# Patient Record
Sex: Female | Born: 1935
Health system: Southern US, Community
[De-identification: ages and names within clinical notes are randomized; demographics above are authoritative.]

## PROBLEM LIST (undated history)

## (undated) DIAGNOSIS — N840 Polyp of corpus uteri: Secondary | ICD-10-CM

## (undated) DIAGNOSIS — M170 Bilateral primary osteoarthritis of knee: Secondary | ICD-10-CM

## (undated) DIAGNOSIS — M810 Age-related osteoporosis without current pathological fracture: Secondary | ICD-10-CM

## (undated) DIAGNOSIS — I872 Venous insufficiency (chronic) (peripheral): Secondary | ICD-10-CM

## (undated) DIAGNOSIS — E669 Obesity, unspecified: Secondary | ICD-10-CM

## (undated) DIAGNOSIS — I10 Essential (primary) hypertension: Secondary | ICD-10-CM

## (undated) DIAGNOSIS — E039 Hypothyroidism, unspecified: Secondary | ICD-10-CM

## (undated) DIAGNOSIS — J309 Allergic rhinitis, unspecified: Secondary | ICD-10-CM

## (undated) DIAGNOSIS — F419 Anxiety disorder, unspecified: Secondary | ICD-10-CM

## (undated) DIAGNOSIS — M21619 Bunion of unspecified foot: Secondary | ICD-10-CM

## (undated) DIAGNOSIS — H9190 Unspecified hearing loss, unspecified ear: Secondary | ICD-10-CM

## (undated) DIAGNOSIS — E7800 Pure hypercholesterolemia, unspecified: Secondary | ICD-10-CM

## (undated) DIAGNOSIS — M5090 Cervical disc disorder, unspecified, unspecified cervical region: Secondary | ICD-10-CM

## (undated) DIAGNOSIS — I38 Endocarditis, valve unspecified: Secondary | ICD-10-CM

## (undated) HISTORY — DX: Bunion of unspecified foot: M21.619

## (undated) HISTORY — DX: Venous insufficiency (chronic) (peripheral): I87.2

## (undated) HISTORY — DX: Unspecified hearing loss, unspecified ear: H91.90

## (undated) HISTORY — DX: Bilateral primary osteoarthritis of knee: M17.0

## (undated) HISTORY — DX: Anxiety disorder, unspecified: F41.9

## (undated) HISTORY — DX: Age-related osteoporosis without current pathological fracture: M81.0

## (undated) HISTORY — DX: Hypothyroidism, unspecified: E03.9

## (undated) HISTORY — DX: Obesity, unspecified: E66.9

## (undated) HISTORY — DX: Pure hypercholesterolemia, unspecified: E78.00

## (undated) HISTORY — DX: Essential (primary) hypertension: I10

## (undated) HISTORY — DX: Endocarditis, valve unspecified: I38

## (undated) HISTORY — DX: Polyp of corpus uteri: N84.0

## (undated) HISTORY — DX: Allergic rhinitis, unspecified: J30.9

## (undated) HISTORY — PX: HYSTEROSCOPY: SHX211

## (undated) HISTORY — DX: Cervical disc disorder, unspecified, unspecified cervical region: M50.90

---

## 1976-05-28 HISTORY — PX: NEPHRECTOMY LIVING DONOR: SUR877

## 1997-09-02 ENCOUNTER — Ambulatory Visit (HOSPITAL_COMMUNITY): Admission: RE | Admit: 1997-09-02 | Discharge: 1997-09-02 | Payer: Self-pay | Admitting: Internal Medicine

## 1997-11-18 ENCOUNTER — Other Ambulatory Visit: Admission: RE | Admit: 1997-11-18 | Discharge: 1997-11-18 | Payer: Self-pay | Admitting: Internal Medicine

## 1997-11-22 ENCOUNTER — Ambulatory Visit (HOSPITAL_COMMUNITY): Admission: RE | Admit: 1997-11-22 | Discharge: 1997-11-22 | Payer: Self-pay | Admitting: Internal Medicine

## 1999-02-08 ENCOUNTER — Ambulatory Visit (HOSPITAL_COMMUNITY): Admission: RE | Admit: 1999-02-08 | Discharge: 1999-02-08 | Payer: Self-pay | Admitting: Internal Medicine

## 1999-02-08 ENCOUNTER — Encounter: Payer: Self-pay | Admitting: Internal Medicine

## 2000-05-10 ENCOUNTER — Ambulatory Visit (HOSPITAL_COMMUNITY): Admission: RE | Admit: 2000-05-10 | Discharge: 2000-05-10 | Payer: Self-pay | Admitting: Internal Medicine

## 2000-05-10 ENCOUNTER — Encounter: Payer: Self-pay | Admitting: Internal Medicine

## 2001-07-01 ENCOUNTER — Encounter: Admission: RE | Admit: 2001-07-01 | Discharge: 2001-07-01 | Payer: Self-pay | Admitting: Internal Medicine

## 2001-07-01 ENCOUNTER — Encounter: Payer: Self-pay | Admitting: Internal Medicine

## 2002-08-04 ENCOUNTER — Other Ambulatory Visit: Admission: RE | Admit: 2002-08-04 | Discharge: 2002-08-04 | Payer: Self-pay | Admitting: Internal Medicine

## 2002-09-02 ENCOUNTER — Encounter: Payer: Self-pay | Admitting: Internal Medicine

## 2002-09-02 ENCOUNTER — Encounter: Admission: RE | Admit: 2002-09-02 | Discharge: 2002-09-02 | Payer: Self-pay | Admitting: Internal Medicine

## 2002-09-22 ENCOUNTER — Encounter: Payer: Self-pay | Admitting: Internal Medicine

## 2002-09-22 ENCOUNTER — Ambulatory Visit (HOSPITAL_COMMUNITY): Admission: RE | Admit: 2002-09-22 | Discharge: 2002-09-22 | Payer: Self-pay | Admitting: Internal Medicine

## 2004-04-04 ENCOUNTER — Other Ambulatory Visit: Admission: RE | Admit: 2004-04-04 | Discharge: 2004-04-04 | Payer: Self-pay | Admitting: Gynecology

## 2004-05-11 ENCOUNTER — Ambulatory Visit (HOSPITAL_COMMUNITY): Admission: RE | Admit: 2004-05-11 | Discharge: 2004-05-11 | Payer: Self-pay | Admitting: Internal Medicine

## 2004-05-19 ENCOUNTER — Encounter: Admission: RE | Admit: 2004-05-19 | Discharge: 2004-05-19 | Payer: Self-pay | Admitting: Internal Medicine

## 2004-08-23 ENCOUNTER — Encounter: Admission: RE | Admit: 2004-08-23 | Discharge: 2004-08-23 | Payer: Self-pay | Admitting: Internal Medicine

## 2005-04-05 ENCOUNTER — Ambulatory Visit (HOSPITAL_COMMUNITY): Admission: RE | Admit: 2005-04-05 | Discharge: 2005-04-05 | Payer: Self-pay | Admitting: Gynecology

## 2005-04-06 ENCOUNTER — Ambulatory Visit (HOSPITAL_COMMUNITY): Admission: RE | Admit: 2005-04-06 | Discharge: 2005-04-06 | Payer: Self-pay | Admitting: Cardiology

## 2005-04-12 ENCOUNTER — Inpatient Hospital Stay (HOSPITAL_BASED_OUTPATIENT_CLINIC_OR_DEPARTMENT_OTHER): Admission: RE | Admit: 2005-04-12 | Discharge: 2005-04-12 | Payer: Self-pay | Admitting: Cardiology

## 2005-05-15 ENCOUNTER — Encounter (INDEPENDENT_AMBULATORY_CARE_PROVIDER_SITE_OTHER): Payer: Self-pay | Admitting: Specialist

## 2005-05-15 ENCOUNTER — Ambulatory Visit (HOSPITAL_COMMUNITY): Admission: RE | Admit: 2005-05-15 | Discharge: 2005-05-15 | Payer: Self-pay | Admitting: Gynecology

## 2005-08-01 ENCOUNTER — Ambulatory Visit (HOSPITAL_COMMUNITY): Admission: RE | Admit: 2005-08-01 | Discharge: 2005-08-01 | Payer: Self-pay | Admitting: Internal Medicine

## 2006-08-06 ENCOUNTER — Ambulatory Visit (HOSPITAL_COMMUNITY): Admission: RE | Admit: 2006-08-06 | Discharge: 2006-08-06 | Payer: Self-pay | Admitting: Internal Medicine

## 2007-05-30 ENCOUNTER — Other Ambulatory Visit: Admission: RE | Admit: 2007-05-30 | Discharge: 2007-05-30 | Payer: Self-pay | Admitting: Gynecology

## 2007-10-16 ENCOUNTER — Ambulatory Visit (HOSPITAL_COMMUNITY): Admission: RE | Admit: 2007-10-16 | Discharge: 2007-10-16 | Payer: Self-pay | Admitting: Internal Medicine

## 2008-04-27 DIAGNOSIS — M8580 Other specified disorders of bone density and structure, unspecified site: Secondary | ICD-10-CM | POA: Insufficient documentation

## 2008-05-03 ENCOUNTER — Ambulatory Visit: Payer: Self-pay | Admitting: Gynecology

## 2008-11-30 ENCOUNTER — Ambulatory Visit (HOSPITAL_COMMUNITY): Admission: RE | Admit: 2008-11-30 | Discharge: 2008-11-30 | Payer: Self-pay | Admitting: Family Medicine

## 2009-01-10 ENCOUNTER — Encounter: Admission: RE | Admit: 2009-01-10 | Discharge: 2009-01-10 | Payer: Self-pay | Admitting: Family Medicine

## 2009-12-14 ENCOUNTER — Ambulatory Visit (HOSPITAL_COMMUNITY): Admission: RE | Admit: 2009-12-14 | Discharge: 2009-12-14 | Payer: Self-pay | Admitting: Family Medicine

## 2010-06-17 ENCOUNTER — Encounter: Payer: Self-pay | Admitting: Internal Medicine

## 2010-06-19 ENCOUNTER — Encounter: Payer: Self-pay | Admitting: Family Medicine

## 2010-10-13 NOTE — Op Note (Signed)
NAME:  Lynn Brown, Lynn Brown                ACCOUNT NO.:  [id number]   MEDICAL RECORD NO.:  [id number]          PATIENT TYPE:  AMB   LOCATION:  SDC                           FACILITY:  [facility]   PHYSICIAN:  Timothy P. Fontaine, M.D.DATE OF BIRTH:  09/03/1931   DATE OF PROCEDURE:  05/15/2005  DATE OF DISCHARGE:                                 OPERATIVE REPORT   PREOPERATIVE DIAGNOSES:  Endometrial polyp.   POSTOPERATIVE DIAGNOSES:  1.  Endometrial polyp.  2.  Submucous myoma x2.   PROCEDURE:  1.  Hysteroscopic polypectomy.  2.  Submucous myomectomy.  3.  Endometrial curettage.   SURGEON:  Dr. Audie Box.   ANESTHETIC:  MAC with paracervical block 1% lidocaine.   SPECIMENS:  1.  Endometrial polyp.  2.  Myoma fragments.  3.  Endometrial curettings.   ESTIMATED BLOOD LOSS:  Minimal.   SORBITOL DISCREPANCY:  Approximately 200 mL.   COMPLICATIONS:  None   FINDINGS:  EUA:  External BUS/vagina with atrophic changes, cervix grossly  normal, uterus grossly normal in size, midline, and mobile.  Adnexa without  masses.  Hysteroscopic:  Large endometrial polyp filling the cavity.  After  removal, 2 small submucous myomas, 1 anterior fundal, 1 posterior fundal,  both excised to the level of the surrounding endometrium.  Hysteroscopy  otherwise revealed an atrophic endometrial pattern.  Fundus, anterior-  posterior uterine surfaces, lower uterine segment, endocervical canal, right  and left tubal ostia all visualized.   PROCEDURE:  The patient was taken to the operating room, underwent IV  sedation, was placed in the low dorsal lithotomy position, received a  perineal vaginal preparation with Betadine solution.  Bladder emptied with  in-and-out Foley catheterization.  EUA performed, and the patient was draped  in the usual fashion.  The cervix was visualized with a speculum, anterior  lip grasped with a single-tooth tenaculum, and a paracervical block using 1%  lidocaine was placed, a total of  10 mL.  Cervix was gently gradually dilated  to admit the operative hysteroscope.  Hysteroscopy was performed with  findings noted above.  The endometrial polyp was then removed through  progressive resection using the right-angle resectoscopic loop.  It was  removed in its entirety.  All fragments were sent to pathology.  Subsequent  hysteroscopy showed 2 small submucous myomas.  These were resected to the  level of the surrounding endometrium.  A sharp curettage was then performed.  There was very scant return.  Rehysteroscopy showed good distension, no  abnormalities, no evidence of perforation.  The instruments were removed,  adequate hemostasis visualized, the patient placed in supine position and  taken to the recovery room in good condition having tolerated the procedure  well.      Timothy P. Fontaine, M.D.  Electronically Signed     TPF/MEDQ  D:  05/15/2005  T:  05/16/2005  Job:  [id number]

## 2010-10-13 NOTE — H&P (Signed)
NAME:  Lynn Brown, Lynn Brown                ACCOUNT NO.:  [id number]   MEDICAL RECORD NO.:  [id number]          PATIENT TYPE:  AMB   LOCATION:  SDC                           FACILITY:  [facility]   PHYSICIAN:  Timothy P. Fontaine, M.D.DATE OF BIRTH:  10-20-1931   DATE OF ADMISSION:  05/15/2005  DATE OF DISCHARGE:                                HISTORY & PHYSICAL   CHIEF COMPLAINT:  Postmenopausal bleeding.   HISTORY OF PRESENT ILLNESS:  A 77 year old who presents with a history of  postmenopausal bleeding.  She underwent outpatient evaluation to include a  sonohystogram which showed a large presumed endometrial polyp, and she is  admitted at this time for hysteroscopic resection, endometrial polyp, D&C.  The patient's history is significant for a history of hypertension,  hypothyroidism, degenerative joint disease, osteoporosis.  She recently  underwent a cardiac evaluation to include a cardiac catheterization for  clearance due to an abnormal stress test and was cleared for surgery by Dr.  Sharyn Lull.   PAST MEDICAL HISTORY:  1.  Hypertension.  2.  Hypothyroidism.  3.  Degenerative joint disease.  4.  Osteoporosis.   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   CURRENT MEDICATIONS:  1.  Toprol-XL 50 mg daily.  2.  Levothroid 150 mcg daily.  3.  Boniva 150 mg monthly.  4.  Triamterene hydrochlorothiazide 12.5 mg twice weekly.  5.  Tranxene 50 mg p.r.n.  6.  Baby aspirin 81 mg daily.   ALLERGIES:  No medications.   FAMILY HISTORY:  Noncontributory.   SOCIAL HISTORY:  Noncontributory.   ADMISSION PHYSICAL EXAMINATION:  VITAL SIGNS:  Afebrile.  Vital signs are  stable.  GENERAL:  Per anesthesia intake specific.  ABDOMEN:  Soft, nontender without masses, guarding, rebound or organomegaly.  PELVIC:  External BUS and vagina with atrophic changes.  Cervix is grossly  normal.  Uterus normal size, midline and mobile.  Nontender.  Adnexa without  masses or tenderness.   ASSESSMENT:  A 77 year old with postmenopausal spotting.  Evaluation shows a  presumed large endometrial polyp, for hysteroscopy, resection of the polyp,  D&C.  The proposed surgery was discussed with the patient and her daughter.  I reviewed with expected intraoperative and postoperative course.  I  reviewed what is involved with the procedure, the use of the resectoscope,  D&C, and the risks of the surgery both in general as far as her  cardiovascular issues, stroke, heart attack, DVT, as well as the specifics  to include uterine perforation, damage to internal organs including bowel,  bladder, ureters, vessels and nerves necessitating major exploratory  reparative surgeries and future reparative surgeries including ostomy  formation.  The risk of transfusion if hemorrhage occurs was reviewed, as  well as the risk of infection, all of which was understood and accepted.  The patient's questions were answered to her satisfaction, and she is ready  to proceed with surgery.      Timothy P. Fontaine, M.D.  Electronically Signed     TPF/MEDQ  D:  05/14/2005  T:  05/14/2005  Job:  [id number]

## 2010-10-13 NOTE — Cardiovascular Report (Signed)
NAME:  Lynn Brown, Lynn Brown                ACCOUNT NO.:  [id number]   MEDICAL RECORD NO.:  [id number]          PATIENT TYPE:  OIB   LOCATION:  1962                         FACILITY:  [facility]   PHYSICIAN:  Mohan N. Sharyn Lull, M.D. DATE OF BIRTH:  [birth year]   DATE OF PROCEDURE:  04/12/2005  DATE OF DISCHARGE:  04/12/2005                              CARDIAC CATHETERIZATION   PROCEDURE:  Left cardiac cath with selective left and right coronary  angiography, left ventriculography, via right groin using Judkins technique.   INDICATIONS FOR PROCEDURE:  Lynn Brown is 77 year old black female with past  medical history significant for hypertension, hypothyroidism, degenerative  joint disease, osteoporosis is referred for preop appearance for  hysterectomy. The patient complains of choking sensation in the chest  associated with palpitation. Denies any lightheadedness or syncope. Denies  nausea, vomiting, diaphoresis. Also complains of feeling weak, tired and  fatigued with minimal exertion. Denies any PND, orthopnea, leg swelling. The  patient underwent Persantine Myoview on April 06, 2005, which showed very  small area of reversible ischemia in the mid segment of the anterior wall  with EF of 70.  Discussed with patient regarding mildly abnormal stress  Myoview and various options of treatment; i.e., medical versus invasive left  cath and consents for the procedure.   PAST MEDICAL HISTORY:  As above.   PAST SURGICAL HISTORY:  She had right nephrectomy secondary to kidney  donation.   ALLERGIES:  NO KNOWN DRUG ALLERGIES.   MEDICATIONS:  1.  She is on Toprol XL 25 mg p.o. daily which was increased to 50 mg p.o.      daily.  2.  Levothroid 150 mcg p.o. daily.  3.  Boniva 150 mg every month.  4.  Triamterene hydrochlorothiazide 12.5 mg twice weekly.  5.  Tranxene 50 mg p.o. p.r.n.  6.  Baby aspirin 81 mg p.o. daily.   SOCIAL HISTORY:  She is widowed, has four children. No history of smoking  or  alcohol abuse. She works at [employer] center.   FAMILY HISTORY:  Father died of stroke. He had also peripheral vascular  disease, he was hypertensive in his 3s. Mother died of stroke. She was  hypertensive at the age of 50. Two brothers are hypertensive, one sister had  MI at the age of 20. She also has a large heart and now on dialysis.   PHYSICAL EXAMINATION:  GENERAL:  She is alert and oriented x3 in no acute  distress.  VITAL SIGNS:  Blood pressure was 140/90, pulse was 78 regular.  HEENT:  Conjunctivae pink.  NECK:  Supple, no JVD, no bruit.  LUNGS:  Clear to auscultation without rhonchi or rales.  CARDIOVASCULAR:  S1 and S2 was normal. There was no S3 gallop.  ABDOMEN:  Abdomen was soft. Bowel sounds were present, nontender  EXTREMITIES:  No clubbing, cyanosis or edema.   IMPRESSION:  1.  Chest pain.  2.  Choking sensation.  3.  Mildly positive Persantine Myoview.  4.  Rule out coronary insufficiency.  5.  Status post palpitation.  6.  Uncontrolled hypertension.  7.  Hypercholesteremia.  8.  Degenerative joint disease.  9.  Osteoporosis.  10. Positive family history of coronary artery disease.   Discussed with the patient at length regarding left cath, its risks and  benefits; i.e., death, stroke, need for emergency CABG, local vascular  complications, need for bare metal stent versus drug-eluting stent in view  of requiring hysterectomy in the recent future, its risks and benefits and  consented for the procedure.   PROCEDURE:  After obtaining informed, the patient was brought to the cath  lab and was placed on fluoroscopy table. Right groin was prepped and draped  in usual fashion. Xylocaine 2% was used for local anesthesia in the right  groin. With the help of thin-wall needle, a 4-French arterial sheath was  placed.  The sheath was aspirated and flushed. Next, 4-French left Judkins  catheter was advanced over the wire under fluoroscopic guidance up to the   ascending aorta. Wire was pulled out, the catheter was aspirated and  connected to the manifold. Catheter was further advanced and engaged into  left coronary ostium. Multiple views of the left system were taken. Next,  the catheter was disengaged and was pulled out over the wire and was  replaced with 4-French right Judkins catheter which was advanced over the  wire under fluoroscopic guidance up to the ascending aorta. Wire was pulled,  out the catheter was aspirated and connected to the manifold. Catheter was  further advanced and engaged into right coronary ostium. Multiple views of  the right system were taken. Next, the catheter was disengaged and was  pulled out over the wire was pulled out and was replaced with 4-French  pigtail catheter which was advanced over the wire under fluoroscopic  guidance up to the ascending aorta. Catheter was further advanced across  aortic valve into the LV.  LV pressures were recorded. Next. Left  ventriculography was done in 30 degree RAO position. Post angiographic  pressures were recorded from LV and then pullback pressures were recorded  from the aortic valve. Next, a pigtail catheter was pulled out over the  wire. Sheaths were aspirated and flushed.   FINDINGS:  LV showed good LV systolic function 55-60%, left main was long  which was patent. LAD has 30-40% ostial stenosis and 15-20% proximal and 20-  30% mid stenosis. Diagonal 1 is very small which is patent. Diagonal 2 is  small which is patent. Left circumflex has 30-40% proximal stenosis. The  vessel is very ectatic proximally.  High OM 1 is very small which is patent.  OM 2 has 10-20% proximal stenosis.  OM 3is very, very small. RCA has 40-50%  ostial and 20-13% proximal stenosis. Patient has __________ TIMI III flow in  all the coronary arteries. The patient did not have any episodes of chest  pain during the procedure. The patient tolerated procedure well. There were no complications. The  patient was transferred to  recovery room in stable condition. The patient should be acceptable risk for  the hysterectomy. The patient will be discharged home later this afternoon  and hopefully will be scheduled for hysterectomy next week.           ______________________________  Eduardo Osier. Sharyn Lull, M.D.     MNH/MEDQ  D:  04/12/2005  T:  04/12/2005  Job:  [id number]   cc:   Dr. August Saucer or Shelle Iron

## 2010-12-04 ENCOUNTER — Other Ambulatory Visit (HOSPITAL_COMMUNITY): Payer: Self-pay | Admitting: Family Medicine

## 2010-12-04 DIAGNOSIS — Z1231 Encounter for screening mammogram for malignant neoplasm of breast: Secondary | ICD-10-CM

## 2010-12-19 ENCOUNTER — Ambulatory Visit (HOSPITAL_COMMUNITY)
Admission: RE | Admit: 2010-12-19 | Discharge: 2010-12-19 | Disposition: A | Discharge status: Home / Self Care | Payer: Medicare Other | Source: Ambulatory Visit | Attending: Family Medicine | Admitting: Family Medicine

## 2010-12-19 DIAGNOSIS — Z1231 Encounter for screening mammogram for malignant neoplasm of breast: Secondary | ICD-10-CM

## 2011-01-01 ENCOUNTER — Encounter: Payer: Self-pay | Admitting: Anesthesiology

## 2011-01-03 ENCOUNTER — Encounter: Payer: Self-pay | Admitting: Gynecology

## 2011-01-03 ENCOUNTER — Ambulatory Visit (INDEPENDENT_AMBULATORY_CARE_PROVIDER_SITE_OTHER): Payer: Medicare Other | Admitting: Gynecology

## 2011-01-03 DIAGNOSIS — I10 Essential (primary) hypertension: Secondary | ICD-10-CM | POA: Insufficient documentation

## 2011-01-03 DIAGNOSIS — M949 Disorder of cartilage, unspecified: Secondary | ICD-10-CM

## 2011-01-03 DIAGNOSIS — N816 Rectocele: Secondary | ICD-10-CM

## 2011-01-03 DIAGNOSIS — M899 Disorder of bone, unspecified: Secondary | ICD-10-CM

## 2011-01-03 DIAGNOSIS — E039 Hypothyroidism, unspecified: Secondary | ICD-10-CM | POA: Insufficient documentation

## 2011-01-03 DIAGNOSIS — E7800 Pure hypercholesterolemia, unspecified: Secondary | ICD-10-CM | POA: Insufficient documentation

## 2011-01-03 DIAGNOSIS — N952 Postmenopausal atrophic vaginitis: Secondary | ICD-10-CM

## 2011-01-03 NOTE — Progress Notes (Signed)
Lynn Brown 1931/09/04 [id number]        77 y.o.  In followup with history of osteopenia.    Past medical history,surgical history, allergies, family history and social history were all reviewed and documented in the EPIC chart. ROS:  Was performed and pertinent positives and negatives are included in the history.  Exam: chaperone present Filed Vitals:   01/03/11 1002  BP: 112/70   General appearance  Normal Skin grossly normal Head/Neck normal with no cervical or supraclavicular adenopathy thyroid normal Lungs  clear Cardiac RR, without RMG Abdominal  soft, nontender, without masses, organomegaly or hernia Breasts  examined lying and sitting without masses, retractions, discharge or axillary adenopathy. Pelvic  Ext/BUS/vagina  normal with atrophic genital changes  Cervix  normal  With atrophic genital changes Pap not done  Uterus  anteverted, normal size, shape and contour, midline and mobile nontender   Adnexa  Without masses or tenderness    Anus and perineum  normal   Rectovaginal  normal sphincter tone without palpated masses or tenderness first degree rectocele noted.   Assessment/Plan:  77 y.o. female for annual exam.    #1 History of osteopenia DEXA 04/2008  -1.8 at the left femoral neck.  DEXA 2005 showed a T score -2.3.  She is on Boniva and has been on this for since 2005.   She is due for DEXA now I scheduled this I recommended she stop her Boniva now we'll plan for a drug-free holiday assuming that her bone density is stable in followup now. Patient agrees with the plan we'll continue with extra calcium vitamin D. #2 Atrophic genital changes with first degree rectocele. She is asymptomatic from this is not having significant incontinence historically and overall is doing well. We'll plan expectant management. I did not do a Pap smear today we discussed the newer screening recommendations she is 52 has no history of significant abnormalities in the past and I do have  several normal Pap smears in the chart we'll stop doing Pap smears. #3 Health maintenance. Self breast exams on a monthly basis discussed urged, had mammogram last week by her history. Colonoscopy July 2011 was normal per her history. She will continued to see her primary for routine health maintenance, no blood work or labs were done today as this is all done through her primary.   Dara Lords MD, 10:56 AM 01/03/2011

## 2011-01-18 ENCOUNTER — Ambulatory Visit (INDEPENDENT_AMBULATORY_CARE_PROVIDER_SITE_OTHER): Payer: Medicare Other | Admitting: Gynecology

## 2011-01-18 DIAGNOSIS — M899 Disorder of bone, unspecified: Secondary | ICD-10-CM

## 2011-01-18 DIAGNOSIS — M949 Disorder of cartilage, unspecified: Secondary | ICD-10-CM

## 2011-01-22 ENCOUNTER — Telehealth: Payer: Self-pay | Admitting: Gynecology

## 2011-01-22 NOTE — Telephone Encounter (Signed)
Tell the patient I think that her bone density overall is stable from her prior exam. As we discussed at her office visit I would recommend stopping her Boniva continue with calcium and vitamin D. And we will repeat her bone density in 2 years.

## 2011-01-22 NOTE — Telephone Encounter (Signed)
PT VOICEMAIL NOT SET UP.

## 2011-01-23 NOTE — Telephone Encounter (Signed)
Pt informed with the below note and will cont. With calcium & vit. d

## 2011-05-29 HISTORY — PX: CATARACT EXTRACTION, BILATERAL: SHX1313

## 2011-12-21 ENCOUNTER — Other Ambulatory Visit: Payer: Self-pay | Admitting: Family Medicine

## 2011-12-21 DIAGNOSIS — Z1231 Encounter for screening mammogram for malignant neoplasm of breast: Secondary | ICD-10-CM

## 2011-12-24 ENCOUNTER — Other Ambulatory Visit (HOSPITAL_COMMUNITY): Payer: Self-pay | Admitting: Cardiology

## 2011-12-24 DIAGNOSIS — R079 Chest pain, unspecified: Secondary | ICD-10-CM

## 2011-12-31 ENCOUNTER — Ambulatory Visit
Admission: RE | Admit: 2011-12-31 | Discharge: 2011-12-31 | Disposition: A | Discharge status: Home / Self Care | Payer: Medicare Other | Source: Ambulatory Visit | Attending: Family Medicine | Admitting: Family Medicine

## 2011-12-31 DIAGNOSIS — Z1231 Encounter for screening mammogram for malignant neoplasm of breast: Secondary | ICD-10-CM

## 2012-01-04 ENCOUNTER — Encounter: Payer: Self-pay | Admitting: Gynecology

## 2012-01-04 ENCOUNTER — Ambulatory Visit (INDEPENDENT_AMBULATORY_CARE_PROVIDER_SITE_OTHER): Payer: Medicare Other | Admitting: Gynecology

## 2012-01-04 VITALS — BP 100/62 | Ht 61.25 in | Wt 176.0 lb

## 2012-01-04 DIAGNOSIS — N8111 Cystocele, midline: Secondary | ICD-10-CM

## 2012-01-04 DIAGNOSIS — N816 Rectocele: Secondary | ICD-10-CM

## 2012-01-04 DIAGNOSIS — N368 Other specified disorders of urethra: Secondary | ICD-10-CM

## 2012-01-04 DIAGNOSIS — M899 Disorder of bone, unspecified: Secondary | ICD-10-CM

## 2012-01-04 DIAGNOSIS — N952 Postmenopausal atrophic vaginitis: Secondary | ICD-10-CM

## 2012-01-04 DIAGNOSIS — M8580 Other specified disorders of bone density and structure, unspecified site: Secondary | ICD-10-CM

## 2012-01-04 NOTE — Patient Instructions (Signed)
Follow up in one year for annual exam 

## 2012-01-04 NOTE — Progress Notes (Signed)
KRISLYNN GRONAU 19-Sep-1931 [id number]        78 y.o.  G5P0014 for follow up exam.  Several issues that are below  Past medical history,surgical history, medications, allergies, family history and social history were all reviewed and documented in the EPIC chart. ROS:  Was performed and pertinent positives and negatives are included in the history.  Exam: Sherrilyn Rist assistant Filed Vitals:   01/04/12 1043  BP: 100/62  Height: 5' 1.25" (1.556 m)  Weight: 176 lb (79.833 kg)   General appearance  Normal Skin grossly normal Head/Neck normal with no cervical or supraclavicular adenopathy thyroid normal Lungs  clear Cardiac RR, without RMG Abdominal  soft, nontender, without masses, organomegaly or hernia Breasts  examined lying and sitting without masses, retractions, discharge or axillary adenopathy. Pelvic  Ext/BUS/vagina  Atrophic changes with mild urethral prolapse. First degree cystocele  Cervix  Atrophic flush with the upper vagina  Uterus  axial, normal size, shape and contour, midline and mobile nontender   Adnexa  Without masses or tenderness    Anus and perineum  normal   Rectovaginal  normal sphincter tone without palpated masses or tenderness. First degree rectocele   Assessment/Plan:  78 y.o. G5P0014 female for follow up exam.   1. Atrophic changes/cystocele/rectocele. Patient asymptomatic and we will continue to follow. 2. Mild urethral prolapse. Patient is asymptomatic, classic in appearance and we will continue to follow. 3. Osteopenia. DEXA 12/2010 shows T score -1.8.  Had been on Boniva but discontinued last year due to stable bone density with planned drug-free holiday. We'll repeat DEXA in another year or 2. 4. Pap smear. Last Pap smear 2009. No history of abnormal Pap smears previously. Per current screening guidelines. Screening issues over 65. 5. Mammography.  Patient had mammogram this month. She'll continue with annual mammography. SBE monthly reviewed. 6. Colonoscopy.  Reports colonoscopy last year. Will follow up per their recommendation. 7. Health maintenance. No blood work was done today as it is all done through her primary physician's office who she sees on a regular basis.  Follow up 1 year, sooner as needed.   Dara Lords MD, 11:07 AM 01/04/2012

## 2012-01-05 LAB — URINALYSIS W MICROSCOPIC + REFLEX CULTURE
Bacteria, UA: NONE SEEN
Bilirubin Urine: NEGATIVE
Casts: NONE SEEN
Crystals: NONE SEEN
Glucose, UA: NEGATIVE mg/dL
Hgb urine dipstick: NEGATIVE
Ketones, ur: NEGATIVE mg/dL
Nitrite: NEGATIVE
Protein, ur: NEGATIVE mg/dL
Specific Gravity, Urine: 1.015 (ref 1.005–1.030)
Squamous Epithelial / HPF: NONE SEEN
Urobilinogen, UA: 0.2 mg/dL (ref 0.0–1.0)
pH: 6.5 (ref 5.0–8.0)

## 2012-01-06 ENCOUNTER — Telehealth: Payer: Self-pay | Admitting: Gynecology

## 2012-01-06 MED ORDER — SULFAMETHOXAZOLE-TMP DS 800-160 MG PO TABS: 1.0000 | ORAL_TABLET | Freq: Two times a day (BID) | ORAL | Status: AC

## 2012-01-06 NOTE — Telephone Encounter (Signed)
Tell patient some bacteria in urine.  Recommend treat with septra ds bid X 3

## 2012-01-07 ENCOUNTER — Other Ambulatory Visit (HOSPITAL_COMMUNITY): Payer: Medicare Other

## 2012-01-07 ENCOUNTER — Ambulatory Visit (HOSPITAL_COMMUNITY): Payer: Medicare Other

## 2012-01-07 NOTE — Telephone Encounter (Signed)
Voicemail not set up, will try back later.

## 2012-01-08 ENCOUNTER — Encounter: Payer: Self-pay | Admitting: Gynecology

## 2012-01-08 LAB — URINE CULTURE: Colony Count: 45000

## 2012-01-08 NOTE — Telephone Encounter (Signed)
Pt voicemail not set up.

## 2012-01-09 NOTE — Telephone Encounter (Signed)
Pt informed with the below note. 

## 2012-01-10 ENCOUNTER — Encounter: Payer: Self-pay | Admitting: Gynecology

## 2012-01-16 ENCOUNTER — Ambulatory Visit (HOSPITAL_COMMUNITY)
Admission: RE | Admit: 2012-01-16 | Discharge: 2012-01-16 | Disposition: A | Discharge status: Home / Self Care | Payer: Medicare Other | Source: Ambulatory Visit | Attending: Cardiology | Admitting: Cardiology

## 2012-01-16 ENCOUNTER — Encounter (HOSPITAL_COMMUNITY)
Admission: RE | Admit: 2012-01-16 | Discharge: 2012-01-16 | Disposition: A | Discharge status: Home / Self Care | Payer: Medicare Other | Source: Ambulatory Visit | Attending: Cardiology | Admitting: Cardiology

## 2012-01-16 ENCOUNTER — Other Ambulatory Visit: Payer: Self-pay

## 2012-01-16 VITALS — BP 115/69 | HR 86

## 2012-01-16 DIAGNOSIS — R079 Chest pain, unspecified: Secondary | ICD-10-CM | POA: Insufficient documentation

## 2012-01-16 MED ORDER — TECHNETIUM TC 99M TETROFOSMIN IV KIT
10.0000 | PACK | Freq: Once | INTRAVENOUS | Status: AC | PRN
  Administered 2012-01-16: 10 via INTRAVENOUS

## 2012-01-16 MED ORDER — REGADENOSON 0.4 MG/5ML IV SOLN
INTRAVENOUS | Status: AC
  Filled 2012-01-16: qty 5

## 2012-01-16 MED ORDER — TECHNETIUM TC 99M TETROFOSMIN IV KIT
30.0000 | PACK | Freq: Once | INTRAVENOUS | Status: AC | PRN
  Administered 2012-01-16: 30 via INTRAVENOUS

## 2012-01-16 MED ORDER — REGADENOSON 0.4 MG/5ML IV SOLN
0.4000 mg | Freq: Once | INTRAVENOUS | Status: AC
  Administered 2012-01-16: 0.4 mg via INTRAVENOUS

## 2013-01-22 ENCOUNTER — Other Ambulatory Visit: Payer: Self-pay | Admitting: Gynecology

## 2013-01-22 DIAGNOSIS — M8580 Other specified disorders of bone density and structure, unspecified site: Secondary | ICD-10-CM

## 2013-01-29 ENCOUNTER — Ambulatory Visit (INDEPENDENT_AMBULATORY_CARE_PROVIDER_SITE_OTHER): Payer: Medicare Other | Admitting: Gynecology

## 2013-01-29 ENCOUNTER — Encounter: Payer: Self-pay | Admitting: Gynecology

## 2013-01-29 VITALS — BP 120/78 | Ht 60.0 in | Wt 165.0 lb

## 2013-01-29 DIAGNOSIS — N952 Postmenopausal atrophic vaginitis: Secondary | ICD-10-CM

## 2013-01-29 DIAGNOSIS — N8111 Cystocele, midline: Secondary | ICD-10-CM

## 2013-01-29 DIAGNOSIS — M8580 Other specified disorders of bone density and structure, unspecified site: Secondary | ICD-10-CM

## 2013-01-29 DIAGNOSIS — M899 Disorder of bone, unspecified: Secondary | ICD-10-CM

## 2013-01-29 DIAGNOSIS — N816 Rectocele: Secondary | ICD-10-CM

## 2013-01-29 NOTE — Progress Notes (Signed)
Lynn Brown [place] 1931/11/12 [id number]        79 y.o.  G5P0014 for followup exam.  Several issues noted below.  Past medical history,surgical history, medications, allergies, family history and social history were all reviewed and documented in the EPIC chart.  ROS:  Performed and pertinent positives and negatives are included in the history, assessment and plan .  Exam: Kim assistant Filed Vitals:   01/29/13 1401  BP: 120/78  Height: 5' (1.524 m)  Weight: 165 lb (74.844 kg)   General appearance  Normal Skin grossly normal Head/Neck normal with no cervical or supraclavicular adenopathy thyroid normal Lungs  clear Cardiac RR, without RMG Abdominal  soft, nontender, without masses, organomegaly or hernia Breasts  examined lying and sitting without masses, retractions, discharge or axillary adenopathy. Pelvic  Ext/BUS/vagina  atrophic changes with second-degree cystocele and second-degree rectocele. Uterus appears well supported. Cervix  normal flush with the upper vagina atrophic in appearance.  Uterus  axial, normal size, shape and contour, midline and mobile nontender   Adnexa  Without masses or tenderness    Anus and perineum  normal   Rectovaginal  normal sphincter tone without palpated masses or tenderness. Confirms second-degree rectocele.   Assessment/Plan:  79 y.o. [id number] female for followup exam.   1. Atrophic genital changes/cystocele/rectocele. Patient is asymptomatic without urinary or bowel movement issues. We'll continue to monitor. Prior appreciated urethral prolapse no longer present. 2. Osteopenia. DEXA 12/2010 with T score -1.8. Had been on Boniva from 2005 through 2012. Is now on a drug-free holiday. Repeat DEXA now and patient has scheduled. Increase calcium vitamin D reviewed. 3. Pap smear 2009. No Pap smear done today. No history of abnormal Pap smears previously. We have elected to stop screening issues at age 57 and she is comfortable with this. 4. Mammography  12/2011. Patient knows to do now. SBE monthly reviewed. 5. Colonoscopy 2011. Repeat at their recommended interval. 6. Health maintenance. No blood work done as this is done through her primary physician's office. Followup for DEXA, otherwise 1 year.  Note: This document was prepared with digital dictation and possible smart phrase technology. Any transcriptional errors that result from this process are unintentional.   Dara Lords MD, 2:23 PM 01/29/2013

## 2013-01-29 NOTE — Patient Instructions (Addendum)
Followup for bone density as scheduled. 

## 2013-02-26 ENCOUNTER — Ambulatory Visit (INDEPENDENT_AMBULATORY_CARE_PROVIDER_SITE_OTHER): Payer: Medicare Other

## 2013-02-26 DIAGNOSIS — M8580 Other specified disorders of bone density and structure, unspecified site: Secondary | ICD-10-CM

## 2013-02-26 DIAGNOSIS — M899 Disorder of bone, unspecified: Secondary | ICD-10-CM

## 2013-03-02 ENCOUNTER — Encounter: Payer: Self-pay | Admitting: Gynecology

## 2013-03-10 ENCOUNTER — Other Ambulatory Visit: Payer: Self-pay | Admitting: *Deleted

## 2013-03-10 DIAGNOSIS — M8580 Other specified disorders of bone density and structure, unspecified site: Secondary | ICD-10-CM

## 2013-03-13 ENCOUNTER — Other Ambulatory Visit: Payer: Self-pay | Admitting: Gynecology

## 2013-03-14 LAB — VITAMIN D 25 HYDROXY (VIT D DEFICIENCY, FRACTURES): Vit D, 25-Hydroxy: 43 ng/mL (ref 30–89)

## 2013-04-07 ENCOUNTER — Encounter (HOSPITAL_COMMUNITY): Payer: Self-pay | Admitting: Emergency Medicine

## 2013-04-07 ENCOUNTER — Emergency Department (HOSPITAL_COMMUNITY)
Admission: EM | Admit: 2013-04-07 | Discharge: 2013-04-07 | Disposition: A | Discharge status: Home / Self Care | Payer: Medicare Other | Attending: Emergency Medicine | Admitting: Emergency Medicine

## 2013-04-07 ENCOUNTER — Emergency Department (HOSPITAL_COMMUNITY): Payer: Medicare Other

## 2013-04-07 DIAGNOSIS — Y929 Unspecified place or not applicable: Secondary | ICD-10-CM | POA: Insufficient documentation

## 2013-04-07 DIAGNOSIS — E039 Hypothyroidism, unspecified: Secondary | ICD-10-CM | POA: Insufficient documentation

## 2013-04-07 DIAGNOSIS — W2209XA Striking against other stationary object, initial encounter: Secondary | ICD-10-CM | POA: Insufficient documentation

## 2013-04-07 DIAGNOSIS — R0789 Other chest pain: Secondary | ICD-10-CM

## 2013-04-07 DIAGNOSIS — I10 Essential (primary) hypertension: Secondary | ICD-10-CM | POA: Insufficient documentation

## 2013-04-07 DIAGNOSIS — Z7982 Long term (current) use of aspirin: Secondary | ICD-10-CM | POA: Insufficient documentation

## 2013-04-07 DIAGNOSIS — E7800 Pure hypercholesterolemia, unspecified: Secondary | ICD-10-CM | POA: Insufficient documentation

## 2013-04-07 DIAGNOSIS — Z79899 Other long term (current) drug therapy: Secondary | ICD-10-CM | POA: Insufficient documentation

## 2013-04-07 DIAGNOSIS — Y9302 Activity, running: Secondary | ICD-10-CM | POA: Insufficient documentation

## 2013-04-07 DIAGNOSIS — M810 Age-related osteoporosis without current pathological fracture: Secondary | ICD-10-CM | POA: Insufficient documentation

## 2013-04-07 DIAGNOSIS — S298XXA Other specified injuries of thorax, initial encounter: Secondary | ICD-10-CM | POA: Insufficient documentation

## 2013-04-07 DIAGNOSIS — F411 Generalized anxiety disorder: Secondary | ICD-10-CM | POA: Insufficient documentation

## 2013-04-07 LAB — CBC
HCT: 37.5 % (ref 36.0–46.0)
Hemoglobin: 12.6 g/dL (ref 12.0–15.0)
MCH: 31.1 pg (ref 26.0–34.0)
MCHC: 33.6 g/dL (ref 30.0–36.0)
MCV: 92.6 fL (ref 78.0–100.0)
Platelets: 202 10*3/uL (ref 150–400)
RBC: 4.05 MIL/uL (ref 3.87–5.11)
RDW: 13.8 % (ref 11.5–15.5)
WBC: 4.1 10*3/uL (ref 4.0–10.5)

## 2013-04-07 LAB — BASIC METABOLIC PANEL
BUN: 27 mg/dL — ABNORMAL HIGH (ref 6–23)
CO2: 30 mEq/L (ref 19–32)
Calcium: 9.7 mg/dL (ref 8.4–10.5)
Chloride: 103 mEq/L (ref 96–112)
Creatinine, Ser: 0.9 mg/dL (ref 0.50–1.10)
GFR calc Af Amer: 69 mL/min — ABNORMAL LOW (ref >90)
GFR calc non Af Amer: 59 mL/min — ABNORMAL LOW (ref >90)
Glucose, Bld: 85 mg/dL (ref 70–99)
Potassium: 4.3 mEq/L (ref 3.5–5.1)
Sodium: 141 mEq/L (ref 135–145)

## 2013-04-07 LAB — POCT I-STAT TROPONIN I: Troponin i, poc: 0.01 ng/mL (ref 0.00–0.08)

## 2013-04-07 LAB — PRO B NATRIURETIC PEPTIDE: Pro B Natriuretic peptide (BNP): 130.3 pg/mL (ref 0–450)

## 2013-04-07 MED ORDER — HYDROCODONE-ACETAMINOPHEN 5-325 MG PO TABS: 1.0000 | ORAL_TABLET | Freq: Four times a day (QID) | ORAL | Status: DC | PRN

## 2013-04-07 MED ORDER — IBUPROFEN 600 MG PO TABS: 600.0000 mg | ORAL_TABLET | Freq: Three times a day (TID) | ORAL | Status: DC | PRN

## 2013-04-07 MED ORDER — IBUPROFEN 400 MG PO TABS
600.0000 mg | ORAL_TABLET | Freq: Once | ORAL | Status: AC
  Administered 2013-04-07: 600 mg via ORAL
  Filled 2013-04-07 (×3): qty 1

## 2013-04-07 NOTE — ED Notes (Signed)
Pt states understanding of discharge instructions 

## 2013-04-07 NOTE — ED Notes (Signed)
Pt c/o midsternal to left sided CP through to back x 3 days; pt sts hit chest in that area several days ago; pt sts pain worse with cough and movement; pt sts some SOB and leg swelling

## 2013-04-07 NOTE — ED Provider Notes (Signed)
CSN: [id number]     Arrival date & time 04/07/13  1746 History   First MD Initiated Contact with Patient 04/07/13 1838     Chief Complaint  Patient presents with  . Chest Pain   (Consider location/radiation/quality/duration/timing/severity/associated sxs/prior Treatment) HPI Patient presents with anterior chest wall soreness and pain with any movement, cough, or palpation.  States she had her cell phone tucked in her bra last week and ran up against a chair by accident, causing a sore area.  It is this area that continues to bother her.  It only hurts when she moves or coughs or touches it.  Denies fevers, chills, SOB, cough, leg swelling, abdominal pain. Pt denies cardiac hx.  She has taken tylenol for the pain without relief.    Past Medical History  Diagnosis Date  . Hypothyroid   . Hypertension   . Osteopenia 02/26/2013    T score -2.2 FRAX 7.2%/2.2%  . Hypercholesteremia   . Anxiety    Past Surgical History  Procedure Laterality Date  . Nephrectomy living donor  72  . Hysteroscopy      D & C   Family History  Problem Relation Age of Onset  . Hypertension Mother   . Diabetes Sister   . Hypertension Sister   . Heart disease Sister   . Hypertension Father   . Cancer Brother     prostrate  . Pancreatic cancer Sister   . Kidney disease Sister    History  Substance Use Topics  . Smoking status: Never Smoker   . Smokeless tobacco: Never Used  . Alcohol Use: No   OB History   Grav Para Term Preterm Abortions TAB SAB Ect Mult Living   5 4   1  1   4      Review of Systems  Constitutional: Negative for fever.  Respiratory: Negative for cough and shortness of breath.   Cardiovascular: Negative for chest pain.  Gastrointestinal: Negative for nausea, vomiting and abdominal pain.  Skin: Negative for color change.  Allergic/Immunologic: Negative for immunocompromised state.  Neurological: Negative for dizziness and light-headedness.    Allergies  Review of  patient's allergies indicates no known allergies.  Home Medications   Current Outpatient Rx  Name  Route  Sig  Dispense  Refill  . aspirin 81 MG tablet   Oral   Take 81 mg by mouth daily.           . calcium citrate-vitamin D (CITRACAL+D) 315-200 MG-UNIT per tablet   Oral   Take 1 tablet by mouth 2 (two) times daily.           . Cholecalciferol (VITAMIN D) 2000 UNITS tablet   Oral   Take 2,000 Units by mouth daily.           Marland Kitchen levothyroxine (SYNTHROID, LEVOTHROID) 100 MCG tablet   Oral   Take 100 mcg by mouth daily.         Marland Kitchen losartan-hydrochlorothiazide (HYZAAR) 100-25 MG per tablet   Oral   Take 1 tablet by mouth daily.           . rosuvastatin (CRESTOR) 20 MG tablet   Oral   Take 20 mg by mouth daily.          BP 98/64  Pulse 79  Temp(Src) 98.5 F (36.9 C) (Oral)  Resp 16  Wt 167 lb 8 oz (75.978 kg)  SpO2 95% Physical Exam  Nursing note and vitals reviewed. Constitutional: She appears well-developed and  well-nourished. No distress.  HENT:  Head: Normocephalic and atraumatic.  Neck: Neck supple.  Cardiovascular: Normal rate and regular rhythm.   Pulmonary/Chest: Effort normal and breath sounds normal. No respiratory distress. She has no wheezes. She has no rales. She exhibits tenderness.    Abdominal: Soft. She exhibits no distension. There is no tenderness. There is no rebound and no guarding.  Neurological: She is alert.  Skin: She is not diaphoretic.    ED Course  Procedures (including critical care time) Labs Review Labs Reviewed  BASIC METABOLIC PANEL - Abnormal; Notable for the following:    BUN 27 (*)    GFR calc non Af Amer 59 (*)    GFR calc Af Amer 69 (*)    All other components within normal limits  CBC  PRO B NATRIURETIC PEPTIDE  POCT I-STAT TROPONIN I   Imaging Review Dg Chest 2 View  04/07/2013   CLINICAL DATA:  Chest pain 1 week.  EXAM: CHEST  2 VIEW  COMPARISON:  05/19/2004  FINDINGS: Lungs are hypoinflated with linear  density over the bases suggesting atelectasis although cannot completely exclude early infection. There is no effusion. Cardiomediastinal silhouette is within normal. There is calcified plaque of the thoracic aorta with tortuosity of the thoracic aorta. Remainder of the exam is unchanged.  IMPRESSION: Mild bibasilar opacification likely atelectasis although cannot exclude infection.   Electronically Signed   By: Elberta Fortis M.D.   On: 04/07/2013 19:05    EKG Interpretation   None      Discussed pt with Dr Juleen China who has also seen the patient.    Date: 04/07/2013  Rate: 77  Rhythm: normal sinus rhythm  QRS Axis: normal  Intervals: normal  ST/T Wave abnormalities: nonspecific ST/T changes  Conduction Disutrbances:none  Narrative Interpretation:   Old EKG Reviewed: none available    MDM   1. Chest wall pain     Pt with chest wall tenderness and pain with palpation and movement x 1 week after collision with chair causing cell phone to push into her chest wall.  Only hurts with movement, coughing, or palpation. CXR negative.  Pt without symptoms of pneumonia.  Troponin negative.  Labs unremarkable. EKG unremarkable, no old EKG for comparison. Discussed results, findings, treatment, and follow up  with patient.  Pt given return precautions.  Pt verbalizes understanding and agrees with plan.    I doubt any other EMC precluding discharge at this time including, but not necessarily limited to the following: ACS, PE, pneumonia      Trixie Dredge, PA-C 04/07/13 2000

## 2013-04-07 NOTE — ED Provider Notes (Signed)
Medical screening examination/treatment/procedure(s) were conducted as a shared visit with non-physician practitioner(s) and myself.  I personally evaluated the patient during the encounter.  EKG Interpretation     Ventricular Rate:  77 PR Interval:  172 QRS Duration: 78 QT Interval:  390 QTC Calculation: 441 R Axis:   -5 Text Interpretation:  Normal sinus rhythm           79 year old female with chest pain. Very atypical for ACS. More likely musculoskeletal, given antecedent injury, reproducibility with palpation and deep inspiration. Plan symptomatic treatment. ER workup has been unremarkable. Return precautions discussed.  Raeford Razor, MD 04/07/13 2013

## 2013-04-07 NOTE — ED Notes (Signed)
West MD at bedside.

## 2013-04-09 NOTE — ED Provider Notes (Signed)
Medical screening examination/treatment/procedure(s) were conducted as a shared visit with non-physician practitioner(s) and myself.  I personally evaluated the patient during the encounter.  EKG Interpretation     Ventricular Rate:  77 PR Interval:  172 QRS Duration: 78 QT Interval:  390 QTC Calculation: 441 R Axis:   -5 Text Interpretation:  Normal sinus rhythm           See other note.   Raeford Razor, MD 04/09/13 1146

## 2013-05-13 ENCOUNTER — Other Ambulatory Visit: Payer: Self-pay

## 2013-05-13 DIAGNOSIS — Z1231 Encounter for screening mammogram for malignant neoplasm of breast: Secondary | ICD-10-CM

## 2013-05-19 ENCOUNTER — Ambulatory Visit
Admission: RE | Admit: 2013-05-19 | Discharge: 2013-05-19 | Disposition: A | Discharge status: Home / Self Care | Payer: Medicare Other | Source: Ambulatory Visit

## 2013-05-19 DIAGNOSIS — Z1231 Encounter for screening mammogram for malignant neoplasm of breast: Secondary | ICD-10-CM

## 2014-03-29 ENCOUNTER — Encounter (HOSPITAL_COMMUNITY): Payer: Self-pay | Admitting: Emergency Medicine

## 2014-05-11 ENCOUNTER — Other Ambulatory Visit: Payer: Self-pay

## 2014-05-11 DIAGNOSIS — Z1231 Encounter for screening mammogram for malignant neoplasm of breast: Secondary | ICD-10-CM

## 2014-05-31 ENCOUNTER — Ambulatory Visit: Payer: Medicare Other

## 2014-07-26 ENCOUNTER — Ambulatory Visit
Admission: RE | Admit: 2014-07-26 | Discharge: 2014-07-26 | Disposition: A | Discharge status: Home / Self Care | Payer: Medicare Other | Source: Ambulatory Visit

## 2014-07-26 ENCOUNTER — Ambulatory Visit: Payer: Self-pay

## 2014-07-26 DIAGNOSIS — Z1231 Encounter for screening mammogram for malignant neoplasm of breast: Secondary | ICD-10-CM

## 2015-12-09 ENCOUNTER — Ambulatory Visit (INDEPENDENT_AMBULATORY_CARE_PROVIDER_SITE_OTHER): Payer: Medicare Other | Admitting: Gynecology

## 2015-12-09 ENCOUNTER — Encounter: Payer: Self-pay | Admitting: Gynecology

## 2015-12-09 VITALS — BP 114/70 | Ht 60.5 in | Wt 164.0 lb

## 2015-12-09 DIAGNOSIS — M8580 Other specified disorders of bone density and structure, unspecified site: Secondary | ICD-10-CM

## 2015-12-09 DIAGNOSIS — Z01419 Encounter for gynecological examination (general) (routine) without abnormal findings: Secondary | ICD-10-CM

## 2015-12-09 DIAGNOSIS — N8111 Cystocele, midline: Secondary | ICD-10-CM

## 2015-12-09 DIAGNOSIS — N952 Postmenopausal atrophic vaginitis: Secondary | ICD-10-CM

## 2015-12-09 NOTE — Patient Instructions (Signed)
Followup for bone density as scheduled. 

## 2015-12-09 NOTE — Progress Notes (Signed)
    Lynn BoomWillie M Brown 12/15/1931 [id number]        82 y.o.  [id number]  for breast and pelvic exam. Several issues noted below.  Past medical history,surgical history, problem list, medications, allergies, family history and social history were all reviewed and documented as reviewed in the EPIC chart.  ROS:  Performed with pertinent positives and negatives included in the history, assessment and plan.   Additional significant findings :  None   Exam: Kennon PortelaKim Gardner assistant Filed Vitals:   12/09/15 1135  BP: 114/70  Height: 5' 0.5" (1.537 m)  Weight: 164 lb (74.39 kg)   General appearance:  Normal affect, orientation and appearance. Skin: Grossly normal HEENT: Without gross lesions.  No cervical or supraclavicular adenopathy. Thyroid normal.  Lungs:  Clear without wheezing, rales or rhonchi Cardiac: RR, without RMG Abdominal:  Soft, nontender, without masses, guarding, rebound, organomegaly or hernia Breasts:  Examined lying and sitting without masses, retractions, discharge or axillary adenopathy. Pelvic:  Ext/BUS/VaginaWith atrophic changes.  Second-degree cystocele, first to second-degree rectocele. Uterus appears well supported.  Cervix flush with upper vagina.  Uterus difficult to palpate but no gross masses or tenderness  Adnexa without gross masses or tenderness    Anus and perineum normal   Rectovaginal normal sphincter tone without palpated masses or tenderness.    Assessment/Plan:  82 y.o. [id number] female for breast and pelvic exam.   1. Postmenopausal/atrophic changes. Without significant hot flushes, night sweats, vaginal dryness or any vaginal bleeding. Continue to monitor report any issues or bleeding. 2. Osteopenia. DEXA 02/2013 T score -2.2 FRAX 17%/2.2%. Recommend DEXA now patient agrees to schedule. 3. Mammography 06/2014. Recommend patient schedule mammogram now and she agrees to do so. SBE monthly reviewed. 4. Colonoscopy 2011. Repeat at their recommended  interval. 5. Pap smear 2009. No Pap smear done today. No history of significant abnormal Pap smears. Per current screening guidelines based on age we both agree to stop screening. 6. Health maintenance. No routine lab work done as patient does this elsewhere. Follow up 1 year, sooner as needed.   Dara LordsFONTAINE,TIMOTHY P MD, 12:03 PM 12/09/2015

## 2015-12-14 ENCOUNTER — Other Ambulatory Visit: Payer: Self-pay | Admitting: Gynecology

## 2015-12-14 DIAGNOSIS — M8580 Other specified disorders of bone density and structure, unspecified site: Secondary | ICD-10-CM

## 2015-12-27 DIAGNOSIS — M810 Age-related osteoporosis without current pathological fracture: Secondary | ICD-10-CM

## 2015-12-27 HISTORY — DX: Age-related osteoporosis without current pathological fracture: M81.0

## 2015-12-29 ENCOUNTER — Ambulatory Visit (INDEPENDENT_AMBULATORY_CARE_PROVIDER_SITE_OTHER): Payer: Medicare Other

## 2015-12-29 ENCOUNTER — Other Ambulatory Visit: Payer: Self-pay | Admitting: Gynecology

## 2015-12-29 DIAGNOSIS — M8580 Other specified disorders of bone density and structure, unspecified site: Secondary | ICD-10-CM

## 2015-12-29 DIAGNOSIS — M810 Age-related osteoporosis without current pathological fracture: Secondary | ICD-10-CM

## 2015-12-30 ENCOUNTER — Telehealth: Payer: Self-pay | Admitting: Gynecology

## 2015-12-30 ENCOUNTER — Encounter: Payer: Self-pay | Admitting: Gynecology

## 2015-12-30 NOTE — Telephone Encounter (Signed)
Pt informed with the below note, transferred to front desk.  

## 2015-12-30 NOTE — Telephone Encounter (Signed)
Tell patient that her bone density shows osteoporosis.  Recommend office visit to discuss treatment options. 

## 2016-01-04 ENCOUNTER — Ambulatory Visit (INDEPENDENT_AMBULATORY_CARE_PROVIDER_SITE_OTHER): Payer: Medicare Other | Admitting: Gynecology

## 2016-01-04 ENCOUNTER — Encounter: Payer: Self-pay | Admitting: Gynecology

## 2016-01-04 VITALS — BP 116/74

## 2016-01-04 DIAGNOSIS — M810 Age-related osteoporosis without current pathological fracture: Secondary | ICD-10-CM

## 2016-01-04 MED ORDER — ALENDRONATE SODIUM 70 MG PO TABS: 70.0000 mg | ORAL_TABLET | ORAL | 11 refills | Status: DC

## 2016-01-04 NOTE — Progress Notes (Signed)
    Lynn Brown 11/08/1931 [id number]        82 y.o.  [id number] presents to discuss her most recent bone density which shows osteoporosis with T score -3.7. Prior measurement -2.2 2014.  Past medical history,surgical history, problem list, medications, allergies, family history and social history were all reviewed and documented in the EPIC chart.  Directed ROS with pertinent positives and negatives documented in the history of present illness/assessment and plan.  Exam: Vitals:   01/04/16 1004  BP: 116/74   General appearance:  Normal   Assessment/Plan:  82 y.o. [id number] Osteoporosis. I reviewed her increased fracture risk and recommendations to begin treatment.  Currently takes 2000 units vitamin D daily.  I reviewed treatment options with my recommendation to initiate alendronate 70 mg weekly. How to take the medication, side effects and risks reviewed. GERD, osteonecrosis of the jaw atypical fractures discussed. Patient's questions were answered and she agrees to go ahead and start the medication. She will call me if she has any issues once starting. Otherwise she will follow up with me in a year.  Greater than 50% of my time was spent in direct face to face counseling and coordination of care with the patient.   Dara LordsFONTAINE,Monesha Monreal P MD, 10:17 AM 01/04/2016

## 2016-01-04 NOTE — Patient Instructions (Signed)
Alendronate tablets  What is this medicine?  ALENDRONATE (a LEN droe nate) slows calcium loss from bones. It helps to make normal healthy bone and to slow bone loss in people with Paget's disease and osteoporosis. It may be used in others at risk for bone loss.  This medicine may be used for other purposes; ask your health care provider or pharmacist if you have questions.  What should I tell my health care provider before I take this medicine?  They need to know if you have any of these conditions:  -dental disease  -esophagus, stomach, or intestine problems, like acid reflux or GERD  -kidney disease  -low blood calcium  -low vitamin D  -problems sitting or standing 30 minutes  -trouble swallowing  -an unusual or allergic reaction to alendronate, other medicines, foods, dyes, or preservatives  -pregnant or trying to get pregnant  -breast-feeding  How should I use this medicine?  You must take this medicine exactly as directed or you will lower the amount of the medicine you absorb into your body or you may cause yourself harm. Take this medicine by mouth first thing in the morning, after you are up for the day. Do not eat or drink anything before you take your medicine. Swallow the tablet with a full glass (6 to 8 fluid ounces) of plain water. Do not take this medicine with any other drink. Do not chew or crush the tablet. After taking this medicine, do not eat breakfast, drink, or take any medicines or vitamins for at least 30 minutes. Sit or stand up for at least 30 minutes after you take this medicine; do not lie down. Do not take your medicine more often than directed.  Talk to your pediatrician regarding the use of this medicine in children. Special care may be needed.  Overdosage: If you think you have taken too much of this medicine contact a poison control center or emergency room at once.  NOTE: This medicine is only for you. Do not share this medicine with others.  What if I miss a dose?  If you miss a  dose, do not take it later in the day. Continue your normal schedule starting the next morning. Do not take double or extra doses.  What may interact with this medicine?  -aluminum hydroxide  -antacids  -aspirin  -calcium supplements  -drugs for inflammation like ibuprofen, naproxen, and others  -iron supplements  -magnesium supplements  -vitamins with minerals  This list may not describe all possible interactions. Give your health care provider a list of all the medicines, herbs, non-prescription drugs, or dietary supplements you use. Also tell them if you smoke, drink alcohol, or use illegal drugs. Some items may interact with your medicine.  What should I watch for while using this medicine?  Visit your doctor or health care professional for regular checks ups. It may be some time before you see benefit from this medicine. Do not stop taking your medicine except on your doctor's advice. Your doctor or health care professional may order blood tests and other tests to see how you are doing.  You should make sure you get enough calcium and vitamin D while you are taking this medicine, unless your doctor tells you not to. Discuss the foods you eat and the vitamins you take with your health care professional.  Some people who take this medicine have severe bone, joint, and/or muscle pain. This medicine may also increase your risk for a broken thigh bone.   Tell your doctor right away if you have pain in your upper leg or groin. Tell your doctor if you have any pain that does not go away or that gets worse.  This medicine can make you more sensitive to the sun. If you get a rash while taking this medicine, sunlight may cause the rash to get worse. Keep out of the sun. If you cannot avoid being in the sun, wear protective clothing and use sunscreen. Do not use sun lamps or tanning beds/booths.  What side effects may I notice from receiving this medicine?  Side effects that you should report to your doctor or health care  professional as soon as possible:  -allergic reactions like skin rash, itching or hives, swelling of the face, lips, or tongue  -black or tarry stools  -bone, muscle or joint pain  -changes in vision  -chest pain  -heartburn or stomach pain  -jaw pain, especially after dental work  -pain or trouble when swallowing  -redness, blistering, peeling or loosening of the skin, including inside the mouth  Side effects that usually do not require medical attention (report to your doctor or health care professional if they continue or are bothersome):  -changes in taste  -diarrhea or constipation  -eye pain or itching  -headache  -nausea or vomiting  -stomach gas or fullness  This list may not describe all possible side effects. Call your doctor for medical advice about side effects. You may report side effects to FDA at 1-800-FDA-1088.  Where should I keep my medicine?  Keep out of the reach of children.  Store at room temperature of 15 and 30 degrees C (59 and 86 degrees F). Throw away any unused medicine after the expiration date.  NOTE: This sheet is a summary. It may not cover all possible information. If you have questions about this medicine, talk to your doctor, pharmacist, or health care provider.      2016, [employer]. (2010-11-10 08:56:09)

## 2016-01-09 ENCOUNTER — Other Ambulatory Visit: Payer: Self-pay | Admitting: Family Medicine

## 2016-01-09 DIAGNOSIS — Z1231 Encounter for screening mammogram for malignant neoplasm of breast: Secondary | ICD-10-CM

## 2016-01-12 ENCOUNTER — Ambulatory Visit
Admission: RE | Admit: 2016-01-12 | Discharge: 2016-01-12 | Disposition: A | Discharge status: Home / Self Care | Payer: Medicare Other | Source: Ambulatory Visit | Attending: Family Medicine | Admitting: Family Medicine

## 2016-01-12 DIAGNOSIS — Z1231 Encounter for screening mammogram for malignant neoplasm of breast: Secondary | ICD-10-CM

## 2016-01-17 ENCOUNTER — Other Ambulatory Visit: Payer: Self-pay | Admitting: Family Medicine

## 2016-01-17 DIAGNOSIS — R928 Other abnormal and inconclusive findings on diagnostic imaging of breast: Secondary | ICD-10-CM

## 2016-01-19 ENCOUNTER — Ambulatory Visit
Admission: RE | Admit: 2016-01-19 | Discharge: 2016-01-19 | Disposition: A | Discharge status: Home / Self Care | Payer: Medicare Other | Source: Ambulatory Visit | Attending: Family Medicine | Admitting: Family Medicine

## 2016-01-19 DIAGNOSIS — R928 Other abnormal and inconclusive findings on diagnostic imaging of breast: Secondary | ICD-10-CM

## 2016-09-26 ENCOUNTER — Other Ambulatory Visit: Payer: Self-pay | Admitting: Cardiology

## 2016-09-26 DIAGNOSIS — R079 Chest pain, unspecified: Secondary | ICD-10-CM

## 2016-10-08 ENCOUNTER — Ambulatory Visit (HOSPITAL_COMMUNITY)
Admission: RE | Admit: 2016-10-08 | Discharge: 2016-10-08 | Disposition: A | Discharge status: Home / Self Care | Payer: Medicare Other | Source: Ambulatory Visit | Attending: Cardiology | Admitting: Cardiology

## 2016-10-08 DIAGNOSIS — R079 Chest pain, unspecified: Secondary | ICD-10-CM | POA: Insufficient documentation

## 2016-10-08 MED ORDER — TECHNETIUM TC 99M MEDRONATE IV KIT: 21.1000 | PACK | Freq: Once | INTRAVENOUS | Status: DC | PRN

## 2016-10-08 MED ORDER — REGADENOSON 0.4 MG/5ML IV SOLN
INTRAVENOUS | Status: AC
  Administered 2016-10-08: 0.4 mg via INTRAVENOUS
  Filled 2016-10-08: qty 5

## 2016-10-08 MED ORDER — REGADENOSON 0.4 MG/5ML IV SOLN
0.4000 mg | Freq: Once | INTRAVENOUS | Status: AC
  Administered 2016-10-08: 0.4 mg via INTRAVENOUS

## 2016-10-08 MED ORDER — TECHNETIUM TC 99M TETROFOSMIN IV KIT
30.0000 | PACK | Freq: Once | INTRAVENOUS | Status: AC | PRN
  Administered 2016-10-08: 30 via INTRAVENOUS

## 2016-10-08 MED ORDER — REGADENOSON 0.4 MG/5ML IV SOLN
INTRAVENOUS | Status: AC
  Filled 2016-10-08: qty 5

## 2016-10-08 MED ORDER — TECHNETIUM TC 99M TETROFOSMIN IV KIT
10.0000 | PACK | Freq: Once | INTRAVENOUS | Status: AC | PRN
  Administered 2016-10-08: 10 via INTRAVENOUS

## 2016-12-08 ENCOUNTER — Emergency Department (HOSPITAL_BASED_OUTPATIENT_CLINIC_OR_DEPARTMENT_OTHER)
Admission: EM | Admit: 2016-12-08 | Discharge: 2016-12-08 | Disposition: A | Discharge status: Home / Self Care | Payer: Medicare Other | Source: Home / Self Care

## 2016-12-08 ENCOUNTER — Emergency Department (HOSPITAL_COMMUNITY)
Admission: EM | Admit: 2016-12-08 | Discharge: 2016-12-08 | Disposition: A | Discharge status: Home / Self Care | Payer: Medicare Other | Attending: Physician Assistant | Admitting: Physician Assistant

## 2016-12-08 ENCOUNTER — Emergency Department (HOSPITAL_COMMUNITY): Payer: Medicare Other

## 2016-12-08 ENCOUNTER — Encounter (HOSPITAL_COMMUNITY): Payer: Self-pay

## 2016-12-08 DIAGNOSIS — M25461 Effusion, right knee: Secondary | ICD-10-CM | POA: Diagnosis not present

## 2016-12-08 DIAGNOSIS — M79609 Pain in unspecified limb: Secondary | ICD-10-CM

## 2016-12-08 DIAGNOSIS — Z79899 Other long term (current) drug therapy: Secondary | ICD-10-CM | POA: Insufficient documentation

## 2016-12-08 DIAGNOSIS — E039 Hypothyroidism, unspecified: Secondary | ICD-10-CM | POA: Insufficient documentation

## 2016-12-08 DIAGNOSIS — M7121 Synovial cyst of popliteal space [Baker], right knee: Secondary | ICD-10-CM | POA: Insufficient documentation

## 2016-12-08 DIAGNOSIS — I10 Essential (primary) hypertension: Secondary | ICD-10-CM | POA: Insufficient documentation

## 2016-12-08 DIAGNOSIS — M79604 Pain in right leg: Secondary | ICD-10-CM | POA: Diagnosis present

## 2016-12-08 NOTE — ED Provider Notes (Signed)
[facility]-EMERGENCY DEPT Provider Note   CSN: [id number] Arrival date & time: 12/08/16  1234     History   Chief Complaint Chief Complaint  Patient presents with  . Leg Pain    R    HPI Lynn Brown is a 83 y.o. female.  HPI 83 year old African American female past medical history significant for hypertension, hyper cholesterolemia that presents to the ED today with complaints of right knee pain that radiates to her right calf and down to her right foot. The patient states that her pain has been ongoing for the past 1-2 weeks. Worse with ambulation. Reports some mild swelling of her right leg. She denies any injury. Denies any fever, redness or warmth. She denies any chest pain or shortness of breath. Nothing makes better. She has not tried nothing for symptoms at home. Past Medical History:  Diagnosis Date  . Anxiety   . Hypercholesteremia   . Hypertension   . Hypothyroid   . Osteoporosis 12/2015   T score -3.7 left hip    Patient Active Problem List   Diagnosis Date Noted  . Hypothyroid   . Hypertension   . Hypercholesteremia   . Osteopenia 04/27/2008    Past Surgical History:  Procedure Laterality Date  . HYSTEROSCOPY     D & C  . NEPHRECTOMY LIVING DONOR  1978    OB History    Gravida Para Term Preterm AB Living   5 4     1 4    SAB TAB Ectopic Multiple Live Births   1               Home Medications    Prior to Admission medications   Medication Sig Start Date End Date Taking? Authorizing Provider  alendronate (FOSAMAX) 70 MG tablet Take 1 tablet (70 mg total) by mouth every 7 (seven) days. Take with a full glass of water on an empty stomach. 01/04/16   Fontaine, Nadyne Coombes, MD  ALPRAZolam Prudy Feeler) 0.5 MG tablet Take 0.5 mg by mouth 3 (three) times daily as needed for anxiety.    [provider]  calcium citrate-vitamin D (CITRACAL+D) 315-200 MG-UNIT per tablet Take 1 tablet by mouth 2 (two) times daily.      [provider]  Cholecalciferol  (VITAMIN D) 2000 UNITS tablet Take 2,000 Units by mouth daily.      [provider]  ibuprofen (ADVIL,MOTRIN) 600 MG tablet Take 1 tablet (600 mg total) by mouth every 8 (eight) hours as needed for mild pain or moderate pain. 04/07/13   Trixie Dredge, PA-C  levothyroxine (SYNTHROID, LEVOTHROID) 100 MCG tablet Take 100 mcg by mouth daily.    [provider]  losartan-hydrochlorothiazide (HYZAAR) 50-12.5 MG per tablet Take 1 tablet by mouth daily.    [provider]  rosuvastatin (CRESTOR) 20 MG tablet Take 20 mg by mouth daily.    [provider]    Family History Family History  Problem Relation Age of Onset  . Hypertension Mother   . Diabetes Sister   . Hypertension Sister   . Heart disease Sister   . Hypertension Father   . Cancer Brother        prostrate  . Pancreatic cancer Sister   . Kidney disease Sister     Social History Social History  Substance Use Topics  . Smoking status: Never Smoker  . Smokeless tobacco: Never Used  . Alcohol use No     Allergies   Patient has no known  allergies.   Review of Systems Review of Systems  Constitutional: Negative for chills and fever.  HENT: Negative for congestion.   Eyes: Negative for visual disturbance.  Respiratory: Negative for cough and shortness of breath.   Cardiovascular: Positive for leg swelling. Negative for chest pain.  Gastrointestinal: Negative for nausea and vomiting.  Musculoskeletal: Positive for gait problem, joint swelling and myalgias. Negative for back pain.  Skin: Negative for rash.  Neurological: Negative for dizziness and headaches.  Psychiatric/Behavioral: Negative for behavioral problems and self-injury.     Physical Exam Updated Vital Signs BP 113/72 (BP Location: Right Arm)   Pulse 62   Temp 98.5 F (36.9 C) (Oral)   Resp 16   Ht 5\' 4"  (1.626 m)   Wt 74.8 kg (165 lb)   SpO2 95%   BMI 28.32 kg/m   Physical Exam  Constitutional: She is oriented to  person, place, and time. She appears well-developed and well-nourished.  Non-toxic appearance. No distress.  HENT:  Head: Normocephalic and atraumatic.  Nose: Nose normal.  Mouth/Throat: Oropharynx is clear and moist.  Eyes: Pupils are equal, round, and reactive to light. Conjunctivae are normal. Right eye exhibits no discharge. Left eye exhibits no discharge.  Neck: Normal range of motion. Neck supple.  Cardiovascular: Normal rate, regular rhythm, normal heart sounds and intact distal pulses.  Exam reveals no gallop and no friction rub.   No murmur heard. Pulmonary/Chest: Effort normal and breath sounds normal. No respiratory distress. She has no wheezes. She has no rales. She exhibits no tenderness.  Abdominal: Soft. Bowel sounds are normal. There is no tenderness. There is no rebound and no guarding.  Musculoskeletal: Normal range of motion. She exhibits no tenderness.  Edema of the right lower extremity that is nonpitting. Range of motion causes pain of the right knee. There is no erythema over the right knee. Full range of motion. DP pulses are 2+ bilaterally. Sensation intact. Cap refill normal. She does have some mild tenderness to palpation of the right calf. No obvious ecchymosis, erythema, warmth.  Lymphadenopathy:    She has no cervical adenopathy.  Neurological: She is alert and oriented to person, place, and time.  Skin: Skin is warm and dry. Capillary refill takes less than 2 seconds.  Psychiatric: Her behavior is normal. Judgment and thought content normal.  Nursing note and vitals reviewed.    ED Treatments / Results  Labs (all labs ordered are listed, but only abnormal results are displayed) Labs Reviewed - No data to display  EKG  EKG Interpretation None       Radiology Dg Tibia/fibula Right  Result Date: 12/08/2016 CLINICAL DATA:  Right lower leg pain.  No known injury. EXAM: RIGHT TIBIA AND FIBULA - 2 VIEW COMPARISON:  Right knee radiographs obtained at the  same time. FINDINGS: Previously described severe right knee degenerative changes. Atheromatous arterial calcifications. IMPRESSION: Severe right knee degenerative changes.  No acute abnormality. Electronically Signed   By: Beckie Salts M.D.   On: 12/08/2016 13:57   Dg Knee Complete 4 Views Right  Result Date: 12/08/2016 CLINICAL DATA:  Right lower leg pain.  No known injury. EXAM: RIGHT KNEE - COMPLETE 4+ VIEW COMPARISON:  01/10/2009. FINDINGS: Severe medial joint space narrowing with progression. Extensive tricompartmental spur formation, most pronounced involving the patellofemoral compartment. No effusion seen. Atheromatous arterial calcifications. IMPRESSION: Severe tricompartmental degenerative changes.  No acute abnormality. Electronically Signed   By: Beckie Salts M.D.   On: 12/08/2016 13:55  Procedures Procedures (including critical care time)  Medications Ordered in ED Medications - No data to display   Initial Impression / Assessment and Plan / ED Course  I have reviewed the triage vital signs and the nursing notes.  Pertinent labs & imaging results that were available during my care of the patient were reviewed by me and considered in my medical decision making (see chart for details).     Patient resents to the ED with complaints of right leg and knee pain with associated edema. Denies any chest pain or shortness of breath. Denies any fevers. On exam patient does have mild edema of the right lower extremity and pain of the popliteal fossa and calf. Patient is neurovascularly intact. Has been ambulatory. Vital signs are reassuring. Low suspicion for PE. X-ray shows arthritic changes. Ultrasounds of lower extremity shows an uncomfortable Baker's cyst of the right leg. No DVT was noted. Which she was symptomatic treatment including compression, heat, NSAIDs.   Pt is hemodynamically stable, in NAD, & able to ambulate in the ED. Evaluation does not show pathology that would require  ongoing emergent intervention or inpatient treatment. I explained the diagnosis to the patient. Pain has been managed & has no complaints prior to dc. Pt is comfortable with above plan and is stable for discharge at this time. All questions were answered prior to disposition. Strict return precautions for f/u to the ED were discussed. Encouraged follow up with PCP. Pt seen and eval by my attending Dr. Corlis LeakMackuen who is agreeable to the above plan.   Final Clinical Impressions(s) / ED Diagnoses   Final diagnoses:  Baker's cyst of knee, right    New Prescriptions Discharge Medication List as of 12/08/2016  8:45 PM       Rise MuLeaphart, Meara Wiechman T, PA-C 12/08/16 2336    Mackuen, Cindee Saltourteney Lyn, MD 12/09/16 2320

## 2016-12-08 NOTE — Discharge Instructions (Signed)
Your ultrasound showed no signs of blood clot. He did have signs of a Baker's cyst. Make sure you're compressing the area with Ace wrap. Warm compresses. Elevate the right leg. Follow-up with her primary care doctor in 1 week and they can refer you to orthopedic if symptoms not improving. Return to the ED for any worsening symptoms.

## 2016-12-08 NOTE — Progress Notes (Signed)
VASCULAR LAB PRELIMINARY  PRELIMINARY  PRELIMINARY  PRELIMINARY  Right lower extremity venous duplex completed.    Preliminary report:  There is no DVT or SVT noted in the right lower extremity. There is a large, intact, Baker's cyst noted in the right popliteal fossa.   Gave report to Azucena Kubayler Leaphart, PA  Keven Osborn, RVT 12/08/2016, 7:57 PM

## 2016-12-08 NOTE — ED Triage Notes (Signed)
Pt reports pain in the back of her R leg from the knee down. She also states that the pain radiates through to the front of her leg. She denies injury, heat, or swelling. A&Ox4. Ambulatory with pain.

## 2016-12-24 ENCOUNTER — Other Ambulatory Visit: Payer: Self-pay | Admitting: Family Medicine

## 2016-12-24 DIAGNOSIS — Z1239 Encounter for other screening for malignant neoplasm of breast: Secondary | ICD-10-CM

## 2017-01-15 ENCOUNTER — Ambulatory Visit
Admission: RE | Admit: 2017-01-15 | Discharge: 2017-01-15 | Disposition: A | Discharge status: Home / Self Care | Payer: Medicare Other | Source: Ambulatory Visit | Attending: Family Medicine | Admitting: Family Medicine

## 2017-01-15 DIAGNOSIS — Z1239 Encounter for other screening for malignant neoplasm of breast: Secondary | ICD-10-CM

## 2017-06-01 ENCOUNTER — Emergency Department (HOSPITAL_COMMUNITY)
Admission: EM | Admit: 2017-06-01 | Discharge: 2017-06-01 | Disposition: A | Discharge status: Home / Self Care | Payer: Medicare Other | Attending: Emergency Medicine | Admitting: Emergency Medicine

## 2017-06-01 ENCOUNTER — Emergency Department (HOSPITAL_COMMUNITY): Payer: Medicare Other

## 2017-06-01 ENCOUNTER — Other Ambulatory Visit: Payer: Self-pay

## 2017-06-01 DIAGNOSIS — R2241 Localized swelling, mass and lump, right lower limb: Secondary | ICD-10-CM | POA: Diagnosis not present

## 2017-06-01 DIAGNOSIS — I10 Essential (primary) hypertension: Secondary | ICD-10-CM | POA: Diagnosis not present

## 2017-06-01 DIAGNOSIS — E039 Hypothyroidism, unspecified: Secondary | ICD-10-CM | POA: Diagnosis not present

## 2017-06-01 DIAGNOSIS — N390 Urinary tract infection, site not specified: Secondary | ICD-10-CM | POA: Insufficient documentation

## 2017-06-01 DIAGNOSIS — Z79899 Other long term (current) drug therapy: Secondary | ICD-10-CM | POA: Diagnosis not present

## 2017-06-01 DIAGNOSIS — R2242 Localized swelling, mass and lump, left lower limb: Secondary | ICD-10-CM | POA: Diagnosis present

## 2017-06-01 DIAGNOSIS — R609 Edema, unspecified: Secondary | ICD-10-CM

## 2017-06-01 DIAGNOSIS — R600 Localized edema: Secondary | ICD-10-CM | POA: Insufficient documentation

## 2017-06-01 LAB — CBC WITH DIFFERENTIAL/PLATELET
Basophils Absolute: 0 10*3/uL (ref 0.0–0.1)
Basophils Relative: 1 %
Eosinophils Absolute: 0.1 10*3/uL (ref 0.0–0.7)
Eosinophils Relative: 2 %
HCT: 40.1 % (ref 36.0–46.0)
Hemoglobin: 13.7 g/dL (ref 12.0–15.0)
Lymphocytes Relative: 53 %
Lymphs Abs: 2.1 10*3/uL (ref 0.7–4.0)
MCH: 31.8 pg (ref 26.0–34.0)
MCHC: 34.2 g/dL (ref 30.0–36.0)
MCV: 93 fL (ref 78.0–100.0)
Monocytes Absolute: 0.2 10*3/uL (ref 0.1–1.0)
Monocytes Relative: 6 %
Neutro Abs: 1.5 10*3/uL — ABNORMAL LOW (ref 1.7–7.7)
Neutrophils Relative %: 38 %
Platelets: 189 10*3/uL (ref 150–400)
RBC: 4.31 MIL/uL (ref 3.87–5.11)
RDW: 14.4 % (ref 11.5–15.5)
WBC: 3.9 10*3/uL — ABNORMAL LOW (ref 4.0–10.5)

## 2017-06-01 LAB — URINALYSIS, ROUTINE W REFLEX MICROSCOPIC
Bacteria, UA: NONE SEEN
Bilirubin Urine: NEGATIVE
Glucose, UA: NEGATIVE mg/dL
Hgb urine dipstick: NEGATIVE
Ketones, ur: NEGATIVE mg/dL
Nitrite: NEGATIVE
Protein, ur: 30 mg/dL — AB
Specific Gravity, Urine: 1.01 (ref 1.005–1.030)
Squamous Epithelial / LPF: NONE SEEN
pH: 6 (ref 5.0–8.0)

## 2017-06-01 LAB — COMPREHENSIVE METABOLIC PANEL
ALT: 35 U/L (ref 14–54)
AST: 34 U/L (ref 15–41)
Albumin: 4.2 g/dL (ref 3.5–5.0)
Alkaline Phosphatase: 61 U/L (ref 38–126)
Anion gap: 8 (ref 5–15)
BUN: 21 mg/dL — ABNORMAL HIGH (ref 6–20)
CO2: 24 mmol/L (ref 22–32)
Calcium: 9.4 mg/dL (ref 8.9–10.3)
Chloride: 107 mmol/L (ref 101–111)
Creatinine, Ser: 1.05 mg/dL — ABNORMAL HIGH (ref 0.44–1.00)
GFR calc Af Amer: 55 mL/min — ABNORMAL LOW (ref >60)
GFR calc non Af Amer: 48 mL/min — ABNORMAL LOW (ref >60)
Glucose, Bld: 83 mg/dL (ref 65–99)
Potassium: 3.8 mmol/L (ref 3.5–5.1)
Sodium: 139 mmol/L (ref 135–145)
Total Bilirubin: 1.3 mg/dL — ABNORMAL HIGH (ref 0.3–1.2)
Total Protein: 7.1 g/dL (ref 6.5–8.1)

## 2017-06-01 LAB — BRAIN NATRIURETIC PEPTIDE: B Natriuretic Peptide: 29.6 pg/mL (ref 0.0–100.0)

## 2017-06-01 MED ORDER — FUROSEMIDE 20 MG PO TABS: 20.0000 mg | ORAL_TABLET | Freq: Every day | ORAL | 0 refills | Status: DC

## 2017-06-01 MED ORDER — CEPHALEXIN 500 MG PO CAPS: 500.0000 mg | ORAL_CAPSULE | Freq: Three times a day (TID) | ORAL | 0 refills | Status: DC

## 2017-06-01 NOTE — ED Provider Notes (Signed)
[facility]-EMERG[facility]Y DEPT Provider Note   CSN: [id number] Arrival date & time: 06/01/17  1708     History   Chief Complaint Chief Complaint  Patient presents with  . Leg Swelling    HPI Lynn Brown is a 84 y.o. female.  HPI   84 year old female with hx of HTN presenting for evaluation of leg swelling. Patient reports she has had bilateral leg swelling for many years. She noticed increased leg swelling with occasional sores on legs and weeping fluid which concerns her. Symptoms more noticeable with past few days. She has been using over-the-counter cream, and tea tree oil on the sores to help prevent it from getting infected.  She noticed her leg because heavier throughout the day and elevation helps. She denies any recent injury. She denies any fever, chills, lightheadedness, dizziness, chest pain, shortness of breath, back pain. She mentioned having a DVT study for her legs in the past most recent was this fall and was negative. She denies any change in medication. States that she has been compliant with her medication as well as been compliant with her diet, avoiding all salty food.     Past Medical History:  Diagnosis Date  . Anxiety   . Hypercholesteremia   . Hypertension   . Hypothyroid   . Osteoporosis 12/2015   T score -3.7 left hip    Patient Active Problem List   Diagnosis Date Noted  . Hypothyroid   . Hypertension   . Hypercholesteremia   . Osteopenia 04/27/2008    Past Surgical History:  Procedure Laterality Date  . HYSTEROSCOPY     D & C  . NEPHRECTOMY LIVING DONOR  1978    OB History    Gravida Para Term Preterm AB Living   5 4     1 4    SAB TAB Ectopic Multiple Live Births   1               Home Medications    Prior to Admission medications   Medication Sig Start Date End Date Taking? Authorizing Provider  alendronate (FOSAMAX) 70 MG tablet Take 1 tablet (70 mg total) by mouth every 7 (seven) days. Take with a full  glass of water on an empty stomach. 01/04/16   Fontaine, Nadyne Coombesimothy P, MD  ALPRAZolam Prudy Feeler(XANAX) 0.5 MG tablet Take 0.5 mg by mouth 3 (three) times daily as needed for anxiety.    [provider]  calcium citrate-vitamin D (CITRACAL+D) 315-200 MG-UNIT per tablet Take 1 tablet by mouth 2 (two) times daily.      [provider]  Cholecalciferol (VITAMIN D) 2000 UNITS tablet Take 2,000 Units by mouth daily.      [provider]  ibuprofen (ADVIL,MOTRIN) 600 MG tablet Take 1 tablet (600 mg total) by mouth every 8 (eight) hours as needed for mild pain or moderate pain. 04/07/13   Trixie DredgeWest, Emily, PA-C  levothyroxine (SYNTHROID, LEVOTHROID) 100 MCG tablet Take 100 mcg by mouth daily.    [provider]  losartan-hydrochlorothiazide (HYZAAR) 50-12.5 MG per tablet Take 1 tablet by mouth daily.    [provider]  rosuvastatin (CRESTOR) 20 MG tablet Take 20 mg by mouth daily.    [provider]    Family History Family History  Problem Relation Age of Onset  . Hypertension Mother   . Diabetes Sister   . Hypertension Sister   . Heart disease Sister   . Hypertension Father   . Cancer  Brother        prostrate  . Pancreatic cancer Sister   . Kidney disease Sister     Social History Social History   Tobacco Use  . Smoking status: Never Smoker  . Smokeless tobacco: Never Used  Substance Use Topics  . Alcohol use: No    Alcohol/week: 0.0 oz  . Drug use: No     Allergies   Patient has no known allergies.   Review of Systems Review of Systems  All other systems reviewed and are negative.    Physical Exam Updated Vital Signs BP 136/88   Pulse 78   Temp 98 F (36.7 C)   Resp 16   SpO2 98%   Physical Exam  Constitutional: She appears well-developed and well-nourished. No distress.  HENT:  Head: Atraumatic.  Eyes: Conjunctivae are normal.  Neck: Neck supple. No JVD present.  Cardiovascular: Normal rate and regular rhythm.    Pulmonary/Chest: Effort normal and breath sounds normal. No stridor. She has no wheezes.  Abdominal: Soft. She exhibits no distension. There is no tenderness.  Musculoskeletal: She exhibits edema (Bilateral 1+ pedal edema extended to the mid tibial region without any erythema or ulceration noted. No weeping. Intact dorsalis pedis pulse bilaterally.).  Neurological: She is alert.  Skin: No rash noted.  Psychiatric: She has a normal mood and affect.  Nursing note and vitals reviewed.    ED Treatments / Results  Labs (all labs ordered are listed, but only abnormal results are displayed) Labs Reviewed  CBC WITH DIFFERENTIAL/PLATELET - Abnormal; Notable for the following components:      Result Value   WBC 3.9 (*)    Neutro Abs 1.5 (*)    All other components within normal limits  COMPREHENSIVE METABOLIC PANEL - Abnormal; Notable for the following components:   BUN 21 (*)    Creatinine, Ser 1.05 (*)    Total Bilirubin 1.3 (*)    GFR calc non Af Amer 48 (*)    GFR calc Af Amer 55 (*)    All other components within normal limits  URINALYSIS, ROUTINE W REFLEX MICROSCOPIC - Abnormal; Notable for the following components:   Color, Urine STRAW (*)    Protein, ur 30 (*)    Leukocytes, UA MODERATE (*)    Crystals PRESENT (*)    All other components within normal limits  URINE CULTURE  BRAIN NATRIURETIC PEPTIDE    EKG  EKG Interpretation None       Radiology Dg Chest 2 View  Result Date: 06/01/2017 CLINICAL DATA:  Bilateral leg swelling. EXAM: CHEST  2 VIEW COMPARISON:  Radiographs 04/07/2013 FINDINGS: Low lung volumes. Heart is normal in size. Atherosclerosis and tortuosity of the thoracic aorta. Mild right greater than left lung base atelectasis. Pulmonary vasculature is normal. No consolidation, pleural effusion, or pneumothorax. No acute osseous abnormalities are seen. Chronic change about the right shoulder. IMPRESSION: 1. No pulmonary edema or evidence of fluid overload. 2.  Aortic atherosclerosis and tortuosity. 3. Mild bibasilar atelectasis. Electronically Signed   By: Rubye Oaks M.D.   On: 06/01/2017 21:31    Procedures Procedures (including critical care time)  Medications Ordered in ED Medications - No data to display   Initial Impression / Assessment and Plan / ED Course  I have reviewed the triage vital signs and the nursing notes.  Pertinent labs & imaging results that were available during my care of the patient were reviewed by me and considered in my medical decision making (see  chart for details).     BP 136/88   Pulse 78   Temp 98 F (36.7 C)   Resp 16   SpO2 98%    Final Clinical Impressions(s) / ED Diagnoses   Final diagnoses:  Peripheral edema  Acute UTI    ED Discharge Orders        Ordered    furosemide (LASIX) 20 MG tablet  Daily     06/01/17 2217    cephALEXin (KEFLEX) 500 MG capsule  3 times daily     06/01/17 2217     9:44 PM Patient here with concerns of skin changes to her lower extremities. She has history of chronic leg swelling. She has no chest pain or trouble breathing at this time. On exam, she does have  peripheral edema to BLE and some hyperpigmented skin changes consistence with chronic venous stasis but no evidence of cellulitis, no calf pain to suggest DVT, no ulceration and no weeping. She is neurovascularly intact. Her chest x-ray is without pleural effusion.  UA shows moderate leukocyte and presence of crystal however pt without urinary sxs.  Her labs are reassuring.    10:19 PM Who was sent in for a urine culture but since patient did report having some urinary urgency within the past week, she will be treated for a suspected urinary tract infection with Keflex. Patient is currently on amlodipine blood pressure medication therefore I will give a short course of Lasix to help her with the leg swelling. She will follow-up closely with her primary care provider for further care. Care discussed with  Dr. Fayrene Fearing. Otherwise patient is stable for discharge.   Fayrene Helper, PA-C 06/01/17 2220    Rolland Porter, MD 06/01/17 2232

## 2017-06-01 NOTE — Discharge Instructions (Signed)
Your urine shows potential for an infection.  Please take antibiotic as prescribed. Take Lasix for your leg swelling.  Follow up closely with your doctor for further care.

## 2017-06-01 NOTE — ED Triage Notes (Signed)
Pt reports swelling bilaterally in both legs. Pt is ambulatory but reports some pain with walking. Pt denies chest pain or SOB. Slight pitting when RN pressed on legs. Pt denies hx of CHF.

## 2017-09-13 ENCOUNTER — Encounter: Payer: Self-pay | Admitting: Interventional Cardiology

## 2017-09-20 ENCOUNTER — Telehealth: Payer: Self-pay | Admitting: Interventional Cardiology

## 2017-09-20 NOTE — Telephone Encounter (Signed)
Walk In pt Form-[facility]. Placed in Dr.Smith doc box.

## 2017-09-25 ENCOUNTER — Ambulatory Visit (HOSPITAL_COMMUNITY): Payer: Medicare Other | Attending: Cardiology

## 2017-09-25 ENCOUNTER — Encounter: Payer: Self-pay | Admitting: Interventional Cardiology

## 2017-09-25 ENCOUNTER — Other Ambulatory Visit: Payer: Self-pay

## 2017-09-25 ENCOUNTER — Ambulatory Visit: Payer: Medicare Other | Admitting: Interventional Cardiology

## 2017-09-25 VITALS — BP 118/70 | HR 73 | Ht 64.0 in | Wt 180.6 lb

## 2017-09-25 DIAGNOSIS — I5032 Chronic diastolic (congestive) heart failure: Secondary | ICD-10-CM | POA: Insufficient documentation

## 2017-09-25 DIAGNOSIS — E7800 Pure hypercholesterolemia, unspecified: Secondary | ICD-10-CM

## 2017-09-25 DIAGNOSIS — I272 Pulmonary hypertension, unspecified: Secondary | ICD-10-CM | POA: Insufficient documentation

## 2017-09-25 DIAGNOSIS — I071 Rheumatic tricuspid insufficiency: Secondary | ICD-10-CM | POA: Diagnosis not present

## 2017-09-25 DIAGNOSIS — I10 Essential (primary) hypertension: Secondary | ICD-10-CM | POA: Diagnosis not present

## 2017-09-25 DIAGNOSIS — R600 Localized edema: Secondary | ICD-10-CM | POA: Diagnosis not present

## 2017-09-25 LAB — ECHOCARDIOGRAM COMPLETE
Height: 64 in
Weight: 2889.6 oz

## 2017-09-25 NOTE — Patient Instructions (Addendum)
Medication Instructions:  1) DISCONTINUE Amlodipine  Labwork: BMET, Liver, CBC and Pro BNP today  Testing/Procedures: Your physician has requested that you have an echocardiogram. Echocardiography is a painless test that uses sound waves to create images of your heart. It provides your doctor with information about the size and shape of your heart and how well your heart's chambers and valves are working. This procedure takes approximately one hour. There are no restrictions for this procedure.   Follow-Up: Your physician recommends that you schedule a follow-up appointment in: 1 month with Dr. Katrinka Blazing. (Can have end of clinic on 6/3)   Any Other Special Instructions Will Be Listed Below (If Applicable).  Please monitor your blood pressure about 2 hours after you take your morning medication.  Your goal blood pressure is 130/80.  Please bring these recordings with you to your next appointment.    If you need a refill on your cardiac medications before your next appointment, please call your pharmacy.

## 2017-09-25 NOTE — Progress Notes (Signed)
Cardiology Office Note    Date:  09/25/2017   ID:  Lynn Brown, DOB November 27, 1931, MRN [id number]  PCP:  Harlan Stains, MD  Cardiologist: Sinclair Grooms, MD   Chief Complaint  Patient presents with  . Congestive Heart Failure  . Shortness of Breath    History of Present Illness:  Lynn Brown is a 84 y.o. female with hypertension, prior history of nephrectomy as a kidney donor, hyperlipidemia, chronic lower extremity edema, hyperlipidemia, hypothyroidism, and "leaky heart valve per Dr. Terrence Dupont".  Referred because of labile blood pressure elevation and lower extremity swelling.  Lower extremity swelling is been present for several years.  Her blood pressure has been a known problem for several years.  Swelling has recently been worse.  She is now on a combination of low-dose loop diuretic therapy and Hyzaar 50/12.5 mg/day.  She occasionally has palpitations that can last up to 10 to 15 minutes.  She states her blood pressures can range anywhere from 213 mmHg systolic to 086 mmHg.  She records her blood pressure multiple times each day.  She has occasional tightness in her chest.  This is poorly characterized.  She has family history of a heart problem, and her sister.  She does not know any details.  She is not diabetic, does not smoke, no history of heart failure or rheumatic fever.  Has been told that she has a leaky heart valve by Dr. Terrence Dupont.  Past Medical History:  Diagnosis Date  . Allergic rhinitis   . Anxiety   . Bunion    PES PLANUS  . Cervical disc disease   . Hearing loss    HEARING AIDS, BUT DOES NOT WEAR THEM CONSISTENTLY.   Marland Kitchen Hypercholesteremia   . Hypertension   . Hypothyroid   . Leaky heart valve    DR. HARWANI  . Mild obesity   . Osteoarthritis of knees, bilateral   . Osteopenia 04/27/2008   -1.8   . Osteoporosis 12/2015   T score -3.7 left hip  . Uterine polyp    H/O WITH BLEEDING---DR. FONTAINE  . Venous insufficiency     Past Surgical History:    Procedure Laterality Date  . CATARACT EXTRACTION, BILATERAL  2013   DR. SHAPIRO   . HYSTEROSCOPY     D & C  . NEPHRECTOMY LIVING DONOR  1978    Current Medications: Outpatient Medications Prior to Visit  Medication Sig Dispense Refill  . alendronate (FOSAMAX) 70 MG tablet Take 1 tablet (70 mg total) by mouth every 7 (seven) days. Take with a full glass of water on an empty stomach. 4 tablet 11  . ALPRAZolam (XANAX) 0.5 MG tablet Take 0.5 mg by mouth 3 (three) times daily as needed for anxiety.    . calcium citrate-vitamin D (CITRACAL+D) 315-200 MG-UNIT per tablet Take 1 tablet by mouth 2 (two) times daily.      . cephALEXin (KEFLEX) 500 MG capsule Take 1 capsule (500 mg total) by mouth 3 (three) times daily. 21 capsule 0  . Cholecalciferol (VITAMIN D) 2000 UNITS tablet Take 2,000 Units by mouth daily.      . furosemide (LASIX) 20 MG tablet Take 1 tablet (20 mg total) by mouth daily. 5 tablet 0  . ibuprofen (ADVIL,MOTRIN) 600 MG tablet Take 1 tablet (600 mg total) by mouth every 8 (eight) hours as needed for mild pain or moderate pain. 15 tablet 0  . levothyroxine (SYNTHROID, LEVOTHROID) 112 MCG tablet Take 112 mcg by  mouth daily before breakfast.  3  . losartan-hydrochlorothiazide (HYZAAR) 50-12.5 MG per tablet Take 1 tablet by mouth daily.    . potassium chloride (K-DUR) 10 MEQ tablet Take 10 mEq by mouth daily.  3  . rosuvastatin (CRESTOR) 20 MG tablet Take 20 mg by mouth daily.    Marland Kitchen amLODipine (NORVASC) 5 MG tablet Take 5 mg by mouth daily.  5  . levothyroxine (SYNTHROID, LEVOTHROID) 100 MCG tablet Take 100 mcg by mouth daily.     No facility-administered medications prior to visit.      Allergies:   Patient has no known allergies.   Social History   Socioeconomic History  . Marital status: Widowed    Spouse name: Not on file  . Number of children: 4  . Years of education: Not on file  . Highest education level: Not on file  Occupational History  . Occupation: RETIRED   Social Needs  . Financial resource strain: Not on file  . Food insecurity:    Worry: Not on file    Inability: Not on file  . Transportation needs:    Medical: Not on file    Non-medical: Not on file  Tobacco Use  . Smoking status: Never Smoker  . Smokeless tobacco: Never Used  Substance and Sexual Activity  . Alcohol use: No    Alcohol/week: 0.0 oz  . Drug use: No  . Sexual activity: Never    Comment: 1st intercourse 84 yo-Fewer than 5 partners  Lifestyle  . Physical activity:    Days per week: Not on file    Minutes per session: Not on file  . Stress: Not on file  Relationships  . Social connections:    Talks on phone: Not on file    Gets together: Not on file    Attends religious service: Not on file    Active member of club or organization: Not on file    Attends meetings of clubs or organizations: Not on file    Relationship status: Not on file  Other Topics Concern  . Not on file  Social History Narrative  . Not on file     Family History:  The patient's family history includes CVA in her brother, father, and mother; Cancer in her brother; Diabetes in her sister; Heart attack in her sister; Heart disease in her sister; Hypertension in her brother, brother, father, mother, and sister; Kidney disease in her sister; Kidney failure in her sister; Multiple sclerosis in her sister; Other in her brother; Pancreatic cancer in her sister; Seizures in her brother.   ROS:   Please see the history of present illness.    Leg swelling is been an intermittent problem.  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  Has constipation, anxiety, skipped heartbeats as noted above, occasional chest pressure, excessive fatigue, hearing loss, shortness of breath, muscle pain, and easy bruising. All other systems reviewed and are negative.   PHYSICAL EXAM:   VS:  BP 118/70   Pulse 73   Ht 5' 4"  (1.626 m)   Wt 180 lb 9.6 oz (81.9 kg)   BMI 31.00 kg/m    GEN:  Well nourished, well developed, in no acute distress  HEENT: normal  Neck: Mild JVD, no carotid bruits, or masses Cardiac: RRR; no murmurs, rubs, or gallops, but bilateral lower extremity edema with lichenification of skin related to chronic edema and skin inflammation Respiratory:  clear to auscultation bilaterally, normal work of breathing GI:  soft, nontender, nondistended, + BS MS: no deformity or atrophy  Skin: warm and dry, no rash Neuro:  Alert and Oriented x 3, Strength and sensation are intact Psych: euthymic mood, full affect  Wt Readings from Last 3 Encounters:  09/25/17 180 lb 9.6 oz (81.9 kg)  12/08/16 165 lb (74.8 kg)  12/09/15 164 lb (74.4 kg)      Studies/Labs Reviewed:   EKG:  EKG tracing performed in January 2018 revealed poor R wave progression, nonspecific T wave abnormality, with indeterminate axis.  No change when compared to prior tracings.  Recent Labs: 06/01/2017: ALT 35; B Natriuretic Peptide 29.6; BUN 21; Creatinine, Ser 1.05; Hemoglobin 13.7; Platelets 189; Potassium 3.8; Sodium 139   Lipid Panel No results found for: CHOL, TRIG, HDL, CHOLHDL, VLDL, LDLCALC, LDLDIRECT  Additional studies/ records that were reviewed today include:  Recent laboratory data from January 2019 reveals a creatinine of 1.05, hemoglobin 13.7, and a BNP from 2014 was 130.    ASSESSMENT:    1. Essential hypertension   2. Chronic diastolic heart failure ([place])   3. Lower extremity edema   4. Hypercholesteremia      PLAN:  In order of problems listed above:  1. Blood pressure control will need to be monitored.  We will discontinue amlodipine as this may be contributing to lower extremity swelling.  If blood pressure becomes unacceptable, we will discontinue furosemide and increase losartan to 100/25 mg daily. 2. Suspect diastolic heart failure.  2D Doppler echocardiogram will be performed to assess LV and RV. 3. Bilateral lower extremity edema, chronic, probably being  contributed to by amlodipine.  Discontinue amlodipine.  Increase losartan HCT if needed.  Be met and BNP will be obtained today as well as CBC.  Did not order a TSH but this also needs to be considered. 4. Not addressed  Instructed to measure her blood pressure 2 to 3 hours after her morning medications each day.  Adjustments will be made although his blood pressure recordings only.  I have asked her not to measure her blood pressures frequently as she has done in the past.  Lab work will be checked.  BNP is being done to exclude obvious heart failure since she has some shortness of breath.  She needs 1 month follow-up.    Medication Adjustments/Labs and Tests Ordered: Current medicines are reviewed at length with the patient today.  Concerns regarding medicines are outlined above.  Medication changes, Labs and Tests ordered today are listed in the Patient Instructions below. Patient Instructions  Medication Instructions:  1) DISCONTINUE Amlodipine  Labwork: BMET, Liver, CBC and Pro BNP today  Testing/Procedures: Your physician has requested that you have an echocardiogram. Echocardiography is a painless test that uses sound waves to create images of your heart. It provides your doctor with information about the size and shape of your heart and how well your heart's chambers and valves are working. This procedure takes approximately one hour. There are no restrictions for this procedure.   Follow-Up: Your physician recommends that you schedule a follow-up appointment in: 1 month with Dr. Tamala Julian. (Can have end of clinic on 6/3)   Any Other Special Instructions Will Be Listed Below (If Applicable).  Please monitor your blood pressure about 2 hours after you take your morning medication.  Your goal blood pressure is 130/80.  Please bring these recordings with you to your next appointment.    If you need a refill on your cardiac medications before your next appointment, please  call your  pharmacy.      Signed, Sinclair Grooms, MD  09/25/2017 9:46 AM    [facility] [address], [place], [place]  [address] Phone: [phone]; Fax: [phone]

## 2017-09-26 LAB — PRO B NATRIURETIC PEPTIDE: NT-Pro BNP: 43 pg/mL (ref 0–738)

## 2017-09-26 LAB — BASIC METABOLIC PANEL
BUN/Creatinine Ratio: 26 (ref 12–28)
BUN: 25 mg/dL (ref 8–27)
CO2: 25 mmol/L (ref 20–29)
Calcium: 9.6 mg/dL (ref 8.7–10.3)
Chloride: 102 mmol/L (ref 96–106)
Creatinine, Ser: 0.97 mg/dL (ref 0.57–1.00)
GFR calc Af Amer: 62 mL/min/{1.73_m2} (ref >59)
GFR calc non Af Amer: 54 mL/min/{1.73_m2} — ABNORMAL LOW (ref >59)
Glucose: 106 mg/dL — ABNORMAL HIGH (ref 65–99)
Potassium: 4.2 mmol/L (ref 3.5–5.2)
Sodium: 142 mmol/L (ref 134–144)

## 2017-09-26 LAB — CBC
Hematocrit: 37.8 % (ref 34.0–46.6)
Hemoglobin: 12.8 g/dL (ref 11.1–15.9)
MCH: 32.1 pg (ref 26.6–33.0)
MCHC: 33.9 g/dL (ref 31.5–35.7)
MCV: 95 fL (ref 79–97)
Platelets: 180 10*3/uL (ref 150–379)
RBC: 3.99 x10E6/uL (ref 3.77–5.28)
RDW: 14 % (ref 12.3–15.4)
WBC: 3.1 10*3/uL — ABNORMAL LOW (ref 3.4–10.8)

## 2017-09-26 LAB — HEPATIC FUNCTION PANEL
ALT: 15 IU/L (ref 0–32)
AST: 14 IU/L (ref 0–40)
Albumin: 4.5 g/dL (ref 3.5–4.7)
Alkaline Phosphatase: 68 IU/L (ref 39–117)
Bilirubin Total: 0.5 mg/dL (ref 0.0–1.2)
Bilirubin, Direct: 0.12 mg/dL (ref 0.00–0.40)
Total Protein: 7.3 g/dL (ref 6.0–8.5)

## 2017-09-27 ENCOUNTER — Other Ambulatory Visit: Payer: Self-pay | Admitting: Interventional Cardiology

## 2017-09-27 MED ORDER — LOSARTAN POTASSIUM-HCTZ 50-12.5 MG PO TABS: 1.0000 | ORAL_TABLET | Freq: Every day | ORAL | 3 refills | Status: DC

## 2017-09-27 NOTE — Telephone Encounter (Signed)
°*  STAT* If patient is at the pharmacy, call can be transferred to refill team.   1. Which medications need to be refilled? (please list name of each medication and dose if known) Losartan  2. Which pharmacy/location (including street and city if local pharmacy) is medication to be sent to?[facility] [phone]  3. Do they need a 30 day or 90 day supply? 90  And refills

## 2017-09-27 NOTE — Telephone Encounter (Signed)
Pt's medication was sent to pt's pharmacy as requested. Confirmation received.  °

## 2017-10-27 NOTE — Progress Notes (Signed)
Cardiology Office Note    Date:  10/28/2017   ID:  Lynn Brown, DOB September 11, 1931, MRN [id number]  PCP:  Lynn Montana, MD  Cardiologist: Lynn Noe, MD   Chief Complaint  Patient presents with  . Hypertension  . Leg Swelling    History of Present Illness:  Lynn Brown is a 84 y.o. female with hypertension, prior history of nephrectomy as a kidney donor, hyperlipidemia, chronic lower extremity edema, hyperlipidemia, hypothyroidism, and "leaky heart valve per Dr. Sharyn Brown".  Referred because of labile blood pressure elevation and lower extremity swelling.  The lower extremity edema is improved.  Denies dyspnea.  Still trying to regulate/dose her medications based on blood pressure information that she gathers at home.  We discussed cardiac work-up including the echo which is essentially normal.  She has diastolic dysfunction which is not surprising at her age.  There is mild pulmonary hypertension.  Past Medical History:  Diagnosis Date  . Allergic rhinitis   . Anxiety   . Bunion    PES PLANUS  . Cervical disc disease   . Hearing loss    HEARING AIDS, BUT DOES NOT WEAR THEM CONSISTENTLY.   Marland Kitchen Hypercholesteremia   . Hypertension   . Hypothyroid   . Leaky heart valve    DR. HARWANI  . Mild obesity   . Osteoarthritis of knees, bilateral   . Osteopenia 04/27/2008   -1.8   . Osteoporosis 12/2015   T score -3.7 left hip  . Uterine polyp    H/O WITH BLEEDING---DR. FONTAINE  . Venous insufficiency     Past Surgical History:  Procedure Laterality Date  . CATARACT EXTRACTION, BILATERAL  2013   DR. SHAPIRO   . HYSTEROSCOPY     D & C  . NEPHRECTOMY LIVING DONOR  1978    Current Medications: Outpatient Medications Prior to Visit  Medication Sig Dispense Refill  . ALPRAZolam (XANAX) 0.5 MG tablet Take 0.5 mg by mouth 3 (three) times daily as needed for anxiety.    . calcium citrate-vitamin D (CITRACAL+D) 315-200 MG-UNIT per tablet Take 1 tablet by mouth 2 (two)  times daily.      . Cholecalciferol (VITAMIN D) 2000 UNITS tablet Take 2,000 Units by mouth daily.      Marland Kitchen levothyroxine (SYNTHROID, LEVOTHROID) 112 MCG tablet Take 112 mcg by mouth daily before breakfast.  3  . losartan-hydrochlorothiazide (HYZAAR) 50-12.5 MG tablet Take 1 tablet by mouth daily. 90 tablet 3  . rosuvastatin (CRESTOR) 20 MG tablet Take 20 mg by mouth daily.    . furosemide (LASIX) 20 MG tablet Take 1 tablet (20 mg total) by mouth daily. 5 tablet 0  . potassium chloride (K-DUR) 10 MEQ tablet Take 10 mEq by mouth daily.  3  . alendronate (FOSAMAX) 70 MG tablet Take 1 tablet (70 mg total) by mouth every 7 (seven) days. Take with a full glass of water on an empty stomach. (Patient not taking: Reported on 10/28/2017) 4 tablet 11  . cephALEXin (KEFLEX) 500 MG capsule Take 1 capsule (500 mg total) by mouth 3 (three) times daily. (Patient not taking: Reported on 10/28/2017) 21 capsule 0  . ibuprofen (ADVIL,MOTRIN) 600 MG tablet Take 1 tablet (600 mg total) by mouth every 8 (eight) hours as needed for mild pain or moderate pain. (Patient not taking: Reported on 10/28/2017) 15 tablet 0   No facility-administered medications prior to visit.      Allergies:   Patient has no known allergies.  Social History   Socioeconomic History  . Marital status: Widowed    Spouse name: Not on file  . Number of children: 4  . Years of education: Not on file  . Highest education level: Not on file  Occupational History  . Occupation: RETIRED  Social Needs  . Financial resource strain: Not on file  . Food insecurity:    Worry: Not on file    Inability: Not on file  . Transportation needs:    Medical: Not on file    Non-medical: Not on file  Tobacco Use  . Smoking status: Never Smoker  . Smokeless tobacco: Never Used  Substance and Sexual Activity  . Alcohol use: No    Alcohol/week: 0.0 oz  . Drug use: No  . Sexual activity: Never    Comment: 1st intercourse 84 yo-Fewer than 5 partners    Lifestyle  . Physical activity:    Days per week: Not on file    Minutes per session: Not on file  . Stress: Not on file  Relationships  . Social connections:    Talks on phone: Not on file    Gets together: Not on file    Attends religious service: Not on file    Active member of club or organization: Not on file    Attends meetings of clubs or organizations: Not on file    Relationship status: Not on file  Other Topics Concern  . Not on file  Social History Narrative  . Not on file     Family History:  The patient's  family history includes CVA in her brother, father, and mother; Cancer in her brother; Diabetes in her sister; Heart attack in her sister; Heart disease in her sister; Hypertension in her brother, brother, father, mother, and sister; Kidney disease in her sister; Kidney failure in her sister; Multiple sclerosis in her sister; Other in her brother; Pancreatic cancer in her sister; Seizures in her brother.   ROS:   Please see the history of present illness.    Decrease leg swelling with trace to 1+ edema left lower extremity none in right lower extremity.  Decreased hearing, constipation, otherwise no complaints.  We had a frank conversation concerning her medications.  She takes furosemide nearly every day but may decide to take it at different times depending upon her schedule because it causes frequent urination.  Hyzaar she takes based on what her blood pressure is.  If the blood pressure is really high she will take the Hyzaar otherwise she passes on it. All other systems reviewed and are negative.   PHYSICAL EXAM:   VS:  BP 132/84   Pulse 69   Ht 5\' 4"  (1.626 m)   Wt 178 lb 6.4 oz (80.9 kg)   BMI 30.62 kg/m    GEN: Well nourished, well developed, in no acute distress  HEENT: normal  Neck: no JVD, carotid bruits, or masses Cardiac: RRR; no murmurs, rubs, or gallops,no edema  Respiratory:  clear to auscultation bilaterally, normal work of breathing GI: soft,  nontender, nondistended, + BS MS: no deformity or atrophy  Skin: warm and dry, no rash Neuro:  Alert and Oriented x 3, Strength and sensation are intact Psych: euthymic mood, full affect  Wt Readings from Last 3 Encounters:  10/28/17 178 lb 6.4 oz (80.9 kg)  09/25/17 180 lb 9.6 oz (81.9 kg)  12/08/16 165 lb (74.8 kg)      Studies/Labs Reviewed:   EKG:  EKG  None  Recent Labs: 06/01/2017: B Natriuretic Peptide 29.6 09/25/2017: ALT 15; BUN 25; Creatinine, Ser 0.97; Hemoglobin 12.8; NT-Pro BNP 43; Platelets 180; Potassium 4.2; Sodium 142   Lipid Panel No results found for: CHOL, TRIG, HDL, CHOLHDL, VLDL, LDLCALC, LDLDIRECT  Additional studies/ records that were reviewed today include:  None    ASSESSMENT:    1. Chronic diastolic heart failure (HCC)   2. Essential hypertension   3. Hypercholesteremia      PLAN:  In order of problems listed above:  1. She stable without volume overload.  I believe we should discontinue furosemide and potassium.  Use Hyzaar 50/12.5 mg daily.  Check a basic metabolic panel in 2 to 3 weeks and have follow-up blood pressures recorded by the patient. 2. Blood pressure clinic in 3 weeks.  Have consolidated her medication regimen.  On the last office visit we discontinued amlodipine 10 mg/day.  I asked her to take Hyzaar 50/12.5 mg daily.  She is done this intermittently.  She is basing dosing on blood pressures that she obtains first thing in the morning.  We have decided today that furosemide, and potassium supplements will be discontinued and that she will take Hyzaar every day.  Record blood pressure no sooner than 2 hours post a.m. dose.  Blood work in 2 to 3 weeks and BP clinic follow-up.  If doing okay blood pressure wise she can be referred back to her primary physician.    Medication Adjustments/Labs and Tests Ordered: Current medicines are reviewed at length with the patient today.  Concerns regarding medicines are outlined above.  Medication  changes, Labs and Tests ordered today are listed in the Patient Instructions below. Patient Instructions  Medication Instructions:  1) DISCONTINUE Furosemide 2) DISCONTINUE Potassium 3) Make sure you take your Losartan/HCTZ once daily  Labwork: Your physician recommends that you return for lab work in: 3 weeks (BMET- same day as HTN clinic appointment).   Testing/Procedures: None  Follow-Up: Your physician recommends that you schedule a follow-up appointment next available (6/25) with the Hypertension Clinic.    Any Other Special Instructions Will Be Listed Below (If Applicable).     If you need a refill on your cardiac medications before your next appointment, please call your pharmacy.      Signed, Lynn Noe, MD  10/28/2017 11:25 AM    [facility] [address] [place], [place], [place]  [address] Phone: [phone]; Fax: [phone]

## 2017-10-28 ENCOUNTER — Encounter: Payer: Self-pay | Admitting: Interventional Cardiology

## 2017-10-28 ENCOUNTER — Ambulatory Visit: Payer: Medicare Other | Admitting: Interventional Cardiology

## 2017-10-28 VITALS — BP 132/84 | HR 69 | Ht 64.0 in | Wt 178.4 lb

## 2017-10-28 DIAGNOSIS — I5032 Chronic diastolic (congestive) heart failure: Secondary | ICD-10-CM

## 2017-10-28 DIAGNOSIS — I10 Essential (primary) hypertension: Secondary | ICD-10-CM | POA: Diagnosis not present

## 2017-10-28 DIAGNOSIS — E7800 Pure hypercholesterolemia, unspecified: Secondary | ICD-10-CM | POA: Diagnosis not present

## 2017-10-28 NOTE — Patient Instructions (Addendum)
Medication Instructions:  1) DISCONTINUE Furosemide 2) DISCONTINUE Potassium 3) Make sure you take your Losartan/HCTZ once daily  Labwork: Your physician recommends that you return for lab work in: 3 weeks (BMET- same day as HTN clinic appointment).   Testing/Procedures: None  Follow-Up: Your physician recommends that you schedule a follow-up appointment next available (6/25) with the Hypertension Clinic.   Your physician recommends that you schedule a follow-up appointment as needed with Dr. Katrinka BlazingSmith.    Any Other Special Instructions Will Be Listed Below (If Applicable).     If you need a refill on your cardiac medications before your next appointment, please call your pharmacy.

## 2017-11-21 ENCOUNTER — Ambulatory Visit: Payer: Medicare Other

## 2017-11-21 ENCOUNTER — Other Ambulatory Visit: Payer: Medicare Other

## 2017-12-03 ENCOUNTER — Encounter: Payer: Self-pay | Admitting: Sports Medicine

## 2017-12-03 ENCOUNTER — Ambulatory Visit (INDEPENDENT_AMBULATORY_CARE_PROVIDER_SITE_OTHER): Payer: Medicare Other

## 2017-12-03 ENCOUNTER — Other Ambulatory Visit: Payer: Self-pay

## 2017-12-03 ENCOUNTER — Ambulatory Visit: Payer: Medicare Other | Admitting: Sports Medicine

## 2017-12-03 VITALS — BP 117/73 | HR 64 | Temp 98.7°F | Resp 16 | Ht 60.0 in | Wt 179.0 lb

## 2017-12-03 DIAGNOSIS — M79675 Pain in left toe(s): Secondary | ICD-10-CM | POA: Diagnosis not present

## 2017-12-03 DIAGNOSIS — M2042 Other hammer toe(s) (acquired), left foot: Secondary | ICD-10-CM | POA: Diagnosis not present

## 2017-12-03 DIAGNOSIS — L84 Corns and callosities: Secondary | ICD-10-CM | POA: Diagnosis not present

## 2017-12-03 DIAGNOSIS — M21619 Bunion of unspecified foot: Secondary | ICD-10-CM | POA: Diagnosis not present

## 2017-12-03 NOTE — Progress Notes (Signed)
Subjective: Carl BestWillie M Teston is a 84 y.o. female patient who presents to office for evaluation of Left foot pain secondary to callus skin. Patient complains of pain at the lesion present left 2nd toe medial aspect. Patient has tried trimming and toe spacer with no relief in symptoms. Patient denies any other pedal complaints.  Review of Systems  Musculoskeletal: Positive for joint pain.  Skin:       corn  Neurological: Positive for sensory change.  All other systems reviewed and are negative.     Patient Active Problem List   Diagnosis Date Noted  . Chronic diastolic heart failure (HCC) 09/25/2017  . Hypothyroid   . Essential hypertension   . Hypercholesteremia   . Osteopenia 04/27/2008    Current Outpatient Medications on File Prior to Visit  Medication Sig Dispense Refill  . ALPRAZolam (XANAX) 0.5 MG tablet Take 0.5 mg by mouth 3 (three) times daily as needed for anxiety.    . calcium citrate-vitamin D (CITRACAL+D) 315-200 MG-UNIT per tablet Take 1 tablet by mouth 2 (two) times daily.      . Cholecalciferol (VITAMIN D) 2000 UNITS tablet Take 2,000 Units by mouth daily.      Marland Kitchen. losartan-hydrochlorothiazide (HYZAAR) 50-12.5 MG tablet Take 1 tablet by mouth daily. 90 tablet 3  . rosuvastatin (CRESTOR) 20 MG tablet Take 20 mg by mouth daily.     No current facility-administered medications on file prior to visit.     No Known Allergies  Objective:  General: Alert and oriented x3 in no acute distress  Dermatology: Keratotic lesion present medial 2nd toe on left with skin lines transversing the lesion, pain is present with direct pressure to the lesion with a central nucleated core noted, no webspace macerations, no ecchymosis bilateral, all nails x 10 are well manicured.  Vascular: Dorsalis Pedis and Posterior Tibial pedal pulses 1/4, Capillary Fill Time 3 seconds, scant pedal hair growth bilateral, trace edema bilateral lower extremities, Temperature gradient within normal  limits.  Neurology: Michaell CowingGross sensation intact via light touch bilateral.  Musculoskeletal: Mild tenderness with palpation at the keratotic lesion site on Left, Muscular strength 4/5 in all groups without pain or limitation on range of motion. Bunion an hammetoe boney deformity noted.  Assessment and Plan: Problem List Items Addressed This Visit    None    Visit Diagnoses    Corns and callosities    -  Primary   Relevant Orders   DG Foot Complete Left   Hammertoe of second toe of left foot       Bunion       Toe pain, left          -Complete examination performed -Discussed treatment options -Parred keratoic lesion using a chisel blade -Avoid interdigital skin emollients -Encouraged use of pumice stone -Advised good supportive shoes and inserts and spacers -Patient to return to office as needed or sooner if condition worsens.  Asencion Islamitorya Melana Hingle, DPM

## 2017-12-03 NOTE — Progress Notes (Signed)
   Subjective:    Patient ID: Lynn Brown, female    DOB: 08/29/1931, 84 y.o.   MRN: [id number]  HPI    Review of Systems  All other systems reviewed and are negative.      Objective:   Physical Exam        Assessment & Plan:

## 2017-12-05 ENCOUNTER — Other Ambulatory Visit: Payer: Self-pay | Admitting: Sports Medicine

## 2017-12-05 DIAGNOSIS — L84 Corns and callosities: Secondary | ICD-10-CM

## 2017-12-05 DIAGNOSIS — M21619 Bunion of unspecified foot: Secondary | ICD-10-CM

## 2017-12-05 DIAGNOSIS — M79675 Pain in left toe(s): Secondary | ICD-10-CM

## 2017-12-05 DIAGNOSIS — M2042 Other hammer toe(s) (acquired), left foot: Secondary | ICD-10-CM

## 2017-12-11 ENCOUNTER — Other Ambulatory Visit: Payer: Medicare Other | Admitting: *Deleted

## 2017-12-11 ENCOUNTER — Ambulatory Visit (INDEPENDENT_AMBULATORY_CARE_PROVIDER_SITE_OTHER): Payer: Medicare Other | Admitting: Pharmacist

## 2017-12-11 VITALS — BP 140/78 | HR 63

## 2017-12-11 DIAGNOSIS — I5032 Chronic diastolic (congestive) heart failure: Secondary | ICD-10-CM

## 2017-12-11 DIAGNOSIS — I10 Essential (primary) hypertension: Secondary | ICD-10-CM | POA: Diagnosis not present

## 2017-12-11 LAB — BASIC METABOLIC PANEL
BUN/Creatinine Ratio: 16 (ref 12–28)
BUN: 15 mg/dL (ref 8–27)
CO2: 25 mmol/L (ref 20–29)
Calcium: 9.3 mg/dL (ref 8.7–10.3)
Chloride: 103 mmol/L (ref 96–106)
Creatinine, Ser: 0.95 mg/dL (ref 0.57–1.00)
GFR calc Af Amer: 64 mL/min/{1.73_m2} (ref >59)
GFR calc non Af Amer: 55 mL/min/{1.73_m2} — ABNORMAL LOW (ref >59)
Glucose: 96 mg/dL (ref 65–99)
Potassium: 4.4 mmol/L (ref 3.5–5.2)
Sodium: 143 mmol/L (ref 134–144)

## 2017-12-11 MED ORDER — LOSARTAN POTASSIUM 25 MG PO TABS: 25.0000 mg | ORAL_TABLET | Freq: Every day | ORAL | 3 refills | Status: DC

## 2017-12-11 MED ORDER — HYDROCHLOROTHIAZIDE 12.5 MG PO CAPS: 12.5000 mg | ORAL_CAPSULE | Freq: Every day | ORAL | 3 refills | Status: DC

## 2017-12-11 NOTE — Progress Notes (Signed)
Patient ID: Lynn BestWillie M Larkin                 DOB: 04/30/1932                      MRN: [id number]     HPI: Lynn Brown is a 84 y.o. female referred by Dr. Katrinka BlazingSmith to HTN clinic. PMH is significant for HTN, nephrectomy as a kidney donor, HLD, chronic lower extremity edema, HLD, hypothyroidism. She was seen by Dr Katrinka BlazingSmith 6 weeks ago for labile BP readings and LEE. BP was stable, her furosemide and potassium were discontinued, and she was advised to take Hyzaar 50-12.5mg  every day. She presents today for follow up.  Pt presents today in good spirits. She denies dizziness, blurred vision, falls, or headache. She stopped taking her furosemide and potassium as instructed. She does not take her Hyzaar every day due to low BP readings. She will either cut her dose in half or not take her dose if her systolic BP is < 782100. This happens once every few weeks. She has not taken her Hyzaar in 2 days and BP is decently well controlled today. Reports LEE has improved in her legs.  Current HTN meds: losartan-HCTZ 50-12.5mg  daily Previously tried: amlodipine - contributing to LEE? BP goal: 130/7380mmHg  Family History: CVA in her brother, father, and mother; Cancer in her brother; Diabetes in her sister; Heart attack in her sister; Heart disease in her sister; Hypertension in her brother, brother, father, mother, and sister; Kidney disease in her sister; Kidney failure in her sister; Multiple sclerosis in her sister; Other in her brother; Pancreatic cancer in her sister; Seizures in her brother.   Social History: Denies tobacco, alcohol, and illicit drug use.  Diet: Avoids fried food and sweets. Loves vegetables and salads.   Exercise: Tries to walk however has some knee pain  Home BP readings: 80-120/60-70s  Wt Readings from Last 3 Encounters:  12/03/17 179 lb (81.2 kg)  10/28/17 178 lb 6.4 oz (80.9 kg)  09/25/17 180 lb 9.6 oz (81.9 kg)   BP Readings from Last 3 Encounters:  12/03/17 117/73  10/28/17 132/84    09/25/17 118/70   Pulse Readings from Last 3 Encounters:  12/03/17 64  10/28/17 69  09/25/17 73    Renal function: CrCl cannot be calculated (Patient's most recent lab result is older than the maximum 21 days allowed.).  Past Medical History:  Diagnosis Date  . Allergic rhinitis   . Anxiety   . Bunion    PES PLANUS  . Cervical disc disease   . Hearing loss    HEARING AIDS, BUT DOES NOT WEAR THEM CONSISTENTLY.   Marland Kitchen. Hypercholesteremia   . Hypertension   . Hypothyroid   . Leaky heart valve    DR. HARWANI  . Mild obesity   . Osteoarthritis of knees, bilateral   . Osteopenia 04/27/2008   -1.8   . Osteoporosis 12/2015   T score -3.7 left hip  . Uterine polyp    H/O WITH BLEEDING---DR. FONTAINE  . Venous insufficiency     Current Outpatient Medications on File Prior to Visit  Medication Sig Dispense Refill  . ALPRAZolam (XANAX) 0.5 MG tablet Take 0.5 mg by mouth 3 (three) times daily as needed for anxiety.    . calcium citrate-vitamin D (CITRACAL+D) 315-200 MG-UNIT per tablet Take 1 tablet by mouth 2 (two) times daily.      . Cholecalciferol (VITAMIN D) 2000 UNITS tablet  Take 2,000 Units by mouth daily.      . clorazepate (TRANXENE) 3.75 MG tablet TAKE 1 2 TO 1 (ONE HALF TO ONE) TABLET BY MOUTH THREE TIMES DAILY AS NEEDED FOR ANXIETY  0  . furosemide (LASIX) 40 MG tablet Take 40 mg by mouth daily.  3  . levothyroxine (SYNTHROID, LEVOTHROID) 125 MCG tablet TAKE 1 TABLET BY MOUTH ONCE DAILY FOR 30 DAYS  3  . losartan-hydrochlorothiazide (HYZAAR) 50-12.5 MG tablet Take 1 tablet by mouth daily. 90 tablet 3  . potassium chloride (K-DUR) 10 MEQ tablet Take 10 mEq by mouth daily.  3  . rosuvastatin (CRESTOR) 20 MG tablet Take 20 mg by mouth daily.     No current facility-administered medications on file prior to visit.     No Known Allergies   Assessment/Plan:  1. Hypertension - BP fairly well controlled in clinic today with pt not having taken her Hyzaar in the past 2 days.  Since she has been noticing episodes of systolic BP falling < 100, will decrease Hyzaar dosing. Unfortunately the combination pill does not come in a 25-12.5mg  dose so will separate combo pill into losartan 25mg  daily and HCTZ 12.5mg  daily. Pt will continue to monitor BP at home and call clinic if her readings are still dropping low. F/u in pharmacy clinic in 4 weeks for BP check. Advised pt to bring in her home readings and BP cuff at that time.   Jaqua Ching E. Adelita Hone, PharmD, BCACP, CPP [facility] [address], [place], [place] [address] Phone: [phone]; Fax: [phone] 12/11/2017 10:36 AM

## 2017-12-11 NOTE — Patient Instructions (Addendum)
It was nice to meet you today  We will split up your losartan-HCTZ medicine (Hyzaar) into each of the medicines  We will decrease your losartan part to 25mg  daily and will continue HCTZ 12.5mg  daily  Continue to monitor your blood pressure at home. Call clinic if your top reading keeps dropping below 100  Follow up in clinic in 4 weeks for a blood pressure check. Please bring in your home readings and blood pressure cuff to this visit

## 2017-12-26 ENCOUNTER — Ambulatory Visit: Payer: Medicare Other | Admitting: Gynecology

## 2017-12-26 ENCOUNTER — Encounter: Payer: Self-pay | Admitting: Gynecology

## 2017-12-26 VITALS — BP 110/80 | Ht 60.0 in | Wt 185.0 lb

## 2017-12-26 DIAGNOSIS — N952 Postmenopausal atrophic vaginitis: Secondary | ICD-10-CM

## 2017-12-26 DIAGNOSIS — Z01419 Encounter for gynecological examination (general) (routine) without abnormal findings: Secondary | ICD-10-CM

## 2017-12-26 DIAGNOSIS — N8189 Other female genital prolapse: Secondary | ICD-10-CM

## 2017-12-26 DIAGNOSIS — M810 Age-related osteoporosis without current pathological fracture: Secondary | ICD-10-CM

## 2017-12-26 NOTE — Progress Notes (Signed)
    Lynn BoomWillie M Brown 11/04/1931 [id number]        84 y.o.  [id number] for annual gynecologic exam.  Without gynecologic complaints.  Past medical history,surgical history, problem list, medications, allergies, family history and social history were all reviewed and documented as reviewed in the EPIC chart.  ROS:  Performed with pertinent positives and negatives included in the history, assessment and plan.   Additional significant findings : None   Exam: Bari MantisKim Alexis assistant Vitals:   12/26/17 0908  BP: 110/80  Weight: 185 lb (83.9 kg)  Height: 5' (1.524 m)   Body mass index is 36.13 kg/m.  General appearance:  Normal affect, orientation and appearance. Skin: Grossly normal HEENT: Without gross lesions.  No cervical or supraclavicular adenopathy. Thyroid normal.  Lungs:  Clear without wheezing, rales or rhonchi Cardiac: RR, without RMG Abdominal:  Soft, nontender, without masses, guarding, rebound, organomegaly or hernia Breasts:  Examined lying and sitting without masses, retractions, discharge or axillary adenopathy. Pelvic:  Ext, BUS, Vagina: With atrophic changes.  First to second-degree or cystocele.  First to second-degree rectocele.  Cervix: Flush with upper vagina  Uterus: Difficult to palpate but no gross masses or tenderness,  Adnexa: Without masses or tenderness    Anus and perineum: Normal   Rectovaginal: Normal sphincter tone without palpated masses or tenderness.    Assessment/Plan:  84 y.o. [id number] female for annual gynecologic exam.   1. Postmenopausal.  No significant menopausal symptoms or any vaginal bleeding. 2. Pelvic relaxation with cystocele/rectocele.  Stable from last exam.  Asymptomatic to the patient.  Continue to monitor and report any symptoms. 3. Osteoporosis.  DEXA 2017 T score -3.7.  We discussed at her 01/04/2016 office visit to initiate alendronate but apparently never did.  Does have a history of Boniva number of years ago.  Recommend follow-up  DEXA now and then discussion about treatment options.  Patient will schedule her bone density and follow-up afterwards. 4. Mammography coming due in August and patient will follow-up for this.  Breast exam normal today. 5. Colonoscopy 2011.  Follow-up with primary physician for their recommendations for colon screening. 6. Pap smear 2009.  No Pap smear done today.  No history of significant abnormal Pap smears.  We both agree to stop screening based on current screening guidelines and age. 7. Health maintenance.  No routine lab work done as patient does this elsewhere.  Follow-up for bone density.  Follow-up for exam in 1 year.   Dara Lordsimothy P Kazuma Elena MD, 9:46 AM 12/26/2017

## 2017-12-26 NOTE — Patient Instructions (Signed)
Followup for bone density as scheduled. 

## 2017-12-31 ENCOUNTER — Other Ambulatory Visit: Payer: Self-pay | Admitting: Family Medicine

## 2017-12-31 DIAGNOSIS — Z1231 Encounter for screening mammogram for malignant neoplasm of breast: Secondary | ICD-10-CM

## 2018-01-07 ENCOUNTER — Encounter (INDEPENDENT_AMBULATORY_CARE_PROVIDER_SITE_OTHER): Payer: Self-pay

## 2018-01-07 ENCOUNTER — Ambulatory Visit (INDEPENDENT_AMBULATORY_CARE_PROVIDER_SITE_OTHER): Payer: Medicare Other

## 2018-01-07 VITALS — BP 144/66 | HR 56

## 2018-01-07 DIAGNOSIS — I10 Essential (primary) hypertension: Secondary | ICD-10-CM

## 2018-01-07 MED ORDER — HYDROCHLOROTHIAZIDE 25 MG PO TABS: 25.0000 mg | ORAL_TABLET | Freq: Every day | ORAL | 3 refills | Status: DC

## 2018-01-07 NOTE — Patient Instructions (Addendum)
It was nice to see you today.   Your blood pressure was not at goal of less than 130/3880mmHg today.   To help decrease your blood pressure and decrease the swelling in your legs increase hydrochlorothiazide (HCTZ) to 25mg  daily. You can take 2 of the HCTZ 12.5mg  dose and we will send in a new prescription to your pharmacy.   Continue taking  losartan 25mg  daily, and try to move this to take a night.   Continue taking your blood pressures at home and if the pressure is less than 100 or less than 60 give us at call at [phone] and we may adjust your medications.   Also look for any patterns for when you have high reading or low reading.   Also keep a log of when you get racing heart beats.   Follow up in a month for blood pressure check.

## 2018-01-07 NOTE — Progress Notes (Signed)
Patient ID: Lynn BestWillie M Spofford                 DOB: 05/04/1932                      MRN: [id number]     HPI: Lynn Brown is a 84 y.o. female referred by Dr. Katrinka BlazingSmith to HTN clinic. PMH is significant for HTN, nephrectomy as a kidney donor, HLD, chronic lower extremity edema, HLD, hypothyroidism. She was seen by Dr Katrinka BlazingSmith in June for labile BP readings and LEE. At the last clinic visit Hyzaar 50-12.5mg  was changed to Losartan 25mg  daily and HCTZ 12.5mg  daily due to occasional dizziness and hypotension at home. She presents today with her daughter for follow up.  Lynn Brown presents in good spirits. She denies dizziness, blurred vision, falls; however, she reports a tight feeling in her feet when her blood pressures get too high or too low. She also endorses a headache when her blood pressure is high (>160/4680mmHg) which has happened a few times since her last visit. She endorse some LEE. Her home BP readings are variable ranging from 90-169/56-5887mmHg with 50% of reading in goal. She reports no patterns as to why her blood pressure is low or high and reading are varied throughout the day. She endorses episodes of her heart racing 2-3 times per week. When her heart races she takes her blood pressure and reports her BP machine states her pulse is 140 and at one time unreadable because it was too high. She states the episodes occur at rest and there is no precipitating factor. She reports no missed doses and takes both medications in the morning.  She reports weight gain from 163lbs to 185lbs over past year. Daughter states she eats well but is moving less. She has increased her exercise recently since she received steroid shots in both knees.    Current HTN meds: losartan 25mg  daily and HCTZ 12.5mg  daily. Previously tried: amlodipine (possibly contributing to LEE) BP goal: 130/8680mmHg  Family History: CVA in her brother, father, and mother; Cancer in her brother; Diabetes in her sister; Heart attack in her sister;  Heart disease in her sister; Hypertension in her brother, brother, father, mother, and sister; Kidney disease in her sister; Kidney failure in her sister; Multiple sclerosis in her sister; Other in her brother; Pancreatic cancer in her sister; Seizures in her brother.  Social History: Denies tobacco, alcohol, and illicit drug use.  Diet: Avoids fried food and sweets. Loves vegetables and salads. Does not add salt to meals.   Exercise: Tries to walk however has some knee pain - walks more now since steroid shots in knee   Home BP readings: 90/56, 103/71, 102/62 169/73, 163/87 - outliers with majority 120-135/60-70. From her night readings 9/14 are at goal and from her day readings 14/24 are at goal  Wt Readings from Last 3 Encounters:  12/26/17 185 lb (83.9 kg)  12/03/17 179 lb (81.2 kg)  10/28/17 178 lb 6.4 oz (80.9 kg)   BP Readings from Last 3 Encounters:  12/26/17 110/80  12/11/17 140/78  12/03/17 117/73   Pulse Readings from Last 3 Encounters:  12/11/17 63  12/03/17 64  10/28/17 69    Renal function: CrCl cannot be calculated (Patient's most recent lab result is older than the maximum 21 days allowed.).  Past Medical History:  Diagnosis Date  . Allergic rhinitis   . Anxiety   . Bunion    PES PLANUS  .  Cervical disc disease   . Hearing loss    HEARING AIDS, BUT DOES NOT WEAR THEM CONSISTENTLY.   Marland Kitchen. Hypercholesteremia   . Hypertension   . Hypothyroid   . Leaky heart valve    DR. HARWANI  . Mild obesity   . Osteoarthritis of knees, bilateral   . Osteopenia 04/27/2008   -1.8   . Osteoporosis 12/2015   T score -3.7 left hip  . Uterine polyp    H/O WITH BLEEDING---DR. FONTAINE  . Venous insufficiency     Current Outpatient Medications on File Prior to Visit  Medication Sig Dispense Refill  . calcium citrate-vitamin D (CITRACAL+D) 315-200 MG-UNIT per tablet Take 1 tablet by mouth 2 (two) times daily.      . Cholecalciferol (VITAMIN D) 2000 UNITS tablet Take 2,000  Units by mouth daily.      . clorazepate (TRANXENE) 3.75 MG tablet TAKE 1 2 TO 1 (ONE HALF TO ONE) TABLET BY MOUTH THREE TIMES DAILY AS NEEDED FOR ANXIETY  0  . hydrochlorothiazide (MICROZIDE) 12.5 MG capsule Take 1 capsule (12.5 mg total) by mouth daily. 90 capsule 3  . levothyroxine (SYNTHROID, LEVOTHROID) 125 MCG tablet TAKE 1 TABLET BY MOUTH ONCE DAILY FOR 30 DAYS  3  . losartan (COZAAR) 25 MG tablet Take 1 tablet (25 mg total) by mouth daily. 90 tablet 3  . rosuvastatin (CRESTOR) 20 MG tablet Take 20 mg by mouth daily.     No current facility-administered medications on file prior to visit.     No Known Allergies   Assessment/Plan:  1. Hypertension - Blood pressure is not at of <130/3480mmHg today. Patient endorses headaches when blood pressure is high and continued LEE. Increase hydrochlorothiazide to 25mg  each morning. Continue losartan 25mg  daily but change administration time to the evening. Continue to keep a log of home blood pressures and log activity, diet changes, or any other precipitating factors when blood pressure is low (<100/5960mmHg) or high (>130/3880mmHg). Start documenting episodes of racing heart beats. Will send message to Dr. Katrinka BlazingSmith to let him know about the episodes of increased heart rate. Follow up in HTN clinic in 4 weeks.     Amanda PeaAnna Gioia Ranes, Ilda BassetPharm D PGY1 Pharmacy Resident  Phone [phone] 01/07/2018      10:24 AM

## 2018-01-23 ENCOUNTER — Telehealth: Payer: Self-pay | Admitting: Gynecology

## 2018-01-23 ENCOUNTER — Encounter: Payer: Self-pay | Admitting: Gynecology

## 2018-01-23 ENCOUNTER — Other Ambulatory Visit: Payer: Self-pay | Admitting: Gynecology

## 2018-01-23 ENCOUNTER — Ambulatory Visit (INDEPENDENT_AMBULATORY_CARE_PROVIDER_SITE_OTHER): Payer: Medicare Other

## 2018-01-23 DIAGNOSIS — M810 Age-related osteoporosis without current pathological fracture: Secondary | ICD-10-CM

## 2018-01-23 NOTE — Telephone Encounter (Signed)
Tell patient that her bone density continues to show osteoporosis.  My recommendation would be to consider starting a medication.  We could start Fosamax which is a weekly pill or consider a monthly pill like Actonel or Boniva.  Lastly we could consider Prolia which is a twice yearly shot.  I would recommend an office visit to discuss treatment options.

## 2018-01-23 NOTE — Telephone Encounter (Signed)
Called home # and not able to leave message, called cell and no voicemail set up

## 2018-01-29 NOTE — Telephone Encounter (Signed)
Patient informed, will call back to schedule OV.

## 2018-01-30 ENCOUNTER — Ambulatory Visit (INDEPENDENT_AMBULATORY_CARE_PROVIDER_SITE_OTHER): Payer: Medicare Other | Admitting: Pharmacist

## 2018-01-30 ENCOUNTER — Ambulatory Visit
Admission: RE | Admit: 2018-01-30 | Discharge: 2018-01-30 | Disposition: A | Discharge status: Home / Self Care | Payer: Medicare Other | Source: Ambulatory Visit | Attending: Family Medicine | Admitting: Family Medicine

## 2018-01-30 ENCOUNTER — Encounter: Payer: Self-pay | Admitting: Pharmacist

## 2018-01-30 VITALS — BP 110/68 | HR 77

## 2018-01-30 DIAGNOSIS — I10 Essential (primary) hypertension: Secondary | ICD-10-CM | POA: Diagnosis not present

## 2018-01-30 DIAGNOSIS — Z1231 Encounter for screening mammogram for malignant neoplasm of breast: Secondary | ICD-10-CM

## 2018-01-30 NOTE — Patient Instructions (Signed)
Return for a follow up appointment as recommended with Dr. Katrinka Blazing and with high blood pressure clinic if needed [phone])  Go to the lab today for blood work  Check your blood pressure at home daily (if able) and keep record of the readings.  Take your BP meds as follows: CONTINUE hydrochlorothiazide 25mg  daily, losartan 25mg  daily   Bring all of your meds, your BP cuff and your record of home blood pressures to your next appointment.  Exercise as you're able, try to walk approximately 30 minutes per day.  Keep salt intake to a minimum, especially watch canned and prepared boxed foods.  Eat more fresh fruits and vegetables and fewer canned items.  Avoid eating in fast food restaurants.    HOW TO TAKE YOUR BLOOD PRESSURE: . Rest 5 minutes before taking your blood pressure. .  Don't smoke or drink caffeinated beverages for at least 30 minutes before. . Take your blood pressure before (not after) you eat. . Sit comfortably with your back supported and both feet on the floor (don't cross your legs). . Elevate your arm to heart level on a table or a desk. . Use the proper sized cuff. It should fit smoothly and snugly around your bare upper arm. There should be enough room to slip a fingertip under the cuff. The bottom edge of the cuff should be 1 inch above the crease of the elbow. . Ideally, take 3 measurements at one sitting and record the average.

## 2018-01-30 NOTE — Progress Notes (Signed)
Patient ID: Lynn Brown                 DOB: 01-21-1932                      MRN: [id number]     HPI: Lynn Brown is a 84 y.o. female patient of Dr. Katrinka Blazing who presents today for hypertension follow up. PMH significant for HTN, nephrectomy as a kidney donor, HLD, chronic lower extremity edema, HLD, hypothyroidism. She was seen by Dr Katrinka Blazing in June for labile BP readings and LEE. Previously she reported about 20 lbs weight gain over the last year due to lack of exercise. At her last visit her HCTZ was increased to 25mg  daily and her losartan was rescheduled to the evening.   She presents today for follow up. She does occasionally take 2 of the losartan for high blood pressure. This happens about twice a week. She has not taken hctz today as it does make her go the restroom. She feels well on her current regimen.   Current HTN meds:  Hydrochlorothiazide 25mg  daily Losartan 25mg  daily   Previously tried: amlodipine (possibly contributing to LEE) BP goal: 130/45mmHg  Family History: CVA in her brother, father, and mother; Cancer in her brother; Diabetes in her sister; Heart attack in her sister; Heart disease in her sister; Hypertension in her brother, brother, father, mother, and sister; Kidney disease in her sister; Kidney failure in her sister; Multiple sclerosis in her sister; Other in her brother; Pancreatic cancer in her sister; Seizures in her brother.  Social History: Denies tobacco, alcohol, and illicit drug use.  Diet: Avoids fried food and sweets. Loves vegetables and salads. Does not add salt to meals.   Exercise: Tries to walk however has some knee pain - walks more now since steroid shots in knee   Home BP readings: 120s/60-90s  Wt Readings from Last 3 Encounters:  12/26/17 185 lb (83.9 kg)  12/03/17 179 lb (81.2 kg)  10/28/17 178 lb 6.4 oz (80.9 kg)   BP Readings from Last 3 Encounters:  01/30/18 110/68  01/07/18 (!) 144/66  12/26/17 110/80   Pulse Readings from  Last 3 Encounters:  01/30/18 77  01/07/18 (!) 56  12/11/17 63    Renal function: CrCl cannot be calculated (Patient's most recent lab result is older than the maximum 21 days allowed.).  Past Medical History:  Diagnosis Date  . Allergic rhinitis   . Anxiety   . Bunion    PES PLANUS  . Cervical disc disease   . Hearing loss    HEARING AIDS, BUT DOES NOT WEAR THEM CONSISTENTLY.   Marland Kitchen Hypercholesteremia   . Hypertension   . Hypothyroid   . Leaky heart valve    DR. HARWANI  . Mild obesity   . Osteoarthritis of knees, bilateral   . Osteoporosis 12/2017   T score -3.3  . Uterine polyp    H/O WITH BLEEDING---DR. FONTAINE  . Venous insufficiency     Current Outpatient Medications on File Prior to Visit  Medication Sig Dispense Refill  . calcium citrate-vitamin D (CITRACAL+D) 315-200 MG-UNIT per tablet Take 1 tablet by mouth daily.     . Cholecalciferol (VITAMIN D) 2000 UNITS tablet Take 2,000 Units by mouth daily.      . clorazepate (TRANXENE) 3.75 MG tablet TAKE 1 2 TO 1 (ONE HALF TO ONE) TABLET BY MOUTH THREE TIMES DAILY AS NEEDED FOR ANXIETY  0  . hydrochlorothiazide (  HYDRODIURIL) 25 MG tablet Take 1 tablet (25 mg total) by mouth daily. 90 tablet 3  . levothyroxine (SYNTHROID, LEVOTHROID) 125 MCG tablet TAKE 1 TABLET BY MOUTH ONCE DAILY FOR 30 DAYS  3  . losartan (COZAAR) 25 MG tablet Take 1 tablet (25 mg total) by mouth daily. 90 tablet 3  . magnesium oxide (MAG-OX) 400 MG tablet Take 400 mg by mouth daily.    . rosuvastatin (CRESTOR) 20 MG tablet Take 20 mg by mouth daily.     No current facility-administered medications on file prior to visit.     No Known Allergies  Blood pressure 110/68, pulse 77.   Assessment/Plan: Hypertension: BMET today for dose increase of HCTZ. BP today is at goal. Her cuff measures higher than my manual readings today (124/76). Will continue regimen as prescribed. Advised that if she continues to need losartan several times a week she should  call so that dose can be adjusted (potentially 37.5mg  daily). Follow up with Dr. Katrinka Blazing as scheduled and HTN clinic as needed.   Thank you, Freddie Apley. Cleatis Polka, PharmD  [facility]  01/30/2018 4:48 PM  ADDENDUM: BMET WNL. Continue as above.

## 2018-01-31 LAB — BASIC METABOLIC PANEL
BUN/Creatinine Ratio: 19 (ref 12–28)
BUN: 18 mg/dL (ref 8–27)
CO2: 27 mmol/L (ref 20–29)
Calcium: 9.7 mg/dL (ref 8.7–10.3)
Chloride: 102 mmol/L (ref 96–106)
Creatinine, Ser: 0.96 mg/dL (ref 0.57–1.00)
GFR calc Af Amer: 63 mL/min/{1.73_m2} (ref >59)
GFR calc non Af Amer: 54 mL/min/{1.73_m2} — ABNORMAL LOW (ref >59)
Glucose: 100 mg/dL — ABNORMAL HIGH (ref 65–99)
Potassium: 4.2 mmol/L (ref 3.5–5.2)
Sodium: 144 mmol/L (ref 134–144)

## 2018-02-17 ENCOUNTER — Emergency Department (HOSPITAL_COMMUNITY): Payer: Medicare Other

## 2018-02-17 ENCOUNTER — Encounter (HOSPITAL_COMMUNITY): Payer: Self-pay | Admitting: Emergency Medicine

## 2018-02-17 ENCOUNTER — Emergency Department (HOSPITAL_COMMUNITY)
Admission: EM | Admit: 2018-02-17 | Discharge: 2018-02-17 | Disposition: A | Discharge status: Home / Self Care | Payer: Medicare Other | Attending: Emergency Medicine | Admitting: Emergency Medicine

## 2018-02-17 DIAGNOSIS — Y939 Activity, unspecified: Secondary | ICD-10-CM | POA: Insufficient documentation

## 2018-02-17 DIAGNOSIS — S92412A Displaced fracture of proximal phalanx of left great toe, initial encounter for closed fracture: Secondary | ICD-10-CM | POA: Insufficient documentation

## 2018-02-17 DIAGNOSIS — E039 Hypothyroidism, unspecified: Secondary | ICD-10-CM | POA: Diagnosis not present

## 2018-02-17 DIAGNOSIS — Y999 Unspecified external cause status: Secondary | ICD-10-CM | POA: Insufficient documentation

## 2018-02-17 DIAGNOSIS — I5032 Chronic diastolic (congestive) heart failure: Secondary | ICD-10-CM | POA: Insufficient documentation

## 2018-02-17 DIAGNOSIS — S99922A Unspecified injury of left foot, initial encounter: Secondary | ICD-10-CM | POA: Diagnosis present

## 2018-02-17 DIAGNOSIS — Y92003 Bedroom of unspecified non-institutional (private) residence as the place of occurrence of the external cause: Secondary | ICD-10-CM | POA: Diagnosis not present

## 2018-02-17 DIAGNOSIS — W228XXA Striking against or struck by other objects, initial encounter: Secondary | ICD-10-CM | POA: Diagnosis not present

## 2018-02-17 DIAGNOSIS — I110 Hypertensive heart disease with heart failure: Secondary | ICD-10-CM | POA: Insufficient documentation

## 2018-02-17 NOTE — ED Provider Notes (Signed)
Emergency Department Provider Note   I have reviewed the triage vital signs and the nursing notes.   HISTORY  Chief Complaint Toe Injury   HPI Lynn Brown is a 84 y.o. female who kicked something on the bed couple weeks ago with her left great toe.  Patient states it was initially pretty painful and swollen and the pain seems to have improved quite a bit but still remains swollen and has some black around the bottom edges of her toenail and underneath the toenail is concerning.  She has no history of diabetes and has full sensation in her feet.  No injuries elsewhere.  No fever, nausea, vomiting or other systemic symptoms. No other associated or modifying symptoms.    Past Medical History:  Diagnosis Date  . Allergic rhinitis   . Anxiety   . Bunion    PES PLANUS  . Cervical disc disease   . Hearing loss    HEARING AIDS, BUT DOES NOT WEAR THEM CONSISTENTLY.   Marland Kitchen Hypercholesteremia   . Hypertension   . Hypothyroid   . Leaky heart valve    DR. HARWANI  . Mild obesity   . Osteoarthritis of knees, bilateral   . Osteoporosis 12/2017   T score -3.3  . Uterine polyp    H/O WITH BLEEDING---DR. FONTAINE  . Venous insufficiency     Patient Active Problem List   Diagnosis Date Noted  . Chronic diastolic heart failure (HCC) 09/25/2017  . Hypothyroid   . Essential hypertension   . Hypercholesteremia   . Osteopenia 04/27/2008    Past Surgical History:  Procedure Laterality Date  . CATARACT EXTRACTION, BILATERAL  2013   DR. SHAPIRO   . HYSTEROSCOPY     D & C  . NEPHRECTOMY LIVING DONOR  1978    Current Outpatient Rx  . Order #: [id number] Class: Historical Med  . Order #: [id number] Class: Historical Med  . Order #: [id number] Class: Historical Med  . Order #: [id number] Class: Normal  . Order #: [id number] Class: Historical Med  . Order #: [id number] Class: Normal  . Order #: [id number] Class: Historical Med  . Order #: [id number] Class: Historical Med    Allergies Patient has  no known allergies.  Family History  Problem Relation Age of Onset  . Hypertension Mother   . CVA Mother   . Diabetes Sister   . Hypertension Sister   . Heart disease Sister   . Multiple sclerosis Sister   . Hypertension Father   . CVA Father   . Cancer Brother        prostrate  . Hypertension Brother   . CVA Brother   . Seizures Brother   . Pancreatic cancer Sister   . Kidney disease Sister   . Kidney failure Sister   . Heart attack Sister   . Other Brother        PNEUMONIA AS A BABY  . Hypertension Brother   . Breast cancer Neg Hx     Social History Social History   Tobacco Use  . Smoking status: Never Smoker  . Smokeless tobacco: Never Used  Substance Use Topics  . Alcohol use: No    Alcohol/week: 0.0 standard drinks  . Drug use: No    Review of Systems  All other systems negative except as documented in the HPI. All pertinent positives and negatives as reviewed in the HPI. ____________________________________________   PHYSICAL EXAM:  VITAL SIGNS: ED Triage Vitals [02/17/18 1036]  Enc Vitals Group     BP Marland Kitchen)  146/80     Pulse Rate 76     Resp 18     Temp 97.6 F (36.4 C)     Temp Source Oral     SpO2 95 %     Weight 180 lb (81.6 kg)     Height 5\' 2"  (1.575 m)     Head Circumference      Peak Flow      Pain Score      Pain Loc      Pain Edu?      Excl. in GC?     Constitutional: Alert and oriented. Well appearing and in no acute distress. Eyes: Conjunctivae are normal. PERRL. EOMI. Head: Atraumatic. Nose: No congestion/rhinnorhea. Mouth/Throat: Mucous membranes are moist.  Oropharynx non-erythematous. Neck: No stridor.  No meningeal signs.   Cardiovascular: Normal rate, regular rhythm. Good peripheral circulation. Grossly normal heart sounds.   Respiratory: Normal respiratory effort.  No retractions. Lungs CTAB. Gastrointestinal: Soft and nontender. No distention.  Musculoskeletal: No lower extremity tenderness nor edema. No gross  deformities of extremities. Left great toe with swelling, mild ttp at MTP joint. Black discoloration at base of toenail and an area of discoloration under nail.  Neurologic:  Normal speech and language. No gross focal neurologic deficits are appreciated.  Skin:  Skin is warm, dry and intact. No rash noted. ____________________________________________  RADIOLOGY  Dg Foot Complete Left  Result Date: 02/17/2018 CLINICAL DATA:  Left great toe swelling. Injury while getting out of bed earlier this month. Initial encounter. EXAM: LEFT FOOT - COMPLETE 3+ VIEW COMPARISON:  None. FINDINGS: There is an oblique, minimally displaced fracture of the distal phalanx of the great toe which involves the articular surface. There is no dislocation. Overlying soft tissue swelling is noted. There is pes planus. Degenerative changes are noted in the midfoot. Scattered small calcifications are noted in the soft tissues of the lower leg. IMPRESSION: Minimally displaced intra-articular fracture of the distal phalanx of the great toe. Electronically Signed   By: Sebastian AcheAllen  Grady M.D.   On: 02/17/2018 12:58    ____________________________________________    INITIAL IMPRESSION / ASSESSMENT AND PLAN / ED COURSE  Eval to ensure no fracture.  Symptomatic control and podiatry otherwise.  Toe fx at location of pain. Buddy tape. Post op shoe. Ortho follow up. Pain isn't really an issue for her, will continue OTC meds.    Pertinent labs & imaging results that were available during my care of the patient were reviewed by me and considered in my medical decision making (see chart for details).  ____________________________________________  FINAL CLINICAL IMPRESSION(S) / ED DIAGNOSES  Final diagnoses:  Closed displaced fracture of proximal phalanx of left great toe, initial encounter     MEDICATIONS GIVEN DURING THIS VISIT:  Medications - No data to display   NEW OUTPATIENT MEDICATIONS STARTED DURING THIS  VISIT:  New Prescriptions   No medications on file    Note:  This note was prepared with assistance of Dragon voice recognition software. Occasional wrong-word or sound-a-like substitutions may have occurred due to the inherent limitations of voice recognition software.   Marily MemosMesner, Rondalyn Belford, MD 02/17/18 1335

## 2018-02-17 NOTE — ED Triage Notes (Addendum)
Pt reports 2 saturdays ago she was getting ou toe bed and got her toe hung up on something. Reports swelling to left big toe.

## 2018-03-09 ENCOUNTER — Other Ambulatory Visit: Payer: Self-pay

## 2018-03-09 ENCOUNTER — Encounter (HOSPITAL_COMMUNITY): Payer: Self-pay | Admitting: Emergency Medicine

## 2018-03-09 ENCOUNTER — Emergency Department (HOSPITAL_COMMUNITY): Payer: Medicare Other

## 2018-03-09 ENCOUNTER — Inpatient Hospital Stay (HOSPITAL_COMMUNITY)
Admission: EM | Admit: 2018-03-09 | Discharge: 2018-03-12 | DRG: 281 | Disposition: A | Discharge status: Home / Self Care | Payer: Medicare Other | Attending: Internal Medicine | Admitting: Internal Medicine

## 2018-03-09 DIAGNOSIS — I10 Essential (primary) hypertension: Secondary | ICD-10-CM | POA: Diagnosis present

## 2018-03-09 DIAGNOSIS — E785 Hyperlipidemia, unspecified: Secondary | ICD-10-CM | POA: Diagnosis present

## 2018-03-09 DIAGNOSIS — Z905 Acquired absence of kidney: Secondary | ICD-10-CM

## 2018-03-09 DIAGNOSIS — E7800 Pure hypercholesterolemia, unspecified: Secondary | ICD-10-CM | POA: Diagnosis present

## 2018-03-09 DIAGNOSIS — M810 Age-related osteoporosis without current pathological fracture: Secondary | ICD-10-CM | POA: Diagnosis present

## 2018-03-09 DIAGNOSIS — I110 Hypertensive heart disease with heart failure: Secondary | ICD-10-CM | POA: Diagnosis present

## 2018-03-09 DIAGNOSIS — I471 Supraventricular tachycardia: Secondary | ICD-10-CM | POA: Diagnosis present

## 2018-03-09 DIAGNOSIS — Z7982 Long term (current) use of aspirin: Secondary | ICD-10-CM

## 2018-03-09 DIAGNOSIS — I214 Non-ST elevation (NSTEMI) myocardial infarction: Principal | ICD-10-CM | POA: Diagnosis present

## 2018-03-09 DIAGNOSIS — I5032 Chronic diastolic (congestive) heart failure: Secondary | ICD-10-CM | POA: Diagnosis present

## 2018-03-09 DIAGNOSIS — Z79890 Hormone replacement therapy (postmenopausal): Secondary | ICD-10-CM

## 2018-03-09 DIAGNOSIS — E039 Hypothyroidism, unspecified: Secondary | ICD-10-CM | POA: Diagnosis present

## 2018-03-09 DIAGNOSIS — Z8249 Family history of ischemic heart disease and other diseases of the circulatory system: Secondary | ICD-10-CM

## 2018-03-09 DIAGNOSIS — Z79899 Other long term (current) drug therapy: Secondary | ICD-10-CM | POA: Diagnosis not present

## 2018-03-09 DIAGNOSIS — K5900 Constipation, unspecified: Secondary | ICD-10-CM | POA: Diagnosis present

## 2018-03-09 DIAGNOSIS — H9190 Unspecified hearing loss, unspecified ear: Secondary | ICD-10-CM | POA: Diagnosis present

## 2018-03-09 DIAGNOSIS — I959 Hypotension, unspecified: Secondary | ICD-10-CM | POA: Diagnosis present

## 2018-03-09 LAB — BASIC METABOLIC PANEL
Anion gap: 11 (ref 5–15)
BUN: 27 mg/dL — ABNORMAL HIGH (ref 8–23)
CO2: 28 mmol/L (ref 22–32)
Calcium: 9.5 mg/dL (ref 8.9–10.3)
Chloride: 102 mmol/L (ref 98–111)
Creatinine, Ser: 0.98 mg/dL (ref 0.44–1.00)
GFR calc Af Amer: 60 mL/min — ABNORMAL LOW (ref >60)
GFR calc non Af Amer: 52 mL/min — ABNORMAL LOW (ref >60)
Glucose, Bld: 117 mg/dL — ABNORMAL HIGH (ref 70–99)
Potassium: 3.9 mmol/L (ref 3.5–5.1)
Sodium: 141 mmol/L (ref 135–145)

## 2018-03-09 LAB — POCT I-STAT TROPONIN I
Troponin i, poc: 0.13 ng/mL (ref 0.00–0.08)
Troponin i, poc: 0.49 ng/mL (ref 0.00–0.08)

## 2018-03-09 LAB — CBC
HCT: 45.8 % (ref 36.0–46.0)
Hemoglobin: 14.6 g/dL (ref 12.0–15.0)
MCH: 30.5 pg (ref 26.0–34.0)
MCHC: 31.9 g/dL (ref 30.0–36.0)
MCV: 95.8 fL (ref 80.0–100.0)
Platelets: 152 10*3/uL (ref 150–400)
RBC: 4.78 MIL/uL (ref 3.87–5.11)
RDW: 13.5 % (ref 11.5–15.5)
WBC: 4.2 10*3/uL (ref 4.0–10.5)
nRBC: 0 % (ref 0.0–0.2)

## 2018-03-09 LAB — BRAIN NATRIURETIC PEPTIDE: B Natriuretic Peptide: 74.6 pg/mL (ref 0.0–100.0)

## 2018-03-09 LAB — MRSA PCR SCREENING: MRSA by PCR: NEGATIVE

## 2018-03-09 MED ORDER — LEVOTHYROXINE SODIUM 25 MCG PO TABS
125.0000 ug | ORAL_TABLET | Freq: Every day | ORAL | Status: DC
  Administered 2018-03-10 – 2018-03-12 (×3): 125 ug via ORAL
  Filled 2018-03-09 (×3): qty 1

## 2018-03-09 MED ORDER — SODIUM CHLORIDE 0.9 % IV SOLN
250.0000 mL | INTRAVENOUS | Status: DC | PRN
  Administered 2018-03-10: 250 mL via INTRAVENOUS

## 2018-03-09 MED ORDER — HEPARIN (PORCINE) IN NACL 100-0.45 UNIT/ML-% IJ SOLN
800.0000 [IU]/h | INTRAMUSCULAR | Status: DC
  Administered 2018-03-09: 800 [IU]/h via INTRAVENOUS
  Filled 2018-03-09: qty 250

## 2018-03-09 MED ORDER — HYDROCHLOROTHIAZIDE 25 MG PO TABS
25.0000 mg | ORAL_TABLET | Freq: Every day | ORAL | Status: DC
  Administered 2018-03-09: 25 mg via ORAL
  Filled 2018-03-09: qty 1

## 2018-03-09 MED ORDER — ASPIRIN 81 MG PO CHEW
81.0000 mg | CHEWABLE_TABLET | ORAL | Status: AC
  Administered 2018-03-10: 81 mg via ORAL
  Filled 2018-03-09: qty 1

## 2018-03-09 MED ORDER — SODIUM CHLORIDE 0.9 % WEIGHT BASED INFUSION
3.0000 mL/kg/h | INTRAVENOUS | Status: DC
  Administered 2018-03-10: 3 mL/kg/h via INTRAVENOUS

## 2018-03-09 MED ORDER — MORPHINE SULFATE (PF) 2 MG/ML IV SOLN
2.0000 mg | Freq: Once | INTRAVENOUS | Status: AC
  Administered 2018-03-09: 2 mg via INTRAVENOUS
  Filled 2018-03-09: qty 1

## 2018-03-09 MED ORDER — ASPIRIN EC 81 MG PO TBEC
162.0000 mg | DELAYED_RELEASE_TABLET | Freq: Once | ORAL | Status: AC
  Administered 2018-03-09: 162 mg via ORAL
  Filled 2018-03-09: qty 2

## 2018-03-09 MED ORDER — SODIUM CHLORIDE 0.9 % WEIGHT BASED INFUSION
1.0000 mL/kg/h | INTRAVENOUS | Status: DC
  Administered 2018-03-10: 1 mL/kg/h via INTRAVENOUS

## 2018-03-09 MED ORDER — SODIUM CHLORIDE 0.9% FLUSH: 3.0000 mL | INTRAVENOUS | Status: DC | PRN

## 2018-03-09 MED ORDER — MAGNESIUM OXIDE 400 (241.3 MG) MG PO TABS
400.0000 mg | ORAL_TABLET | Freq: Every day | ORAL | Status: DC
  Administered 2018-03-09 – 2018-03-12 (×4): 400 mg via ORAL
  Filled 2018-03-09 (×4): qty 1

## 2018-03-09 MED ORDER — NITROGLYCERIN 0.4 MG SL SUBL
0.4000 mg | SUBLINGUAL_TABLET | SUBLINGUAL | Status: DC | PRN
  Administered 2018-03-09 (×2): 0.4 mg via SUBLINGUAL

## 2018-03-09 MED ORDER — HEPARIN BOLUS VIA INFUSION
3500.0000 [IU] | Freq: Once | INTRAVENOUS | Status: AC
  Administered 2018-03-09: 3500 [IU] via INTRAVENOUS
  Filled 2018-03-09: qty 3500

## 2018-03-09 MED ORDER — SODIUM CHLORIDE 0.9% FLUSH
3.0000 mL | Freq: Two times a day (BID) | INTRAVENOUS | Status: DC
  Administered 2018-03-10: 3 mL via INTRAVENOUS

## 2018-03-09 MED ORDER — ROSUVASTATIN CALCIUM 20 MG PO TABS
20.0000 mg | ORAL_TABLET | Freq: Every day | ORAL | Status: DC
  Administered 2018-03-09 – 2018-03-12 (×3): 20 mg via ORAL
  Filled 2018-03-09 (×4): qty 1

## 2018-03-09 MED ORDER — ASPIRIN EC 81 MG PO TBEC
81.0000 mg | DELAYED_RELEASE_TABLET | Freq: Every day | ORAL | Status: DC
  Filled 2018-03-09: qty 1

## 2018-03-09 MED ORDER — NITROGLYCERIN 0.4 MG SL SUBL
SUBLINGUAL_TABLET | SUBLINGUAL | Status: AC
  Filled 2018-03-09: qty 3

## 2018-03-09 NOTE — ED Notes (Signed)
Ed provider Jeraldine Loots notified patient has a critical troponin value of 0.13

## 2018-03-09 NOTE — ED Notes (Signed)
Ed provider Jeraldine Loots notified patient has a critical troponin value of 0.49

## 2018-03-09 NOTE — ED Notes (Signed)
Patient transported to X-ray 

## 2018-03-09 NOTE — H&P (Signed)
History and Physical    Lynn Brown:[id number] DOB: 1931/08/31 DOA: 03/09/2018  PCP: Laurann Montana, MD  Patient coming from: home  I have personally briefly reviewed patient's old medical records in [facility]  Chief Complaint: cp  HPI: Lynn Brown is Lynn Brown 84 y.o. female with medical history significant of hypertension, hypothyroidism, anxiety, hyperlipidemia multiple other medical problems presenting with chest pain.  She notes her symptoms started sometime in the past week.  She describes her symptoms has left arm pain which has been on and off for the past week as well as [name] left breast pain, left subscap pain and substernal pressure.  She notes this is coming gone, but this morning it is worse.  She notes persistent pain at this time after the morphine with persistent pressure.  She states that she came to the emergency department because of the pain is also her blood pressure was up and down.  She denies any fevers, chills, cough, cold, sweating, numbness, nausea, vomiting, abdominal pain.  She does note some palpitations.  ED Course: Labs, EKG, morphine.  Discussed with cards.  Hospitalists to admit.  Review of Systems: As per HPI otherwise 10 point review of systems negative.   Past Medical History:  Diagnosis Date  . Allergic rhinitis   . Anxiety   . Bunion    PES PLANUS  . Cervical disc disease   . Hearing loss    HEARING AIDS, BUT DOES NOT WEAR THEM CONSISTENTLY.   Marland Kitchen Hypercholesteremia   . Hypertension   . Hypothyroid   . Leaky heart valve    DR. HARWANI  . Mild obesity   . Osteoarthritis of knees, bilateral   . Osteoporosis 12/2017   T score -3.3  . Uterine polyp    H/O WITH BLEEDING---DR. FONTAINE  . Venous insufficiency     Past Surgical History:  Procedure Laterality Date  . CATARACT EXTRACTION, BILATERAL  2013   DR. SHAPIRO   . HYSTEROSCOPY     D & C  . NEPHRECTOMY LIVING DONOR  1978     reports that she has never smoked. She has never  used smokeless tobacco. She reports that she does not drink alcohol or use drugs.  No Known Allergies  Family History  Problem Relation Age of Onset  . Hypertension Mother   . CVA Mother   . Diabetes Sister   . Hypertension Sister   . Heart disease Sister   . Multiple sclerosis Sister   . Hypertension Father   . CVA Father   . Cancer Brother        prostrate  . Hypertension Brother   . CVA Brother   . Seizures Brother   . Pancreatic cancer Sister   . Kidney disease Sister   . Kidney failure Sister   . Heart attack Sister   . Other Brother        PNEUMONIA AS Gail [place]more BABY  . Hypertension Brother   . Breast cancer Neg Hx    Prior to Admission medications   Medication Sig Start Date End Date Taking? Authorizing Provider  aspirin EC 81 MG tablet Take 162 mg by mouth as needed (chest pain).    Yes [provider]  calcium citrate-vitamin D (CITRACAL+D) 315-200 MG-UNIT per tablet Take 1 tablet by mouth daily.    Yes [provider]  Cholecalciferol (VITAMIN D) 2000 UNITS tablet Take 2,000 Units by mouth daily.     Yes [provider]  Coenzyme  Q10 (CO Q 10 PO) Take 1 tablet by mouth daily.   Yes [provider]  hydrochlorothiazide (HYDRODIURIL) 25 MG tablet Take 1 tablet (25 mg total) by mouth daily. 01/07/18 04/07/18 Yes Lyn Records, MD  levothyroxine (SYNTHROID, LEVOTHROID) 125 MCG tablet Take 125 mcg by mouth daily before breakfast.  11/19/17  Yes [provider]  losartan (COZAAR) 25 MG tablet Take 1 tablet (25 mg total) by mouth daily. 12/11/17 03/11/18 Yes Lyn Records, MD  magnesium oxide (MAG-OX) 400 MG tablet Take 400 mg by mouth daily.   Yes [provider]  Omega-3 Fatty Acids (FISH OIL PO) Take 1 tablet by mouth daily.   Yes [provider]  rosuvastatin (CRESTOR) 20 MG tablet Take 20 mg by mouth daily.   Yes [provider]    Physical Exam: Vitals:   03/09/18 1600 03/09/18 1601 03/09/18 1630  03/09/18 1730  BP: 110/85 110/85 105/83 134/87  Pulse:  86 84   Resp: (!) 21 18 14 19   Temp:      TempSrc:      SpO2: 90% 90% 98% 98%  Weight:        Constitutional: NAD, calm, comfortable Vitals:   03/09/18 1600 03/09/18 1601 03/09/18 1630 03/09/18 1730  BP: 110/85 110/85 105/83 134/87  Pulse:  86 84   Resp: (!) 21 18 14 19   Temp:      TempSrc:      SpO2: 90% 90% 98% 98%  Weight:       Eyes: PERRL, lids and conjunctivae normal ENMT: Mucous membranes are moist. Posterior pharynx clear of any exudate or lesions.Normal dentition.  Neck: normal, supple, no masses, no thyromegaly Respiratory: clear to auscultation bilaterally, no wheezing, no crackles. Normal respiratory effort. No accessory muscle use.  Cardiovascular: Regular rate and rhythm, no murmurs / rubs / gallops. No extremity edema. 2+ pedal pulses. No carotid bruits.  Abdomen: no tenderness, no masses palpated. No hepatosplenomegaly. Bowel sounds positive.  Musculoskeletal: no clubbing / cyanosis. No joint deformity upper and lower extremities. Good ROM, no contractures. Normal muscle tone.  Skin: no rashes, lesions, ulcers. No induration Neurologic: CN 2-12 grossly intact. Sensation intact. Strength 5/5 in all 4.  Psychiatric: Normal judgment and insight. Alert and oriented x 3. Normal mood.   Labs on Admission: I have personally reviewed following labs and imaging studies  CBC: Recent Labs  Lab 03/09/18 1136  WBC 4.2  HGB 14.6  HCT 45.8  MCV 95.8  PLT 152   Basic Metabolic Panel: Recent Labs  Lab 03/09/18 1136  NA 141  K 3.9  CL 102  CO2 28  GLUCOSE 117*  BUN 27*  CREATININE 0.98  CALCIUM 9.5   GFR: Estimated Creatinine Clearance: 42.3 mL/min (by C-G formula based on SCr of 0.98 mg/dL). Liver Function Tests: No results for input(s): AST, ALT, ALKPHOS, BILITOT, PROT, ALBUMIN in the last 168 hours. No results for input(s): LIPASE, AMYLASE in the last 168 hours. No results for input(s): AMMONIA in  the last 168 hours. Coagulation Profile: No results for input(s): INR, PROTIME in the last 168 hours. Cardiac Enzymes: No results for input(s): CKTOTAL, CKMB, CKMBINDEX, TROPONINI in the last 168 hours. BNP (last 3 results) Recent Labs    09/25/17 0943  PROBNP 43   HbA1C: No results for input(s): HGBA1C in the last 72 hours. CBG: No results for input(s): GLUCAP in the last 168 hours. Lipid Profile: No results for input(s): CHOL, HDL, LDLCALC, TRIG, CHOLHDL, LDLDIRECT in  the last 72 hours. Thyroid Function Tests: No results for input(s): TSH, T4TOTAL, FREET4, T3FREE, THYROIDAB in the last 72 hours. Anemia Panel: No results for input(s): VITAMINB12, FOLATE, FERRITIN, TIBC, IRON, RETICCTPCT in the last 72 hours. Urine analysis:    Component Value Date/Time   COLORURINE STRAW (Kaytelyn Glore) 06/01/2017 2029   APPEARANCEUR CLEAR 06/01/2017 2029   LABSPEC 1.010 06/01/2017 2029   PHURINE 6.0 06/01/2017 2029   GLUCOSEU NEGATIVE 06/01/2017 2029   HGBUR NEGATIVE 06/01/2017 2029   BILIRUBINUR NEGATIVE 06/01/2017 2029   KETONESUR NEGATIVE 06/01/2017 2029   PROTEINUR 30 (Amelia Burgard) 06/01/2017 2029   UROBILINOGEN 0.2 01/04/2012 1450   NITRITE NEGATIVE 06/01/2017 2029   LEUKOCYTESUR MODERATE (Monico Sudduth) 06/01/2017 2029    Radiological Exams on Admission: Dg Chest 2 View  Result Date: 03/09/2018 CLINICAL DATA:  84 year old with left arm pain.  Chest pain. EXAM: CHEST - 2 VIEW COMPARISON:  06/01/2017 FINDINGS: Heart and mediastinum are stable and within normal limits. Atherosclerotic calcifications at the aortic arch. Trachea is midline. Lungs are clear without focal airspace disease or pulmonary edema. No large pleural effusions. Few densities in the posterior lower chest on the lateral view appear to represent overlying shadows. No acute bone abnormality. IMPRESSION: No active cardiopulmonary disease. Electronically Signed   By: Richarda Overlie M.D.   On: 03/09/2018 11:54    EKG: Independently reviewed. Normal sinus  rhythm.  Appears similar to priors with old Q in III, avr and V1 with T wave inversions in aVR and V1.  Assessment/Plan Active Problems:   NSTEMI (non-ST elevated myocardial infarction) (HCC)  NSTEMI: pt with on and off chest discomfort over the past week, worse this AM.  Somewhat improved with morphine, better with nitro.  EKG appears consistent with priors.  Troponin uptrending.  S/p 162 ASA at home, given additional 162 here.  Started on heparin gtt.  Cardiology consult.  S/p ASA Heparin gtt Lipids/A1c Cards c/s, planning for cath tomorrow  Hypertension: continue HCTZ.  Hold losartan for now.    Hypothyroidism: continue synthroid  HLD: continue crestor  DVT prophylaxis: heparin gtt  Code Status: full Family Communication: many family members at bedside including sister  Disposition Plan: pending improvement  Consults called: cardiology  Admission status: inpatient given NSTEMI which places pt at risk of decompensation.  She'll require greater than 2 midnights of evaluation/treatment in my medical opinion.   Lacretia Nicks MD [facility] Pager [phone]  If 7PM-7AM, please contact night-coverage [email or web address] Password [email or web address]  03/09/2018, 6:08 PM

## 2018-03-09 NOTE — ED Notes (Signed)
Pt ambulated to restroom w/o assist.    

## 2018-03-09 NOTE — ED Notes (Signed)
ED TO INPATIENT HANDOFF REPORT  Name/Age/Gender Lynn Brown 84 y.o. female  Code Status   Home/SNF/Other Home  Chief Complaint chest pain / bp 166/103  Level of Care/Admitting Diagnosis ED Disposition    ED Disposition Condition [place]: [facility] [100102]  Level of Care: Telemetry [5]  Admit to tele based on following criteria: Monitor for Ischemic changes  Diagnosis: NSTEMI (non-ST elevated myocardial infarction) [facility]) [992426]  Admitting Physician: Elodia Florence [phone]  Attending Physician: Cephus Slater, A CALDWELL [phone]  Estimated length of stay: past midnight tomorrow  Certification:: I certify this patient will need inpatient services for at least 2 midnights  PT Class (Do Not Modify): Inpatient [101]  PT Acc Code (Do Not Modify): Private [1]       Medical History Past Medical History:  Diagnosis Date  . Allergic rhinitis   . Anxiety   . Bunion    PES PLANUS  . Cervical disc disease   . Hearing loss    HEARING AIDS, BUT DOES NOT WEAR THEM CONSISTENTLY.   Marland Kitchen Hypercholesteremia   . Hypertension   . Hypothyroid   . Leaky heart valve    DR. HARWANI  . Mild obesity   . Osteoarthritis of knees, bilateral   . Osteoporosis 12/2017   T score -3.3  . Uterine polyp    H/O WITH BLEEDING---DR. FONTAINE  . Venous insufficiency     Allergies No Known Allergies  IV Location/Drains/Wounds Patient Lines/Drains/Airways Status   Active Line/Drains/Airways    Name:   Placement date:   Placement time:   Site:   Days:   Peripheral IV 03/09/18 Left Antecubital   03/09/18    1136    Antecubital   less than 1          Labs/Imaging Results for orders placed or performed during the hospital encounter of 03/09/18 (from the past 48 hour(s))  Basic metabolic panel     Status: Abnormal   Collection Time: 03/09/18 11:36 AM  Result Value Ref Range   Sodium 141 135 - 145 mmol/L   Potassium 3.9 3.5 - 5.1 mmol/L   Chloride 102 98 - 111 mmol/L   CO2 28 22 - 32 mmol/L   Glucose, Bld 117 (H) 70 - 99 mg/dL   BUN 27 (H) 8 - 23 mg/dL   Creatinine, Ser 0.98 0.44 - 1.00 mg/dL   Calcium 9.5 8.9 - 10.3 mg/dL   GFR calc non Af Amer 52 (L) >60 mL/min   GFR calc Af Amer 60 (L) >60 mL/min    Comment: (NOTE) The eGFR has been calculated using the CKD EPI equation. This calculation has not been validated in all clinical situations. eGFR's persistently <60 mL/min signify possible Chronic Kidney Disease.    Anion gap 11 5 - 15    Comment: Performed at [facility], [place] [address], [place], [place] [address]  CBC     Status: None   Collection Time: 03/09/18 11:36 AM  Result Value Ref Range   WBC 4.2 4.0 - 10.5 K/uL   RBC 4.78 3.87 - 5.11 MIL/uL   Hemoglobin 14.6 12.0 - 15.0 g/dL   HCT 45.8 36.0 - 46.0 %   MCV 95.8 80.0 - 100.0 fL   MCH 30.5 26.0 - 34.0 pg   MCHC 31.9 30.0 - 36.0 g/dL   RDW 13.5 11.5 - 15.5 %   Platelets 152 150 - 400 K/uL   nRBC 0.0  0.0 - 0.2 %    Comment: Performed at [facility], [place] [address], [place], [place] [address]  Brain natriuretic peptide     Status: None   Collection Time: 03/09/18 11:38 AM  Result Value Ref Range   B Natriuretic Peptide 74.6 0.0 - 100.0 pg/mL    Comment: Performed at [facility], [place] [address] [place], [place] [address]  POCT i-Stat troponin I     Status: Abnormal   Collection Time: 03/09/18 11:44 AM  Result Value Ref Range   Troponin i, poc 0.13 (HH) 0.00 - 0.08 ng/mL   Comment NOTIFIED PHYSICIAN    Comment 3            Comment: Due to the release kinetics of cTnI, a negative result within the first hours of the onset of symptoms does not rule out myocardial infarction with certainty. If myocardial infarction is still suspected, repeat the test at appropriate intervals.   POCT i-Stat troponin I     Status: Abnormal   Collection Time: 03/09/18  2:29 PM  Result Value Ref Range   Troponin  i, poc 0.49 (HH) 0.00 - 0.08 ng/mL   Comment NOTIFIED PHYSICIAN    Comment 3            Comment: Due to the release kinetics of cTnI, a negative result within the first hours of the onset of symptoms does not rule out myocardial infarction with certainty. If myocardial infarction is still suspected, repeat the test at appropriate intervals.    Dg Chest 2 View  Result Date: 03/09/2018 CLINICAL DATA:  84 year old with left arm pain.  Chest pain. EXAM: CHEST - 2 VIEW COMPARISON:  06/01/2017 FINDINGS: Heart and mediastinum are stable and within normal limits. Atherosclerotic calcifications at the aortic arch. Trachea is midline. Lungs are clear without focal airspace disease or pulmonary edema. No large pleural effusions. Few densities in the posterior lower chest on the lateral view appear to represent overlying shadows. No acute bone abnormality. IMPRESSION: No active cardiopulmonary disease. Electronically Signed   By: Markus Daft M.D.   On: 03/09/2018 11:54   None  Pending Labs Unresulted Labs (From admission, onward)    Start     Ordered   03/10/18 0500  CBC  Daily,   R     03/09/18 1612   03/10/18 0100  Heparin level (unfractionated)  Once-Timed,   R     03/09/18 1611   03/09/18 1604  APTT  STAT,   R     03/09/18 1603   03/09/18 1604  Protime-INR  STAT,   R     03/09/18 1603   Signed and Held  CBC  Tomorrow morning,   R     Signed and Held   Signed and Held  Comprehensive metabolic panel  Tomorrow morning,   R     Signed and Held   Signed and Held  Troponin I  Now then every 6 hours,   R     Signed and Held          Vitals/Pain Today's Vitals   03/09/18 1545 03/09/18 1549 03/09/18 1601 03/09/18 1630  BP: (!) 119/92 119/87 110/85 105/83  Pulse:  84 86 84  Resp:  _0 Temp:      TempSrc:      SpO2:  94% 90% 98%  Weight:      PainSc:   0-No pain     Isolation Precautions No active  isolations  Medications Medications  nitroGLYCERIN (NITROSTAT) SL tablet 0.4  mg (0.4 mg Sublingual Given 03/09/18 1551)  nitroGLYCERIN (NITROSTAT) 0.4 MG SL tablet (  Not Given 03/09/18 1600)  heparin bolus via infusion 3,500 Units (3,500 Units Intravenous Bolus from Bag 03/09/18 1643)    Followed by  heparin ADULT infusion 100 units/mL (25000 units/257m sodium chloride 0.45%) (800 Units/hr Intravenous New Bag/Given 03/09/18 1643)  morphine 2 MG/ML injection 2 mg (2 mg Intravenous Given 03/09/18 1513)  aspirin EC tablet 162 mg (162 mg Oral Given 03/09/18 1559)    Mobility walks

## 2018-03-09 NOTE — Consult Note (Signed)
CARDIOLOGY CONSULT NOTE  Patient ID: Lynn Brown, MRN: [id number], DOB/AGE: [birth year] 84 y.o. Admit date: 03/09/2018 Date of Consult: 03/09/2018  Primary Physician: Laurann Montana, MD Primary Cardiologist: [facility] Lynn Brown is a 84 y.o. female who is being seen today for the evaluation of + Tn at the request of Dr Lowell Guitar.    Chief Complaint: chest pain   HPI Lynn Brown is a 83 y.o. female presenting with discomfort in her left chest radiating to arm and shoulder.  She has had it recurrently over the last number of months more frequently over the last few weeks.  Most of the spells have been self-limited lasting just minutes.  This morning, the symptoms did not abate that she presented to the emergency room.  Symptoms have been associated with mild diaphoresis but moderate dyspnea on exertion.  She also has dyspnea on exertion independent of her chest discomfort which is been ascribed to HFpEF in the context of long-standing hypertensive heart disease  Troponins are positive.  Currently pain-free.  Cardiac risk factors include hypertension.  No diabetes.  Lipid status is unknown.  No cigarettes.  Se[facility] previously by [facility]; most recently seen by her labile hypertension.  Dr. Katrinka Blazing.  Echocardiogram demonstrated normal systolic function with some diastolic dysfunction.  Denies orthopnea, nocturnal dyspnea.  Has chronic peripheral edema.  No palpitations.  She had an episode about 3 weeks ago unassociated with chest discomfort but profound weakness;  abated spontaneously.   Past Medical History:  Diagnosis Date  . Allergic rhinitis   . Anxiety   . Bunion    PES PLANUS  . Cervical disc disease   . Hearing loss    HEARING AIDS, BUT DOES NOT WEAR THEM CONSISTENTLY.   Marland Kitchen Hypercholesteremia   . Hypertension   . Hypothyroid   . Leaky heart valve    DR. HARWANI  . Mild obesity   . Osteoarthritis of knees, bilateral   . Osteoporosis 12/2017   T score -3.3  . Uterine  polyp    H/O WITH BLEEDING---DR. FONTAINE  . Venous insufficiency       Surgical History:  Past Surgical History:  Procedure Laterality Date  . CATARACT EXTRACTION, BILATERAL  2013   DR. SHAPIRO   . HYSTEROSCOPY     D & C  . NEPHRECTOMY LIVING DONOR  1978     Home Meds: Prior to Admission medications   Medication Sig Start Date End Date Taking? Authorizing Provider  aspirin EC 81 MG tablet Take 162 mg by mouth as needed (chest pain).    Yes [provider]  calcium citrate-vitamin D (CITRACAL+D) 315-200 MG-UNIT per tablet Take 1 tablet by mouth daily.    Yes [provider]  Cholecalciferol (VITAMIN D) 2000 UNITS tablet Take 2,000 Units by mouth daily.     Yes [provider]  Coenzyme Q10 (CO Q 10 PO) Take 1 tablet by mouth daily.   Yes [provider]  hydrochlorothiazide (HYDRODIURIL) 25 MG tablet Take 1 tablet (25 mg total) by mouth daily. 01/07/18 04/07/18 Yes Lyn Records, MD  levothyroxine (SYNTHROID, LEVOTHROID) 125 MCG tablet Take 125 mcg by mouth daily before breakfast.  11/19/17  Yes [provider]  losartan (COZAAR) 25 MG tablet Take 1 tablet (25 mg total) by mouth daily. 12/11/17 03/11/18 Yes Lyn Records, MD  magnesium oxide (MAG-OX) 400 MG tablet Take 400 mg by mouth daily.   Yes [provider]  Omega-3 Fatty  Acids (FISH OIL PO) Take 1 tablet by mouth daily.   Yes [provider]  rosuvastatin (CRESTOR) 20 MG tablet Take 20 mg by mouth daily.   Yes [provider]    Inpatient Medications:  . heparin  3,500 Units Intravenous Once  . nitroGLYCERIN        Allergies: No Known Allergies  Social History   Socioeconomic History  . Marital status: Widowed    Spouse name: Not on file  . Number of children: 4  . Years of education: Not on file  . Highest education level: Not on file  Occupational History  . Occupation: RETIRED  Social Needs  . Financial resource strain: Not on file  .  Food insecurity:    Worry: Not on file    Inability: Not on file  . Transportation needs:    Medical: Not on file    Non-medical: Not on file  Tobacco Use  . Smoking status: Never Smoker  . Smokeless tobacco: Never Used  Substance and Sexual Activity  . Alcohol use: No    Alcohol/week: 0.0 standard drinks  . Drug use: No  . Sexual activity: Never    Comment: 1st intercourse 84 yo-Fewer than 5 partners  Lifestyle  . Physical activity:    Days per week: Not on file    Minutes per session: Not on file  . Stress: Not on file  Relationships  . Social connections:    Talks on phone: Not on file    Gets together: Not on file    Attends religious service: Not on file    Active member of club or organization: Not on file    Attends meetings of clubs or organizations: Not on file    Relationship status: Not on file  . Intimate partner violence:    Fear of current or ex partner: Not on file    Emotionally abused: Not on file    Physically abused: Not on file    Forced sexual activity: Not on file  Other Topics Concern  . Not on file  Social History Narrative  . Not on file     Family History  Problem Relation Age of Onset  . Hypertension Mother   . CVA Mother   . Diabetes Sister   . Hypertension Sister   . Heart disease Sister   . Multiple sclerosis Sister   . Hypertension Father   . CVA Father   . Cancer Brother        prostrate  . Hypertension Brother   . CVA Brother   . Seizures Brother   . Pancreatic cancer Sister   . Kidney disease Sister   . Kidney failure Sister   . Heart attack Sister   . Other Brother        PNEUMONIA AS A BABY  . Hypertension Brother   . Breast cancer Neg Hx      ROS:  Please see the history of present illness.     All other systems reviewed and negative.    Physical Exam: Blood pressure 110/85, pulse 86, temperature 98.2 F (36.8 C), temperature source Oral, resp. rate 18, weight 81.6 kg, SpO2 90 %. General: Well developed, well  nourished female in no acute distress. Head: Normocephalic, atraumatic, sclera non-icteric, no xanthomas, nares are without discharge. EENT: normal  Lymph Nodes:  none Neck: Negative for carotid bruits. JVD not elevated. Back:without scoliosis kyphosis Lungs: Clear bilaterally to auscultation without wheezes, rales, or rhonchi. Breathing is unlabored.  Heart: RRR with S1 S2 +S4.  1/6 systolic murmur . No rubs, or gallops appreciated. Abdomen: Soft, non-tender, non-distended with normoactive bowel sounds. No hepatomegaly. No rebound/guarding. No obvious abdominal masses. Msk:  Strength and tone appear normal for age. Extremities: No clubbing or cyanosis.  tr edema.  Distal pedal pulses are 2+ and equal bilaterally. Skin: Warm and Dry Neuro: Alert and oriented X 3. CN III-XII intact Grossly normal sensory and motor function . Psych:  Responds to questions appropriately with a normal affect.      Labs: Chemistry Recent Labs  Lab 03/09/18 1136  NA 141  K 3.9  CL 102  CO2 28  GLUCOSE 117*  BUN 27*  CREATININE 0.98  CALCIUM 9.5  GFRNONAA 52*  GFRAA 60*  ANIONGAP 11     Hematology Recent Labs  Lab 03/09/18 1136  WBC 4.2  RBC 4.78  HGB 14.6  HCT 45.8  MCV 95.8  MCH 30.5  MCHC 31.9  RDW 13.5  PLT 152    Cardiac EnzymesNo results for input(s): TROPONINI in the last 168 hours.  Recent Labs  Lab 03/09/18 1144 03/09/18 1429  TROPIPOC 0.13* 0.49*     BNP Recent Labs  Lab 03/09/18 1138  BNP 74.6     DDimer No results for input(s): DDIMER in the last 168 hours.   No results found for: CHOL, HDL, LDLCALC, TRIG  Thyroid Function Tests: No results for input(s): TSH, T4TOTAL, T3FREE, THYROIDAB in the last 72 hours.  Invalid input(s): FREET3 Miscellaneous No results found for: DDIMER  Radiology/Studies:  Dg Chest 2 View  Result Date: 03/09/2018 CLINICAL DATA:  84 year old with left arm pain.  Chest pain. EXAM: CHEST - 2 VIEW COMPARISON:  06/01/2017 FINDINGS:  Heart and mediastinum are stable and within normal limits. Atherosclerotic calcifications at the aortic arch. Trachea is midline. Lungs are clear without focal airspace disease or pulmonary edema. No large pleural effusions. Few densities in the posterior lower chest on the lateral view appear to represent overlying shadows. No acute bone abnormality. IMPRESSION: No active cardiopulmonary disease. Electronically Signed   By: Richarda Overlie M.D.   On: 03/09/2018 11:54   Dg Foot Complete Left  Result Date: 02/17/2018 CLINICAL DATA:  Left great toe swelling. Injury while getting out of bed earlier this month. Initial encounter. EXAM: LEFT FOOT - COMPLETE 3+ VIEW COMPARISON:  None. FINDINGS: There is an oblique, minimally displaced fracture of the distal phalanx of the great toe which involves the articular surface. There is no dislocation. Overlying soft tissue swelling is noted. There is pes planus. Degenerative changes are noted in the midfoot. Scattered small calcifications are noted in the soft tissues of the lower leg. IMPRESSION: Minimally displaced intra-articular fracture of the distal phalanx of the great toe. Electronically Signed   By: Sebastian Ache M.D.   On: 02/17/2018 12:58    EKG: NSR without acute ST depression  occ PVC   Assessment and Plan:  Non-STEMI  Hypertension-labile  Diastolic dysfunction  The patient presents with chest pain positive troponin consistent with non-STEMI. She has been heparinized and is on aspirin  and is currently pain-free. We will begin oral beta-blockers but will hold off for right now as her blood pressure is only 105 and continue statin therapy  We have discussed strategies including catheterization.  Risks and benefits.  She is agreeable.  We will plan to transfer to [facility].   Sherryl Manges

## 2018-03-09 NOTE — Progress Notes (Signed)
ANTICOAGULATION CONSULT NOTE - Initial Consult  Pharmacy Consult for Heparin Indication: chest pain/ACS  No Known Allergies  Patient Measurements: Weight: 180 lb (81.6 kg) Heparin Dosing Weight: 68 kg  Vital Signs: Temp: 98.2 F (36.8 C) (10/13 1103) Temp Source: Oral (10/13 1103) BP: 110/85 (10/13 1601) Pulse Rate: 86 (10/13 1601)  Labs: Recent Labs    03/09/18 1136  HGB 14.6  HCT 45.8  PLT 152  CREATININE 0.98    Estimated Creatinine Clearance: 42.3 mL/min (by C-G formula based on SCr of 0.98 mg/dL).   Medical History: Past Medical History:  Diagnosis Date  . Allergic rhinitis   . Anxiety   . Bunion    PES PLANUS  . Cervical disc disease   . Hearing loss    HEARING AIDS, BUT DOES NOT WEAR THEM CONSISTENTLY.   Marland Kitchen Hypercholesteremia   . Hypertension   . Hypothyroid   . Leaky heart valve    DR. HARWANI  . Mild obesity   . Osteoarthritis of knees, bilateral   . Osteoporosis 12/2017   T score -3.3  . Uterine polyp    H/O WITH BLEEDING---DR. FONTAINE  . Venous insufficiency     Medications:  No oral anticoagulation PTA  Assessment:  84 yr female presents with radiating chest pain.  Elevated troponins.  Pharmacy consulted to dose IV heparin for ACS/STEMI  Goal of Therapy:  Heparin level 0.3-0.7 units/ml Monitor platelets by anticoagulation protocol: Yes   Plan:   Obtain baseline aPTT and PT/INR  Heparin 3500 unit IV bolus x 1 followed by heparin infusion @ 800 units/hr  Check heparin level 8 hr after heparin started  Follow heparin level & CBC daily while on heparin  Mimi Debellis, Joselyn Glassman, PharmD 03/09/2018,4:08 PM

## 2018-03-09 NOTE — ED Triage Notes (Signed)
Pt c/o chest pain since this morning that radiates to the back and L arm / shoulder. Pt states her bp has been fluctuating over the past several days.

## 2018-03-09 NOTE — ED Provider Notes (Addendum)
[facility]-EMERGENCY DEPT Provider Note   CSN: [id number] Arrival date & time: 03/09/18  1050     History   Chief Complaint Chief Complaint  Patient presents with  . Hypertension  . Chest Pain    HPI Lynn Brown is a 84 y.o. female.  HPI Patient presents with her daughter who assists with the HPI. Patient is a poor historian. Initially the patient states that she has new left upper chest pain. But after a few moments, is clear the patient has had left upper chest pain, left arm pain for at least the past 2 days, similarly has had increasing dyspnea, fatigue for at least as long, possibly longer. No obvious new fever, no vomiting, no confusion No recent medication change, diet change per Patient has multiple medical issues including hypertension, for which she takes medication. Today after she noticed more severe soreness in her left upper chest and she has recently experienced, she checked her blood pressure found to be elevated, subsequently she took her blood pressure medication, but with persistent elevation she presents for evaluation Past Medical History:  Diagnosis Date  . Allergic rhinitis   . Anxiety   . Bunion    PES PLANUS  . Cervical disc disease   . Hearing loss    HEARING AIDS, BUT DOES NOT WEAR THEM CONSISTENTLY.   Marland Kitchen Hypercholesteremia   . Hypertension   . Hypothyroid   . Leaky heart valve    DR. HARWANI  . Mild obesity   . Osteoarthritis of knees, bilateral   . Osteoporosis 12/2017   T score -3.3  . Uterine polyp    H/O WITH BLEEDING---DR. FONTAINE  . Venous insufficiency     Patient Active Problem List   Diagnosis Date Noted  . Chronic diastolic heart failure (HCC) 09/25/2017  . Hypothyroid   . Essential hypertension   . Hypercholesteremia   . Osteopenia 04/27/2008    Past Surgical History:  Procedure Laterality Date  . CATARACT EXTRACTION, BILATERAL  2013   DR. SHAPIRO   . HYSTEROSCOPY     D & C  .  NEPHRECTOMY LIVING DONOR  1978     OB History    Gravida  5   Para  4   Term      Preterm      AB  1   Living  4     SAB  1   TAB      Ectopic      Multiple      Live Births               Home Medications    Prior to Admission medications   Medication Sig Start Date End Date Taking? Authorizing Provider  aspirin EC 81 MG tablet Take 162 mg by mouth as needed (chest pain).    Yes [provider]  calcium citrate-vitamin D (CITRACAL+D) 315-200 MG-UNIT per tablet Take 1 tablet by mouth daily.    Yes [provider]  Cholecalciferol (VITAMIN D) 2000 UNITS tablet Take 2,000 Units by mouth daily.     Yes [provider]  Coenzyme Q10 (CO Q 10 PO) Take 1 tablet by mouth daily.   Yes [provider]  hydrochlorothiazide (HYDRODIURIL) 25 MG tablet Take 1 tablet (25 mg total) by mouth daily. 01/07/18 04/07/18 Yes Lyn Records, MD  levothyroxine (SYNTHROID, LEVOTHROID) 125 MCG tablet Take 125 mcg by mouth daily before breakfast.  11/19/17  Yes [provider]  losartan (  COZAAR) 25 MG tablet Take 1 tablet (25 mg total) by mouth daily. 12/11/17 03/11/18 Yes Lyn Records, MD  magnesium oxide (MAG-OX) 400 MG tablet Take 400 mg by mouth daily.   Yes [provider]  Omega-3 Fatty Acids (FISH OIL PO) Take 1 tablet by mouth daily.   Yes [provider]  rosuvastatin (CRESTOR) 20 MG tablet Take 20 mg by mouth daily.   Yes [provider]    Family History Family History  Problem Relation Age of Onset  . Hypertension Mother   . CVA Mother   . Diabetes Sister   . Hypertension Sister   . Heart disease Sister   . Multiple sclerosis Sister   . Hypertension Father   . CVA Father   . Cancer Brother        prostrate  . Hypertension Brother   . CVA Brother   . Seizures Brother   . Pancreatic cancer Sister   . Kidney disease Sister   . Kidney failure Sister   . Heart attack Sister   . Other Brother         PNEUMONIA AS A BABY  . Hypertension Brother   . Breast cancer Neg Hx     Social History Social History   Tobacco Use  . Smoking status: Never Smoker  . Smokeless tobacco: Never Used  Substance Use Topics  . Alcohol use: No    Alcohol/week: 0.0 standard drinks  . Drug use: No     Allergies   Patient has no known allergies.   Review of Systems Review of Systems  Constitutional:       Per HPI, otherwise negative  HENT:       Per HPI, otherwise negative  Respiratory:       Per HPI, otherwise negative  Cardiovascular:       Per HPI, otherwise negative  Gastrointestinal: Negative for vomiting.  Endocrine:       Negative aside from HPI  Genitourinary:       Neg aside from HPI   Musculoskeletal:       Per HPI, otherwise negative  Skin: Negative.   Neurological: Positive for weakness. Negative for syncope.     Physical Exam Updated Vital Signs BP (!) 154/98 (BP Location: Left Arm)   Pulse 84   Temp 98.2 F (36.8 C) (Oral)   Resp 16   Wt 81.6 kg   SpO2 95%   BMI 32.92 kg/m   Physical Exam  Constitutional: She is oriented to person, place, and time. She appears well-developed and well-nourished. No distress.  HENT:  Head: Normocephalic and atraumatic.  Eyes: Conjunctivae and EOM are normal.  Cardiovascular: Normal rate and regular rhythm.  Pulmonary/Chest: Effort normal and breath sounds normal. No stridor. No respiratory distress.  Abdominal: She exhibits no distension.  Musculoskeletal: She exhibits no edema.  Neurological: She is alert and oriented to person, place, and time. No cranial nerve deficit.  Skin: Skin is warm and dry.  Psychiatric: She has a normal mood and affect.  Nursing note and vitals reviewed.    ED Treatments / Results  Labs (all labs ordered are listed, but only abnormal results are displayed) Labs Reviewed  POCT I-STAT TROPONIN I - Abnormal; Notable for the following components:      Result Value   Troponin i, poc 0.13 (*)      All other components within normal limits  CBC  BASIC METABOLIC PANEL  BRAIN NATRIURETIC PEPTIDE  I-STAT TROPONIN, ED  EKG  EKG with sinus rhythm, rate 83, baseline wander, no ST elevations, PVC, abnormal  Radiology Dg Chest 2 View  Result Date: 03/09/2018 CLINICAL DATA:  84 year old with left arm pain.  Chest pain. EXAM: CHEST - 2 VIEW COMPARISON:  06/01/2017 FINDINGS: Heart and mediastinum are stable and within normal limits. Atherosclerotic calcifications at the aortic arch. Trachea is midline. Lungs are clear without focal airspace disease or pulmonary edema. No large pleural effusions. Few densities in the posterior lower chest on the lateral view appear to represent overlying shadows. No acute bone abnormality. IMPRESSION: No active cardiopulmonary disease. Electronically Signed   By: Richarda Overlie M.D.   On: 03/09/2018 11:54    Procedures Procedures (including critical care time)  Medications Ordered in ED Medications - No data to display   Initial Impression / Assessment and Plan / ED Course  I have reviewed the triage vital signs and the nursing notes.  Pertinent labs & imaging results that were available during my care of the patient were reviewed by me and considered in my medical decision making (see chart for details).     Echocardiogram from cardiology with results as below: Study Conclusions   - Left ventricle: The cavity size was normal. Wall thickness was   increased in a pattern of mild LVH. Systolic function was normal.   The estimated ejection fraction was in the range of 55% to 60%.   Wall motion was normal; there were no regional wall motion   abnormalities. Doppler parameters are consistent with abnormal   left ventricular relaxation (grade 1 diastolic dysfunction). - Aortic valve: There was no stenosis. - Mitral valve: There was trivial regurgitation. - Left atrium: The atrium was mildly dilated. - Right ventricle: The cavity size was normal.  Systolic function   was normal. - Tricuspid valve: Peak RV-RA gradient (S): 43 mm Hg. - Pulmonary arteries: PA peak pressure: 46 mm Hg (S). - Inferior vena cava: The vessel was normal in size. The   respirophasic diameter changes were in the normal range (= 50%),   consistent with normal central venous pressure.   12:04 PM Initial labs notable for troponin 0 0.13. Patient's EKG is nonischemic, she has a history of diastolic heart failure, possibly contributing to her troponin elevation.  2:43 PM Second troponin slightly more elevated than initial value. Patient's condition is similar, minimal complaints, she has been ambulatory, without increasing pain with ambulation. I discussed patient's case with her cardiology colleagues, and they are available to follows a consulting team Without EKG changes, with a history of diastolic dysfunction, and without distress, no heparin currently indicated. However, given the patient's ongoing discomfort, mild troponin elevation, she will be admitted for further evaluation and management.  Elderly female without history of obstructive coronary disease, but with diastolic dysfunction presents with several days of ongoing left upper chest, left arm pain.  When here she is awake, alert, hemodynamically unremarkable aside from mild hypertension. Patient received aspirin, morphine, with improvement in her condition, but had 2 troponins that were abnormal, and required admission for further evaluation and management.  4:02 PM Patient's pain returned, in spite of initial analgesia, and given the elevated troponin sequence, her case was discussed again with cardiology, by our hospitalist colleague, and the patient is starting heparin, prior to admission.  Final Clinical Impressions(s) / ED Diagnoses  NSTEMI  CRITICAL CARE Performed by: Gerhard Munch Total critical care time: 35 minutes Critical care time was exclusive of separately billable procedures and  treating other  patients. Critical care was necessary to treat or prevent imminent or life-threatening deterioration. Critical care was time spent personally by me on the following activities: development of treatment plan with patient and/or surrogate as well as nursing, discussions with consultants, evaluation of patient's response to treatment, examination of patient, obtaining history from patient or surrogate, ordering and performing treatments and interventions, ordering and review of laboratory studies, ordering and review of radiographic studies, pulse oximetry and re-evaluation of patient's condition.    Gerhard Munch, MD 03/09/18 1448    Gerhard Munch, MD 03/09/18 [phone]

## 2018-03-10 ENCOUNTER — Encounter (HOSPITAL_COMMUNITY)
Admission: EM | Disposition: A | Discharge status: Home / Self Care | Payer: Self-pay | Source: Home / Self Care | Attending: Internal Medicine

## 2018-03-10 DIAGNOSIS — E785 Hyperlipidemia, unspecified: Secondary | ICD-10-CM

## 2018-03-10 HISTORY — PX: LEFT HEART CATH AND CORONARY ANGIOGRAPHY: CATH118249

## 2018-03-10 LAB — COMPREHENSIVE METABOLIC PANEL
ALT: 26 U/L (ref 0–44)
AST: 28 U/L (ref 15–41)
Albumin: 3.2 g/dL — ABNORMAL LOW (ref 3.5–5.0)
Alkaline Phosphatase: 45 U/L (ref 38–126)
Anion gap: 11 (ref 5–15)
BUN: 27 mg/dL — ABNORMAL HIGH (ref 8–23)
CO2: 23 mmol/L (ref 22–32)
Calcium: 8.7 mg/dL — ABNORMAL LOW (ref 8.9–10.3)
Chloride: 106 mmol/L (ref 98–111)
Creatinine, Ser: 1.01 mg/dL — ABNORMAL HIGH (ref 0.44–1.00)
GFR calc Af Amer: 58 mL/min — ABNORMAL LOW (ref >60)
GFR calc non Af Amer: 50 mL/min — ABNORMAL LOW (ref >60)
Glucose, Bld: 108 mg/dL — ABNORMAL HIGH (ref 70–99)
Potassium: 4.2 mmol/L (ref 3.5–5.1)
Sodium: 140 mmol/L (ref 135–145)
Total Bilirubin: 1 mg/dL (ref 0.3–1.2)
Total Protein: 6 g/dL — ABNORMAL LOW (ref 6.5–8.1)

## 2018-03-10 LAB — TROPONIN I
Troponin I: 0.39 ng/mL (ref <0.03)
Troponin I: 0.27 ng/mL (ref <0.03)
Troponin I: 0.19 ng/mL (ref <0.03)

## 2018-03-10 LAB — CBC
HCT: 41.3 % (ref 36.0–46.0)
Hemoglobin: 13 g/dL (ref 12.0–15.0)
MCH: 30.1 pg (ref 26.0–34.0)
MCHC: 31.5 g/dL (ref 30.0–36.0)
MCV: 95.6 fL (ref 80.0–100.0)
Platelets: 145 10*3/uL — ABNORMAL LOW (ref 150–400)
RBC: 4.32 MIL/uL (ref 3.87–5.11)
RDW: 13.5 % (ref 11.5–15.5)
WBC: 5 10*3/uL (ref 4.0–10.5)
nRBC: 0 % (ref 0.0–0.2)

## 2018-03-10 LAB — HEPARIN LEVEL (UNFRACTIONATED): Heparin Unfractionated: 0.41 IU/mL (ref 0.30–0.70)

## 2018-03-10 SURGERY — LEFT HEART CATH AND CORONARY ANGIOGRAPHY
Anesthesia: LOCAL

## 2018-03-10 MED ORDER — MIDAZOLAM HCL 2 MG/2ML IJ SOLN
INTRAMUSCULAR | Status: DC | PRN
  Administered 2018-03-10 (×2): 0.5 mg via INTRAVENOUS

## 2018-03-10 MED ORDER — METOPROLOL TARTRATE 12.5 MG HALF TABLET
12.5000 mg | ORAL_TABLET | Freq: Two times a day (BID) | ORAL | Status: DC
  Administered 2018-03-11 – 2018-03-12 (×3): 12.5 mg via ORAL
  Filled 2018-03-10 (×4): qty 1

## 2018-03-10 MED ORDER — LIDOCAINE HCL (PF) 1 % IJ SOLN
INTRAMUSCULAR | Status: DC | PRN
  Administered 2018-03-10: 2 mL

## 2018-03-10 MED ORDER — SODIUM CHLORIDE 0.9% FLUSH
3.0000 mL | Freq: Two times a day (BID) | INTRAVENOUS | Status: DC
  Administered 2018-03-11 – 2018-03-12 (×3): 3 mL via INTRAVENOUS

## 2018-03-10 MED ORDER — ONDANSETRON HCL 4 MG/2ML IJ SOLN: 4.0000 mg | Freq: Four times a day (QID) | INTRAMUSCULAR | Status: DC | PRN

## 2018-03-10 MED ORDER — CLOPIDOGREL BISULFATE 75 MG PO TABS
75.0000 mg | ORAL_TABLET | Freq: Every day | ORAL | Status: DC
  Administered 2018-03-11 – 2018-03-12 (×2): 75 mg via ORAL
  Filled 2018-03-10 (×2): qty 1

## 2018-03-10 MED ORDER — MAGNESIUM HYDROXIDE 400 MG/5ML PO SUSP
30.0000 mL | Freq: Every day | ORAL | Status: DC | PRN
  Administered 2018-03-10 – 2018-03-11 (×2): 30 mL via ORAL
  Filled 2018-03-10 (×3): qty 30

## 2018-03-10 MED ORDER — HEPARIN SODIUM (PORCINE) 1000 UNIT/ML IJ SOLN
INTRAMUSCULAR | Status: DC | PRN
  Administered 2018-03-10: 4000 [IU] via INTRAVENOUS

## 2018-03-10 MED ORDER — METOPROLOL TARTRATE 5 MG/5ML IV SOLN
INTRAVENOUS | Status: AC
  Filled 2018-03-10: qty 5

## 2018-03-10 MED ORDER — METOPROLOL TARTRATE 5 MG/5ML IV SOLN
INTRAVENOUS | Status: DC | PRN
  Administered 2018-03-10 (×3): 5 mg via INTRAVENOUS

## 2018-03-10 MED ORDER — MIDAZOLAM HCL 2 MG/2ML IJ SOLN
INTRAMUSCULAR | Status: AC
  Filled 2018-03-10: qty 2

## 2018-03-10 MED ORDER — LIDOCAINE HCL (PF) 1 % IJ SOLN
INTRAMUSCULAR | Status: AC
  Filled 2018-03-10: qty 30

## 2018-03-10 MED ORDER — SODIUM CHLORIDE 0.9% FLUSH: 3.0000 mL | INTRAVENOUS | Status: DC | PRN

## 2018-03-10 MED ORDER — FENTANYL CITRATE (PF) 100 MCG/2ML IJ SOLN
INTRAMUSCULAR | Status: DC | PRN
  Administered 2018-03-10: 25 ug via INTRAVENOUS

## 2018-03-10 MED ORDER — FENTANYL CITRATE (PF) 100 MCG/2ML IJ SOLN
INTRAMUSCULAR | Status: AC
  Filled 2018-03-10: qty 2

## 2018-03-10 MED ORDER — SODIUM CHLORIDE 0.9 % IV SOLN: 250.0000 mL | INTRAVENOUS | Status: DC | PRN

## 2018-03-10 MED ORDER — HEPARIN (PORCINE) IN NACL 1000-0.9 UT/500ML-% IV SOLN
INTRAVENOUS | Status: DC | PRN
  Administered 2018-03-10 (×2): 500 mL

## 2018-03-10 MED ORDER — HEPARIN (PORCINE) IN NACL 1000-0.9 UT/500ML-% IV SOLN
INTRAVENOUS | Status: AC
  Filled 2018-03-10: qty 1000

## 2018-03-10 MED ORDER — HEPARIN (PORCINE) IN NACL 100-0.45 UNIT/ML-% IJ SOLN
800.0000 [IU]/h | INTRAMUSCULAR | Status: DC
  Administered 2018-03-10: 800 [IU]/h via INTRAVENOUS
  Filled 2018-03-10: qty 250

## 2018-03-10 MED ORDER — VERAPAMIL HCL 2.5 MG/ML IV SOLN
INTRAVENOUS | Status: AC
  Filled 2018-03-10: qty 2

## 2018-03-10 MED ORDER — OXYCODONE HCL 5 MG PO TABS: 5.0000 mg | ORAL_TABLET | ORAL | Status: DC | PRN

## 2018-03-10 MED ORDER — VERAPAMIL HCL 2.5 MG/ML IV SOLN
INTRAVENOUS | Status: DC | PRN
  Administered 2018-03-10: 10 mL via INTRA_ARTERIAL

## 2018-03-10 MED ORDER — SODIUM CHLORIDE 0.9 % WEIGHT BASED INFUSION
1.0000 mL/kg/h | INTRAVENOUS | Status: AC
  Administered 2018-03-10: 1 mL/kg/h via INTRAVENOUS

## 2018-03-10 MED ORDER — ACETAMINOPHEN 325 MG PO TABS: 650.0000 mg | ORAL_TABLET | ORAL | Status: DC | PRN

## 2018-03-10 MED ORDER — IOHEXOL 350 MG/ML SOLN
INTRAVENOUS | Status: DC | PRN
  Administered 2018-03-10: 70 mL via INTRA_ARTERIAL

## 2018-03-10 SURGICAL SUPPLY — 11 items
CATH INFINITI 5 FR JL3.5 (CATHETERS) ×2 IMPLANT
CATH INFINITI JR4 5F (CATHETERS) ×2 IMPLANT
DEVICE RAD COMP TR BAND LRG (VASCULAR PRODUCTS) ×2 IMPLANT
GLIDESHEATH SLEND A-KIT 6F 22G (SHEATH) ×2 IMPLANT
GUIDEWIRE INQWIRE 1.5J.035X260 (WIRE) ×1 IMPLANT
INQWIRE 1.5J .035X260CM (WIRE) ×2
KIT HEART LEFT (KITS) ×2 IMPLANT
PACK CARDIAC CATHETERIZATION (CUSTOM PROCEDURE TRAY) ×2 IMPLANT
SHEATH PROBE COVER 6X72 (BAG) ×2 IMPLANT
TRANSDUCER W/STOPCOCK (MISCELLANEOUS) ×4 IMPLANT
TUBING CIL FLEX 10 FLL-RA (TUBING) ×2 IMPLANT

## 2018-03-10 NOTE — Progress Notes (Signed)
ANTICOAGULATION CONSULT NOTE   Pharmacy Consult for Heparin Indication: chest pain/ACS  No Known Allergies  Patient Measurements: Height: 5\' 2"  (157.5 cm) Weight: 184 lb 4.9 oz (83.6 kg) IBW/kg (Calculated) : 50.1 Heparin Dosing Weight: 68 kg  Vital Signs: Temp: 97.8 F (36.6 C) (10/14 1048) Temp Source: Oral (10/14 1048) BP: 130/93 (10/14 1457) Pulse Rate: 68 (10/14 1457)  Labs: Recent Labs    03/09/18 1136 03/10/18 0040 03/10/18 0228 03/10/18 0647 03/10/18 1214  HGB 14.6  --   --  13.0  --   HCT 45.8  --   --  41.3  --   PLT 152  --   --  145*  --   HEPARINUNFRC  --   --  0.41  --   --   CREATININE 0.98  --   --  1.01*  --   TROPONINI  --  0.39*  --  0.27* 0.19*    Estimated Creatinine Clearance: 41.6 mL/min (A) (by C-G formula based on SCr of 1.01 mg/dL (H)).   Medical History: Past Medical History:  Diagnosis Date  . Allergic rhinitis   . Anxiety   . Bunion    PES PLANUS  . Cervical disc disease   . Hearing loss    HEARING AIDS, BUT DOES NOT WEAR THEM CONSISTENTLY.   Marland Kitchen Hypercholesteremia   . Hypertension   . Hypothyroid   . Leaky heart valve    DR. HARWANI  . Mild obesity   . Osteoarthritis of knees, bilateral   . Osteoporosis 12/2017   T score -3.3  . Uterine polyp    H/O WITH BLEEDING---DR. FONTAINE  . Venous insufficiency     Medications:  No oral anticoagulation PTA  Assessment: 84 yr female presents with radiating chest pain.  Elevated troponins.  Pharmacy consulted to dose IV heparin for ACS/STEMI.  Initial heparin level is therapeutic on 800 units/hr. Underwent cardiac cath finding no obstructive CAD and fusiform aneurysm involving prox-mid LAD. Did have SVT during cath lab procedure. Plan to restart heparin infusion 8 hours after sheath removed (documented at 1459). Will resume at previous rate. Hgb 13, plt 145. No s/sx of bleeding.   Goal of Therapy:  Heparin level 0.3-0.7 units/ml Monitor platelets by anticoagulation protocol:  Yes   Plan:  Restart heparin infusion at 800 units/hr at 2300 Obtain heparin level 8 hours after restart Monitor daily HL, CBC, and s/sx of bleeding  Girard Cooter, PharmD Clinical Pharmacist  Pager: [phone] Phone: [phone]

## 2018-03-10 NOTE — Progress Notes (Signed)
Patient transferred to Cath room 3 with no distress noted. Family aware. Will transfer care at this time.

## 2018-03-10 NOTE — Plan of Care (Signed)

## 2018-03-10 NOTE — Progress Notes (Addendum)
 Progress Note  Patient Name: Lynn Brown Date of Encounter: 03/10/2018  Primary Cardiologist: Henry W Smith III, MD   Subjective   No complaints currently. CP free. No CP since last night. Awaiting LHC.   Inpatient Medications    Scheduled Meds: . aspirin EC  81 mg Oral Daily  . levothyroxine  125 mcg Oral QAC breakfast  . magnesium oxide  400 mg Oral Daily  . rosuvastatin  20 mg Oral Daily  . sodium chloride flush  3 mL Intravenous Q12H   Continuous Infusions: . sodium chloride 81.6 mL/hr at 03/10/18 0800  . sodium chloride 1 mL/kg/hr (03/10/18 0544)  . heparin 800 Units/hr (03/10/18 0800)   PRN Meds: sodium chloride, nitroGLYCERIN, sodium chloride flush   Vital Signs    Vitals:   03/10/18 0420 03/10/18 0500 03/10/18 0724 03/10/18 1048  BP:   114/83 137/84  Pulse:   61 (!) 56  Resp:   20 14  Temp: (!) 97.5 F (36.4 C)  (!) 97.5 F (36.4 C) 97.8 F (36.6 C)  TempSrc: Oral  Oral Oral  SpO2:   96% 96%  Weight:  83.6 kg      Intake/Output Summary (Last 24 hours) at 03/10/2018 1250 Last data filed at 03/10/2018 1030 Gross per 24 hour  Intake 620.25 ml  Output 1300 ml  Net -679.75 ml   Filed Weights   03/09/18 1103 03/10/18 0500  Weight: 81.6 kg 83.6 kg    Telemetry    NSR with PACs, - Personally Reviewed  ECG    SR, PACs and PVCs- Personally Reviewed  Physical Exam   GEN: elderly BF in No acute distress.   Neck: No JVD Cardiac: RRR, no murmurs, rubs, or gallops.  Respiratory: Clear to auscultation bilaterally. GI: Soft, nontender, non-distended  MS: No edema; No deformity. Neuro:  Nonfocal  Psych: Normal affect   Labs    Chemistry Recent Labs  Lab 03/09/18 1136 03/10/18 0647  NA 141 140  K 3.9 4.2  CL 102 106  CO2 28 23  GLUCOSE 117* 108*  BUN 27* 27*  CREATININE 0.98 1.01*  CALCIUM 9.5 8.7*  PROT  --  6.0*  ALBUMIN  --  3.2*  AST  --  28  ALT  --  26  ALKPHOS  --  45  BILITOT  --  1.0  GFRNONAA 52* 50*  GFRAA 60* 58*   ANIONGAP 11 11     Hematology Recent Labs  Lab 03/09/18 1136 03/10/18 0647  WBC 4.2 5.0  RBC 4.78 4.32  HGB 14.6 13.0  HCT 45.8 41.3  MCV 95.8 95.6  MCH 30.5 30.1  MCHC 31.9 31.5  RDW 13.5 13.5  PLT 152 145*    Cardiac Enzymes Recent Labs  Lab 03/10/18 0040 03/10/18 0647  TROPONINI 0.39* 0.27*    Recent Labs  Lab 03/09/18 1144 03/09/18 1429  TROPIPOC 0.13* 0.49*     BNP Recent Labs  Lab 03/09/18 1138  BNP 74.6     DDimer No results for input(s): DDIMER in the last 168 hours.   Radiology    Dg Chest 2 View  Result Date: 03/09/2018 CLINICAL DATA:  84-year-old with left arm pain.  Chest pain. EXAM: CHEST - 2 VIEW COMPARISON:  06/01/2017 FINDINGS: Heart and mediastinum are stable and within normal limits. Atherosclerotic calcifications at the aortic arch. Trachea is midline. Lungs are clear without focal airspace disease or pulmonary edema. No large pleural effusions. Few densities in the posterior lower chest   on the lateral view appear to represent overlying shadows. No acute bone abnormality. IMPRESSION: No active cardiopulmonary disease. Electronically Signed   By: Adam  Henn M.D.   On: 03/09/2018 11:54    Cardiac Studies   LHC pending   2D Echo 09/25/17 Study Conclusions  - Left ventricle: The cavity size was normal. Wall thickness was   increased in a pattern of mild LVH. Systolic function was normal.   The estimated ejection fraction was in the range of 55% to 60%.   Wall motion was normal; there were no regional wall motion   abnormalities. Doppler parameters are consistent with abnormal   left ventricular relaxation (grade 1 diastolic dysfunction). - Aortic valve: There was no stenosis. - Mitral valve: There was trivial regurgitation. - Left atrium: The atrium was mildly dilated. - Right ventricle: The cavity size was normal. Systolic function   was normal. - Tricuspid valve: Peak RV-RA gradient (S): 43 mm Hg. - Pulmonary arteries: PA peak  pressure: 46 mm Hg (S). - Inferior vena cava: The vessel was normal in size. The   respirophasic diameter changes were in the normal range (= 50%),   consistent with normal central venous pressure.  Impressions:  - Normal LV size with mild LV hypertrophy. EF 55-60%. Normal RV   size and systolic function. No significant valvular   abnormalities. Mild pulmonary hypertension.  Patient Profile     Lynn Brown is a 84 y.o. female with hypertension, prior history of nephrectomy as a kidney donor, hyperlipidemia, chronic lower extremity edema, hyperlipidemia, hypothyroidism admitted for CP and ruled in for NSTEMI.   Assessment & Plan    1. NSTEMI: troponin peaked at 0.39. Currently CP free. Plan LHC +/- PCI today. Continue on IV heparin, ASA and statin.   2. Solitary Kidney: s/p nephrectomy as a kidney donor. SCr 1.01 today. Monitor renal function closely post cath.   3. HLD: no lipid panel on file. Will order FLP to assess baseline LDL. Has been on Crestor 20 mg as outpatient and ordered on admit. Will adjust dose if not at goal of < 70 mg/dL.   4. HTN: controlled this morning at 137/84.   For questions or updates, please contact [facility] Please consult [email or web address] for contact info under        Signed, Brittainy Simmons, PA-C  03/10/2018, 12:50 PM    As above, patient seen and examined.  Patient denies chest pain or dyspnea.  Troponins are abnormal.  Plan for cardiac catheterization today.  The risks and benefits including myocardial infarction, CVA and death discussed and she agrees to proceed.  Continue aspirin, heparin and statin.  Add low-dose metoprolol.  Echocardiogram for LV function.  Limit dye and follow renal function following procedure given history of nephrectomy.  Navy Rothschild, MD   

## 2018-03-10 NOTE — Progress Notes (Signed)
ANTICOAGULATION CONSULT NOTE   Pharmacy Consult for Heparin Indication: chest pain/ACS  No Known Allergies  Patient Measurements: Weight: 180 lb (81.6 kg) Heparin Dosing Weight: 68 kg  Vital Signs: Temp: 98.1 F (36.7 C) (10/13 2344) Temp Source: Oral (10/13 2344) BP: 78/59 (10/13 1952) Pulse Rate: 84 (10/13 1630)  Labs: Recent Labs    03/09/18 1136 03/10/18 0040 03/10/18 0228  HGB 14.6  --   --   HCT 45.8  --   --   PLT 152  --   --   HEPARINUNFRC  --   --  0.41  CREATININE 0.98  --   --   TROPONINI  --  0.39*  --     Estimated Creatinine Clearance: 42.3 mL/min (by C-G formula based on SCr of 0.98 mg/dL).   Medical History: Past Medical History:  Diagnosis Date  . Allergic rhinitis   . Anxiety   . Bunion    PES PLANUS  . Cervical disc disease   . Hearing loss    HEARING AIDS, BUT DOES NOT WEAR THEM CONSISTENTLY.   Marland Kitchen Hypercholesteremia   . Hypertension   . Hypothyroid   . Leaky heart valve    DR. HARWANI  . Mild obesity   . Osteoarthritis of knees, bilateral   . Osteoporosis 12/2017   T score -3.3  . Uterine polyp    H/O WITH BLEEDING---DR. FONTAINE  . Venous insufficiency     Medications:  No oral anticoagulation PTA  Assessment:  84 yr female presents with radiating chest pain.  Elevated troponins.  Pharmacy consulted to dose IV heparin for ACS/STEMI  Initial heparin level is therapeutic   Goal of Therapy:  Heparin level 0.3-0.7 units/ml Monitor platelets by anticoagulation protocol: Yes   Plan:  Cont heparin at 800 units/hr 1200 confirmatory heparin level  Abran Duke, PharmD, BCPS Clinical Pharmacist Phone: [phone]

## 2018-03-10 NOTE — Interval H&P Note (Signed)
History and Physical Interval Note:  03/10/2018 2:04 PM  Lynn Brown  has presented today for surgery, with the diagnosis of NSTEMI  The various methods of treatment have been discussed with the patient and family. After consideration of risks, benefits and other options for treatment, the patient has consented to  Procedure(s): LEFT HEART CATH AND CORONARY ANGIOGRAPHY (N/A) as a surgical intervention .  The patient's history has been reviewed, patient examined, no change in status, stable for surgery.  I have reviewed the patient's chart and labs.  Questions were answered to the patient's satisfaction.     Lyn Records III

## 2018-03-10 NOTE — H&P (View-Only) (Signed)
Progress Note  Patient Name: Lynn Brown Date of Encounter: 03/10/2018  Primary Cardiologist: Lesleigh Noe, MD   Subjective   No complaints currently. CP free. No CP since last night. Awaiting LHC.   Inpatient Medications    Scheduled Meds: . aspirin EC  81 mg Oral Daily  . levothyroxine  125 mcg Oral QAC breakfast  . magnesium oxide  400 mg Oral Daily  . rosuvastatin  20 mg Oral Daily  . sodium chloride flush  3 mL Intravenous Q12H   Continuous Infusions: . sodium chloride 81.6 mL/hr at 03/10/18 0800  . sodium chloride 1 mL/kg/hr (03/10/18 0544)  . heparin 800 Units/hr (03/10/18 0800)   PRN Meds: sodium chloride, nitroGLYCERIN, sodium chloride flush   Vital Signs    Vitals:   03/10/18 0420 03/10/18 0500 03/10/18 0724 03/10/18 1048  BP:   114/83 137/84  Pulse:   61 (!) 56  Resp:   20 14  Temp: (!) 97.5 F (36.4 C)  (!) 97.5 F (36.4 C) 97.8 F (36.6 C)  TempSrc: Oral  Oral Oral  SpO2:   96% 96%  Weight:  83.6 kg      Intake/Output Summary (Last 24 hours) at 03/10/2018 1250 Last data filed at 03/10/2018 1030 Gross per 24 hour  Intake 620.25 ml  Output 1300 ml  Net -679.75 ml   Filed Weights   03/09/18 1103 03/10/18 0500  Weight: 81.6 kg 83.6 kg    Telemetry    NSR with PACs, - Personally Reviewed  ECG    SR, PACs and PVCs- Personally Reviewed  Physical Exam   GEN: elderly BF in No acute distress.   Neck: No JVD Cardiac: RRR, no murmurs, rubs, or gallops.  Respiratory: Clear to auscultation bilaterally. GI: Soft, nontender, non-distended  MS: No edema; No deformity. Neuro:  Nonfocal  Psych: Normal affect   Labs    Chemistry Recent Labs  Lab 03/09/18 1136 03/10/18 0647  NA 141 140  K 3.9 4.2  CL 102 106  CO2 28 23  GLUCOSE 117* 108*  BUN 27* 27*  CREATININE 0.98 1.01*  CALCIUM 9.5 8.7*  PROT  --  6.0*  ALBUMIN  --  3.2*  AST  --  28  ALT  --  26  ALKPHOS  --  45  BILITOT  --  1.0  GFRNONAA 52* 50*  GFRAA 60* 58*   ANIONGAP 11 11     Hematology Recent Labs  Lab 03/09/18 1136 03/10/18 0647  WBC 4.2 5.0  RBC 4.78 4.32  HGB 14.6 13.0  HCT 45.8 41.3  MCV 95.8 95.6  MCH 30.5 30.1  MCHC 31.9 31.5  RDW 13.5 13.5  PLT 152 145*    Cardiac Enzymes Recent Labs  Lab 03/10/18 0040 03/10/18 0647  TROPONINI 0.39* 0.27*    Recent Labs  Lab 03/09/18 1144 03/09/18 1429  TROPIPOC 0.13* 0.49*     BNP Recent Labs  Lab 03/09/18 1138  BNP 74.6     DDimer No results for input(s): DDIMER in the last 168 hours.   Radiology    Dg Chest 2 View  Result Date: 03/09/2018 CLINICAL DATA:  84 year old with left arm pain.  Chest pain. EXAM: CHEST - 2 VIEW COMPARISON:  06/01/2017 FINDINGS: Heart and mediastinum are stable and within normal limits. Atherosclerotic calcifications at the aortic arch. Trachea is midline. Lungs are clear without focal airspace disease or pulmonary edema. No large pleural effusions. Few densities in the posterior lower chest  on the lateral view appear to represent overlying shadows. No acute bone abnormality. IMPRESSION: No active cardiopulmonary disease. Electronically Signed   By: Richarda Overlie M.D.   On: 03/09/2018 11:54    Cardiac Studies   LHC pending   2D Echo 09/25/17 Study Conclusions  - Left ventricle: The cavity size was normal. Wall thickness was   increased in a pattern of mild LVH. Systolic function was normal.   The estimated ejection fraction was in the range of 55% to 60%.   Wall motion was normal; there were no regional wall motion   abnormalities. Doppler parameters are consistent with abnormal   left ventricular relaxation (grade 1 diastolic dysfunction). - Aortic valve: There was no stenosis. - Mitral valve: There was trivial regurgitation. - Left atrium: The atrium was mildly dilated. - Right ventricle: The cavity size was normal. Systolic function   was normal. - Tricuspid valve: Peak RV-RA gradient (S): 43 mm Hg. - Pulmonary arteries: PA peak  pressure: 46 mm Hg (S). - Inferior vena cava: The vessel was normal in size. The   respirophasic diameter changes were in the normal range (= 50%),   consistent with normal central venous pressure.  Impressions:  - Normal LV size with mild LV hypertrophy. EF 55-60%. Normal RV   size and systolic function. No significant valvular   abnormalities. Mild pulmonary hypertension.  Patient Profile     Lynn Brown is a 84 y.o. female with hypertension, prior history of nephrectomy as a kidney donor, hyperlipidemia, chronic lower extremity edema, hyperlipidemia, hypothyroidism admitted for CP and ruled in for NSTEMI.   Assessment & Plan    1. NSTEMI: troponin peaked at 0.39. Currently CP free. Plan LHC +/- PCI today. Continue on IV heparin, ASA and statin.   2. Solitary Kidney: s/p nephrectomy as a kidney donor. SCr 1.01 today. Monitor renal function closely post cath.   3. HLD: no lipid panel on file. Will order FLP to assess baseline LDL. Has been on Crestor 20 mg as outpatient and ordered on admit. Will adjust dose if not at goal of < 70 mg/dL.   4. HTN: controlled this morning at 137/84.   For questions or updates, please contact [facility] Please consult [email or web address] for contact info under        Signed, Robbie Lis, PA-C  03/10/2018, 12:50 PM    As above, patient seen and examined.  Patient denies chest pain or dyspnea.  Troponins are abnormal.  Plan for cardiac catheterization today.  The risks and benefits including myocardial infarction, CVA and death discussed and she agrees to proceed.  Continue aspirin, heparin and statin.  Add low-dose metoprolol.  Echocardiogram for LV function.  Limit dye and follow renal function following procedure given history of nephrectomy.  Olga Millers, MD

## 2018-03-10 NOTE — Progress Notes (Signed)
PROGRESS NOTE    Lynn Brown  VHQ:[id number] DOB: 1931-10-02 DOA: 03/09/2018 PCP: Laurann Montana, MD    Brief Narrative: : Lynn Brown is a 84 y.o. female with medical history significant of hypertension, hypothyroidism, anxiety, hyperlipidemia multiple other medical problems presenting with chest pain.  She notes her symptoms started sometime in the past week.  She describes her symptoms has left arm pain which has been on and off for the past week as well as a left breast pain, left subscap pain and substernal pressure.  She notes this is coming gone, but this morning it is worse.  She notes persistent pain at this time after the morphine with persistent pressure.  She states that she came to the emergency department because of the pain is also her blood pressure was up and down.  She denies any fevers, chills, cough, cold, sweating, numbness, nausea, vomiting, abdominal pain.  She does note some palpitations.   Assessment & Plan:   Active Problems:   NSTEMI (non-ST elevated myocardial infarction) (HCC)  1-STEMI;  Presents with chest pain, mildly elevated troponin,  Continue with heparin gtt, aspirin , statins.  Cath today.  Chest pain free.  2-Hypotension;  Hold HCTZ, cozaar.  Stable this am.   HTN; hold cozaar and HCTZ. SBP low.    Hypothyroidism; Continue with synthroid.   HLD;  Continue with Crestor.     DVT prophylaxis: Heparin  Code Status: full code.  Family Communication: family at bedside.  Disposition Plan: remain inpatient for treatment of NSTEMI, cath. IV heparin.   Consultants:   Cardiology   Procedures:  Cath 10-14   Antimicrobials:     Subjective: Report intermittent chest pain for last few days.  Last   Objective: Vitals:   03/09/18 1952 03/09/18 2344 03/10/18 0420 03/10/18 0500  BP: (!) 78/59     Pulse:      Resp:      Temp: (!) 97.3 F (36.3 C) 98.1 F (36.7 C) (!) 97.5 F (36.4 C)   TempSrc: Axillary Oral Oral   SpO2:       Weight:    83.6 kg   No intake or output data in the 24 hours ending 03/10/18 0721 Filed Weights   03/09/18 1103 03/10/18 0500  Weight: 81.6 kg 83.6 kg    Examination:  General exam: NAD Respiratory system: CTA Cardiovascular system:  S 1, S 2 RRR Gastrointestinal system:BS present, soft, nt Central nervous system: non focal.  Extremities: Symmetric power.  Skin: No rashes    Data Reviewed: I have personally reviewed following labs and imaging studies  CBC: Recent Labs  Lab 03/09/18 1136  WBC 4.2  HGB 14.6  HCT 45.8  MCV 95.8  PLT 152   Basic Metabolic Panel: Recent Labs  Lab 03/09/18 1136  NA 141  K 3.9  CL 102  CO2 28  GLUCOSE 117*  BUN 27*  CREATININE 0.98  CALCIUM 9.5   GFR: Estimated Creatinine Clearance: 42.8 mL/min (by C-G formula based on SCr of 0.98 mg/dL). Liver Function Tests: No results for input(s): AST, ALT, ALKPHOS, BILITOT, PROT, ALBUMIN in the last 168 hours. No results for input(s): LIPASE, AMYLASE in the last 168 hours. No results for input(s): AMMONIA in the last 168 hours. Coagulation Profile: No results for input(s): INR, PROTIME in the last 168 hours. Cardiac Enzymes: Recent Labs  Lab 03/10/18 0040  TROPONINI 0.39*   BNP (last 3 results) Recent Labs    09/25/17 0943  PROBNP 43  HbA1C: No results for input(s): HGBA1C in the last 72 hours. CBG: No results for input(s): GLUCAP in the last 168 hours. Lipid Profile: No results for input(s): CHOL, HDL, LDLCALC, TRIG, CHOLHDL, LDLDIRECT in the last 72 hours. Thyroid Function Tests: No results for input(s): TSH, T4TOTAL, FREET4, T3FREE, THYROIDAB in the last 72 hours. Anemia Panel: No results for input(s): VITAMINB12, FOLATE, FERRITIN, TIBC, IRON, RETICCTPCT in the last 72 hours. Sepsis Labs: No results for input(s): PROCALCITON, LATICACIDVEN in the last 168 hours.  Recent Results (from the past 240 hour(s))  MRSA PCR Screening     Status: None   Collection Time:  03/09/18  8:00 PM  Result Value Ref Range Status   MRSA by PCR NEGATIVE NEGATIVE Final    Comment:        The GeneXpert MRSA Assay (FDA approved for NASAL specimens only), is one component of a comprehensive MRSA colonization surveillance program. It is not intended to diagnose MRSA infection nor to guide or monitor treatment for MRSA infections. Performed at [facility], [address] [place], [place] [address]          Radiology Studies: Dg Chest 2 View  Result Date: 03/09/2018 CLINICAL DATA:  84 year old with left arm pain.  Chest pain. EXAM: CHEST - 2 VIEW COMPARISON:  06/01/2017 FINDINGS: Heart and mediastinum are stable and within normal limits. Atherosclerotic calcifications at the aortic arch. Trachea is midline. Lungs are clear without focal airspace disease or pulmonary edema. No large pleural effusions. Few densities in the posterior lower chest on the lateral view appear to represent overlying shadows. No acute bone abnormality. IMPRESSION: No active cardiopulmonary disease. Electronically Signed   By: Richarda Overlie M.D.   On: 03/09/2018 11:54        Scheduled Meds: . aspirin EC  81 mg Oral Daily  . hydrochlorothiazide  25 mg Oral Daily  . levothyroxine  125 mcg Oral QAC breakfast  . magnesium oxide  400 mg Oral Daily  . rosuvastatin  20 mg Oral Daily  . sodium chloride flush  3 mL Intravenous Q12H   Continuous Infusions: . sodium chloride 250 mL (03/10/18 0414)  . sodium chloride 1 mL/kg/hr (03/10/18 0544)  . heparin 800 Units/hr (03/09/18 1643)     LOS: 1 day    Time spent: 35 minutes.     Alba Cory, MD [facility] Pager [phone]  If 7PM-7AM, please contact night-coverage [email or web address] Password [email or web address] 03/10/2018, 7:21 AM

## 2018-03-11 ENCOUNTER — Other Ambulatory Visit: Payer: Self-pay

## 2018-03-11 ENCOUNTER — Encounter (HOSPITAL_COMMUNITY): Payer: Self-pay | Admitting: Interventional Cardiology

## 2018-03-11 LAB — CBC
HCT: 39.5 % (ref 36.0–46.0)
Hemoglobin: 12.8 g/dL (ref 12.0–15.0)
MCH: 30.9 pg (ref 26.0–34.0)
MCHC: 32.4 g/dL (ref 30.0–36.0)
MCV: 95.4 fL (ref 80.0–100.0)
Platelets: 141 10*3/uL — ABNORMAL LOW (ref 150–400)
RBC: 4.14 MIL/uL (ref 3.87–5.11)
RDW: 13.7 % (ref 11.5–15.5)
WBC: 4.9 10*3/uL (ref 4.0–10.5)
nRBC: 0 % (ref 0.0–0.2)

## 2018-03-11 LAB — BASIC METABOLIC PANEL
Anion gap: 13 (ref 5–15)
BUN: 26 mg/dL — ABNORMAL HIGH (ref 8–23)
CO2: 23 mmol/L (ref 22–32)
Calcium: 9 mg/dL (ref 8.9–10.3)
Chloride: 105 mmol/L (ref 98–111)
Creatinine, Ser: 0.96 mg/dL (ref 0.44–1.00)
GFR calc Af Amer: >60 mL/min (ref >60)
GFR calc non Af Amer: 53 mL/min — ABNORMAL LOW (ref >60)
Glucose, Bld: 103 mg/dL — ABNORMAL HIGH (ref 70–99)
Potassium: 4.4 mmol/L (ref 3.5–5.1)
Sodium: 141 mmol/L (ref 135–145)

## 2018-03-11 LAB — HEPARIN LEVEL (UNFRACTIONATED): Heparin Unfractionated: 0.24 IU/mL — ABNORMAL LOW (ref 0.30–0.70)

## 2018-03-11 MED ORDER — APIXABAN 5 MG PO TABS
5.0000 mg | ORAL_TABLET | Freq: Two times a day (BID) | ORAL | Status: DC
  Administered 2018-03-11 – 2018-03-12 (×3): 5 mg via ORAL
  Filled 2018-03-11 (×3): qty 1

## 2018-03-11 MED ORDER — WHITE PETROLATUM EX OINT
TOPICAL_OINTMENT | CUTANEOUS | Status: AC
  Administered 2018-03-11: 14:00:00
  Filled 2018-03-11: qty 28.35

## 2018-03-11 MED ORDER — SENNOSIDES-DOCUSATE SODIUM 8.6-50 MG PO TABS
1.0000 | ORAL_TABLET | Freq: Every day | ORAL | Status: DC
  Administered 2018-03-11 – 2018-03-12 (×2): 1 via ORAL
  Filled 2018-03-11 (×2): qty 1

## 2018-03-11 NOTE — Evaluation (Addendum)
Physical Therapy Evaluation Patient Details Name: Lynn Brown MRN: [id number] DOB: 1931/10/18 Today's Date: 03/11/2018   History of Present Illness   Lynn Brown is a 84 y.o. female with medical history significant of hypertension, hypothyroidism, anxiety, hyperlipidemia multiple other medical problems presenting with chest pain, positive for NSTEMI.  Clinical Impression  Patient presents with decreased mobility due to bedrest with recent NSTEMI and she will benefit from skilled PT in the acute setting to ensure safety on stairs prior to d/c home.  No current follow up PT needs, but feel she may be a candidate for outpatient cardiac rehab program.  Vitals Pre-ambulation:HR: 63 bpm  BP:126/82 Vitals Post-ambulation: HR: 88 bpm  BP:148/95    Follow Up Recommendations No PT follow up;Other (comment)(consider outpatient cardiac rehab)    Equipment Recommendations  None recommended by PT    Recommendations for Other Services       Precautions / Restrictions Precautions Precautions: Fall      Mobility  Bed Mobility Overal bed mobility: Modified Independent                Transfers Overall transfer level: Needs assistance Equipment used: None Transfers: Sit to/from Stand Sit to Stand: Supervision         General transfer comment: for safety with lines, etc  Ambulation/Gait Ambulation/Gait assistance: Min guard Gait Distance (Feet): 200 Feet Assistive device: None Gait Pattern/deviations: Step-through pattern;Decreased stride length;Wide base of support     General Gait Details: reports difficulty walking without shoes due to flat feet, but no LOB noted with ambulation without device  Stairs            Wheelchair Mobility    Modified Rankin (Stroke Patients Only)       Balance Overall balance assessment: Mild deficits observed, not formally tested                                           Pertinent Vitals/Pain Pain  Assessment: No/denies pain    Home Living Family/patient expects to be discharged to:: Private residence Living Arrangements: Children(daughter) Available Help at Discharge: Family;Available PRN/intermittently Type of Home: House Home Access: Level entry     Home Layout: Multi-level Home Equipment: Shower seat;Grab bars - tub/shower;Cane - single point      Prior Function Level of Independence: Independent               Hand Dominance        Extremity/Trunk Assessment   Upper Extremity Assessment Upper Extremity Assessment: Overall WFL for tasks assessed    Lower Extremity Assessment Lower Extremity Assessment: Overall WFL for tasks assessed(flat feet)    Cervical / Trunk Assessment Cervical / Trunk Assessment: Kyphotic  Communication   Communication: No difficulties  Cognition Arousal/Alertness: Awake/alert Behavior During Therapy: WFL for tasks assessed/performed Overall Cognitive Status: Within Functional Limits for tasks assessed                                        General Comments General comments (skin integrity, edema, etc.): daughter in room and on phone making plans for FMLA    Exercises     Assessment/Plan    PT Assessment Patient needs continued PT services  PT Problem List Decreased mobility;Decreased balance;Decreased knowledge of use of DME;Decreased activity tolerance  PT Treatment Interventions DME instruction;Therapeutic activities;Gait training;Stair training;Functional mobility training;Patient/family education    PT Goals (Current goals can be found in the Care Plan section)  Acute Rehab PT Goals Patient Stated Goal: to return home tomorrow PT Goal Formulation: With patient/family Time For Goal Achievement: 03/18/18 Potential to Achieve Goals: Good    Frequency Min 3X/week   Barriers to discharge        Co-evaluation               AM-PAC PT "6 Clicks" Daily Activity  Outcome Measure  Difficulty turning over in bed (including adjusting bedclothes, sheets and blankets)?: A Little Difficulty moving from lying on back to sitting on the side of the bed? : A Little Difficulty sitting down on and standing up from a chair with arms (e.g., wheelchair, bedside commode, etc,.)?: A Lot Help needed moving to and from a bed to chair (including a wheelchair)?: A Little Help needed walking in hospital room?: A Little Help needed climbing 3-5 steps with a railing? : A Little 6 Click Score: 17    End of Session Equipment Utilized During Treatment: Gait belt Activity Tolerance: Patient tolerated treatment well Patient left: with call bell/phone within reach;in bed;with family/visitor present(sitting EOB)   PT Visit Diagnosis: Muscle weakness (generalized) (M62.81);Other abnormalities of gait and mobility (R26.89)    Time: 1610-9604 PT Time Calculation (min) (ACUTE ONLY): 26 min   Charges:   PT Evaluation $PT Eval Low Complexity: 1 Low PT Treatments $Gait Training: 8-22 mins        Lynn Brown, PT Acute Rehabilitation Services [phone] 03/11/2018   Lynn Brown 03/11/2018, 4:12 PM

## 2018-03-11 NOTE — Progress Notes (Signed)
Progress Note  Patient Name: Lynn Brown Date of Encounter: 03/11/2018  Primary Cardiologist: Lynn Noe, MD   Subjective   No CP or dyspnea  Inpatient Medications    Scheduled Meds: . aspirin EC  81 mg Oral Daily  . clopidogrel  75 mg Oral Q breakfast  . levothyroxine  125 mcg Oral QAC breakfast  . magnesium oxide  400 mg Oral Daily  . metoprolol tartrate  12.5 mg Oral BID  . rosuvastatin  20 mg Oral Daily  . sodium chloride flush  3 mL Intravenous Q12H   Continuous Infusions: . sodium chloride    . heparin 800 Units/hr (03/11/18 0300)   PRN Meds: sodium chloride, acetaminophen, magnesium hydroxide, nitroGLYCERIN, ondansetron (ZOFRAN) IV, oxyCODONE, sodium chloride flush   Vital Signs    Vitals:   03/10/18 1925 03/10/18 2300 03/10/18 2350 03/11/18 0300  BP: 104/73 (!) 102/55 98/70 112/70  Pulse: 64 63 80 (!) 59  Resp: (!) 21 18 18  (!) 21  Temp: 97.6 F (36.4 C)  98.2 F (36.8 C)   TempSrc: Oral  Oral   SpO2: 95% 97% 95% 93%  Weight:    82.3 kg  Height:        Intake/Output Summary (Last 24 hours) at 03/11/2018 0755 Last data filed at 03/11/2018 0641 Gross per 24 hour  Intake 2495.48 ml  Output 1850 ml  Net 645.48 ml   Filed Weights   03/09/18 1103 03/10/18 0500 03/11/18 0300  Weight: 81.6 kg 83.6 kg 82.3 kg    Telemetry    Sinus with pacs- Personally Reviewed  Physical Exam   GEN: No acute distress.   Neck: No JVD Cardiac: RRR, no murmurs, rubs, or gallops.  Respiratory: Clear to auscultation bilaterally. GI: Soft, nontender, non-distended  MS: No edema; radial cath site with no hematoma Neuro:  Nonfocal  Psych: Normal affect   Labs    Chemistry Recent Labs  Lab 03/09/18 1136 03/10/18 0647 03/11/18 0242  NA 141 140 141  K 3.9 4.2 4.4  CL 102 106 105  CO2 28 23 23   GLUCOSE 117* 108* 103*  BUN 27* 27* 26*  CREATININE 0.98 1.01* 0.96  CALCIUM 9.5 8.7* 9.0  PROT  --  6.0*  --   ALBUMIN  --  3.2*  --   AST  --  28  --    ALT  --  26  --   ALKPHOS  --  45  --   BILITOT  --  1.0  --   GFRNONAA 52* 50* 53*  GFRAA 60* 58* >60  ANIONGAP 11 11 13      Hematology Recent Labs  Lab 03/09/18 1136 03/10/18 0647 03/11/18 0242  WBC 4.2 5.0 4.9  RBC 4.78 4.32 4.14  HGB 14.6 13.0 12.8  HCT 45.8 41.3 39.5  MCV 95.8 95.6 95.4  MCH 30.5 30.1 30.9  MCHC 31.9 31.5 32.4  RDW 13.5 13.5 13.7  PLT 152 145* 141*    Cardiac Enzymes Recent Labs  Lab 03/10/18 0040 03/10/18 0647 03/10/18 1214  TROPONINI 0.39* 0.27* 0.19*    Recent Labs  Lab 03/09/18 1144 03/09/18 1429  TROPIPOC 0.13* 0.49*     BNP Recent Labs  Lab 03/09/18 1138  BNP 74.6     Radiology    Dg Chest 2 View  Result Date: 03/09/2018 CLINICAL DATA:  84 year old with left arm pain.  Chest pain. EXAM: CHEST - 2 VIEW COMPARISON:  06/01/2017 FINDINGS: Heart and mediastinum are stable  and within normal limits. Atherosclerotic calcifications at the aortic arch. Trachea is midline. Lungs are clear without focal airspace disease or pulmonary edema. No large pleural effusions. Few densities in the posterior lower chest on the lateral view appear to represent overlying shadows. No acute bone abnormality. IMPRESSION: No active cardiopulmonary disease. Electronically Signed   By: Lynn Brown M.D.   On: 03/09/2018 11:54    Patient Profile     Lynn Brown a 84 y.o.femalewith hypertension, prior history of nephrectomy as a kidney donor, hyperlipidemia, chronic lower extremity edema, hyperlipidemia, hypothyroidism admitted for CP and ruled in for NSTEMI.   Assessment & Plan    1 non-ST elevation myocardial infarction-cardiac catheterization results noted.  Patient with coronary ectasia and possible thrombus in the mid circumflex and possible filling abnormality in the LAD as well.  LV function is normal.  I have discussed the patient with Dr. Katrinka Brown.  Plan is to discontinue heparin today.  Begin apixaban.  Discontinue aspirin and continue Plavix.   Plan combination Plavix and apixaban for 30 days and then discontinue Plavix with continuation of apixaban long-term.  Continue beta-blocker and statin.  2 supraventricular tachycardia-no further arrhythmias on telemetry.  Continue beta-blocker.  3 history of nephrectomy-plan repeat creatinine tomorrow.  4 hyperlipidemia-continue statin.  Plan ambulate today.  Discharge tomorrow morning if stable.  For questions or updates, please contact [facility] Please consult [email or web address] for contact info under        Signed, Lynn Millers, MD  03/11/2018, 7:55 AM

## 2018-03-11 NOTE — Progress Notes (Addendum)
PROGRESS NOTE    Lynn Brown  ZOX:[id number] DOB: February 03, 1932 DOA: 03/09/2018 PCP: Laurann Montana, MD    Brief Narrative: : Lynn Brown is a 84 y.o. female with medical history significant of hypertension, hypothyroidism, anxiety, hyperlipidemia multiple other medical problems presenting with chest pain.  She notes her symptoms started sometime in the past week.  She describes her symptoms has left arm pain which has been on and off for the past week as well as a left breast pain, left subscap pain and substernal pressure.  She notes this is coming gone, but this morning it is worse.  She notes persistent pain at this time after the morphine with persistent pressure.  She states that she came to the emergency department because of the pain is also her blood pressure was up and down.  She denies any fevers, chills, cough, cold, sweating, numbness, nausea, vomiting, abdominal pain.  She does note some palpitations.  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 Plan is for dual antiplatelet therapy with eliquis and Plavix. She will need CTA to follow aneurysmal.    Assessment & Plan:   Principal Problem:   NSTEMI (non-ST elevated myocardial infarction) [facility]) Active Problems:   Essential hypertension   Hypercholesteremia   Chronic diastolic heart failure (HCC)  1-STEMI;  Presents with chest pain, mildly elevated troponin,  Continue with heparin gtt, aspirin , statins.  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 Plan to stop heparin today. Start eliquis and plavix.   2-Hypotension;  Hold HCTZ, cozaar.  Continue to hold BP medications.   HTN; hold cozaar and HCTZ. SBP low.    Hypothyroidism; Continue with synthroid.   HLD;  Continue with Crestor.   Constipation; start stool softener.   SVT;  Continue with metoprolol.   DVT prophylaxis: Heparin  Code Status: full code.    Family Communication: family at bedside.  Disposition Plan: remain inpatient for treatment of NSTEMI, cath. IV heparin.   Consultants:   Cardiology   Procedures:  Cath 10-14   Antimicrobials:     Subjective: She is feeling well. Denies chest pain.  No BM since Saturday.   Objective: Vitals:   03/10/18 1925 03/10/18 2300 03/10/18 2350 03/11/18 0300  BP: 104/73 (!) 102/55 98/70 112/70  Pulse: 64 63 80 (!) 59  Resp: (!) 21 18 18  (!) 21  Temp: 97.6 F (36.4 C)  98.2 F (36.8 C)   TempSrc: Oral  Oral   SpO2: 95% 97% 95% 93%  Weight:    82.3 kg  Height:        Intake/Output Summary (Last 24 hours) at 03/11/2018 0808 Last data filed at 03/11/2018 0641 Gross per 24 hour  Intake 1875.23 ml  Output 1850 ml  Net 25.23 ml   Filed Weights   03/09/18 1103 03/10/18 0500 03/11/18 0300  Weight: 81.6 kg 83.6 kg 82.3 kg    Examination:  General exam: NAD Respiratory system: CTA Cardiovascular system:  S 1, S 2  Gastrointestinal system: BS present, soft, nt Central nervous system: Non focal.  Extremities: Symmetric power.  Skin: No rashes.     Data Reviewed: I have personally reviewed following labs and imaging studies  CBC: Recent Labs  Lab 03/09/18 1136 03/10/18 0647 03/11/18 0242  WBC 4.2 5.0 4.9  HGB 14.6 13.0 12.8  HCT 45.8 41.3 39.5  MCV 95.8 95.6 95.4  PLT 152 145* 141*  Basic Metabolic Panel: Recent Labs  Lab 03/09/18 1136 03/10/18 0647 03/11/18 0242  NA 141 140 141  K 3.9 4.2 4.4  CL 102 106 105  CO2 28 23 23   GLUCOSE 117* 108* 103*  BUN 27* 27* 26*  CREATININE 0.98 1.01* 0.96  CALCIUM 9.5 8.7* 9.0   GFR: Estimated Creatinine Clearance: 43.4 mL/min (by C-G formula based on SCr of 0.96 mg/dL). Liver Function Tests: Recent Labs  Lab 03/10/18 0647  AST 28  ALT 26  ALKPHOS 45  BILITOT 1.0  PROT 6.0*  ALBUMIN 3.2*   No results for input(s): LIPASE, AMYLASE in the last 168 hours. No results for input(s): AMMONIA in the last 168  hours. Coagulation Profile: No results for input(s): INR, PROTIME in the last 168 hours. Cardiac Enzymes: Recent Labs  Lab 03/10/18 0040 03/10/18 0647 03/10/18 1214  TROPONINI 0.39* 0.27* 0.19*   BNP (last 3 results) Recent Labs    09/25/17 0943  PROBNP 43   HbA1C: No results for input(s): HGBA1C in the last 72 hours. CBG: No results for input(s): GLUCAP in the last 168 hours. Lipid Profile: No results for input(s): CHOL, HDL, LDLCALC, TRIG, CHOLHDL, LDLDIRECT in the last 72 hours. Thyroid Function Tests: No results for input(s): TSH, T4TOTAL, FREET4, T3FREE, THYROIDAB in the last 72 hours. Anemia Panel: No results for input(s): VITAMINB12, FOLATE, FERRITIN, TIBC, IRON, RETICCTPCT in the last 72 hours. Sepsis Labs: No results for input(s): PROCALCITON, LATICACIDVEN in the last 168 hours.  Recent Results (from the past 240 hour(s))  MRSA PCR Screening     Status: None   Collection Time: 03/09/18  8:00 PM  Result Value Ref Range Status   MRSA by PCR NEGATIVE NEGATIVE Final    Comment:        The GeneXpert MRSA Assay (FDA approved for NASAL specimens only), is one component of a comprehensive MRSA colonization surveillance program. It is not intended to diagnose MRSA infection nor to guide or monitor treatment for MRSA infections. Performed at [facility], [address] [place], [place] [address]          Radiology Studies: Dg Chest 2 View  Result Date: 03/09/2018 CLINICAL DATA:  84 year old with left arm pain.  Chest pain. EXAM: CHEST - 2 VIEW COMPARISON:  06/01/2017 FINDINGS: Heart and mediastinum are stable and within normal limits. Atherosclerotic calcifications at the aortic arch. Trachea is midline. Lungs are clear without focal airspace disease or pulmonary edema. No large pleural effusions. Few densities in the posterior lower chest on the lateral view appear to represent overlying shadows. No acute bone abnormality. IMPRESSION: No active  cardiopulmonary disease. Electronically Signed   By: Richarda Overlie M.D.   On: 03/09/2018 11:54        Scheduled Meds: . apixaban  5 mg Oral BID  . clopidogrel  75 mg Oral Q breakfast  . levothyroxine  125 mcg Oral QAC breakfast  . magnesium oxide  400 mg Oral Daily  . metoprolol tartrate  12.5 mg Oral BID  . rosuvastatin  20 mg Oral Daily  . sodium chloride flush  3 mL Intravenous Q12H   Continuous Infusions: . sodium chloride       LOS: 2 days    Time spent: 35 minutes.     Alba Cory, MD [facility] Pager [phone]  If 7PM-7AM, please contact night-coverage [email or web address] Password [email or web address] 03/11/2018, 8:08 AM

## 2018-03-11 NOTE — Care Management Note (Signed)
Case Management Note  Patient Details  Name: Lynn Brown MRN: [id number] Date of Birth: 03-26-1932  Subjective/Objective:   Admitted with CP/ NSTEMI. Hx of hypertension, hypothyroidism, anxiety, hyperlipidemia, s/p nephrectomy as a kidney donor. Resides with daughter Lynn Brown. PTA independent ADL's , no DME usage.               S/p L Heart cath, 10/14   Jacqualyn Posey (Daughter)      [phone]      PCP: Vernard Gambles  Action/Plan:   NCM will provide pt with Eliquis 30 day free card prior to d/c. Benefit check in process for Eliquis 5mg  twice a day. NCM will continue to monitor for TOC needs.  Pt has transportation to home.  Expected Discharge Date:  03/12/18               Expected Discharge Plan:  Home/Self Care  In-House Referral:     Discharge planning Services  CM Consult  Post Acute Care Choice:    Choice offered to:     DME Arranged:    DME Agency:     HH Arranged:    HH Agency:     Status of Service:  In process, will continue to follow  If discussed at Long Length of Stay Meetings, dates discussed:    Additional Comments:  Epifanio Lesches, RN 03/11/2018, 3:53 PM

## 2018-03-11 NOTE — Care Management (Signed)
#   4.  S/W JENNA  @ [employer] # [phone]  ELIQUIS  5 MG BID COVER- YES CO-PAY- $ 45.00 TIER- 3 DRUG PRIOR APPROVAL- NO  PREFERRED PHARMACY : YES [facility], [facility], AND [facility]

## 2018-03-11 NOTE — Discharge Instructions (Signed)
Information on my medicine - ELIQUIS (apixaban)  This medication education was reviewed with me or my healthcare representative as part of my discharge preparation.  The pharmacist that spoke with me during my hospital stay was:  Daundre Biel T Wave Calzada, RPH  Why was Eliquis prescribed for you? Eliquis was prescribed for you to reduce the risk of forming blood clots that can cause a stroke if you have a medical condition called atrial fibrillation (a type of irregular heartbeat) OR to reduce the risk of a blood clots forming after orthopedic surgery.  What do You need to know about Eliquis ? Take your Eliquis TWICE DAILY - one tablet in the morning and one tablet in the evening with or without food.  It would be best to take the doses about the same time each day.  If you have difficulty swallowing the tablet whole please discuss with your pharmacist how to take the medication safely.  Take Eliquis exactly as prescribed by your doctor and DO NOT stop taking Eliquis without talking to the doctor who prescribed the medication.  Stopping may increase your risk of developing a new clot or stroke.  Refill your prescription before you run out.  After discharge, you should have regular check-up appointments with your healthcare provider that is prescribing your Eliquis.  In the future your dose may need to be changed if your kidney function or weight changes by a significant amount or as you get older.  What do you do if you miss a dose? If you miss a dose, take it as soon as you remember on the same day and resume taking twice daily.  Do not take more than one dose of ELIQUIS at the same time.  Important Safety Information A possible side effect of Eliquis is bleeding. You should call your healthcare provider right away if you experience any of the following: ? Bleeding from an injury or your nose that does not stop. ? Unusual colored urine (red or dark brown) or unusual colored stools (red or  black). ? Unusual bruising for unknown reasons. ? A serious fall or if you hit your head (even if there is no bleeding).  Some medicines may interact with Eliquis and might increase your risk of bleeding or clotting while on Eliquis. To help avoid this, consult your healthcare provider or pharmacist prior to using any new prescription or non-prescription medications, including herbals, vitamins, non-steroidal anti-inflammatory drugs (NSAIDs) and supplements.  This website has more information on Eliquis (apixaban): www.Eliquis.com.    

## 2018-03-12 LAB — BASIC METABOLIC PANEL
Anion gap: 6 (ref 5–15)
BUN: 29 mg/dL — ABNORMAL HIGH (ref 8–23)
CO2: 28 mmol/L (ref 22–32)
Calcium: 9.3 mg/dL (ref 8.9–10.3)
Chloride: 106 mmol/L (ref 98–111)
Creatinine, Ser: 1.13 mg/dL — ABNORMAL HIGH (ref 0.44–1.00)
GFR calc Af Amer: 50 mL/min — ABNORMAL LOW (ref >60)
GFR calc non Af Amer: 43 mL/min — ABNORMAL LOW (ref >60)
Glucose, Bld: 113 mg/dL — ABNORMAL HIGH (ref 70–99)
Potassium: 4.7 mmol/L (ref 3.5–5.1)
Sodium: 140 mmol/L (ref 135–145)

## 2018-03-12 LAB — CBC
HCT: 40.4 % (ref 36.0–46.0)
Hemoglobin: 12.9 g/dL (ref 12.0–15.0)
MCH: 30.4 pg (ref 26.0–34.0)
MCHC: 31.9 g/dL (ref 30.0–36.0)
MCV: 95.1 fL (ref 80.0–100.0)
Platelets: 144 10*3/uL — ABNORMAL LOW (ref 150–400)
RBC: 4.25 MIL/uL (ref 3.87–5.11)
RDW: 13.6 % (ref 11.5–15.5)
WBC: 5.4 10*3/uL (ref 4.0–10.5)
nRBC: 0 % (ref 0.0–0.2)

## 2018-03-12 MED ORDER — CLOPIDOGREL BISULFATE 75 MG PO TABS: 75.0000 mg | ORAL_TABLET | Freq: Every day | ORAL | 0 refills | Status: DC

## 2018-03-12 MED ORDER — APIXABAN 5 MG PO TABS: 5.0000 mg | ORAL_TABLET | Freq: Two times a day (BID) | ORAL | 0 refills | Status: DC

## 2018-03-12 MED ORDER — METOPROLOL TARTRATE 25 MG PO TABS: 12.5000 mg | ORAL_TABLET | Freq: Two times a day (BID) | ORAL | 0 refills | Status: DC

## 2018-03-12 MED ORDER — HYDROCHLOROTHIAZIDE 12.5 MG PO CAPS: 12.5000 mg | ORAL_CAPSULE | Freq: Every day | ORAL | 0 refills | Status: DC

## 2018-03-12 MED ORDER — HYDROCHLOROTHIAZIDE 12.5 MG PO CAPS
12.5000 mg | ORAL_CAPSULE | Freq: Every day | ORAL | Status: DC
  Administered 2018-03-12: 12.5 mg via ORAL
  Filled 2018-03-12: qty 1

## 2018-03-12 MED ORDER — POLYETHYLENE GLYCOL 3350 17 G PO PACK
17.0000 g | PACK | Freq: Once | ORAL | Status: AC
  Administered 2018-03-12: 17 g via ORAL
  Filled 2018-03-12: qty 1

## 2018-03-12 MED ORDER — NITROGLYCERIN 0.4 MG SL SUBL: 0.4000 mg | SUBLINGUAL_TABLET | SUBLINGUAL | 0 refills | Status: DC | PRN

## 2018-03-12 MED FILL — HYDROCHLOROTHIAZIDE 12.5 MG: 12.5 | 30 days supply | Qty: 30 | Fill #0

## 2018-03-12 MED FILL — METOPROLOL TARTRATE 25 MG T: 25 | 60 days supply | Qty: 60 | Fill #0

## 2018-03-12 MED FILL — NITROGLYCERIN 0.4 MG TAB SL: 0.4 | 8 days supply | Qty: 25 | Fill #0

## 2018-03-12 MED FILL — ELIQUIS 5 MG TABLET: 5 | 30 days supply | Qty: 60 | Fill #0

## 2018-03-12 MED FILL — CLOPIDOGREL 75 MG TABLET: 75 | 30 days supply | Qty: 30 | Fill #0

## 2018-03-12 NOTE — Discharge Summary (Addendum)
Discharge Summary  Lynn ARVANITIS Brown:[id number] DOB: 03-May-1932  PCP: Laurann Montana, MD  Admit date: 03/09/2018 Discharge date: 03/12/2018  Time spent: , more than 50% time spent on coordination of care. Case discussed with cardiology Dr Jens Som  Recommendations for Outpatient Follow-up:  1. F/u with PMD within a week  for hospital discharge follow up, repeat cbc/bmp at follow up 2. F/u with cardiology Dr Katrinka Blazing 3. Cardiac rehab referral  Discharge Diagnoses:  Active Hospital Problems   Diagnosis Date Noted  . NSTEMI (non-ST elevated myocardial infarction) (HCC) 03/09/2018  . Chronic diastolic heart failure (HCC) 09/25/2017  . Hypercholesteremia   . Essential hypertension     Resolved Hospital Problems  No resolved problems to display.    Discharge Condition: stable  Diet recommendation: heart healthy  Filed Weights   03/09/18 1103 03/10/18 0500 03/11/18 0300  Weight: 81.6 kg 83.6 kg 82.3 kg    History of present illness: (per admitting MD Dr Lowell Guitar) PCP: Laurann Montana, MD  Patient coming from: home  I have personally briefly reviewed patient's old medical records in [facility]  Chief Complaint: cp  HPI: Lynn Brown is a 84 y.o. female with medical history significant of hypertension, hypothyroidism, anxiety, hyperlipidemia multiple other medical problems presenting with chest pain.  She notes her symptoms started sometime in the past week.  She describes her symptoms has left arm pain which has been on and off for the past week as well as a left breast pain, left subscap pain and substernal pressure.  She notes this is coming gone, but this morning it is worse.  She notes persistent pain at this time after the morphine with persistent pressure.  She states that she came to the emergency department because of the pain is also her blood pressure was up and down.  She denies any fevers, chills, cough, cold, sweating, numbness, nausea, vomiting,  abdominal pain.  She does note some palpitations.  ED Course: Labs, EKG, morphine.  Discussed with cards.  Hospitalists to admit.  Hospital Course:  Principal Problem:   NSTEMI (non-ST elevated myocardial infarction) [facility]) Active Problems:   Essential hypertension   Hypercholesteremia   Chronic diastolic heart failure (HCC)  NSTEMI Per cardiology "cardiac catheterization revealed coronary ectasia and possible thrombus in the mid circumflex and possible filling abnormality in the LAD as well.  LV function is normal. Plan is plavix and apixaban for 30 days; then DC plavix and continue apixaban.  Continue beta-blocker and statin." D/c asa,  Discharge on plavix and eliquis Cardiac rehab Cardiology follow up  SVT She is started on lopressor  HTN:  Home meds losartan discontinued, home meds HCTZ dose reduced. She is started on lopressor  H/o Nephrectomy 1978 She donated one kidney to her sister  Hypothroidism Continue synthroid  Constipation Stool softener  Procedures:  Cardiac cath on 10/14  Consultations:  cardiology  Discharge Exam: BP 129/64 (BP Location: Right Arm)   Pulse 60   Temp 97.9 F (36.6 C) (Oral)   Resp (!) 22   Ht 5\' 2"  (1.575 m)   Wt 82.3 kg   SpO2 98%   BMI 33.19 kg/m   General: NAD Cardiovascular: RRR Respiratory: CTABL  Discharge Instructions You were cared for by a hospitalist during your hospital stay. If you have any questions about your discharge medications or the care you received while you were in the hospital after you are discharged, you can call the unit and asked to speak with the hospitalist on  call if the hospitalist that took care of you is not available. Once you are discharged, your primary care physician will handle any further medical issues. Please note that NO REFILLS for any discharge medications will be authorized once you are discharged, as it is imperative that you return to your primary care physician (or establish a  relationship with a primary care physician if you do not have one) for your aftercare needs so that they can reassess your need for medications and monitor your lab values.  Discharge Instructions    Diet - low sodium heart healthy   Complete by:  As directed    Increase activity slowly   Complete by:  As directed      Allergies as of 03/12/2018   No Known Allergies     Medication List    STOP taking these medications   aspirin EC 81 MG tablet   hydrochlorothiazide 25 MG tablet Commonly known as:  HYDRODIURIL Replaced by:  hydrochlorothiazide 12.5 MG capsule   losartan 25 MG tablet Commonly known as:  COZAAR     TAKE these medications   apixaban 5 MG Tabs tablet Commonly known as:  ELIQUIS Take 1 tablet (5 mg total) by mouth 2 (two) times daily.   calcium citrate-vitamin D 315-200 MG-UNIT tablet Commonly known as:  CITRACAL+D Take 1 tablet by mouth daily.   clopidogrel 75 MG tablet Commonly known as:  PLAVIX Take 1 tablet (75 mg total) by mouth daily with breakfast. Start taking on:  03/13/2018   CO Q 10 PO Take 1 tablet by mouth daily.   FISH OIL PO Take 1 tablet by mouth daily.   hydrochlorothiazide 12.5 MG capsule Commonly known as:  MICROZIDE Take 1 capsule (12.5 mg total) by mouth daily. Replaces:  hydrochlorothiazide 25 MG tablet   levothyroxine 125 MCG tablet Commonly known as:  SYNTHROID, LEVOTHROID Take 125 mcg by mouth daily before breakfast.   magnesium oxide 400 MG tablet Commonly known as:  MAG-OX Take 400 mg by mouth daily.   metoprolol tartrate 25 MG tablet Commonly known as:  LOPRESSOR Take 0.5 tablets (12.5 mg total) by mouth 2 (two) times daily.   nitroGLYCERIN 0.4 MG SL tablet Commonly known as:  NITROSTAT Place 1 tablet (0.4 mg total) under the tongue every 5 (five) minutes as needed for chest pain.   rosuvastatin 20 MG tablet Commonly known as:  CRESTOR Take 20 mg by mouth daily.   Vitamin D 2000 units tablet Take 2,000  Units by mouth daily.      No Known Allergies Follow-up Information    Laurann Montana, MD Follow up in 1 week(s).   Specialty:  Family Medicine Why:  hospital discharge follow up.  Contact information: [address], [address] [place] [address] [phone]        Lyn Records, MD Follow up in 6 week(s).   Specialty:  Cardiology Contact information: [address] [place] [address] [phone]        [facility] Follow up in 1 week(s).   Specialty:  Cardiology Why:  repeat bmp at follow up. Contact information: [address], [address] [place] [address] [phone]           The results of significant diagnostics from this hospitalization (including imaging, microbiology, ancillary and laboratory) are listed below for reference.    Significant Diagnostic Studies: Dg Chest 2 View  Result Date: 03/09/2018 CLINICAL DATA:  84 year old  with left arm pain.  Chest pain. EXAM: CHEST - 2 VIEW COMPARISON:  06/01/2017 FINDINGS: Heart and mediastinum are stable and within normal limits. Atherosclerotic calcifications at the aortic arch. Trachea is midline. Lungs are clear without focal airspace disease or pulmonary edema. No large pleural effusions. Few densities in the posterior lower chest on the lateral view appear to represent overlying shadows. No acute bone abnormality. IMPRESSION: No active cardiopulmonary disease. Electronically Signed   By: Richarda Overlie M.D.   On: 03/09/2018 11:54   Dg Foot Complete Left  Result Date: 02/17/2018 CLINICAL DATA:  Left great toe swelling. Injury while getting out of bed earlier this month. Initial encounter. EXAM: LEFT FOOT - COMPLETE 3+ VIEW COMPARISON:  None. FINDINGS: There is an oblique, minimally displaced fracture of the distal phalanx of the great toe which involves the articular surface. There is no dislocation. Overlying soft tissue swelling is noted. There is  pes planus. Degenerative changes are noted in the midfoot. Scattered small calcifications are noted in the soft tissues of the lower leg. IMPRESSION: Minimally displaced intra-articular fracture of the distal phalanx of the great toe. Electronically Signed   By: Sebastian Ache M.D.   On: 02/17/2018 12:58    Microbiology: Recent Results (from the past 240 hour(s))  MRSA PCR Screening     Status: None   Collection Time: 03/09/18  8:00 PM  Result Value Ref Range Status   MRSA by PCR NEGATIVE NEGATIVE Final    Comment:        The GeneXpert MRSA Assay (FDA approved for NASAL specimens only), is one component of a comprehensive MRSA colonization surveillance program. It is not intended to diagnose MRSA infection nor to guide or monitor treatment for MRSA infections. Performed at [facility], [address] [place], [place] [address]      Labs: Basic Metabolic Panel: Recent Labs  Lab 03/09/18 1136 03/10/18 0647 03/11/18 0242 03/12/18 0234  NA 141 140 141 140  K 3.9 4.2 4.4 4.7  CL 102 106 105 106  CO2 28 23 23 28   GLUCOSE 117* 108* 103* 113*  BUN 27* 27* 26* 29*  CREATININE 0.98 1.01* 0.96 1.13*  CALCIUM 9.5 8.7* 9.0 9.3   Liver Function Tests: Recent Labs  Lab 03/10/18 0647  AST 28  ALT 26  ALKPHOS 45  BILITOT 1.0  PROT 6.0*  ALBUMIN 3.2*   No results for input(s): LIPASE, AMYLASE in the last 168 hours. No results for input(s): AMMONIA in the last 168 hours. CBC: Recent Labs  Lab 03/09/18 1136 03/10/18 0647 03/11/18 0242 03/12/18 0234  WBC 4.2 5.0 4.9 5.4  HGB 14.6 13.0 12.8 12.9  HCT 45.8 41.3 39.5 40.4  MCV 95.8 95.6 95.4 95.1  PLT 152 145* 141* 144*   Cardiac Enzymes: Recent Labs  Lab 03/10/18 0040 03/10/18 0647 03/10/18 1214  TROPONINI 0.39* 0.27* 0.19*   BNP: BNP (last 3 results) Recent Labs    06/01/17 2022 03/09/18 1138  BNP 29.6 74.6    ProBNP (last 3 results) Recent Labs    09/25/17 0943  PROBNP 43    CBG: No results  for input(s): GLUCAP in the last 168 hours.     Signed:  Albertine Grates MD, PhD  [facility] 03/12/2018, 9:35 AM

## 2018-03-12 NOTE — Care Management Note (Addendum)
Case Management Note Previous Note Created by Gae Gallop  Patient Details  Name: Lynn Brown MRN: [id number] Date of Birth: 1932-05-18  Subjective/Objective:   Admitted with CP/ NSTEMI. Hx of hypertension, hypothyroidism, anxiety, hyperlipidemia, s/p nephrectomy as a kidney donor. Resides with daughter [name]. PTA independent ADL's , no DME usage.               S/p L Heart cath, 10/14   Jacqualyn Posey (Daughter)      [phone]      PCP: Vernard Gambles  Action/Plan:   NCM will provide pt with Eliquis 30 day free card prior to d/c. Benefit check in process for Eliquis 5mg  twice a day. NCM will continue to monitor for TOC needs.  Pt has transportation to home.  Expected Discharge Date:  03/12/18               Expected Discharge Plan:  Home/Self Care  In-House Referral:     Discharge planning Services  CM Consult  Post Acute Care Choice:    Choice offered to:  Patient, Adult Children  DME Arranged:  3-N-1, Walker rolling DME Agency:  [facility]  HH Arranged:    HH Agency:     Status of Service:  Completed, signed off  If discussed at [employer] of Stay Meetings, dates discussed:    Additional Comments: 03/12/2018  CM offered DME agency choice - [facility] chosen - referral accept by [facility].  Cardiac Rehab/attending to order outpt cardiac rehab (not PT)  Pt to discharge home today via private vehicle.  Pt will receive first 30 day supply of Eliquis today by [facility] pharmacy prior to discharge.  Pt has PCP and family denied barriers with taking pt back home.  No more CM needs identified at this time Cherylann Parr, RN 03/12/2018, 10:30 AM

## 2018-03-12 NOTE — Progress Notes (Signed)
Pt ambulated well with PT this am. Discussed with pt and daughter MI,  restrictions, walking gl, NTG, diet, and CRPII. Will send referral to [place] CRPII.  [id number] Ethelda Chick CES, ACSM 10:24 AM 03/12/2018

## 2018-03-12 NOTE — Progress Notes (Signed)
Progress Note  Patient Name: Lynn Brown Date of Encounter: 03/12/2018  Primary Cardiologist: Lesleigh Noe, MD   Subjective   Denies CP or dyspea  Inpatient Medications    Scheduled Meds: . apixaban  5 mg Oral BID  . clopidogrel  75 mg Oral Q breakfast  . levothyroxine  125 mcg Oral QAC breakfast  . magnesium oxide  400 mg Oral Daily  . metoprolol tartrate  12.5 mg Oral BID  . polyethylene glycol  17 g Oral Once  . rosuvastatin  20 mg Oral Daily  . senna-docusate  1 tablet Oral Daily  . sodium chloride flush  3 mL Intravenous Q12H   Continuous Infusions: . sodium chloride     PRN Meds: sodium chloride, acetaminophen, magnesium hydroxide, nitroGLYCERIN, ondansetron (ZOFRAN) IV, oxyCODONE, sodium chloride flush   Vital Signs    Vitals:   03/11/18 0820 03/11/18 1929 03/11/18 2300 03/12/18 0330  BP: (!) 145/95 (!) 147/82 129/86 129/64  Pulse: 61 80 69 60  Resp: (!) 21 18 (!) 24 (!) 22  Temp: 97.6 F (36.4 C) 97.8 F (36.6 C) 98 F (36.7 C) 97.9 F (36.6 C)  TempSrc: Oral Oral Oral Oral  SpO2: 99% 96% 96% 98%  Weight:      Height:        Intake/Output Summary (Last 24 hours) at 03/12/2018 0903 Last data filed at 03/11/2018 2130 Gross per 24 hour  Intake 243 ml  Output -  Net 243 ml   Filed Weights   03/09/18 1103 03/10/18 0500 03/11/18 0300  Weight: 81.6 kg 83.6 kg 82.3 kg    Telemetry    Sinus with pacs- Personally Reviewed  Physical Exam   GEN: WD WN No acute distress.   Neck: No JVD, supple Cardiac: RRR Respiratory: CTA GI: Soft, NT/ND MS: No edema Neuro:  Grossly intact   Labs    Chemistry Recent Labs  Lab 03/10/18 0647 03/11/18 0242 03/12/18 0234  NA 140 141 140  K 4.2 4.4 4.7  CL 106 105 106  CO2 23 23 28   GLUCOSE 108* 103* 113*  BUN 27* 26* 29*  CREATININE 1.01* 0.96 1.13*  CALCIUM 8.7* 9.0 9.3  PROT 6.0*  --   --   ALBUMIN 3.2*  --   --   AST 28  --   --   ALT 26  --   --   ALKPHOS 45  --   --   BILITOT 1.0   --   --   GFRNONAA 50* 53* 43*  GFRAA 58* >60 50*  ANIONGAP 11 13 6      Hematology Recent Labs  Lab 03/10/18 0647 03/11/18 0242 03/12/18 0234  WBC 5.0 4.9 5.4  RBC 4.32 4.14 4.25  HGB 13.0 12.8 12.9  HCT 41.3 39.5 40.4  MCV 95.6 95.4 95.1  MCH 30.1 30.9 30.4  MCHC 31.5 32.4 31.9  RDW 13.5 13.7 13.6  PLT 145* 141* 144*    Cardiac Enzymes Recent Labs  Lab 03/10/18 0040 03/10/18 0647 03/10/18 1214  TROPONINI 0.39* 0.27* 0.19*    Recent Labs  Lab 03/09/18 1144 03/09/18 1429  TROPIPOC 0.13* 0.49*     BNP Recent Labs  Lab 03/09/18 1138  BNP 74.6     Patient Profile     Lynn Brown a 84 y.o.femalewith hypertension, prior history of nephrectomy as a kidney donor, hyperlipidemia, chronic lower extremity edema, hyperlipidemia, hypothyroidism admitted for CP and ruled in for NSTEMI.   Assessment &  Plan    1 non-ST elevation myocardial infarction-As outlined in prior note, cardiac catheterization revealed coronary ectasia and possible thrombus in the mid circumflex and possible filling abnormality in the LAD as well.  LV function is normal. Plan is plavix and apixaban for 30 days; then DC plavix and continue apixaban.  Continue beta-blocker and statin.  2 supraventricular tachycardia-continue metoprolol; no further episodes.  3 history of nephrectomy-renal function stable today.   4 hyperlipidemia-continue statin.  5 HTN-continue metoprolol at present dose; add HCTZ 12.5 mg daily; follow BP as outpt and adjust regimen as needed.  [facility] will sign off.   Medication Recommendations:  As listed in [facility] Other recommendations (labs, testing, etc): BMET one week Follow up as an outpatient:  TOC appt one week with APP (bmet at that time) and fu with Dr Katrinka Blazing 6-8 weeks.  For questions or updates, please contact [facility] Please consult [email or web address] for contact info under        Signed, Olga Millers, MD  03/12/2018, 9:03 AM

## 2018-03-12 NOTE — Progress Notes (Signed)
Physical Therapy Treatment Patient Details Name: Lynn Brown MRN: [id number] DOB: 03-Jul-1931 Today's Date: 03/12/2018    History of Present Illness  Lynn Brown is a 84 y.o. female with medical history significant of hypertension, hypothyroidism, anxiety, hyperlipidemia multiple other medical problems presenting with chest pain, positive for NSTEMI.    PT Comments    Session focused on progression of mobility, activity tolerance, and stair training for preparation for discharge home. Patient ambulating 300 feet with no assistive device and supervision. Able to negotiate 10 steps with railing to simulate home environment. HR 85-95 bpm, BP 147/82 during mobility. Patient seems close to baseline with functional mobility but continues to display decreased endurance. Recommending Rollator and outpatient cardiac rehab at discharge.    Follow Up Recommendations  Outpatient PT((cardiac rehab))     Equipment Recommendations  Rollator (pt request)   Recommendations for Other Services       Precautions / Restrictions Precautions Precautions: Fall Restrictions Weight Bearing Restrictions: No    Mobility  Bed Mobility               General bed mobility comments: OOB in chair  Transfers Overall transfer level: Modified independent                  Ambulation/Gait Ambulation/Gait assistance: Supervision Gait Distance (Feet): 300 Feet Assistive device: None Gait Pattern/deviations: Step-through pattern;Decreased stride length;Wide base of support     General Gait Details: Decreased bilateral foot clearance during swing through phase secondary to decreased sensation but no overt LOB.    Stairs Stairs: Yes Stairs assistance: Min guard Stair Management: One rail Left Number of Stairs: 10 General stair comments: Cues for activity pacing, step by step pattern   Wheelchair Mobility    Modified Rankin (Stroke Patients Only)       Balance Overall balance  assessment: Mild deficits observed, not formally tested                                          Cognition Arousal/Alertness: Awake/alert Behavior During Therapy: WFL for tasks assessed/performed Overall Cognitive Status: Within Functional Limits for tasks assessed                                        Exercises      General Comments        Pertinent Vitals/Pain Pain Assessment: No/denies pain    Home Living                      Prior Function            PT Goals (current goals can now be found in the care plan section) Acute Rehab PT Goals Patient Stated Goal: none stated; agreeable to participate in therapy Potential to Achieve Goals: Good Progress towards PT goals: Progressing toward goals    Frequency    Min 3X/week      PT Plan Equipment recommendations need to be updated    Co-evaluation              AM-PAC PT "6 Clicks" Daily Activity  Outcome Measure  Difficulty turning over in bed (including adjusting bedclothes, sheets and blankets)?: A Little Difficulty moving from lying on back to sitting on the side of the bed? : A  Little Difficulty sitting down on and standing up from a chair with arms (e.g., wheelchair, bedside commode, etc,.)?: A Little Help needed moving to and from a bed to chair (including a wheelchair)?: A Little Help needed walking in hospital room?: A Little Help needed climbing 3-5 steps with a railing? : A Little 6 Click Score: 18    End of Session Equipment Utilized During Treatment: Gait belt Activity Tolerance: Patient tolerated treatment well Patient left: with call bell/phone within reach;with family/visitor present;in chair Nurse Communication: Mobility status PT Visit Diagnosis: Muscle weakness (generalized) (M62.81);Other abnormalities of gait and mobility (R26.89)     Time: 1610-9604 PT Time Calculation (min) (ACUTE ONLY): 22 min  Charges:  $Therapeutic Activity: 8-22  mins                    Laurina Bustle, PT, DPT Acute Rehabilitation Services Pager [phone] Office [phone]    Vanetta Mulders 03/12/2018, 9:53 AM

## 2018-03-13 ENCOUNTER — Telehealth (HOSPITAL_COMMUNITY): Payer: Self-pay

## 2018-03-13 NOTE — Telephone Encounter (Signed)
Pt insurance is active and benefits verified through [employer]. Co-pay $20.00, DED $0.00/$0.00 met, out of pocket $0.00/$0.00 met, co-insurance 0%. No pre-authorization. [employer], 03/13/18 @ 2:44PM, REF# [id number] °

## 2018-03-13 NOTE — Telephone Encounter (Signed)
Called patient to see if she is interested in the Cardiac Rehab Program. Patient expressed interest. Adv pt she would need to schedule and complete her follow up appt with Dr. Katrinka Blazing before she can join CR. Patient verbalized understanding and stated she will give them a call.   Placed in no f/u appt folder.

## 2018-03-19 NOTE — Progress Notes (Signed)
Cardiology Office Note:    Date:  03/20/2018   ID:  NICK ARMEL, DOB April 07, 1932, MRN [id number]  PCP:  Laurann Montana, MD  Cardiologist:  Lesleigh Noe, MD   Referring MD: Laurann Montana, MD   Chief Complaint  Patient presents with  . Chest Pain    ? Angina  . Congestive Heart Failure    History of Present Illness:    Lynn Brown is a 84 y.o. female with a hx of hypertension, prior history of nephrectomy as a kidney donor, hyperlipidemia, chronic lower extremity edema, hyperlipidemia, hypothyroidism, "leaky heart valve", LAD aneurysm/circumflex ectasia/RCA ectasia.  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  Vague complaints include exertional fatigue, "chest pain" (see below), and shortness of breath.  For years she has noted exertional fatigue and dyspnea.  This is not significantly changed.  When admitted to the hospital in October she was having shortness of breath and also complained of chest pain.  The chest pain that she describes is left axillary with radiation into the left arm.  He can last minutes and is a sharp shooting discomfort.  It is not precipitated by physical activity but rather occurs randomly.  Shortness of breath occurs intermittently.  It is occasionally associated with palpitations.  She was documented to have PSVT while in the hospital and one long episode was treated with IV beta-blockers (15 mg of metoprolol over 15 minutes) during her heart catheterization.  She feels episodes of SVT and dyspnea or less prominent.  No bleeding on combined antiplatelet/anti-coagulation therapy.   Past Medical History:  Diagnosis Date  . Allergic rhinitis   . Anxiety   . Bunion    PES PLANUS  . Cervical disc disease   . Hearing loss    HEARING AIDS, BUT DOES NOT WEAR THEM CONSISTENTLY.   Marland Kitchen Hypercholesteremia   . Hypertension   . Hypothyroid   . Leaky heart valve    DR. HARWANI  . Mild obesity   . Osteoarthritis of  knees, bilateral   . Osteoporosis 12/2017   T score -3.3  . Uterine polyp    H/O WITH BLEEDING---DR. FONTAINE  . Venous insufficiency     Past Surgical History:  Procedure Laterality Date  . CATARACT EXTRACTION, BILATERAL  2013   DR. SHAPIRO   . HYSTEROSCOPY     D & C  . LEFT HEART CATH AND CORONARY ANGIOGRAPHY N/A 03/10/2018   Procedure: LEFT HEART CATH AND CORONARY ANGIOGRAPHY;  Surgeon: Lyn Records, MD;  Location: [facility];  Service: Cardiovascular;  Laterality: N/A;  . NEPHRECTOMY LIVING DONOR  1978    Current Medications: Current Meds  Medication Sig  . apixaban (ELIQUIS) 5 MG TABS tablet Take 1 tablet (5 mg total) by mouth 2 (two) times daily.  . calcium citrate-vitamin D (CITRACAL+D) 315-200 MG-UNIT per tablet Take 1 tablet by mouth daily.   . Cholecalciferol (VITAMIN D) 2000 UNITS tablet Take 2,000 Units by mouth daily.    . clopidogrel (PLAVIX) 75 MG tablet Take 1 tablet (75 mg total) by mouth daily with breakfast.  . Coenzyme Q10 (CO Q 10 PO) Take 1 tablet by mouth daily.  . hydrochlorothiazide (MICROZIDE) 12.5 MG capsule Take 1 capsule (12.5 mg total) by mouth daily.  Marland Kitchen levothyroxine (SYNTHROID, LEVOTHROID) 125 MCG tablet Take 125 mcg by mouth daily before breakfast.   . magnesium oxide (MAG-OX) 400 MG tablet Take 400 mg by mouth daily.  . metoprolol tartrate (LOPRESSOR) 25  MG tablet Take 0.5 tablets (12.5 mg total) by mouth 2 (two) times daily.  . nitroGLYCERIN (NITROSTAT) 0.4 MG SL tablet Place 1 tablet (0.4 mg total) under the tongue every 5 (five) minutes as needed for chest pain.  . Omega-3 Fatty Acids (FISH OIL PO) Take 1 tablet by mouth daily.  . rosuvastatin (CRESTOR) 20 MG tablet Take 20 mg by mouth daily.     Allergies:   Patient has no known allergies.   Social History   Socioeconomic History  . Marital status: Widowed    Spouse name: Not on file  . Number of children: 4  . Years of education: Not on file  . Highest education level: Not  on file  Occupational History  . Occupation: RETIRED  Social Needs  . Financial resource strain: Not on file  . Food insecurity:    Worry: Not on file    Inability: Not on file  . Transportation needs:    Medical: Not on file    Non-medical: Not on file  Tobacco Use  . Smoking status: Never Smoker  . Smokeless tobacco: Never Used  Substance and Sexual Activity  . Alcohol use: No    Alcohol/week: 0.0 standard drinks  . Drug use: No  . Sexual activity: Never    Comment: 1st intercourse 84 yo-Fewer than 5 partners  Lifestyle  . Physical activity:    Days per week: Not on file    Minutes per session: Not on file  . Stress: Not on file  Relationships  . Social connections:    Talks on phone: Not on file    Gets together: Not on file    Attends religious service: Not on file    Active member of club or organization: Not on file    Attends meetings of clubs or organizations: Not on file    Relationship status: Not on file  Other Topics Concern  . Not on file  Social History Narrative  . Not on file     Family History: The patient's family history includes CVA in her brother, father, and mother; Cancer in her brother; Diabetes in her sister; Heart attack in her sister; Heart disease in her sister; Hypertension in her brother, brother, father, mother, and sister; Kidney disease in her sister; Kidney failure in her sister; Multiple sclerosis in her sister; Other in her brother; Pancreatic cancer in her sister; Seizures in her brother. There is no history of Breast cancer.  ROS:   Please see the history of present illness.    Constipation, joint swelling, as mentioned above irregular heartbeat, leg pain, fatigue, shortness of breath, orthopnea, and edema.  All other systems reviewed and are negative.  EKGs/Labs/Other Studies Reviewed:    The following studies were reviewed today: Coronary angiography March 10, 2018:  Conclusion    Coronary artery ectasia involving the  proximal and mid circumflex as well as the proximal to mid RCA.  The left circumflex appears to have eccentric globular filling defect consistent with thrombus.  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  Both vessels have slow filling and slow flow.  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  Normal left ventricular function.  Supraventricular tachycardia 140 bpm upon entering the Cath Lab.  15 mg of IV metoprolol was given and 5 mg aliquots over 15 minutes and led to resolution.   RECOMMENDATIONS:  Dual antiplatelet therapy with aspirin and Plavix versus anticoagulation with warfarin/NOAC -there is more data in favor of anticoagulation than dual antiplatelet therapy.  Would not recommend percutaneous intervention and circumflex.  If LAD aneurysm grows, could consider stent graft.  Aneurysm can be followed by coronary CTA if desired.       EKG:  EKG is  ordered today.  The ekg ordered today demonstrates performed on 03/09/2018 demonstrated PACs, a PVC, incomplete right bundle, and left atrial abnormality.  No acute change when compared to historical EKGs dating back to January 2019.  Recent Labs: 09/25/2017: NT-Pro BNP 43 03/09/2018: B Natriuretic Peptide 74.6 03/10/2018: ALT 26 03/12/2018: BUN 29; Creatinine, Ser 1.13; Hemoglobin 12.9; Platelets 144; Potassium 4.7; Sodium 140  Recent Lipid Panel No results found for: CHOL, TRIG, HDL, CHOLHDL, VLDL, LDLCALC, LDLDIRECT  Physical Exam:    VS:  BP 136/88   Pulse 64   Ht 5\' 2"  (1.575 m)   Wt 185 lb (83.9 kg)   BMI 33.84 kg/m     Wt Readings from Last 3 Encounters:  03/20/18 185 lb (83.9 kg)  03/11/18 181 lb 7 oz (82.3 kg)  02/17/18 180 lb (81.6 kg)     GEN:  Well nourished, well developed in no acute distress HEENT: Normal NECK: No JVD. LYMPHATICS: No  lymphadenopathy CARDIAC: RRR, no murmur, no gallop, no edema. VASCULAR: 2+ bilateral radial and carotid pulses.  No bruits. RESPIRATORY:  Clear to auscultation without rales, wheezing or rhonchi  ABDOMEN: Soft, non-tender, non-distended, No pulsatile mass, MUSCULOSKELETAL: No deformity  SKIN: Warm and dry NEUROLOGIC:  Alert and oriented x 3 PSYCHIATRIC:  Normal affect   ASSESSMENT:    1. Coronary artery aneurysm   2. Chronic diastolic heart failure (HCC)   3. Essential hypertension   4. Hypercholesteremia   5. PSVT (paroxysmal supraventricular tachycardia) (HCC)   6. Chronic anticoagulation    PLAN:    In order of problems listed above:  1. It is difficult to correlate her current symptoms with ischemia.  She did have an enzyme bump while hospitalized.  The new symptom on presentation was dyspnea.  She has tolerated dual antithrombotic therapy with Plavix and Eliquis.  Plan will be to discontinue Plavix in 2 weeks and continue Eliquis.  We will likely perform a coronary CT to establish a baseline with reference to LAD aneurysm for future reference.  Encouraged her to call if prolonged chest discomfort or dyspnea. 2. No evidence of volume overload. 3. Blood pressure control is excellent.  Targeting 140/80 mmHg or less at her age. 4. LDL target less than 70 with most recent value of 89 in July 2019.  No change in current regimen of Crestor 20 mg/day. 5. May need to uptitrate metoprolol tartrate if she continues to have episodes of tachycardia/palpitation. 6. Monitor for bleeding on Eliquis therapy which starting in November will become indefinite treatment given thrombus and coronary aneurysm and also propensity for supraventricular arrhythmias with concern that she is at high risk for developing atrial fibrillation given her age and other risk factors.  Overall, no significant change until Plavix is discontinued in 2 weeks.  Monitor for evidence of bleeding.  Monitor for neurological  complaints.  Left axillary and arm pain are likely not ischemic in origin and require no specific work-up.  She does not have obstructive coronary disease.  Dyspnea could be related to arrhythmia or embolic events related to coronary aneurysm.  Hopefully these have episodes will become less apparent.  Secondary risk  modification includes blood pressure control (aneurysm), anticoagulation (aneurysm), LDL less than 70, blood pressure 140/80 mmHg or less, and activity as tolerated being careful not to fall or injure.  Numerous questions by the daughter who accompanied her were answered.  Greater than 50% of the time during this office visit was spent in education, counseling, and coordination of care related to underlying disease process and testing as outlined.    Medication Adjustments/Labs and Tests Ordered: Current medicines are reviewed at length with the patient today.  Concerns regarding medicines are outlined above.  No orders of the defined types were placed in this encounter.  No orders of the defined types were placed in this encounter.   Patient Instructions  Medication Instructions:  1) DISCONTINUE Plavix on 04/10/18  If you need a refill on your cardiac medications before your next appointment, please call your pharmacy.   Lab work: None If you have labs (blood work) drawn today and your tests are completely normal, you will receive your results only by: Marland Kitchen MyChart Message (if you have MyChart) OR . A paper copy in the mail If you have any lab test that is abnormal or we need to change your treatment, we will call you to review the results.  Testing/Procedures: None  Follow-Up: At [facility], you and your health needs are our priority.  As part of our continuing mission to provide you with exceptional heart care, we have created designated Provider Care Teams.  These Care Teams include your primary Cardiologist (physician) and Advanced Practice Providers (APPs -   Physician Assistants and Nurse Practitioners) who all work together to provide you with the care you need, when you need it. You will need a follow up appointment in 3-4 months.  Please call our office 2 months in advance to schedule this appointment.  You may see Lesleigh Noe, MD or one of the following Advanced Practice Providers on your designated Care Team:   Norma Fredrickson, NP Nada Boozer, NP . Georgie Chard, NP  Any Other Special Instructions Will Be Listed Below (If Applicable).  Please contact the office if you have episodes of shortness of breath or racing heart rate.       Signed, Lesleigh Noe, MD  03/20/2018 10:25 AM    [facility]

## 2018-03-20 ENCOUNTER — Ambulatory Visit: Payer: Medicare Other | Admitting: Interventional Cardiology

## 2018-03-20 ENCOUNTER — Encounter: Payer: Self-pay | Admitting: Interventional Cardiology

## 2018-03-20 VITALS — BP 136/88 | HR 64 | Ht 62.0 in | Wt 185.0 lb

## 2018-03-20 DIAGNOSIS — I10 Essential (primary) hypertension: Secondary | ICD-10-CM

## 2018-03-20 DIAGNOSIS — I5032 Chronic diastolic (congestive) heart failure: Secondary | ICD-10-CM

## 2018-03-20 DIAGNOSIS — E7800 Pure hypercholesterolemia, unspecified: Secondary | ICD-10-CM | POA: Diagnosis not present

## 2018-03-20 DIAGNOSIS — Z7901 Long term (current) use of anticoagulants: Secondary | ICD-10-CM

## 2018-03-20 DIAGNOSIS — I2541 Coronary artery aneurysm: Secondary | ICD-10-CM | POA: Diagnosis not present

## 2018-03-20 DIAGNOSIS — I471 Supraventricular tachycardia: Secondary | ICD-10-CM

## 2018-03-20 NOTE — Patient Instructions (Signed)
Medication Instructions:  1) DISCONTINUE Plavix on 04/10/18  If you need a refill on your cardiac medications before your next appointment, please call your pharmacy.   Lab work: None If you have labs (blood work) drawn today and your tests are completely normal, you will receive your results only by: Marland Kitchen MyChart Message (if you have MyChart) OR . A paper copy in the mail If you have any lab test that is abnormal or we need to change your treatment, we will call you to review the results.  Testing/Procedures: None  Follow-Up: At [facility], you and your health needs are our priority.  As part of our continuing mission to provide you with exceptional heart care, we have created designated Provider Care Teams.  These Care Teams include your primary Cardiologist (physician) and Advanced Practice Providers (APPs -  Physician Assistants and Nurse Practitioners) who all work together to provide you with the care you need, when you need it. You will need a follow up appointment in 3-4 months.  Please call our office 2 months in advance to schedule this appointment.  You may see Lesleigh Noe, MD or one of the following Advanced Practice Providers on your designated Care Team:   Norma Fredrickson, NP Nada Boozer, NP . Georgie Chard, NP  Any Other Special Instructions Will Be Listed Below (If Applicable).  Please contact the office if you have episodes of shortness of breath or racing heart rate.

## 2018-04-01 ENCOUNTER — Encounter (HOSPITAL_COMMUNITY): Payer: Self-pay

## 2018-04-01 NOTE — Telephone Encounter (Signed)
Patient had her follow up appt with Dr. Katrinka Blazing on 03/20/18, will pass to RN for review.

## 2018-04-01 NOTE — Telephone Encounter (Signed)
Attempted to contact pt on both home and cell number, voicemail not set up.  Mailed letter

## 2018-04-09 NOTE — Telephone Encounter (Signed)
Attempted to contact pt in regards CR, was not able to leave a VM.

## 2018-04-15 ENCOUNTER — Other Ambulatory Visit: Payer: Self-pay | Admitting: *Deleted

## 2018-04-16 ENCOUNTER — Telehealth (HOSPITAL_COMMUNITY): Payer: Self-pay

## 2018-04-16 NOTE — Telephone Encounter (Signed)
Daughter of patient called in regards to Cardiac Rehab - Daughter had questions in regards to days and times of program. Daughter stated she is going to get in contact with her sister to see if she can help with transportation of patient and call back.

## 2018-04-16 NOTE — Telephone Encounter (Signed)
Called and spoke with patient in regards to Cardiac rehab - pt stated she is interested but will need to speak with her daughter in regards to transportation. Verbalized understanding to pt and will contact if no returned phone call.

## 2018-04-18 ENCOUNTER — Telehealth (HOSPITAL_COMMUNITY): Payer: Self-pay

## 2018-04-18 NOTE — Telephone Encounter (Signed)
Daughter of patient called back on behalf of patient in regards to Cardiac Rehab - Scheduled orientation on 06/12/17 at 8:15am. Patient will attend the 11:15am exc class. Mailed packet.

## 2018-06-04 ENCOUNTER — Telehealth (HOSPITAL_COMMUNITY): Payer: Self-pay

## 2018-06-04 NOTE — Telephone Encounter (Signed)
Cardiac Rehab Medication Review by a Pharmacist  Medication history was not completed due to the patient requesting to cancel the appointment. She states that she is having a work-up done for a knee surgery and cannot pursue cardiac rehab at this time.  Will complete medication and allergy review once a new appointment is scheduled.  Danae Orleans, PharmD PGY1 Pharmacy Resident Phone [phone] 06/04/2018       12:56 PM

## 2018-06-06 ENCOUNTER — Other Ambulatory Visit: Payer: Self-pay | Admitting: Interventional Cardiology

## 2018-06-06 MED ORDER — APIXABAN 5 MG PO TABS: 5.0000 mg | ORAL_TABLET | Freq: Two times a day (BID) | ORAL | 9 refills | Status: DC

## 2018-06-06 NOTE — Telephone Encounter (Signed)
Pt is a 85 year old female who saw Dr. Katrinka Blazing on 03/20/18. Weight at that visit was 83.9Kg. SCr on 03/12/18 was 1.13, will refill Eliquis 5mg  BID.

## 2018-06-06 NOTE — Telephone Encounter (Signed)
Pt calling requesting a refill on Eliquis sent to [facility] on [address]. Please address

## 2018-06-12 ENCOUNTER — Ambulatory Visit (HOSPITAL_COMMUNITY): Payer: Medicare Other

## 2018-06-18 ENCOUNTER — Ambulatory Visit (HOSPITAL_COMMUNITY): Payer: Medicare Other

## 2018-06-20 ENCOUNTER — Ambulatory Visit (HOSPITAL_COMMUNITY): Payer: Medicare Other

## 2018-06-23 ENCOUNTER — Ambulatory Visit (HOSPITAL_COMMUNITY): Payer: Medicare Other

## 2018-06-25 ENCOUNTER — Ambulatory Visit (HOSPITAL_COMMUNITY): Payer: Medicare Other

## 2018-06-26 DIAGNOSIS — I2541 Coronary artery aneurysm: Secondary | ICD-10-CM | POA: Insufficient documentation

## 2018-06-26 NOTE — Progress Notes (Signed)
Cardiology Office Note:    Date:  06/27/2018   ID:  Lynn DakinsWillie Mae Sim[place], [place] 04/10/1932, MRN [id number]  PCP:  Laurann MontanaWhite, Cynthia, MD  Cardiologist:  Lesleigh NoeHenry W Smith III, MD   Referring MD: Laurann MontanaWhite, Cynthia, MD   Chief Complaint  Patient presents with  . Congestive Heart Failure  . Coronary Artery Disease    History of Present Illness:    Lynn Brown is a 85 y.o. female with a hx of hypertension, prior history of nephrectomy as a kidney donor, hyperlipidemia, chronic lower extremity edema, hyperlipidemia, hypothyroidism, "leaky heart valve", LAD aneurysm/circumflex ectasia/RCA ectasia.  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  Has done relatively well since the last office visit in late October 2019.  Acute coronary syndrome earlier in October was felt to be related to coronary aneurysm thrombosis with distal embolization.  She has been managed with antiplatelet/anticoagulation therapy since that time.  No significant bleeding.  Plavix was discontinued after 1 month of dual therapy and she remains on Eliquis currently.  No subsequent recurrent episodes in the interval since last being seen.  Historically, she has had dyspnea and atypical chest pain for many years.  The most recent hospitalization was associated with chest discomfort that radiated into the left axilla and into the arm.  She has not had recurrence of similar symptoms.  Breathing has improved.  She denies orthopnea and PND.  No bleeding on Eliquis.  Plavix has been discontinued.  Having difficulty affording Eliquis.  Feels tired a lot and therefore does not go out of the house very often.  Past Medical History:  Diagnosis Date  . Allergic rhinitis   . Anxiety   . Bunion    PES PLANUS  . Cervical disc disease   . Hearing loss    HEARING AIDS, BUT DOES NOT WEAR THEM CONSISTENTLY.   Marland Kitchen. Hypercholesteremia   . Hypertension   . Hypothyroid   . Leaky heart valve    DR. HARWANI  . Mild obesity    . Osteoarthritis of knees, bilateral   . Osteoporosis 12/2017   T score -3.3  . Uterine polyp    H/O WITH BLEEDING---DR. FONTAINE  . Venous insufficiency     Past Surgical History:  Procedure Laterality Date  . CATARACT EXTRACTION, BILATERAL  2013   DR. SHAPIRO   . HYSTEROSCOPY     D & C  . LEFT HEART CATH AND CORONARY ANGIOGRAPHY N/A 03/10/2018   Procedure: LEFT HEART CATH AND CORONARY ANGIOGRAPHY;  Surgeon: Lyn RecordsSmith, Henry W, MD;  Location: [facility];  Service: Cardiovascular;  Laterality: N/A;  . NEPHRECTOMY LIVING DONOR  1978    Current Medications: Current Meds  Medication Sig  . apixaban (ELIQUIS) 5 MG TABS tablet Take 1 tablet (5 mg total) by mouth 2 (two) times daily.  . calcium citrate-vitamin D (CITRACAL+D) 315-200 MG-UNIT per tablet Take 1 tablet by mouth daily.   . cetirizine (ZYRTEC) 10 MG tablet Take 10 mg by mouth daily.  . Cholecalciferol (VITAMIN D) 2000 UNITS tablet Take 2,000 Units by mouth daily.    . Coenzyme Q10 (CO Q 10 PO) Take 1 tablet by mouth daily.  Marland Kitchen. levothyroxine (SYNTHROID, LEVOTHROID) 125 MCG tablet Take 125 mcg by mouth daily before breakfast.   . magnesium oxide (MAG-OX) 400 MG tablet Take 400 mg by mouth daily.  . metoprolol tartrate (LOPRESSOR) 25 MG tablet Take 0.5 tablets (12.5 mg total) by mouth 2 (two) times daily.  . nitroGLYCERIN (NITROSTAT)  0.4 MG SL tablet Place 1 tablet (0.4 mg total) under the tongue every 5 (five) minutes as needed for chest pain.  . Omega-3 Fatty Acids (FISH OIL PO) Take 1 tablet by mouth daily.  . rosuvastatin (CRESTOR) 20 MG tablet Take 20 mg by mouth daily.  . [DISCONTINUED] hydrochlorothiazide (MICROZIDE) 12.5 MG capsule Take 1 capsule (12.5 mg total) by mouth daily.     Allergies:   Patient has no known allergies.   Social History   Socioeconomic History  . Marital status: Widowed    Spouse name: Not on file  . Number of children: 4  . Years of education: Not on file  . Highest education level:  Not on file  Occupational History  . Occupation: RETIRED  Social Needs  . Financial resource strain: Not on file  . Food insecurity:    Worry: Not on file    Inability: Not on file  . Transportation needs:    Medical: Not on file    Non-medical: Not on file  Tobacco Use  . Smoking status: Never Smoker  . Smokeless tobacco: Never Used  Substance and Sexual Activity  . Alcohol use: No    Alcohol/week: 0.0 standard drinks  . Drug use: No  . Sexual activity: Never    Comment: 1st intercourse 85 yo-Fewer than 5 partners  Lifestyle  . Physical activity:    Days per week: Not on file    Minutes per session: Not on file  . Stress: Not on file  Relationships  . Social connections:    Talks on phone: Not on file    Gets together: Not on file    Attends religious service: Not on file    Active member of club or organization: Not on file    Attends meetings of clubs or organizations: Not on file    Relationship status: Not on file  Other Topics Concern  . Not on file  Social History Narrative  . Not on file     Family History: The patient's family history includes CVA in her brother, father, and mother; Cancer in her brother; Diabetes in her sister; Heart attack in her sister; Heart disease in her sister; Hypertension in her brother, brother, father, mother, and sister; Kidney disease in her sister; Kidney failure in her sister; Multiple sclerosis in her sister; Other in her brother; Pancreatic cancer in her sister; Seizures in her brother. There is no history of Breast cancer.  ROS:   Please see the history of present illness.    Leg swelling, unexplained weight gain, fatigue, constipation, joint swelling and shortness of breath with activity.  All other systems reviewed and are negative.  EKGs/Labs/Other Studies Reviewed:    The following studies were reviewed today: Doppler echocardiogram May 2019:  Study Conclusions  - Left ventricle: The cavity size was normal. Wall  thickness was   increased in a pattern of mild LVH. Systolic function was normal.   The estimated ejection fraction was in the range of 55% to 60%.   Wall motion was normal; there were no regional wall motion   abnormalities. Doppler parameters are consistent with abnormal   left ventricular relaxation (grade 1 diastolic dysfunction). - Aortic valve: There was no stenosis. - Mitral valve: There was trivial regurgitation. - Left atrium: The atrium was mildly dilated. - Right ventricle: The cavity size was normal. Systolic function   was normal. - Tricuspid valve: Peak RV-RA gradient (S): 43 mm Hg. - Pulmonary arteries: PA peak pressure:  46 mm Hg (S). - Inferior vena cava: The vessel was normal in size. The   respirophasic diameter changes were in the normal range (= 50%),   consistent with normal central venous pressure.  Impressions:  - Normal LV size with mild LV hypertrophy. EF 55-60%. Normal RV   size and systolic function. No significant valvular   abnormalities. Mild pulmonary hypertension.  EKG:  EKG not repeated  Recent Labs: 09/25/2017: NT-Pro BNP 43 03/09/2018: B Natriuretic Peptide 74.6 03/10/2018: ALT 26 03/12/2018: BUN 29; Creatinine, Ser 1.13; Hemoglobin 12.9; Platelets 144; Potassium 4.7; Sodium 140  Recent Lipid Panel No results found for: CHOL, TRIG, HDL, CHOLHDL, VLDL, LDLCALC, LDLDIRECT  Physical Exam:    VS:  BP 128/72   Pulse 64   Ht 5\' 2"  (1.575 m)   Wt 189 lb (85.7 kg)   SpO2 95%   BMI 34.57 kg/m     Wt Readings from Last 3 Encounters:  06/27/18 189 lb (85.7 kg)  03/20/18 185 lb (83.9 kg)  03/11/18 181 lb 7 oz (82.3 kg)     GEN: Compatible with age and appearance. No acute distress HEENT: Normal NECK: No JVD. LYMPHATICS: No lymphadenopathy CARDIAC: RRR.  No murmur, no gallop, but there is 2+ bilateral ankle to shin edema VASCULAR: 2+ bilateral radial, carotid, and posterior tibial pulses, no carot bruits RESPIRATORY:  Clear to auscultation  without rales, wheezing or rhonchi  ABDOMEN: Soft, non-tender, non-distended, No pulsatile mass, MUSCULOSKELETAL: No deformity  SKIN: Warm and dry NEUROLOGIC:  Alert and oriented x 3 PSYCHIATRIC:  Normal affect   ASSESSMENT:    1. Coronary aneurysm   2. Chronic diastolic heart failure (HCC)   3. Hypercholesteremia   4. Essential hypertension   5. NSTEMI (non-ST elevated myocardial infarction) (HCC)    PLAN:    In order of problems listed above:  1. Management with anticoagulation, apixaban.  This is being well tolerated currently.  We will set the patient up for a coronary CT angiogram to establish a baseline assessment of aneurysm size.  Plan to see the patient in return in 6 months. 2. Neck veins are flat.  There is significant lower extremity edema.  Will slightly intensify diuretic therapy by switching from HCTZ to spironolactone 25 mg/day.  Basic metabolic panel in 7 to 10 days. 3. LDL target is 70 and last evaluation was 89 mg/dL on December 24, 2017.  No change in current therapeutic regimen. 4. Target less than 140/80 mmHg.  Current blood pressure is excellent and with change in diuretic we need to be careful not to over diurese and cause hypotension.  Overall education and awareness concerning primary/secondary risk prevention was discussed in detail: LDL less than 70, hemoglobin A1c less than 7, blood pressure target less than 130/80 mmHg, >150 minutes of moderate aerobic activity per week, avoidance of smoking, weight control (via diet and exercise), and continued surveillance/management of/for obstructive sleep apnea.  7020-month clinical follow-up.  At that time results of CT scan will be evaluated.   Medication Adjustments/Labs and Tests Ordered: Current medicines are reviewed at length with the patient today.  Concerns regarding medicines are outlined above.  Orders Placed This Encounter  Procedures  . CT CORONARY MORPH W/CTA COR W/SCORE W/CA W/CM &/OR WO/CM  . Basic  metabolic panel   Meds ordered this encounter  Medications  . spironolactone (ALDACTONE) 25 MG tablet    Sig: Take 1 tablet (25 mg total) by mouth daily.    Dispense:  90 tablet  Refill:  3    D/c HCTZ    Patient Instructions  Medication Instructions:  1) DISCONTINUE Hydrochlorothiazide 2) START Spironolactone 25mg  once daily  If you need a refill on your cardiac medications before your next appointment, please call your pharmacy.   Lab work: Your physician recommends that you return for lab work in: 7-10 days (BMET)  If you have labs (blood work) drawn today and your tests are completely normal, you will receive your results only by: Marland Kitchen MyChart Message (if you have MyChart) OR . A paper copy in the mail If you have any lab test that is abnormal or we need to change your treatment, we will call you to review the results.  Testing/Procedures: Your physician has requested that you have cardiac CT. Cardiac computed tomography (CT) is a painless test that uses an x-ray machine to take clear, detailed pictures of your heart. For further information please visit [email or web address] Please follow instruction sheet as given.   Follow-Up: At [facility], you and your health needs are our priority.  As part of our continuing mission to provide you with exceptional heart care, we have created designated Provider Care Teams.  These Care Teams include your primary Cardiologist (physician) and Advanced Practice Providers (APPs -  Physician Assistants and Nurse Practitioners) who all work together to provide you with the care you need, when you need it. You will need a follow up appointment in 4 months.  Please call our office 2 months in advance to schedule this appointment.  You may see Lesleigh Noe, MD or one of the following Advanced Practice Providers on your designated Care Team:   Norma Fredrickson, NP Nada Boozer, NP . Georgie Chard, NP  Any Other Special Instructions Will Be  Listed Below (If Applicable).       Signed, Lesleigh Noe, MD  06/27/2018 2:08 PM    [facility]

## 2018-06-27 ENCOUNTER — Encounter: Payer: Self-pay | Admitting: Interventional Cardiology

## 2018-06-27 ENCOUNTER — Ambulatory Visit (HOSPITAL_COMMUNITY): Payer: Medicare Other

## 2018-06-27 ENCOUNTER — Ambulatory Visit: Payer: Medicare Other | Admitting: Interventional Cardiology

## 2018-06-27 VITALS — BP 128/72 | HR 64 | Ht 62.0 in | Wt 189.0 lb

## 2018-06-27 DIAGNOSIS — I5032 Chronic diastolic (congestive) heart failure: Secondary | ICD-10-CM | POA: Diagnosis not present

## 2018-06-27 DIAGNOSIS — I2541 Coronary artery aneurysm: Secondary | ICD-10-CM | POA: Diagnosis not present

## 2018-06-27 DIAGNOSIS — I214 Non-ST elevation (NSTEMI) myocardial infarction: Secondary | ICD-10-CM

## 2018-06-27 DIAGNOSIS — I10 Essential (primary) hypertension: Secondary | ICD-10-CM | POA: Diagnosis not present

## 2018-06-27 DIAGNOSIS — E7800 Pure hypercholesterolemia, unspecified: Secondary | ICD-10-CM

## 2018-06-27 MED ORDER — SPIRONOLACTONE 25 MG PO TABS: 25.0000 mg | ORAL_TABLET | Freq: Every day | ORAL | 3 refills | Status: DC

## 2018-06-27 NOTE — Patient Instructions (Signed)
Medication Instructions:  1) DISCONTINUE Hydrochlorothiazide 2) START Spironolactone 25mg  once daily  If you need a refill on your cardiac medications before your next appointment, please call your pharmacy.   Lab work: Your physician recommends that you return for lab work in: 7-10 days (BMET)  If you have labs (blood work) drawn today and your tests are completely normal, you will receive your results only by: Marland Kitchen MyChart Message (if you have MyChart) OR . A paper copy in the mail If you have any lab test that is abnormal or we need to change your treatment, we will call you to review the results.  Testing/Procedures: Your physician has requested that you have cardiac CT. Cardiac computed tomography (CT) is a painless test that uses an x-ray machine to take clear, detailed pictures of your heart. For further information please visit [email or web address] Please follow instruction sheet as given.   Follow-Up: At [facility], you and your health needs are our priority.  As part of our continuing mission to provide you with exceptional heart care, we have created designated Provider Care Teams.  These Care Teams include your primary Cardiologist (physician) and Advanced Practice Providers (APPs -  Physician Assistants and Nurse Practitioners) who all work together to provide you with the care you need, when you need it. You will need a follow up appointment in 4 months.  Please call our office 2 months in advance to schedule this appointment.  You may see Lesleigh Noe, MD or one of the following Advanced Practice Providers on your designated Care Team:   Norma Fredrickson, NP Nada Boozer, NP . Georgie Chard, NP  Any Other Special Instructions Will Be Listed Below (If Applicable).

## 2018-06-30 ENCOUNTER — Ambulatory Visit (HOSPITAL_COMMUNITY): Payer: Medicare Other

## 2018-07-02 ENCOUNTER — Ambulatory Visit (HOSPITAL_COMMUNITY): Payer: Medicare Other

## 2018-07-04 ENCOUNTER — Ambulatory Visit (HOSPITAL_COMMUNITY): Payer: Medicare Other

## 2018-07-04 ENCOUNTER — Other Ambulatory Visit: Payer: Medicare Other | Admitting: *Deleted

## 2018-07-04 DIAGNOSIS — I10 Essential (primary) hypertension: Secondary | ICD-10-CM

## 2018-07-04 DIAGNOSIS — I5032 Chronic diastolic (congestive) heart failure: Secondary | ICD-10-CM

## 2018-07-05 LAB — BASIC METABOLIC PANEL
BUN/Creatinine Ratio: 18 (ref 12–28)
BUN: 19 mg/dL (ref 8–27)
CO2: 25 mmol/L (ref 20–29)
Calcium: 9.4 mg/dL (ref 8.7–10.3)
Chloride: 103 mmol/L (ref 96–106)
Creatinine, Ser: 1.03 mg/dL — ABNORMAL HIGH (ref 0.57–1.00)
GFR calc Af Amer: 58 mL/min/{1.73_m2} — ABNORMAL LOW (ref >59)
GFR calc non Af Amer: 50 mL/min/{1.73_m2} — ABNORMAL LOW (ref >59)
Glucose: 87 mg/dL (ref 65–99)
Potassium: 4.1 mmol/L (ref 3.5–5.2)
Sodium: 142 mmol/L (ref 134–144)

## 2018-07-07 ENCOUNTER — Ambulatory Visit (HOSPITAL_COMMUNITY): Payer: Medicare Other

## 2018-07-08 ENCOUNTER — Telehealth (HOSPITAL_COMMUNITY): Payer: Self-pay | Admitting: Emergency Medicine

## 2018-07-08 NOTE — Telephone Encounter (Signed)
Reaching out to patient to offer assistance regarding upcoming cardiac imaging study; pt verbalizes understanding of appt date/time, parking situation and where to check in, pre-test NPO status and medications ordered, and verified current allergies; name and call back number provided for further questions should they arise Amazing Cowman RN Navigator Cardiac Imaging [facility] [phone] office [phone] cell 

## 2018-07-09 ENCOUNTER — Inpatient Hospital Stay (HOSPITAL_COMMUNITY): Admission: RE | Admit: 2018-07-09 | Payer: Medicare Other | Source: Ambulatory Visit

## 2018-07-09 ENCOUNTER — Telehealth: Payer: Self-pay

## 2018-07-09 ENCOUNTER — Telehealth (HOSPITAL_COMMUNITY): Payer: Self-pay | Admitting: Emergency Medicine

## 2018-07-09 NOTE — Telephone Encounter (Signed)
Patients daughter Lynn Brown called stating that a nurse called about her CT scan tomorrow and told her not to take the blood pressure medication that Dr. Katrinka Blazing prescribed on 06/27/18 which is Spironolactone and to take the old blood pressure medication HCTZ tomorrow. I saw where you called the patient yesterday. I am not sure if you told her to take anything tomorrow or not? Please call Lynn Brown back.

## 2018-07-09 NOTE — Telephone Encounter (Signed)
I was able to clarify with patient the instructions for her coronary CTA tomorrow. She is to skip the morning dose of Spironolactone. I reinforced importance of taking metoprolol BID as prescribed.   Rockwell AlexandriaSara Glendale Youngblood RN

## 2018-07-10 ENCOUNTER — Ambulatory Visit (HOSPITAL_COMMUNITY)
Admission: RE | Admit: 2018-07-10 | Discharge: 2018-07-10 | Disposition: A | Discharge status: Home / Self Care | Payer: Medicare Other | Source: Ambulatory Visit | Attending: Interventional Cardiology | Admitting: Interventional Cardiology

## 2018-07-10 ENCOUNTER — Other Ambulatory Visit (HOSPITAL_COMMUNITY): Payer: Self-pay

## 2018-07-10 ENCOUNTER — Other Ambulatory Visit: Payer: Medicare Other

## 2018-07-10 DIAGNOSIS — I5032 Chronic diastolic (congestive) heart failure: Secondary | ICD-10-CM | POA: Diagnosis present

## 2018-07-10 DIAGNOSIS — I2541 Coronary artery aneurysm: Secondary | ICD-10-CM

## 2018-07-10 MED ORDER — METOPROLOL TARTRATE 5 MG/5ML IV SOLN
INTRAVENOUS | Status: AC
  Administered 2018-07-10: 10 mg
  Filled 2018-07-10: qty 10

## 2018-07-11 ENCOUNTER — Ambulatory Visit (HOSPITAL_COMMUNITY): Payer: Medicare Other

## 2018-07-14 ENCOUNTER — Ambulatory Visit (HOSPITAL_COMMUNITY): Payer: Medicare Other

## 2018-07-16 ENCOUNTER — Ambulatory Visit (HOSPITAL_COMMUNITY): Payer: Medicare Other

## 2018-07-18 ENCOUNTER — Ambulatory Visit (HOSPITAL_COMMUNITY): Payer: Medicare Other

## 2018-07-21 ENCOUNTER — Ambulatory Visit (HOSPITAL_COMMUNITY): Payer: Medicare Other

## 2018-07-23 ENCOUNTER — Ambulatory Visit (HOSPITAL_COMMUNITY): Payer: Medicare Other

## 2018-07-25 ENCOUNTER — Ambulatory Visit (HOSPITAL_COMMUNITY): Payer: Medicare Other

## 2018-07-28 ENCOUNTER — Ambulatory Visit (HOSPITAL_COMMUNITY): Payer: Medicare Other

## 2018-07-30 ENCOUNTER — Ambulatory Visit (HOSPITAL_COMMUNITY): Payer: Medicare Other

## 2018-08-01 ENCOUNTER — Ambulatory Visit (HOSPITAL_COMMUNITY): Payer: Medicare Other

## 2018-08-04 ENCOUNTER — Ambulatory Visit (HOSPITAL_COMMUNITY): Payer: Medicare Other

## 2018-08-06 ENCOUNTER — Ambulatory Visit (HOSPITAL_COMMUNITY): Payer: Medicare Other

## 2018-08-08 ENCOUNTER — Ambulatory Visit (HOSPITAL_COMMUNITY): Payer: Medicare Other

## 2018-08-11 ENCOUNTER — Ambulatory Visit (HOSPITAL_COMMUNITY): Payer: Medicare Other

## 2018-08-13 ENCOUNTER — Ambulatory Visit (HOSPITAL_COMMUNITY): Payer: Medicare Other

## 2018-08-15 ENCOUNTER — Ambulatory Visit (HOSPITAL_COMMUNITY): Payer: Medicare Other

## 2018-08-18 ENCOUNTER — Ambulatory Visit (HOSPITAL_COMMUNITY): Payer: Medicare Other

## 2018-08-20 ENCOUNTER — Ambulatory Visit (HOSPITAL_COMMUNITY): Payer: Medicare Other

## 2018-08-22 ENCOUNTER — Ambulatory Visit (HOSPITAL_COMMUNITY): Payer: Medicare Other

## 2018-08-25 ENCOUNTER — Ambulatory Visit (HOSPITAL_COMMUNITY): Payer: Medicare Other

## 2018-08-27 ENCOUNTER — Ambulatory Visit (HOSPITAL_COMMUNITY): Payer: Medicare Other

## 2018-08-29 ENCOUNTER — Ambulatory Visit (HOSPITAL_COMMUNITY): Payer: Medicare Other

## 2018-09-01 ENCOUNTER — Ambulatory Visit (HOSPITAL_COMMUNITY): Payer: Medicare Other

## 2018-09-03 ENCOUNTER — Ambulatory Visit (HOSPITAL_COMMUNITY): Payer: Medicare Other

## 2018-09-05 ENCOUNTER — Ambulatory Visit (HOSPITAL_COMMUNITY): Payer: Medicare Other

## 2018-09-08 ENCOUNTER — Ambulatory Visit (HOSPITAL_COMMUNITY): Payer: Medicare Other

## 2018-09-10 ENCOUNTER — Ambulatory Visit (HOSPITAL_COMMUNITY): Payer: Medicare Other

## 2018-09-12 ENCOUNTER — Ambulatory Visit (HOSPITAL_COMMUNITY): Payer: Medicare Other

## 2018-09-15 ENCOUNTER — Ambulatory Visit (HOSPITAL_COMMUNITY): Payer: Medicare Other

## 2018-09-17 ENCOUNTER — Ambulatory Visit (HOSPITAL_COMMUNITY): Payer: Medicare Other

## 2018-09-19 ENCOUNTER — Ambulatory Visit (HOSPITAL_COMMUNITY): Payer: Medicare Other

## 2018-10-17 ENCOUNTER — Telehealth: Payer: Self-pay

## 2018-10-17 NOTE — Telephone Encounter (Signed)
Called pt to switch his OV to a Virtual visit with Dr Katrinka Blazing on 6/4. No answer.

## 2018-10-23 ENCOUNTER — Telehealth: Payer: Self-pay

## 2018-10-23 NOTE — Telephone Encounter (Signed)
YOUR CARDIOLOGY TEAM HAS ARRANGED FOR AN E-VISIT FOR YOUR APPOINTMENT - PLEASE REVIEW IMPORTANT INFORMATION BELOW SEVERAL DAYS PRIOR TO YOUR APPOINTMENT ° °Due to the recent COVID-19 pandemic, we are transitioning in-person office visits to tele-medicine visits in an effort to decrease unnecessary exposure to our patients, their families, and staff. These visits are billed to your insurance just like a normal visit is. We also encourage you to sign up for MyChart if you have not already done so. You will need a smartphone if possible. For patients that do not have this, we can still complete the visit using a regular telephone but do prefer a smartphone to enable video when possible. You may have a family member that lives with you that can help. If possible, we also ask that you have a blood pressure cuff and scale at home to measure your blood pressure, heart rate and weight prior to your scheduled appointment. Patients with clinical needs that need an in-person evaluation and testing will still be able to come to the office if absolutely necessary. If you have any questions, feel free to call our office. ° ° ° ° °YOUR PROVIDER WILL BE USING THE FOLLOWING PLATFORM TO COMPLETE YOUR VISIT: Phone Call ° °• IF USING MYCHART - How to Download the MyChart App to Your SmartPhone  ° °- If Apple, go to App Store and type in MyChart in the search bar and download the app. If Android, ask patient to go to Google Play Store and type in MyChart in the search bar and download the app. The app is free but as with any other app downloads, your phone may require you to verify saved payment information or Apple/Android password.  °- You will need to then log into the app with your MyChart username and password, and select [facility] as your healthcare provider to link the account.  °- When it is time for your visit, go to the MyChart app, find appointments, and click Begin Video Visit. Be sure to Select Allow for your device to  access the Microphone and Camera for your visit. You will then be connected, and your provider will be with you shortly. ° **If you have any issues connecting or need assistance, please contact MyChart service desk [phone] ([phone])** ° **If using a computer, in order to ensure the best quality for your visit, you will need to use either of the following Internet Browsers: Google Chrome or Microsoft Edge** ° °• IF USING DOXIMITY or [email or web address] - The staff will give you instructions on receiving your link to join the meeting the day of your visit.  ° ° ° ° °2-3 DAYS BEFORE YOUR APPOINTMENT ° °You will receive a telephone call from one of our [facility] team members - your caller ID may say "Unknown caller." If this is a video visit, we will walk you through how to get the video launched on your phone. We will remind you check your blood pressure, heart rate and weight prior to your scheduled appointment. If you have an Apple Watch or Kardia, please upload any pertinent ECG strips the day before or morning of your appointment to MyChart. Our staff will also make sure you have reviewed the consent and agree to move forward with your scheduled tele-health visit.  ° ° ° °THE DAY OF YOUR APPOINTMENT ° °Approximately 15 minutes prior to your scheduled appointment, you will receive a telephone call from one of HeartCare team - your caller ID may say "Unknown caller."    Our staff will confirm medications, vital signs for the day and any symptoms you may be experiencing. Please have this information available prior to the time of visit start. It may also be helpful for you to have a pad of paper and pen handy for any instructions given during your visit. They will also walk you through joining the smartphone meeting if this is a video visit. ° ° ° °CONSENT FOR TELE-HEALTH VISIT - PLEASE REVIEW ° °I hereby voluntarily request, consent and authorize [facility] and its employed or contracted physicians, physician  assistants, nurse practitioners or other licensed health care professionals (the Practitioner), to provide me with telemedicine health care services (the “Services") as deemed necessary by the treating Practitioner. I acknowledge and consent to receive the Services by the Practitioner via telemedicine. I understand that the telemedicine visit will involve communicating with the Practitioner through live audiovisual communication technology and the disclosure of certain medical information by electronic transmission. I acknowledge that I have been given the opportunity to request an in-person assessment or other available alternative prior to the telemedicine visit and am voluntarily participating in the telemedicine visit. ° °I understand that I have the right to withhold or withdraw my consent to the use of telemedicine in the course of my care at any time, without affecting my right to future care or treatment, and that the Practitioner or I may terminate the telemedicine visit at any time. I understand that I have the right to inspect all information obtained and/or recorded in the course of the telemedicine visit and may receive copies of available information for a reasonable fee.  I understand that some of the potential risks of receiving the Services via telemedicine include:  °• Delay or interruption in medical evaluation due to technological equipment failure or disruption; °• Information transmitted may not be sufficient (e.g. poor resolution of images) to allow for appropriate medical decision making by the Practitioner; and/or  °• In rare instances, security protocols could fail, causing a breach of personal health information. ° °Furthermore, I acknowledge that it is my responsibility to provide information about my medical history, conditions and care that is complete and accurate to the best of my ability. I acknowledge that Practitioner's advice, recommendations, and/or decision may be based on  factors not within their control, such as incomplete or inaccurate data provided by me or distortions of diagnostic images or specimens that may result from electronic transmissions. I understand that the practice of medicine is not an exact science and that Practitioner makes no warranties or guarantees regarding treatment outcomes. I acknowledge that I will receive a copy of this consent concurrently upon execution via email to the email address I last provided but may also request a printed copy by calling the office of [facility].   ° °I understand that my insurance will be billed for this visit.  ° °I have read or had this consent read to me. °• I understand the contents of this consent, which adequately explains the benefits and risks of the Services being provided via telemedicine.  °• I have been provided ample opportunity to ask questions regarding this consent and the Services and have had my questions answered to my satisfaction. °• I give my informed consent for the services to be provided through the use of telemedicine in my medical care ° °By participating in this telemedicine visit I agree to the above. °

## 2018-10-29 NOTE — Progress Notes (Addendum)
Virtual Visit via Video Note   This visit type was conducted due to national recommendations for restrictions regarding the COVID-19 Pandemic (e.g. social distancing) in an effort to limit this patient's exposure and mitigate transmission in our community.  Due to her co-morbid illnesses, this patient is at least at moderate risk for complications without adequate follow up.  This format is felt to be most appropriate for this patient at this time.  All issues noted in this document were discussed and addressed.  A limited physical exam was performed with this format.  Please refer to the patient's chart for her consent to telehealth for [facility].   Date:  11/10/2018   ID:  Lynn Brown, DOB Aug 31, 1931, MRN [id number]  Patient Location: Home Provider Location: Home  PCP:  Laurann Montana, MD  Cardiologist:  Lesleigh Noe, MD  Electrophysiologist:  None   Evaluation Performed:  Follow-Up Visit  Chief Complaint:  CAD / Aneursymal disease  History of Present Illness:    Lynn Brown is a 85 y.o. female with with a hx of hypertension, prior history of nephrectomy as a kidney donor, hyperlipidemia, chronic lower extremity edema, hyperlipidemia, hypothyroidism,"leaky heart valve",LAD aneurysm/circumflex ectasia/RCA ectasia. 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  No episodes of chest pain.  Frequent monitoring of blood pressures.  Wide fluctuations noted with systolic pressures in the high 90s on rare occasion and as high as 160 mmHg on others.  She has not had shortness of breath with lying or while at rest.  There is shortness of breath if she bends over and occasionally when walking.  Still has peripheral edema.  The patient does not have symptoms concerning for COVID-19 infection (fever, chills, cough, or new shortness of breath).    Past Medical History:  Diagnosis Date  . Allergic rhinitis   . Anxiety   . Bunion    PES PLANUS   . Cervical disc disease   . Hearing loss    HEARING AIDS, BUT DOES NOT WEAR THEM CONSISTENTLY.   Marland Kitchen Hypercholesteremia   . Hypertension   . Hypothyroid   . Leaky heart valve    DR. HARWANI  . Mild obesity   . Osteoarthritis of knees, bilateral   . Osteoporosis 12/2017   T score -3.3  . Uterine polyp    H/O WITH BLEEDING---DR. FONTAINE  . Venous insufficiency    Past Surgical History:  Procedure Laterality Date  . CATARACT EXTRACTION, BILATERAL  2013   DR. SHAPIRO   . HYSTEROSCOPY     D & C  . LEFT HEART CATH AND CORONARY ANGIOGRAPHY N/A 03/10/2018   Procedure: LEFT HEART CATH AND CORONARY ANGIOGRAPHY;  Surgeon: Lyn Records, MD;  Location: [facility] INV[facility];  Service: Cardiovascular;  Laterality: N/A;  . NEPHRECTOMY LIVING DONOR  1978     Current Meds  Medication Sig  . apixaban (ELIQUIS) 5 MG TABS tablet Take 1 tablet (5 mg total) by mouth 2 (two) times daily.  . calcium citrate-vitamin D (CITRACAL+D) 315-200 MG-UNIT per tablet Take 1 tablet by mouth daily.   . cetirizine (ZYRTEC) 10 MG tablet Take 10 mg by mouth daily.  . Cholecalciferol (VITAMIN D) 2000 UNITS tablet Take 2,000 Units by mouth daily.    . Coenzyme Q10 (CO Q 10 PO) Take 1 tablet by mouth daily.  Marland Kitchen levothyroxine (SYNTHROID, LEVOTHROID) 125 MCG tablet Take 125 mcg by mouth daily before breakfast.   . magnesium oxide (MAG-OX) 400 MG  tablet Take 400 mg by mouth daily.  . metoprolol tartrate (LOPRESSOR) 25 MG tablet Take 0.5 tablets (12.5 mg total) by mouth 2 (two) times daily.  . nitroGLYCERIN (NITROSTAT) 0.4 MG SL tablet Place 1 tablet (0.4 mg total) under the tongue every 5 (five) minutes as needed for chest pain.  . Omega-3 Fatty Acids (FISH OIL PO) Take 1 tablet by mouth daily.  . rosuvastatin (CRESTOR) 20 MG tablet Take 20 mg by mouth daily.  Marland Kitchen spironolactone (ALDACTONE) 25 MG tablet Take 1 tablet (25 mg total) by mouth daily.     Allergies:   Patient has no known allergies.   Social History    Tobacco Use  . Smoking status: Never Smoker  . Smokeless tobacco: Never Used  Substance Use Topics  . Alcohol use: No    Alcohol/week: 0.0 standard drinks  . Drug use: No     Family Hx: The patient's family history includes CVA in her brother, father, and mother; Cancer in her brother; Diabetes in her sister; Heart attack in her sister; Heart disease in her sister; Hypertension in her brother, brother, father, mother, and sister; Kidney disease in her sister; Kidney failure in her sister; Multiple sclerosis in her sister; Other in her brother; Pancreatic cancer in her sister; Seizures in her brother. There is no history of Breast cancer.  ROS:   Please see the history of present illness.    No syncope. All other systems reviewed and are negative.   Prior CV studies:   The following studies were reviewed today:  No new imaging data.  Labs/Other Tests and Data Reviewed:    EKG:  No ECG reviewed.  Recent Labs: 03/09/2018: B Natriuretic Peptide 74.6 03/10/2018: ALT 26 03/12/2018: Hemoglobin 12.9; Platelets 144 07/04/2018: BUN 19; Creatinine, Ser 1.03; Potassium 4.1; Sodium 142   Recent Lipid Panel No results found for: CHOL, TRIG, HDL, CHOLHDL, LDLCALC, LDLDIRECT  Wt Readings from Last 3 Encounters:  10/30/18 188 lb 6.4 oz (85.5 kg)  06/27/18 189 lb (85.7 kg)  03/20/18 185 lb (83.9 kg)     Objective:    Vital Signs:  BP 129/78   Pulse 64   Ht  (1.575 m)   Wt 188 lb 6.4 oz (85.5 kg)   BMI 34.46 kg/m    VITAL SIGNS:  reviewed  ASSESSMENT & PLAN:    1. Chronic diastolic heart failure (HCC)   2. Essential hypertension   3. Hypercholesteremia   4. Coronary aneurysm   5. NSTEMI (non-ST elevated myocardial infarction) (HCC)   6. Educated About Covid-19 Virus Infection    PLAN:  1. Stable from the standpoint of symptomatic heart failure.  Still with peripheral edema according to the patient.  Therefore probably has mild volume overload.  With the fluctuations  in blood pressure this year records, I will not make adjustment in diuretic therapy because I am uncertain if the information is accurate. 2. Target blood pressure 140/80 mmHg or less 3. Target LDL less than 70 4. Coronary aneurysm will be treated as atherosclerotic disease.  Secondary risk mitigation is appropriate.  Plan face-to-face clinical follow-up and 4 months.  COVID-19 Education: The signs and symptoms of COVID-19 were discussed with the patient and how to seek care for testing (follow up with PCP or arrange E-visit).  The importance of social distancing was discussed today.  Time:   Today, I have spent 10 minutes with the patient with telehealth technology discussing the above problems.     Medication Adjustments/Labs  and Tests Ordered: Current medicines are reviewed at length with the patient today.  Concerns regarding medicines are outlined above.   Tests Ordered: No orders of the defined types were placed in this encounter.   Medication Changes: No orders of the defined types were placed in this encounter.   Disposition:  Follow up in 4 month(s)  Signed, Lesleigh NoeHenry W  III, MD  11/10/2018 3:40 PM    [facility]

## 2018-10-30 ENCOUNTER — Encounter: Payer: Self-pay | Admitting: Interventional Cardiology

## 2018-10-30 ENCOUNTER — Other Ambulatory Visit: Payer: Self-pay

## 2018-10-30 ENCOUNTER — Telehealth (INDEPENDENT_AMBULATORY_CARE_PROVIDER_SITE_OTHER): Payer: Medicare Other | Admitting: Interventional Cardiology

## 2018-10-30 VITALS — BP 129/78 | HR 64 | Ht 62.0 in | Wt 188.4 lb

## 2018-10-30 DIAGNOSIS — I5032 Chronic diastolic (congestive) heart failure: Secondary | ICD-10-CM | POA: Diagnosis not present

## 2018-10-30 DIAGNOSIS — Z7189 Other specified counseling: Secondary | ICD-10-CM

## 2018-10-30 DIAGNOSIS — I214 Non-ST elevation (NSTEMI) myocardial infarction: Secondary | ICD-10-CM

## 2018-10-30 DIAGNOSIS — E7800 Pure hypercholesterolemia, unspecified: Secondary | ICD-10-CM

## 2018-10-30 DIAGNOSIS — I2541 Coronary artery aneurysm: Secondary | ICD-10-CM

## 2018-10-30 DIAGNOSIS — I10 Essential (primary) hypertension: Secondary | ICD-10-CM

## 2018-10-30 NOTE — Patient Instructions (Signed)
Medication Instructions:  Your physician recommends that you continue on your current medications as directed. Please refer to the Current Medication list given to you today.  If you need a refill on your cardiac medications before your next appointment, please call your pharmacy.   Lab work: None If you have labs (blood work) drawn today and your tests are completely normal, you will receive your results only by: . MyChart Message (if you have MyChart) OR . A paper copy in the mail If you have any lab test that is abnormal or we need to change your treatment, we will call you to review the results.  Testing/Procedures: None  Follow-Up: At [facility], you and your health needs are our priority.  As part of our continuing mission to provide you with exceptional heart care, we have created designated Provider Care Teams.  These Care Teams include your primary Cardiologist (physician) and Advanced Practice Providers (APPs -  Physician Assistants and Nurse Practitioners) who all work together to provide you with the care you need, when you need it. You will need a follow up appointment in 4 months.  Please call our office 2 months in advance to schedule this appointment.  You may see Henry W Smith III, MD or one of the following Advanced Practice Providers on your designated Care Team:   Lori Gerhardt, NP Laura Ingold, NP . Jill McDaniel, NP  Any Other Special Instructions Will Be Listed Below (If Applicable).    

## 2018-11-10 ENCOUNTER — Telehealth: Payer: Self-pay | Admitting: Interventional Cardiology

## 2018-11-10 MED ORDER — AMLODIPINE BESYLATE 2.5 MG PO TABS: 2.5000 mg | ORAL_TABLET | Freq: Every day | ORAL | 3 refills | Status: DC

## 2018-11-10 NOTE — Telephone Encounter (Signed)
Spoke with daughter and made her aware of recommendations.  Advised to continue to monitor BPs and if no improvement after a couple of days, please contact the office.  Daughter verbalized understanding and was in agreement with this plan.

## 2018-11-10 NOTE — Telephone Encounter (Signed)
° °  Pt c/o BP issue: STAT if pt c/o blurred vision, one-sided weakness or slurred speech  1. What are your last 5 BP readings?  168/93 162/100 170/99 166/105 171/103   2. Are you having any other symptoms (ex. Dizziness, headache, blurred vision, passed out)?  Back cramps on her left side  3. What is your BP issue? Higher than normal  Daughter called to report some BP issues. Daughter states the patient feels fine with the exception of a cramp in her back. Daughter would like to know what to do to bring down her Mom's BP

## 2018-11-10 NOTE — Telephone Encounter (Signed)
These recordings are not life threatening, but to high to have chronically. Plan amlodipine 2.5 mg daily. Continue to monitor. 2 gm sodium diet.

## 2018-11-10 NOTE — Telephone Encounter (Signed)
Spoke with daughter, DPR on file.  Pt's BP usually runs around 117/60-70.  Today's BPs listed below.  Have been very elevated.  Pt prescribed Metoprolol Tartrate 12.5mg  BID.  Pt took 25mg  this morning when she seen BP was elevated.  Then about 11:30A she took another 25mg  tablet because it was so high and daughter called the office.  BP now 156/84, HR 53.  Pt denies lightheadedness, dizziness, blurred vision, HA, CP or SOB.  Does have a cramping feeling in the lower left side of back near her hip.  Advised I will send message to Dr. Tamala Julian for review. Advised to also call back if HR drops into the 30s-40s since she took so much Metoprolol.  Daughter verbalized understanding.

## 2019-01-01 ENCOUNTER — Encounter: Payer: Medicare Other | Admitting: Gynecology

## 2019-01-02 ENCOUNTER — Telehealth: Payer: Self-pay | Admitting: Interventional Cardiology

## 2019-01-02 NOTE — Telephone Encounter (Signed)
Spoke with pt and she said she is NOT having any side effects with her Eliquis.  She had read about the side effects and was concerned.  Advised side effects are pretty generic across the board for blood thinners so she would have the same chances with each kind.  Inquired about cost.  She said it went from being $47/mo to $80something.  Asked about donut hole and she said pharmacy said that was what happened.  Advised pt this occurs with a lot of people each year and you have to pay a higher co pay for a month or two and typically come back out of it.  Advised to check with pharmacy and insurance to see how long she would have to pay this higher co pay before she came back out of the donut hole.  I did mention that Warfarin was a cheaper medication but then she has to have INR checks which require her to come to the office and that a lot of times, the price ends up being about the same by the time she pays co pays for coumadin checks.  Pt will check with pharmacy about higher co pay and call back if she ends up needing to switch medications.  Pt appreciative for call.

## 2019-01-02 NOTE — Telephone Encounter (Signed)
Patient wants to know if there is something else besides Eliquis that Dr. Tamala Julian can prescribe to her.  She doesn't like some of the side effects it's giving her, also the price of her copay for it recently went up and she can't afford it.

## 2019-01-27 ENCOUNTER — Other Ambulatory Visit: Payer: Self-pay | Admitting: Family Medicine

## 2019-01-27 ENCOUNTER — Telehealth: Payer: Self-pay | Admitting: Interventional Cardiology

## 2019-01-27 DIAGNOSIS — Z1231 Encounter for screening mammogram for malignant neoplasm of breast: Secondary | ICD-10-CM

## 2019-01-27 NOTE — Telephone Encounter (Signed)
Spoke with daughter to clarify.  She states they are possibly switching pt's medications to [facility] to try to help with lowering cost.  She said [facility] needed a copy of cardiac medications so they can look into cost through their facility.  Advised I will send list over. Daughter appreciative for call.

## 2019-01-27 NOTE — Telephone Encounter (Signed)
New message   Per patient's daughter needs a listing of the heart medication that the patient takes the phone # is [phone] and the fax # is [phone] it needs to sent to [facility].

## 2019-01-28 ENCOUNTER — Other Ambulatory Visit: Payer: Self-pay

## 2019-01-29 ENCOUNTER — Encounter: Payer: Self-pay | Admitting: Gynecology

## 2019-01-29 ENCOUNTER — Ambulatory Visit (INDEPENDENT_AMBULATORY_CARE_PROVIDER_SITE_OTHER): Payer: Medicare Other | Admitting: Gynecology

## 2019-01-29 ENCOUNTER — Telehealth: Payer: Self-pay | Admitting: *Deleted

## 2019-01-29 VITALS — BP 124/82 | Ht 61.0 in | Wt 191.0 lb

## 2019-01-29 DIAGNOSIS — N8189 Other female genital prolapse: Secondary | ICD-10-CM | POA: Diagnosis not present

## 2019-01-29 DIAGNOSIS — N952 Postmenopausal atrophic vaginitis: Secondary | ICD-10-CM | POA: Diagnosis not present

## 2019-01-29 DIAGNOSIS — M810 Age-related osteoporosis without current pathological fracture: Secondary | ICD-10-CM | POA: Diagnosis not present

## 2019-01-29 DIAGNOSIS — Z01419 Encounter for gynecological examination (general) (routine) without abnormal findings: Secondary | ICD-10-CM

## 2019-01-29 NOTE — Progress Notes (Signed)
    Sherita Decoste [place] 11/27/1931 [id number]        85 y.o.  [id number] for annual gynecologic exam.  Without gynecologic complaints  Past medical history,surgical history, problem list, medications, allergies, family history and social history were all reviewed and documented as reviewed in the EPIC chart.  ROS:  Performed with pertinent positives and negatives included in the history, assessment and plan.   Additional significant findings : None   Exam: Caryn Bee assistant Vitals:   01/29/19 1028  BP: 124/82  Weight: 191 lb (86.6 kg)  Height: 5\' 1"  (1.549 m)   Body mass index is 36.09 kg/m.  General appearance:  Normal affect, orientation and appearance. Skin: Grossly normal HEENT: Without gross lesions.  No cervical or supraclavicular adenopathy. Thyroid normal.  Lungs:  Clear without wheezing, rales or rhonchi Cardiac: RR, without RMG Abdominal:  Soft, nontender, without masses, guarding, rebound, organomegaly or hernia Breasts:  Examined lying and sitting without masses, retractions, discharge or axillary adenopathy. Pelvic:  Ext, BUS, Vagina: With atrophic changes.  Mild cystocele and rectocele  Cervix: Flush with upper vagina with atrophic changes  Uterus: Difficult to palpate but no gross masses or tenderness  Adnexa: Without masses or tenderness    Anus and perineum: Normal   Rectovaginal: Normal sphincter tone without palpated masses or tenderness.    Assessment/Plan:  85 y.o. [id number] female for annual gynecologic exam.   1. Postmenopausal/atrophic genital changes.  No significant menopausal symptoms or any vaginal bleeding. 2. Mild pelvic relaxation.  Asymptomatic to the patient.  Stable on serial exams. 3. Osteoporosis.  DEXA 2019 T score -3.3.  Was recommended for office visit to discuss treatment options but had not followed up for this.  History of Boniva number of years ago.  We discussed osteoporosis and her increased fracture risk.  Devastating sequela I after  hip fractures reviewed.  My recommendation would be to start treatment.  We discussed treatment options to include bisphosphate's such as alendronate, Actonel/Boniva, Reclast.  We also discussed Prolia which may be a better choice as I am afraid she will forget the pills.  I discussed the risks and side effects to include osteonecrosis of the jaw, atypical fractures, infections and rashes.  The patient agrees to go ahead and start the Prolia and we will go ahead and make arrangements for that.  We will continue this every 6 months and repeat her bone density next year. 4. Mammography coming due and I reminded her to schedule this.  Breast exam normal today. 5. Colonoscopy reported 2018. 6. Pap smear 2009.  No Pap smear done today.  No history of significant abnormal Pap smears.  We both agree to stop screening per current screening guidelines. 7. Health maintenance.  No routine lab work done as patient does this elsewhere.  Follow-up 1 year, sooner as needed.   Anastasio Auerbach MD, 11:03 AM 01/29/2019

## 2019-01-29 NOTE — Patient Instructions (Signed)
The office will call you to arrange for the Prolia shots for the osteoporosis.  These will be an every 79-month injection in our office.  Follow-up in 1 year for annual exam.

## 2019-01-30 NOTE — Telephone Encounter (Signed)
Arrange for Prolia. Diagnoses postmenopausal osteoporosis T score -3.3

## 2019-01-30 NOTE — Telephone Encounter (Signed)
Prolia insurance verification has been sent awaiting Summary of benefits  

## 2019-02-13 ENCOUNTER — Other Ambulatory Visit: Payer: Self-pay | Admitting: Interventional Cardiology

## 2019-02-13 NOTE — Telephone Encounter (Signed)
New Message    *STAT* If patient is at the pharmacy, call can be transferred to refill team.   1. Which medications need to be refilled? (please list name of each medication and dose if known) apixaban (ELIQUIS) 5 MG TABS tablet , amLODipine (NORVASC) 2.5 MG tablet and spironolactone (ALDACTONE) 25 MG tablet      2. Which pharmacy/location (including street and city if local pharmacy) is medication to be sent to? [facility]  3. Do they need a 30 day or 90 day supply? 90  A new Rx needs to be sent to [facility] that is who the patient will be using for her prescriptions.

## 2019-02-16 MED ORDER — SPIRONOLACTONE 25 MG PO TABS: 25.0000 mg | ORAL_TABLET | Freq: Every day | ORAL | 2 refills | Status: DC

## 2019-02-16 MED ORDER — APIXABAN 5 MG PO TABS: 5.0000 mg | ORAL_TABLET | Freq: Two times a day (BID) | ORAL | 1 refills | Status: DC

## 2019-02-16 MED ORDER — AMLODIPINE BESYLATE 2.5 MG PO TABS: 2.5000 mg | ORAL_TABLET | Freq: Every day | ORAL | 2 refills | Status: DC

## 2019-02-16 NOTE — Telephone Encounter (Signed)
43f Scr 86.6kg Scr 1.03 07/04/18 Lovw/smith 10/30/18

## 2019-02-18 NOTE — Telephone Encounter (Signed)
Deductible n/a  OOP MAX [id number])  Annual exam 01/29/2019 TF  Calcium  9.4           Date 07/04/2018  Upcoming dental procedures   Prior Authorization needed   Pt estimated Cost $243  PT WOULD LIKE A CALL BACK IN 1 WEEK TO THINK OUT GETTING THE PROLIA DUE TO COST    Coverage Details:20% ONE DOSE, $30 ADMIN FEE

## 2019-03-05 ENCOUNTER — Encounter: Payer: Self-pay | Admitting: Gynecology

## 2019-03-09 NOTE — Telephone Encounter (Signed)
Will close encounter unable to leave message for pt no voice mail set up

## 2019-03-30 ENCOUNTER — Ambulatory Visit
Admission: RE | Admit: 2019-03-30 | Discharge: 2019-03-30 | Disposition: A | Discharge status: Home / Self Care | Payer: Medicare Other | Source: Ambulatory Visit | Attending: Family Medicine | Admitting: Family Medicine

## 2019-03-30 ENCOUNTER — Other Ambulatory Visit: Payer: Self-pay

## 2019-03-30 DIAGNOSIS — Z1231 Encounter for screening mammogram for malignant neoplasm of breast: Secondary | ICD-10-CM

## 2019-04-15 NOTE — Progress Notes (Signed)
Cardiology Office Note:    Date:  04/17/2019   ID:  Lynn DakinsWillie Mae Brown, [place] 05/26/1932, MRN [id number]  PCP:  Laurann MontanaWhite, Cynthia, MD  Cardiologist:  Lesleigh NoeHenry W Dorthula Bier III, MD   Referring MD: Laurann MontanaWhite, Cynthia, MD   Chief Complaint  Patient presents with  . Coronary Artery Disease    History of Present Illness:    Lynn Brown is a 85 y.o. female with a hx of hypertension, prior history of nephrectomy as a kidney donor, hyperlipidemia, chronic lower extremity edema, hyperlipidemia, hypothyroidism,"leaky heart valve",LAD aneurysm/circumflex ectasia/RCA ectasia. 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  Eliquis was started as therapy for coronary microembolization from aneurysm.  This medication can be sacrificed and aspirin substituted if needed.  Denies recurrent chest discomfort.  No bleeding on her current medications.  Denies nitroglycerin use.  Denies orthopnea and PND.  Relatively sedentary at home.  Walks with a cane.  Trace lower extremity edema.  No blood in her urine or stool.  Past Medical History:  Diagnosis Date  . Allergic rhinitis   . Anxiety   . Bunion    PES PLANUS  . Cervical disc disease   . Hearing loss    HEARING AIDS, BUT DOES NOT WEAR THEM CONSISTENTLY.   Marland Kitchen. Hypercholesteremia   . Hypertension   . Hypothyroid   . Leaky heart valve    DR. HARWANI  . Mild obesity   . Osteoarthritis of knees, bilateral   . Osteoporosis 12/2017   T score -3.3  . Venous insufficiency     Past Surgical History:  Procedure Laterality Date  . CATARACT EXTRACTION, BILATERAL  2013   DR. SHAPIRO   . HYSTEROSCOPY     D & C  . LEFT HEART CATH AND CORONARY ANGIOGRAPHY N/A 03/10/2018   Procedure: LEFT HEART CATH AND CORONARY ANGIOGRAPHY;  Surgeon: Lyn RecordsSmith, Natassia Guthridge W, MD;  Location: [facility];  Service: Cardiovascular;  Laterality: N/A;  . NEPHRECTOMY LIVING DONOR  1978    Current Medications: Current Meds  Medication Sig  . amLODipine  (NORVASC) 2.5 MG tablet Take 1 tablet (2.5 mg total) by mouth daily.  Marland Kitchen. apixaban (ELIQUIS) 5 MG TABS tablet Take 1 tablet (5 mg total) by mouth 2 (two) times daily.  . calcium citrate-vitamin D (CITRACAL+D) 315-200 MG-UNIT per tablet Take 1 tablet by mouth daily.   . Cholecalciferol (VITAMIN D) 2000 UNITS tablet Take 2,000 Units by mouth daily.    . Coenzyme Q10 (CO Q 10 PO) Take 1 tablet by mouth daily.  Marland Kitchen. levothyroxine (SYNTHROID, LEVOTHROID) 125 MCG tablet Take 125 mcg by mouth daily before breakfast.   . magnesium oxide (MAG-OX) 400 MG tablet Take 400 mg by mouth daily.  . metoprolol tartrate (LOPRESSOR) 25 MG tablet Take 0.5 tablets (12.5 mg total) by mouth 2 (two) times daily.  . nitroGLYCERIN (NITROSTAT) 0.4 MG SL tablet Place 1 tablet (0.4 mg total) under the tongue every 5 (five) minutes as needed for chest pain.  . Omega-3 Fatty Acids (FISH OIL PO) Take 1 tablet by mouth daily.  . rosuvastatin (CRESTOR) 20 MG tablet Take 20 mg by mouth daily.  Marland Kitchen. spironolactone (ALDACTONE) 25 MG tablet Take 1 tablet (25 mg total) by mouth daily.     Allergies:   Patient has no known allergies.   Social History   Socioeconomic History  . Marital status: Widowed    Spouse name: Not on file  . Number of children: 4  . Years of education:  Not on file  . Highest education level: Not on file  Occupational History  . Occupation: RETIRED  Social Needs  . Financial resource strain: Not on file  . Food insecurity    Worry: Not on file    Inability: Not on file  . Transportation needs    Medical: Not on file    Non-medical: Not on file  Tobacco Use  . Smoking status: Never Smoker  . Smokeless tobacco: Never Used  Substance and Sexual Activity  . Alcohol use: No    Alcohol/week: 0.0 standard drinks  . Drug use: No  . Sexual activity: Never    Comment: 1st intercourse 85 yo-Fewer than 5 partners  Lifestyle  . Physical activity    Days per week: Not on file    Minutes per session: Not on file   . Stress: Not on file  Relationships  . Social Herbalist on phone: Not on file    Gets together: Not on file    Attends religious service: Not on file    Active member of club or organization: Not on file    Attends meetings of clubs or organizations: Not on file    Relationship status: Not on file  Other Topics Concern  . Not on file  Social History Narrative  . Not on file     Family History: The patient's family history includes CVA in her brother, father, and mother; Cancer in her brother; Diabetes in her sister; Heart attack in her sister; Heart disease in her sister; Hypertension in her brother, brother, father, mother, and sister; Kidney disease in her sister; Kidney failure in her sister; Multiple sclerosis in her sister; Other in her brother; Pancreatic cancer in her sister; Seizures in her brother. There is no history of Breast cancer.  ROS:   Please see the history of present illness.    Chronic stable ankle edema.  All other systems reviewed and are negative.  EKGs/Labs/Other Studies Reviewed:    The following studies were reviewed today: No new cardiac imaging  EKG:  EKG not repeated  Recent Labs: 07/04/2018: BUN 19; Creatinine, Ser 1.03; Potassium 4.1; Sodium 142  Recent Lipid Panel No results found for: CHOL, TRIG, HDL, CHOLHDL, VLDL, LDLCALC, LDLDIRECT  Physical Exam:    VS:  BP 124/76   Pulse 81   Ht 5\' 1"  (1.549 m)   Wt 189 lb 12.8 oz (86.1 kg)   SpO2 96%   BMI 35.86 kg/m     Wt Readings from Last 3 Encounters:  04/17/19 189 lb 12.8 oz (86.1 kg)  01/29/19 191 lb (86.6 kg)  10/30/18 188 lb 6.4 oz (85.5 kg)     GEN: Compatible with age. No acute distress HEENT: Normal NECK: No JVD. LYMPHATICS: No lymphadenopathy CARDIAC: \ RRR without murmur, gallop, or edema. VASCULAR: Normal Pulses. No bruits. RESPIRATORY:  Clear to auscultation without rales, wheezing or rhonchi  ABDOMEN: Soft, non-tender, non-distended, No pulsatile mass,  MUSCULOSKELETAL: No deformity  SKIN: Warm and dry NEUROLOGIC:  Alert and oriented x 3 PSYCHIATRIC:  Normal affect   ASSESSMENT:    1. Chronic diastolic heart failure ([place])   2. Essential hypertension   3. Hypercholesteremia   4. Coronary aneurysm   5. PSVT (paroxysmal supraventricular tachycardia) ([place])   6. Chronic anticoagulation   7. Educated about COVID-19 virus infection    PLAN:    In order of problems listed above:  1. No evidence of volume overload based upon neck vein  exam and lung exam. 2. Excellent blood pressure control.  She is on spironolactone and recently had creatinine and potassium that were normal in September. 3. Lipid levels are stable with LDL of 87 in July. 4. Eliquis is being used because of suspected coronary Endo embolization from proximal aneurysm.  Monitor for bleeding. 5. No documentation of A. fib.  She does have PSVT and is therefore at risk for AF. 6. Eliquis without bleeding.  Current dose is adequate based upon age, weight, and kidney function. 7. The 3 W's is practiced to avoid Covid infection.  Clinical observation.  Twice per year potassium, creatinine, and hemoglobin.  Clinical follow-up with me in 9 to 12 months.  She has an appointment coming up with Dr. Cliffton Asters sometime in early 2021.   Medication Adjustments/Labs and Tests Ordered: Current medicines are reviewed at length with the patient today.  Concerns regarding medicines are outlined above.  No orders of the defined types were placed in this encounter.  No orders of the defined types were placed in this encounter.   Patient Instructions  Medication Instructions:  Your physician recommends that you continue on your current medications as directed. Please refer to the Current Medication list given to you today.  *If you need a refill on your cardiac medications before your next appointment, please call your pharmacy*  Lab Work: None If you have labs (blood work) drawn today and  your tests are completely normal, you will receive your results only by: Marland Kitchen MyChart Message (if you have MyChart) OR . A paper copy in the mail If you have any lab test that is abnormal or we need to change your treatment, we will call you to review the results.  Testing/Procedures: None  Follow-Up: At [facility], you and your health needs are our priority.  As part of our continuing mission to provide you with exceptional heart care, we have created designated Provider Care Teams.  These Care Teams include your primary Cardiologist (physician) and Advanced Practice Providers (APPs -  Physician Assistants and Nurse Practitioners) who all work together to provide you with the care you need, when you need it.  Your next appointment:   9-12 month(s)  The format for your next appointment:   In Person  Provider:   You may see Lesleigh Noe, MD or one of the following Advanced Practice Providers on your designated Care Team:    Norma Fredrickson, NP  Nada Boozer, NP  Georgie Chard, NP   Other Instructions      Signed, Lesleigh Noe, MD  04/17/2019 9:18 AM    [facility]

## 2019-04-17 ENCOUNTER — Encounter: Payer: Self-pay | Admitting: Interventional Cardiology

## 2019-04-17 ENCOUNTER — Other Ambulatory Visit: Payer: Self-pay

## 2019-04-17 ENCOUNTER — Ambulatory Visit: Payer: Medicare Other | Admitting: Interventional Cardiology

## 2019-04-17 VITALS — BP 124/76 | HR 81 | Ht 61.0 in | Wt 189.8 lb

## 2019-04-17 DIAGNOSIS — Z7189 Other specified counseling: Secondary | ICD-10-CM

## 2019-04-17 DIAGNOSIS — E7800 Pure hypercholesterolemia, unspecified: Secondary | ICD-10-CM

## 2019-04-17 DIAGNOSIS — I5032 Chronic diastolic (congestive) heart failure: Secondary | ICD-10-CM | POA: Diagnosis not present

## 2019-04-17 DIAGNOSIS — I2541 Coronary artery aneurysm: Secondary | ICD-10-CM

## 2019-04-17 DIAGNOSIS — I10 Essential (primary) hypertension: Secondary | ICD-10-CM

## 2019-04-17 DIAGNOSIS — I471 Supraventricular tachycardia: Secondary | ICD-10-CM

## 2019-04-17 DIAGNOSIS — Z7901 Long term (current) use of anticoagulants: Secondary | ICD-10-CM

## 2019-04-17 NOTE — Patient Instructions (Addendum)
Medication Instructions:  Your physician recommends that you continue on your current medications as directed. Please refer to the Current Medication list given to you today.  *If you need a refill on your cardiac medications before your next appointment, please call your pharmacy*  Lab Work: None If you have labs (blood work) drawn today and your tests are completely normal, you will receive your results only by: . MyChart Message (if you have MyChart) OR . A paper copy in the mail If you have any lab test that is abnormal or we need to change your treatment, we will call you to review the results.  Testing/Procedures: None  Follow-Up: At [facility], you and your health needs are our priority.  As part of our continuing mission to provide you with exceptional heart care, we have created designated Provider Care Teams.  These Care Teams include your primary Cardiologist (physician) and Advanced Practice Providers (APPs -  Physician Assistants and Nurse Practitioners) who all work together to provide you with the care you need, when you need it.  Your next appointment:   9-12 month(s)  The format for your next appointment:   In Person  Provider:   You may see Henry W Smith III, MD or one of the following Advanced Practice Providers on your designated Care Team:    Lori Gerhardt, NP  Laura Ingold, NP  Jill McDaniel, NP   Other Instructions   

## 2019-06-09 ENCOUNTER — Encounter: Payer: Self-pay | Admitting: Sports Medicine

## 2019-06-09 ENCOUNTER — Ambulatory Visit: Payer: Medicare Other | Admitting: Sports Medicine

## 2019-06-09 ENCOUNTER — Ambulatory Visit (INDEPENDENT_AMBULATORY_CARE_PROVIDER_SITE_OTHER): Payer: Medicare Other

## 2019-06-09 ENCOUNTER — Other Ambulatory Visit: Payer: Self-pay | Admitting: Sports Medicine

## 2019-06-09 ENCOUNTER — Other Ambulatory Visit: Payer: Self-pay

## 2019-06-09 DIAGNOSIS — M79675 Pain in left toe(s): Secondary | ICD-10-CM

## 2019-06-09 DIAGNOSIS — M722 Plantar fascial fibromatosis: Secondary | ICD-10-CM

## 2019-06-09 DIAGNOSIS — M2142 Flat foot [pes planus] (acquired), left foot: Secondary | ICD-10-CM

## 2019-06-09 DIAGNOSIS — M7732 Calcaneal spur, left foot: Secondary | ICD-10-CM

## 2019-06-09 DIAGNOSIS — M79672 Pain in left foot: Secondary | ICD-10-CM

## 2019-06-09 DIAGNOSIS — M21619 Bunion of unspecified foot: Secondary | ICD-10-CM

## 2019-06-09 DIAGNOSIS — L84 Corns and callosities: Secondary | ICD-10-CM

## 2019-06-09 DIAGNOSIS — M2141 Flat foot [pes planus] (acquired), right foot: Secondary | ICD-10-CM | POA: Diagnosis not present

## 2019-06-09 DIAGNOSIS — M2042 Other hammer toe(s) (acquired), left foot: Secondary | ICD-10-CM

## 2019-06-09 NOTE — Progress Notes (Signed)
Subjective: Lynn Brown is a 86 y.o. female patient who presents to office for evaluation of Left foot pain secondary to callus skin and new heel pain. Patient complains of pain at the lesion present left 2nd but spacer helps and a hew pain to her heel that sometimes that wakes her up at night. Patient denies any other pedal complaints.    Patient Active Problem List   Diagnosis Date Noted  . 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 NSTEMI (non-ST elevated myocardial infarction) ([place]) 03/09/2018  . Chronic diastolic heart failure ([place]) 09/25/2017  . Hypothyroid   . Essential hypertension   . Hypercholesteremia   . Osteopenia 04/27/2008    Current Outpatient Medications on File Prior to Visit  Medication Sig Dispense Refill  . amLODipine (NORVASC) 2.5 MG tablet Take 1 tablet (2.5 mg total) by mouth daily. 90 tablet 2  . apixaban (ELIQUIS) 5 MG TABS tablet Take 1 tablet (5 mg total) by mouth 2 (two) times daily. 180 tablet 1  . calcium citrate-vitamin D (CITRACAL+D) 315-200 MG-UNIT per tablet Take 1 tablet by mouth daily.     . Cholecalciferol (VITAMIN D) 2000 UNITS tablet Take 2,000 Units by mouth daily.      . Coenzyme Q10 (CO Q 10 PO) Take 1 tablet by mouth daily.    Marland Kitchen levothyroxine (SYNTHROID, LEVOTHROID) 125 MCG tablet Take 125 mcg by mouth daily before breakfast.   3  . magnesium oxide (MAG-OX) 400 MG tablet Take 400 mg by mouth daily.    . metoprolol tartrate (LOPRESSOR) 25 MG tablet Take 0.5 tablets (12.5 mg total) by mouth 2 (two) times daily. 60 tablet 0  . nitroGLYCERIN (NITROSTAT) 0.4 MG SL tablet Place 1 tablet (0.4 mg total) under the tongue every 5 (five) minutes as needed for chest pain. 30 tablet 0  . Omega-3 Fatty Acids (FISH OIL PO) Take 1 tablet by mouth daily.    . rosuvastatin (CRESTOR) 20 MG tablet Take 20 mg by mouth daily.    Marland Kitchen spironolactone (ALDACTONE) 25 MG tablet Take 1 tablet (25 mg total) by mouth daily. 90 tablet 2   No current facility-administered  medications on file prior to visit.    No Known Allergies  Objective:  General: Alert and oriented x3 in no acute distress  Dermatology: Keratotic lesion present medial 2nd toe on left with skin lines transversing the lesion, pain is present with direct pressure to the lesion with a central nucleated core noted, no webspace macerations, no ecchymosis bilateral, all nails x 10 are well manicured.  Vascular: Dorsalis Pedis and Posterior Tibial pedal pulses 1/4, Capillary Fill Time 3 seconds, scant pedal hair growth bilateral, trace edema bilateral lower extremities, Temperature gradient within normal limits.  Neurology: Johney Maine sensation intact via light touch bilateral.  Musculoskeletal: Mild tenderness with palpation at the keratotic lesion site on Left, minimal pain at left heel, Muscular strength 4/5 in all groups without pain or limitation on range of motion. Pes planus, Bunion, and hammetoe boney deformity noted.  Xrays pes planus with midtarsal breach and heel spur.   Assessment and Plan: Problem List Items Addressed This Visit    None    Visit Diagnoses    Pain of left heel    -  Primary   Relevant Orders   DG Foot Complete Left   Plantar fasciitis of left foot       Heel spur, left       Pes planus of both feet  Corns and callosities       Hammertoe of second toe of left foot       Bunion       Toe pain, left         -Complete examination performed -Discussed treatment options -Patient declined injection for left heel  -Advised good supportive shoes and inserts and spacers -Recommend and dispensed heel lifts bilateral  -Patient to return to office as needed or sooner if condition worsens.  Asencion Islam, DPM

## 2019-06-16 ENCOUNTER — Ambulatory Visit: Payer: Medicare Other | Admitting: Sports Medicine

## 2019-07-22 ENCOUNTER — Telehealth: Payer: Self-pay | Admitting: Interventional Cardiology

## 2019-07-22 DIAGNOSIS — I214 Non-ST elevation (NSTEMI) myocardial infarction: Secondary | ICD-10-CM

## 2019-07-22 NOTE — Telephone Encounter (Signed)
Dorothy, RN with [employer], states she is calling to request clearance from Dr. Katrinka Blazing in order for the patient to begin a cardiac rehab program. Nicole Cella states that a call may be returned to the patient to provide approval and if Dr. Katrinka Blazing has any questions in regards to the cardiac rehab program, she can be reached at [phone] (ext: [phone]).

## 2019-07-22 NOTE — Telephone Encounter (Signed)
Left message for Lynn Brown to call back.  Need to know if she is referring to a traditional cardiac rehab program like the one offered at [facility] or if it is some kind of [facility] program they have.

## 2019-07-23 NOTE — Telephone Encounter (Signed)
Yes

## 2019-07-23 NOTE — Telephone Encounter (Signed)
Order placed.  Attempted to contact pt.  No answer and no VM.

## 2019-07-23 NOTE — Telephone Encounter (Signed)
Spoke with Nicole Cella and she states pt is interested in participating in Cardiac Rehab to help with strengthening and helping with swelling and SOB.  She feels she is deconditioned.  Nicole Cella states pt should have a $0 co-pay.  Advised I will send to Dr. Katrinka Blazing to see if ok to send referral to cardiac rehab?

## 2019-07-24 ENCOUNTER — Other Ambulatory Visit: Payer: Self-pay | Admitting: *Deleted

## 2019-07-24 ENCOUNTER — Encounter (HOSPITAL_COMMUNITY): Payer: Self-pay | Admitting: *Deleted

## 2019-07-24 DIAGNOSIS — I5032 Chronic diastolic (congestive) heart failure: Secondary | ICD-10-CM

## 2019-07-24 NOTE — Telephone Encounter (Signed)
Spoke with pt earlier and made her aware of referral but that cardiac rehab had reached out about NSTEMI being over a year old, so insurance will not cover.  Advised I will call back with more info once we have it. Lynn Brown with Cardiac Rehab spoke with insurance company and no coverage was correct.  However, pt does qualify for pulmonary rehab under her CHF dx.  Order placed for pulmonary rehab.  Will route to Dr. Katrinka Blazing to make him aware of the change.    Spoke with pt and made her aware of the change as well.  Pt appreciative for call.

## 2019-07-24 NOTE — Progress Notes (Signed)
Received referral from Dr. Katrinka Blazing for this pt to participate in pulmonary rehab with the the diagnosis of diastolic heart failure.  Pt has EF 55-60% in 2019. Clinical review of pt follow up appt on 04/16/20 office note.  Pt with Covid Risk Score - 6. Pt appropriate for scheduling for Pulmonary rehab.  Will forward to support staff for verification of insurance eligibility/benefits and pulmonary staff for scheduling with pt consent. Alanson Aly, BSN Cardiac and Emergency planning/management officer

## 2019-07-25 ENCOUNTER — Other Ambulatory Visit: Payer: Self-pay | Admitting: Interventional Cardiology

## 2019-07-26 NOTE — Telephone Encounter (Signed)
I am okay with Pulmonary rehab. But, see if cardiac rehab possible with diastolic CHF diagnosis?

## 2019-07-27 ENCOUNTER — Telehealth (HOSPITAL_COMMUNITY): Payer: Self-pay

## 2019-07-27 NOTE — Telephone Encounter (Signed)
Pt last saw Dr Katrinka Blazing 04/17/19, last labs 06/29/19 Creat 0.92 at [facility] per [facility], Age 86, weight 86.1kg, based on specified criteria pt is on appropriate dosage of Eliquis 5mg  BID.  Will refill rx.

## 2019-07-27 NOTE — Telephone Encounter (Signed)
Message sent to [facility] with Cardiac Rehab to see if we could do that with CHF dx.  She states pt would not qualify d/t EF being 55-60%.  Advised we will leave the order at Pulmonary Rehab then.

## 2019-07-27 NOTE — Telephone Encounter (Signed)
Pt insurance is active and benefits verified through [facility] Medicare Co-pay $20, DED 0/0 met, out of pocket $3,600/$45 met, co-insurance 0. no pre-authorization required, REF# [id number]

## 2019-08-04 ENCOUNTER — Telehealth (HOSPITAL_COMMUNITY): Payer: Self-pay

## 2019-08-04 ENCOUNTER — Telehealth (HOSPITAL_COMMUNITY): Payer: Self-pay | Admitting: *Deleted

## 2019-08-04 NOTE — Telephone Encounter (Signed)
Called to schedule orientation/walk test for pulmonary rehab with patient.  We discussed coverage of benefits, description of program, directions, and proper attire. Scheduled for 3/22, 2021 @ 1030 am, patient will check with daughter as to which class to enroll in.

## 2019-08-17 ENCOUNTER — Encounter (HOSPITAL_COMMUNITY): Payer: Self-pay

## 2019-08-17 ENCOUNTER — Other Ambulatory Visit: Payer: Self-pay

## 2019-08-17 ENCOUNTER — Encounter (HOSPITAL_COMMUNITY)
Admission: RE | Admit: 2019-08-17 | Discharge: 2019-08-17 | Disposition: A | Discharge status: Home / Self Care | Payer: Medicare Other | Source: Ambulatory Visit | Attending: Interventional Cardiology | Admitting: Interventional Cardiology

## 2019-08-17 VITALS — BP 104/60 | HR 63 | Temp 95.2°F | Ht 61.25 in | Wt 191.8 lb

## 2019-08-17 DIAGNOSIS — I5032 Chronic diastolic (congestive) heart failure: Secondary | ICD-10-CM | POA: Insufficient documentation

## 2019-08-17 NOTE — Progress Notes (Signed)
Lynn Brown 86 y.o. female Pulmonary Rehab Orientation Note Patient arrived today in Cardiac and Pulmonary Rehab for orientation to Pulmonary Rehab. She was dropped off by her daughter at the [facility] entrance, she walked in using a cane for balance issues due to an arthritic right knee. She does not carry portable oxygen. Per pt, she uses oxygen never. Color good, skin warm and dry. Patient is oriented to time and place. Patient's medical history, psychosocial health, and medications reviewed. Psychosocial assessment reveals pt lives with their daughter. Pt is currently retired Pt reports her stress level is low.  Pt does not exhibit signs of depression. PHQ2/9 score 0/0. Pt shows good  coping skills with positive outlook . Will continue to monitor and evaluate for the presence of psychosocial barriers or concerns. Physical assessment reveals heart rate is normal, breath sounds clear to auscultation, no wheezes, rales, or rhonchi. Grip strength equal, strong. Distal pulses 2+ bilateral posterior tibial pulses present with 2+ ankle edema which patient states is very normal for her, with the left ankle larger than the right. Patient reports she does take medications as prescribed. Patient states she follows a Low Sodium diet. The patient reports no specific efforts to gain or lose weight.. Patient's weight will be monitored closely. Demonstration and practice of PLB using pulse oximeter. Patient able to return demonstration satisfactorily. Safety and hand hygiene in the exercise area reviewed with patient. Patient voices understanding of the information reviewed. Department expectations discussed with patient and achievable goals were set. The patient shows enthusiasm about attending the program and we look forward to working with this nice lady. The patient completed a 6 min walk test today, 08/17/2019 and to begin exercise on Tuesday, August 25, 2019 in the 1000 exercise slot.  [id number]

## 2019-08-18 NOTE — Progress Notes (Signed)
Pulmonary Individual Treatment Plan  Patient Details  Name: Lynn Brown MRN: [id number] Date of Birth: 11/19/1931 Referring Provider:     Pulmonary Rehab Walk Test from 08/17/2019 in [facility]  Referring Provider  Dr. Katrinka Blazing      Initial Encounter Date:    Pulmonary Rehab Walk Test from 08/17/2019 in [facility] CARD[facility]HAB  Date  08/18/19      Visit Diagnosis: Heart failure, diastolic, chronic (HCC)  Patient's Home Medications on Admission:   Current Outpatient Medications:  .  amLODipine (NORVASC) 2.5 MG tablet, Take 1 tablet (2.5 mg total) by mouth daily., Disp: 90 tablet, Rfl: 2 .  calcium citrate-vitamin D (CITRACAL+D) 315-200 MG-UNIT per tablet, Take 1 tablet by mouth daily. , Disp: , Rfl:  .  Cholecalciferol (VITAMIN D) 2000 UNITS tablet, Take 2,000 Units by mouth daily.  , Disp: , Rfl:  .  Coenzyme Q10 (CO Q 10 PO), Take 1 tablet by mouth daily., Disp: , Rfl:  .  ELIQUIS 5 MG TABS tablet, TAKE 1 TABLET BY MOUTH  TWICE DAILY, Disp: 180 tablet, Rfl: 1 .  levothyroxine (SYNTHROID, LEVOTHROID) 125 MCG tablet, Take 125 mcg by mouth daily before breakfast. , Disp: , Rfl: 3 .  magnesium oxide (MAG-OX) 400 MG tablet, Take 400 mg by mouth daily., Disp: , Rfl:  .  metoprolol tartrate (LOPRESSOR) 25 MG tablet, Take 0.5 tablets (12.5 mg total) by mouth 2 (two) times daily., Disp: 60 tablet, Rfl: 0 .  nitroGLYCERIN (NITROSTAT) 0.4 MG SL tablet, Place 1 tablet (0.4 mg total) under the tongue every 5 (five) minutes as needed for chest pain., Disp: 30 tablet, Rfl: 0 .  Omega-3 Fatty Acids (FISH OIL PO), Take 1 tablet by mouth daily., Disp: , Rfl:  .  rosuvastatin (CRESTOR) 20 MG tablet, Take 20 mg by mouth daily., Disp: , Rfl:  .  spironolactone (ALDACTONE) 25 MG tablet, Take 1 tablet (25 mg total) by mouth daily., Disp: 90 tablet, Rfl: 2  Past Medical History: Past Medical History:  Diagnosis Date  . Allergic rhinitis   . Anxiety    . Bunion    PES PLANUS  . Cervical disc disease   . Hearing loss    HEARING AIDS, BUT DOES NOT WEAR THEM CONSISTENTLY.   Marland Kitchen Hypercholesteremia   . Hypertension   . Hypothyroid   . Leaky heart valve    DR. HARWANI  . Mild obesity   . Osteoarthritis of knees, bilateral   . Osteoporosis 12/2017   T score -3.3  . Venous insufficiency     Tobacco Use: Social History   Tobacco Use  Smoking Status Never Smoker  Smokeless Tobacco Never Used    Labs: Recent Review Flowsheet Data    There is no flowsheet data to display.      Capillary Blood Glucose: No results found for: GLUCAP   Pulmonary Assessment Scores: Pulmonary Assessment Scores    Row Name 08/17/19 1119 08/18/19 0924       ADL UCSD   ADL Phase  Entry  Entry    SOB Score total  52  --      CAT Score   CAT Score  17  --      mMRC Score   mMRC Score  --  3      UCSD: Self-administered rating of dyspnea associated with activities of daily living (ADLs) 6-point scale (0 = "not at all" to 5 = "maximal or unable to  do because of breathlessness")  Scoring Scores range from 0 to 120.  Minimally important difference is 5 units  CAT: CAT can identify the health impairment of COPD patients and is better correlated with disease progression.  CAT has a scoring range of zero to 40. The CAT score is classified into four groups of low (less than 10), medium (10 - 20), high (21-30) and very high (31-40) based on the impact level of disease on health status. A CAT score over 10 suggests significant symptoms.  A worsening CAT score could be explained by an exacerbation, poor medication adherence, poor inhaler technique, or progression of COPD or comorbid conditions.  CAT MCID is 2 points  mMRC: mMRC (Modified Medical Research Council) Dyspnea Scale is used to assess the degree of baseline functional disability in patients of respiratory disease due to dyspnea. No minimal important difference is established. A decrease in  score of 1 point or greater is considered a positive change.   Pulmonary Function Assessment: Pulmonary Function Assessment - 08/17/19 1119      Breath   Bilateral Breath Sounds  Clear    Shortness of Breath  Yes;Limiting activity       Exercise Target Goals: Exercise Program Goal: Individual exercise prescription set using results from initial 6 min walk test and THRR while considering  patient's activity barriers and safety.   Exercise Prescription Goal: Initial exercise prescription builds to 30-45 minutes a day of aerobic activity, 2-3 days per week.  Home exercise guidelines will be given to patient during program as part of exercise prescription that the participant will acknowledge.  Activity Barriers & Risk Stratification: Activity Barriers & Cardiac Risk Stratification - 08/17/19 1118      Activity Barriers & Cardiac Risk Stratification   Activity Barriers  Arthritis;Joint Problems;Deconditioning;Muscular Weakness;Shortness of Breath;Balance Concerns;Assistive Device       6 Minute Walk: 6 Minute Walk    Row Name 08/17/19 1156         6 Minute Walk   Phase  Initial     Distance  737 feet     Walk Time  6 minutes     # of Rest Breaks  0     MPH  1.4     METS  0.45 outlier in ACSM 6MWT equation due to high BMI, low distance, low HR, and low BP.     RPE  11     Perceived Dyspnea   0     VO2 Peak  1.57     Symptoms  Yes (comment)     Comments  pushed wheel chair     Resting HR  54 bpm     Resting BP  104/60     Resting Oxygen Saturation   95 %     Exercise Oxygen Saturation  during 6 min walk  91 %     Max Ex. HR  86 bpm     Max Ex. BP  132/70     2 Minute Post BP  110/66       Interval HR   1 Minute HR  57     2 Minute HR  64     3 Minute HR  67     4 Minute HR  67     5 Minute HR  68     6 Minute HR  86     2 Minute Post HR  79     Interval Heart Rate?  Yes  Interval Oxygen   Interval Oxygen?  Yes     Baseline Oxygen Saturation %  95 %      1 Minute Oxygen Saturation %  96 %     1 Minute Liters of Oxygen  0 L     2 Minute Oxygen Saturation %  91 %     2 Minute Liters of Oxygen  0 L     3 Minute Oxygen Saturation %  93 %     3 Minute Liters of Oxygen  0 L     4 Minute Oxygen Saturation %  91 %     4 Minute Liters of Oxygen  0 L     5 Minute Oxygen Saturation %  92 %     5 Minute Liters of Oxygen  0 L     6 Minute Oxygen Saturation %  93 %     6 Minute Liters of Oxygen  0 L     2 Minute Post Oxygen Saturation %  97 %     2 Minute Post Liters of Oxygen  0 L        Oxygen Initial Assessment: Oxygen Initial Assessment - 08/18/19 0924      Home Oxygen   Home Oxygen Device  None    Sleep Oxygen Prescription  None    Home Exercise Oxygen Prescription  None    Home at Rest Exercise Oxygen Prescription  None    Compliance with Home Oxygen Use  Yes      Initial 6 min Walk   Oxygen Used  None      Program Oxygen Prescription   Program Oxygen Prescription  None      Intervention   Short Term Goals  To learn and exhibit compliance with exercise, home and travel O2 prescription;To learn and understand importance of monitoring SPO2 with pulse oximeter and demonstrate accurate use of the pulse oximeter.;To learn and understand importance of maintaining oxygen saturations>88%;To learn and demonstrate proper pursed lip breathing techniques or other breathing techniques.;To learn and demonstrate proper use of respiratory medications    Long  Term Goals  Exhibits compliance with exercise, home and travel O2 prescription;Verbalizes importance of monitoring SPO2 with pulse oximeter and return demonstration;Maintenance of O2 saturations>88%;Exhibits proper breathing techniques, such as pursed lip breathing or other method taught during program session;Compliance with respiratory medication;Demonstrates proper use of MDI's       Oxygen Re-Evaluation:   Oxygen Discharge (Final Oxygen Re-Evaluation):   Initial Exercise  Prescription: Initial Exercise Prescription - 08/18/19 0900      Date of Initial Exercise RX and Referring Provider   Date  08/18/19    Referring Provider  Dr. Katrinka Blazing      NuStep   Level  2    SPM  80    Minutes  15      Arm Ergometer   Level  1    Minutes  15      Prescription Details   Frequency (times per week)  2    Duration  Progress to 30 minutes of continuous aerobic without signs/symptoms of physical distress      Intensity   THRR 40-80% of Max Heartrate  54-108    Ratings of Perceived Exertion  11-13    Perceived Dyspnea  0-4      Progression   Progression  Continue progressive overload as per policy without signs/symptoms or physical distress.      Paramedic Prescription  Yes    Weight  orange bands    Reps  10-15       Perform Capillary Blood Glucose checks as needed.  Exercise Prescription Changes:   Exercise Comments:   Exercise Goals and Review: Exercise Goals    Row Name 08/18/19 0927             Exercise Goals   Increase Physical Activity  Yes       Intervention  Provide advice, education, support and counseling about physical activity/exercise needs.;Develop an individualized exercise prescription for aerobic and resistive training based on initial evaluation findings, risk stratification, comorbidities and participant's personal goals.       Expected Outcomes  Short Term: Attend rehab on a regular basis to increase amount of physical activity.;Long Term: Add in home exercise to make exercise part of routine and to increase amount of physical activity.;Long Term: Exercising regularly at least 3-5 days a week.       Increase Strength and Stamina  Yes       Intervention  Provide advice, education, support and counseling about physical activity/exercise needs.;Develop an individualized exercise prescription for aerobic and resistive training based on initial evaluation findings, risk stratification, comorbidities and  participant's personal goals.       Expected Outcomes  Short Term: Increase workloads from initial exercise prescription for resistance, speed, and METs.;Short Term: Perform resistance training exercises routinely during rehab and add in resistance training at home;Long Term: Improve cardiorespiratory fitness, muscular endurance and strength as measured by increased METs and functional capacity ( )       Able to understand and use rate of perceived exertion (RPE) scale  Yes       Intervention  Provide education and explanation on how to use RPE scale       Expected Outcomes  Long Term:  Able to use RPE to guide intensity level when exercising independently;Short Term: Able to use RPE daily in rehab to express subjective intensity level       Able to understand and use Dyspnea scale  Yes       Intervention  Provide education and explanation on how to use Dyspnea scale       Expected Outcomes  Short Term: Able to use Dyspnea scale daily in rehab to express subjective sense of shortness of breath during exertion;Long Term: Able to use Dyspnea scale to guide intensity level when exercising independently       Knowledge and understanding of Target Heart Rate Range (THRR)  Yes       Intervention  Provide education and explanation of THRR including how the numbers were predicted and where they are located for reference       Expected Outcomes  Short Term: Able to state/look up THRR;Short Term: Able to use daily as guideline for intensity in rehab;Long Term: Able to use THRR to govern intensity when exercising independently       Understanding of Exercise Prescription  Yes       Intervention  Provide education, explanation, and written materials on patient's individual exercise prescription       Expected Outcomes  Short Term: Able to explain program exercise prescription;Long Term: Able to explain home exercise prescription to exercise independently          Exercise Goals Re-Evaluation :   Discharge  Exercise Prescription (Final Exercise Prescription Changes):   Nutrition:  Target Goals: Understanding of nutrition guidelines, daily intake of sodium 1500mg , cholesterol 200mg , calories 30% from fat and 7%  or less from saturated fats, daily to have 5 or more servings of fruits and vegetables.  Biometrics: Pre Biometrics - 08/17/19 1118      Pre Biometrics   Height  5' 1.25" (1.556 m)    Weight  87 kg    BMI (Calculated)  35.93    Grip Strength  22 kg        Nutrition Therapy Plan and Nutrition Goals:   Nutrition Assessments:   Nutrition Goals Re-Evaluation:   Nutrition Goals Discharge (Final Nutrition Goals Re-Evaluation):   Psychosocial: Target Goals: Acknowledge presence or absence of significant depression and/or stress, maximize coping skills, provide positive support system. Participant is able to verbalize types and ability to use techniques and skills needed for reducing stress and depression.  Initial Review & Psychosocial Screening: Initial Psych Review & Screening - 08/17/19 1125      Initial Review   Current issues with  None Identified      Family Dynamics   Good Support System?  Yes   daughter lives with her     Barriers   Psychosocial barriers to participate in program  There are no identifiable barriers or psychosocial needs.      Screening Interventions   Interventions  Encouraged to exercise       Quality of Life Scores:  Scores of 19 and below usually indicate a poorer quality of life in these areas.  A difference of  2-3 points is a clinically meaningful difference.  A difference of 2-3 points in the total score of the Quality of Life Index has been associated with significant improvement in overall quality of life, self-image, physical symptoms, and general health in studies assessing change in quality of life.  PHQ-9: Recent Review Flowsheet Data    Depression screen [facility] 2/9 08/17/2019   Decreased Interest 0   Down, Depressed, Hopeless  0   PHQ - 2 Score 0   Altered sleeping 0   Tired, decreased energy 0   Change in appetite 0   Feeling bad or failure about yourself  0   Trouble concentrating 0   Moving slowly or fidgety/restless 0   Suicidal thoughts 0   PHQ-9 Score 0   Difficult doing work/chores Not difficult at all     Interpretation of Total Score  Total Score Depression Severity:  1-4 = Minimal depression, 5-9 = Mild depression, 10-14 = Moderate depression, 15-19 = Moderately severe depression, 20-27 = Severe depression   Psychosocial Evaluation and Intervention: Psychosocial Evaluation - 08/17/19 1126      Psychosocial Evaluation & Interventions   Interventions  Encouraged to exercise with the program and follow exercise prescription    Comments  no psychosocial concerns at this time    Expected Outcomes  will continue to have no psychosocial concerns while in the program    Continue Psychosocial Services   No Follow up required       Psychosocial Re-Evaluation:   Psychosocial Discharge (Final Psychosocial Re-Evaluation):   Education: Education Goals: Education classes will be provided on a weekly basis, covering required topics. Participant will state understanding/return demonstration of topics presented.  Learning Barriers/Preferences: Learning Barriers/Preferences - 08/17/19 1128      Learning Barriers/Preferences   Learning Barriers  None    Learning Preferences  Written Material;Skilled Demonstration;Verbal Instruction;Pictoral;Individual Instruction;Audio;Video       Education Topics: Risk Factor Reduction:  -Group instruction that is supported by a PowerPoint presentation. Instructor discusses the definition of a risk factor, different risk factors for  pulmonary disease, and how the heart and lungs work together.     Nutrition for Pulmonary Patient:  -Group instruction provided by PowerPoint slides, verbal discussion, and written materials to support subject matter. The instructor  gives an explanation and review of healthy diet recommendations, which includes a discussion on weight management, recommendations for fruit and vegetable consumption, as well as protein, fluid, caffeine, fiber, sodium, sugar, and alcohol. Tips for eating when patients are short of breath are discussed.   Pursed Lip Breathing:  -Group instruction that is supported by demonstration and informational handouts. Instructor discusses the benefits of pursed lip and diaphragmatic breathing and detailed demonstration on how to preform both.     Oxygen Safety:  -Group instruction provided by PowerPoint, verbal discussion, and written material to support subject matter. There is an overview of "What is Oxygen" and "Why do we need it".  Instructor also reviews how to create a safe environment for oxygen use, the importance of using oxygen as prescribed, and the risks of noncompliance. There is a brief discussion on traveling with oxygen and resources the patient may utilize.   Oxygen Equipment:  -Group instruction provided by [facility] Staff utilizing handouts, written materials, and equipment demonstrations.   Signs and Symptoms:  -Group instruction provided by written material and verbal discussion to support subject matter. Warning signs and symptoms of infection, stroke, and heart attack are reviewed and when to call the physician/911 reinforced. Tips for preventing the spread of infection discussed.   Advanced Directives:  -Group instruction provided by verbal instruction and written material to support subject matter. Instructor reviews Advanced Directive laws and proper instruction for filling out document.   Pulmonary Video:  -Group video education that reviews the importance of medication and oxygen compliance, exercise, good nutrition, pulmonary hygiene, and pursed lip and diaphragmatic breathing for the pulmonary patient.   Exercise for the Pulmonary Patient:  -Group instruction that is  supported by a PowerPoint presentation. Instructor discusses benefits of exercise, core components of exercise, frequency, duration, and intensity of an exercise routine, importance of utilizing pulse oximetry during exercise, safety while exercising, and options of places to exercise outside of rehab.     Pulmonary Medications:  -Verbally interactive group education provided by instructor with focus on inhaled medications and proper administration.   Anatomy and Physiology of the Respiratory System and Intimacy:  -Group instruction provided by PowerPoint, verbal discussion, and written material to support subject matter. Instructor reviews respiratory cycle and anatomical components of the respiratory system and their functions. Instructor also reviews differences in obstructive and restrictive respiratory diseases with examples of each. Intimacy, Sex, and Sexuality differences are reviewed with a discussion on how relationships can change when diagnosed with pulmonary disease. Common sexual concerns are reviewed.   MD DAY -A group question and answer session with a medical doctor that allows participants to ask questions that relate to their pulmonary disease state.   OTHER EDUCATION -Group or individual verbal, written, or video instructions that support the educational goals of the pulmonary rehab program.   Holiday Eating Survival Tips:  -Group instruction provided by PowerPoint slides, verbal discussion, and written materials to support subject matter. The instructor gives patients tips, tricks, and techniques to help them not only survive but enjoy the holidays despite the onslaught of food that accompanies the holidays.   Knowledge Questionnaire Score: Knowledge Questionnaire Score - 08/17/19 1129      Knowledge Questionnaire Score   Pre Score  NA Heart failure  Core Components/Risk Factors/Patient Goals at Admission: Personal Goals and Risk Factors at Admission -  08/17/19 1141      Core Components/Risk Factors/Patient Goals on Admission   Heart Failure  Yes    Intervention  Provide a combined exercise and nutrition program that is supplemented with education, support and counseling about heart failure. Directed toward relieving symptoms such as shortness of breath, decreased exercise tolerance, and extremity edema.    Expected Outcomes  Short term: Attendance in program 2-3 days a week with increased exercise capacity. Reported lower sodium intake. Reported increased fruit and vegetable intake. Reports medication compliance.;Short term: Daily weights obtained and reported for increase. Utilizing diuretic protocols set by physician.;Long term: Adoption of self-care skills and reduction of barriers for early signs and symptoms recognition and intervention leading to self-care maintenance.       Core Components/Risk Factors/Patient Goals Review:  Goals and Risk Factor Review    Row Name 08/17/19 1142             Core Components/Risk Factors/Patient Goals Review   Personal Goals Review  Heart Failure          Core Components/Risk Factors/Patient Goals at Discharge (Final Review):  Goals and Risk Factor Review - 08/17/19 1142      Core Components/Risk Factors/Patient Goals Review   Personal Goals Review  Heart Failure       ITP Comments:   Comments:

## 2019-08-25 ENCOUNTER — Encounter (HOSPITAL_COMMUNITY)
Admission: RE | Admit: 2019-08-25 | Discharge: 2019-08-25 | Disposition: A | Discharge status: Home / Self Care | Payer: Medicare Other | Source: Ambulatory Visit | Attending: Interventional Cardiology | Admitting: Interventional Cardiology

## 2019-08-25 ENCOUNTER — Other Ambulatory Visit: Payer: Self-pay

## 2019-08-25 DIAGNOSIS — I5032 Chronic diastolic (congestive) heart failure: Secondary | ICD-10-CM | POA: Diagnosis not present

## 2019-08-25 NOTE — Progress Notes (Signed)
Daily Session Note  Patient Details  Name: Lynn Brown MRN: [id number] Date of Birth: 1932/03/31 Referring Provider:     Pulmonary Rehab Walk Test from 08/17/2019 in [place]  Referring Provider  Dr. Tamala Julian      Encounter Date: 08/25/2019  Check In: Session Check In - 08/25/19 1121      Check-In   Supervising physician immediately available to respond to emergencies  [facility] immediately available    Physician(s)  Dr. Sharlet Salina    Location  [facility]    Staff Present  Rosebud Poles, RN, Bjorn Loser, MS, Exercise Physiologist;River Mckercher Ysidro Evert, RN    Virtual Visit  No    Medication changes reported      No    Fall or balance concerns reported     No    Tobacco Cessation  No Change    Warm-up and Cool-down  Performed as group-led instruction    Resistance Training Performed  Yes    VAD Patient?  No    PAD/SET Patient?  No      Pain Assessment   Currently in Pain?  No/denies    Multiple Pain Sites  No       Capillary Blood Glucose: No results found for this or any previous visit (from the past 24 hour(s)).    Social History   Tobacco Use  Smoking Status Never Smoker  Smokeless Tobacco Never Used    Goals Met:  Exercise tolerated well No report of cardiac concerns or symptoms Strength training completed today  Goals Unmet:  Not Applicable  Comments: Service time is from 0955 to 1054    Dr. Fransico Him is Medical Director for Cardiac Rehab at [facility].

## 2019-08-26 ENCOUNTER — Other Ambulatory Visit: Payer: Self-pay

## 2019-08-26 MED ORDER — APIXABAN 5 MG PO TABS: 5.0000 mg | ORAL_TABLET | Freq: Two times a day (BID) | ORAL | 1 refills | Status: DC

## 2019-08-26 NOTE — Telephone Encounter (Signed)
Prescription refill request for Eliquis received.  Last office visit: Lynn Brown 04/17/2019 Scr: 1.03, 07/04/2018 Age: 86 y.o. Weight: 87 kg   Prescription refill sent.

## 2019-08-26 NOTE — Telephone Encounter (Signed)
Pt requesting refill sent to [facility]

## 2019-08-27 ENCOUNTER — Other Ambulatory Visit: Payer: Self-pay

## 2019-08-27 ENCOUNTER — Encounter (HOSPITAL_COMMUNITY)
Admission: RE | Admit: 2019-08-27 | Discharge: 2019-08-27 | Disposition: A | Discharge status: Home / Self Care | Payer: Medicare Other | Source: Ambulatory Visit | Attending: Interventional Cardiology | Admitting: Interventional Cardiology

## 2019-08-27 DIAGNOSIS — I5032 Chronic diastolic (congestive) heart failure: Secondary | ICD-10-CM | POA: Diagnosis present

## 2019-08-27 NOTE — Progress Notes (Signed)
Daily Session Note  Patient Details  Name: Lynn Brown MRN: [id number] Date of Birth: [birth year] Referring Provider:     Pulmonary Rehab Walk Test from 08/17/2019 in [place]  Referring Provider  Dr. Tamala Julian      Encounter Date: 08/27/2019  Check In: Session Check In - 08/27/19 0957      Check-In   Supervising physician immediately available to respond to emergencies  [facility] immediately available    Physician(s)  Dr. Loleta Books    Location  [facility]    Staff Present  Rosebud Poles, RN, BSN;Tylar Amborn Ysidro Evert, RN;Dalton Kris Mouton, MS, Exercise Physiologist    Virtual Visit  No    Medication changes reported      No    Fall or balance concerns reported     No    Tobacco Cessation  No Change    Warm-up and Cool-down  Performed as group-led instruction    Resistance Training Performed  Yes    VAD Patient?  No    PAD/SET Patient?  No      Pain Assessment   Currently in Pain?  No/denies    Multiple Pain Sites  No       Capillary Blood Glucose: No results found for this or any previous visit (from the past 24 hour(s)).    Social History   Tobacco Use  Smoking Status Never Smoker  Smokeless Tobacco Never Used    Goals Met:  Exercise tolerated well No report of cardiac concerns or symptoms Strength training completed today  Goals Unmet:  Not Applicable  Comments: Service time is from 0950 to 1102    Dr. Fransico Him is Medical Director for Cardiac Rehab at [facility].

## 2019-09-01 ENCOUNTER — Other Ambulatory Visit: Payer: Self-pay

## 2019-09-01 ENCOUNTER — Encounter (HOSPITAL_COMMUNITY)
Admission: RE | Admit: 2019-09-01 | Discharge: 2019-09-01 | Disposition: A | Discharge status: Home / Self Care | Payer: Medicare Other | Source: Ambulatory Visit | Attending: Interventional Cardiology | Admitting: Interventional Cardiology

## 2019-09-01 VITALS — Ht 61.25 in | Wt 191.0 lb

## 2019-09-01 DIAGNOSIS — I5032 Chronic diastolic (congestive) heart failure: Secondary | ICD-10-CM | POA: Diagnosis not present

## 2019-09-01 NOTE — Progress Notes (Signed)
Denita Lun 86 y.o. female Nutrition Note   Visit Diagnosis: Heart failure, diastolic, chronic (HCC)  Past Medical History:  Diagnosis Date  . Allergic rhinitis   . Anxiety   . Bunion    PES PLANUS  . Cervical disc disease   . Hearing loss    HEARING AIDS, BUT DOES NOT WEAR THEM CONSISTENTLY.   Marland Kitchen Hypercholesteremia   . Hypertension   . Hypothyroid   . Leaky heart valve    DR. HARWANI  . Mild obesity   . Osteoarthritis of knees, bilateral   . Osteoporosis 12/2017   T score -3.3  . Venous insufficiency      Medications reviewed.   Current Outpatient Medications:  .  amLODipine (NORVASC) 2.5 MG tablet, Take 1 tablet (2.5 mg total) by mouth daily., Disp: 90 tablet, Rfl: 2 .  apixaban (ELIQUIS) 5 MG TABS tablet, Take 1 tablet (5 mg total) by mouth 2 (two) times daily., Disp: 180 tablet, Rfl: 1 .  calcium citrate-vitamin D (CITRACAL+D) 315-200 MG-UNIT per tablet, Take 1 tablet by mouth daily. , Disp: , Rfl:  .  Cholecalciferol (VITAMIN D) 2000 UNITS tablet, Take 2,000 Units by mouth daily.  , Disp: , Rfl:  .  Coenzyme Q10 (CO Q 10 PO), Take 1 tablet by mouth daily., Disp: , Rfl:  .  levothyroxine (SYNTHROID, LEVOTHROID) 125 MCG tablet, Take 125 mcg by mouth daily before breakfast. , Disp: , Rfl: 3 .  magnesium oxide (MAG-OX) 400 MG tablet, Take 400 mg by mouth daily., Disp: , Rfl:  .  metoprolol tartrate (LOPRESSOR) 25 MG tablet, Take 0.5 tablets (12.5 mg total) by mouth 2 (two) times daily., Disp: 60 tablet, Rfl: 0 .  nitroGLYCERIN (NITROSTAT) 0.4 MG SL tablet, Place 1 tablet (0.4 mg total) under the tongue every 5 (five) minutes as needed for chest pain., Disp: 30 tablet, Rfl: 0 .  Omega-3 Fatty Acids (FISH OIL PO), Take 1 tablet by mouth daily., Disp: , Rfl:  .  rosuvastatin (CRESTOR) 20 MG tablet, Take 20 mg by mouth daily., Disp: , Rfl:  .  spironolactone (ALDACTONE) 25 MG tablet, Take 1 tablet (25 mg total) by mouth daily., Disp: 90 tablet, Rfl: 2   Ht Readings from  Last 1 Encounters:  08/17/19 5' 1.25" (1.556 m)     Wt Readings from Last 3 Encounters:  08/17/19 191 lb 12.8 oz (87 kg)  04/17/19 189 lb 12.8 oz (86.1 kg)  01/29/19 191 lb (86.6 kg)     There is no height or weight on file to calculate BMI.   Social History   Tobacco Use  Smoking Status Never Smoker  Smokeless Tobacco Never Used     Nutrition Note  Spoke with pt. Nutrition Plan and Nutrition Survey goals reviewed with pt. Pt has been following a healthy diet. She limits sodium, chooses lean proteins, whole grains, and fruits/veggies daily.  She does report very little physical activity and feels this is the reason she has gained some weight. She states she would like to lose weight. Reviewed barriers to exercise and ways to improve.   Pt with dx of CHF. Per discussion, pt does not use canned/convenience foods often. Pt does not add salt to food. Pt does not eat out frequently.   Pt expressed understanding of the information reviewed.  Nutrition Diagnosis ? Obese  II = 35-39.9 related to physical inactivity as evidenced by a BMI 35.80 kg/m2  Nutrition Intervention ? Pt's individual nutrition plan reviewed with pt. ?  Benefits of adopting healthy diet reviewed with Rate My Plate survey ? Continue client-centered nutrition education by RD, as part of interdisciplinary care.  Goal(s) ? Pt to build a healthy plate including vegetables, fruits, whole grains, and low-fat dairy products in a heart healthy meal plan.  Plan:   Will provide client-centered nutrition education as part of interdisciplinary care  Monitor and evaluate progress toward nutrition goal with team.   Michaele Offer, MS, RDN, LDN

## 2019-09-01 NOTE — Progress Notes (Signed)
Daily Session Note  Patient Details  Name: Lynn Brown MRN: [id number] Date of Birth: 05/15/1932 Referring Provider:     Pulmonary Rehab Walk Test from 08/17/2019 in [place]  Referring Provider  Dr. Tamala Julian      Encounter Date: 09/01/2019  Check In: Session Check In - 09/01/19 1117      Check-In   Supervising physician immediately available to respond to emergencies  [facility] immediately available    Physician(s)  Dr. Broadus John    Location  [facility]    Staff Present  Rosebud Poles, RN, Bjorn Loser, MS, Exercise Physiologist;Orah Sonnen Ysidro Evert, RN    Virtual Visit  No    Medication changes reported      No    Fall or balance concerns reported     No    Tobacco Cessation  No Change    Warm-up and Cool-down  Performed as group-led instruction    Resistance Training Performed  Yes    VAD Patient?  No    PAD/SET Patient?  No      Pain Assessment   Currently in Pain?  No/denies    Multiple Pain Sites  No       Capillary Blood Glucose: No results found for this or any previous visit (from the past 24 hour(s)).  Exercise Prescription Changes - 09/01/19 1100      Response to Exercise   Blood Pressure (Admit)  126/76    Blood Pressure (Exercise)  114/60    Blood Pressure (Exit)  100/60    Heart Rate (Admit)  78 bpm    Heart Rate (Exercise)  89 bpm    Heart Rate (Exit)  84 bpm    Oxygen Saturation (Admit)  96 %    Oxygen Saturation (Exercise)  97 %    Oxygen Saturation (Exit)  98 %    Rating of Perceived Exertion (Exercise)  11    Perceived Dyspnea (Exercise)  1    Duration  Continue with 30 min of aerobic exercise without signs/symptoms of physical distress.    Intensity  THRR unchanged      Progression   Progression  Continue to progress workloads to maintain intensity without signs/symptoms of physical distress.      Resistance Training   Training Prescription  Yes    Weight  orange bands    Reps  10-15     Time  10 Minutes      NuStep   Level  2    SPM  80    Minutes  15    METs  1.8      Arm Ergometer   Level  1    Minutes  15       Social History   Tobacco Use  Smoking Status Never Smoker  Smokeless Tobacco Never Used    Goals Met:  Exercise tolerated well No report of cardiac concerns or symptoms Strength training completed today  Goals Unmet:  Not Applicable  Comments: Service time is from 0955 to 1100    Dr. Fransico Him is Medical Director for Cardiac Rehab at [facility].

## 2019-09-03 ENCOUNTER — Other Ambulatory Visit: Payer: Self-pay

## 2019-09-03 ENCOUNTER — Encounter (HOSPITAL_COMMUNITY)
Admission: RE | Admit: 2019-09-03 | Discharge: 2019-09-03 | Disposition: A | Discharge status: Home / Self Care | Payer: Medicare Other | Source: Ambulatory Visit | Attending: Interventional Cardiology | Admitting: Interventional Cardiology

## 2019-09-03 DIAGNOSIS — I5032 Chronic diastolic (congestive) heart failure: Secondary | ICD-10-CM

## 2019-09-03 NOTE — Progress Notes (Signed)
Daily Session Note  Patient Details  Name: Lynn Brown MRN: [id number] Date of Birth: Jun 24, 1931 Referring Provider:     Pulmonary Rehab Walk Test from 08/17/2019 in [place]  Referring Provider  Dr. Tamala Julian      Encounter Date: 09/03/2019  Check In: Session Check In - 09/03/19 1134      Check-In   Supervising physician immediately available to respond to emergencies  [facility] immediately available    Physician(s)  Dr. Broadus John    Location  [facility]    Staff Present  Rosebud Poles, RN, Bjorn Loser, MS, Exercise Physiologist;Elba Dendinger Ysidro Evert, RN    Virtual Visit  No    Medication changes reported      No    Fall or balance concerns reported     No    Tobacco Cessation  No Change    Warm-up and Cool-down  Performed on first and last piece of equipment    Resistance Training Performed  Yes    VAD Patient?  No    PAD/SET Patient?  No      Pain Assessment   Currently in Pain?  No/denies    Multiple Pain Sites  No       Capillary Blood Glucose: No results found for this or any previous visit (from the past 24 hour(s)).    Social History   Tobacco Use  Smoking Status Never Smoker  Smokeless Tobacco Never Used    Goals Met:  Exercise tolerated well No report of cardiac concerns or symptoms Strength training completed today  Goals Unmet:  Not Applicable  Comments: Service time is from 0955 to 1050    Dr. Fransico Him is Medical Director for Cardiac Rehab at [facility]

## 2019-09-08 ENCOUNTER — Other Ambulatory Visit: Payer: Self-pay

## 2019-09-08 ENCOUNTER — Encounter (HOSPITAL_COMMUNITY)
Admission: RE | Admit: 2019-09-08 | Discharge: 2019-09-08 | Disposition: A | Discharge status: Home / Self Care | Payer: Medicare Other | Source: Ambulatory Visit | Attending: Interventional Cardiology | Admitting: Interventional Cardiology

## 2019-09-08 DIAGNOSIS — I5032 Chronic diastolic (congestive) heart failure: Secondary | ICD-10-CM

## 2019-09-08 NOTE — Progress Notes (Signed)
Daily Session Note  Patient Details  Name: Kisa Fujii MRN: [id number] Date of Birth: 08-22-1931 Referring Provider:     Pulmonary Rehab Walk Test from 08/17/2019 in [place]  Referring Provider  Dr. Tamala Julian      Encounter Date: 09/08/2019  Check In: Session Check In - 09/08/19 0959      Check-In   Supervising physician immediately available to respond to emergencies  [facility] immediately available    Physician(s)  Dr. Doristine Bosworth    Location  [facility]    Staff Present  Rosebud Poles, RN, Bjorn Loser, MS, Exercise Physiologist;Lisa Ysidro Evert, RN    Virtual Visit  No    Medication changes reported      No    Fall or balance concerns reported     No    Tobacco Cessation  No Change    Warm-up and Cool-down  Performed on first and last piece of equipment    Resistance Training Performed  Yes    VAD Patient?  No    PAD/SET Patient?  No      Pain Assessment   Currently in Pain?  No/denies    Multiple Pain Sites  No       Capillary Blood Glucose: No results found for this or any previous visit (from the past 24 hour(s)).    Social History   Tobacco Use  Smoking Status Never Smoker  Smokeless Tobacco Never Used    Goals Met:  Proper associated with RPD/PD & O2 Sat Exercise tolerated well Strength training completed today  Goals Unmet:  Not Applicable  Comments: Service time is from 0950 to 16    Dr. Fransico Him is Medical Director for Cardiac Rehab at [facility].

## 2019-09-10 ENCOUNTER — Other Ambulatory Visit: Payer: Self-pay

## 2019-09-10 ENCOUNTER — Encounter (HOSPITAL_COMMUNITY)
Admission: RE | Admit: 2019-09-10 | Discharge: 2019-09-10 | Disposition: A | Discharge status: Home / Self Care | Payer: Medicare Other | Source: Ambulatory Visit | Attending: Interventional Cardiology | Admitting: Interventional Cardiology

## 2019-09-10 DIAGNOSIS — I5032 Chronic diastolic (congestive) heart failure: Secondary | ICD-10-CM

## 2019-09-10 NOTE — Progress Notes (Signed)
Daily Session Note  Patient Details  Name: Lynn Brown MRN: [id number] Date of Birth: August 20, 1931 Referring Provider:     Pulmonary Rehab Walk Test from 08/17/2019 in [place]  Referring Provider  Dr. Tamala Julian      Encounter Date: 09/10/2019  Check In: Session Check In - 09/10/19 1131      Check-In   Supervising physician immediately available to respond to emergencies  [facility] immediately available    Physician(s)  Dr. Erlinda Hong    Location  [facility]    Staff Present  Rosebud Poles, RN, Bjorn Loser, MS, Exercise Physiologist;Lisa Ysidro Evert, RN    Virtual Visit  No    Medication changes reported      No    Fall or balance concerns reported     No    Tobacco Cessation  No Change    Warm-up and Cool-down  Performed as group-led instruction    Resistance Training Performed  Yes    VAD Patient?  No    PAD/SET Patient?  No      Pain Assessment   Currently in Pain?  No/denies    Multiple Pain Sites  No       Capillary Blood Glucose: No results found for this or any previous visit (from the past 24 hour(s)).    Social History   Tobacco Use  Smoking Status Never Smoker  Smokeless Tobacco Never Used    Goals Met:  Proper associated with RPD/PD & O2 Sat Exercise tolerated well Strength training completed today  Goals Unmet:  Not Applicable  Comments: Service time is from 0955 to 80    Dr. Fransico Him is Medical Director for Cardiac Rehab at [facility].

## 2019-09-15 ENCOUNTER — Encounter (HOSPITAL_COMMUNITY)
Admission: RE | Admit: 2019-09-15 | Discharge: 2019-09-15 | Disposition: A | Discharge status: Home / Self Care | Payer: Medicare Other | Source: Ambulatory Visit | Attending: Interventional Cardiology | Admitting: Interventional Cardiology

## 2019-09-15 ENCOUNTER — Other Ambulatory Visit: Payer: Self-pay

## 2019-09-15 VITALS — Wt 191.6 lb

## 2019-09-15 DIAGNOSIS — I5032 Chronic diastolic (congestive) heart failure: Secondary | ICD-10-CM | POA: Diagnosis not present

## 2019-09-15 NOTE — Progress Notes (Signed)
Daily Session Note  Patient Details  Name: Lynn Brown MRN: [id number] Date of Birth: 01/02/1932 Referring Provider:     Pulmonary Rehab Walk Test from 08/17/2019 in [facility]  Referring Provider  Dr. Smith      Encounter Date: 09/15/2019  Check In: Session Check In - 09/15/19 1024      Check-In   Supervising physician immediately available to respond to emergencies  [facility] immediately available    Physician(s)  Dr. Adhikar    Location  [facility]    Staff Present  Joan Behrens, RN, BSN;Dalton Fletcher, MS, Exercise Physiologist;Lisa Hughes, RN    Virtual Visit  No    Medication changes reported      No    Fall or balance concerns reported     No    Tobacco Cessation  No Change    Warm-up and Cool-down  Performed as group-led instruction    Resistance Training Performed  Yes    VAD Patient?  No    PAD/SET Patient?  No      Pain Assessment   Currently in Pain?  No/denies    Multiple Pain Sites  No       Capillary Blood Glucose: No results found for this or any previous visit (from the past 24 hour(s)).  Exercise Prescription Changes - 09/15/19 1100      Response to Exercise   Blood Pressure (Admit)  122/70    Blood Pressure (Exercise)  120/70    Blood Pressure (Exit)  100/64    Heart Rate (Admit)  83 bpm    Heart Rate (Exercise)  91 bpm    Heart Rate (Exit)  81 bpm    Oxygen Saturation (Admit)  98 %    Oxygen Saturation (Exercise)  95 %    Oxygen Saturation (Exit)  96 %    Rating of Perceived Exertion (Exercise)  11    Perceived Dyspnea (Exercise)  2    Duration  Continue with 30 min of aerobic exercise without signs/symptoms of physical distress.    Intensity  THRR unchanged      Progression   Progression  Continue to progress workloads to maintain intensity without signs/symptoms of physical distress.      Resistance Training   Training Prescription  Yes    Weight  orange bands    Reps  10-15     Time  10 Minutes      NuStep   Level  3    SPM  80    Minutes  15    METs  2.3      Arm Ergometer   Level  1    Minutes  15    METs  1.8       Social History   Tobacco Use  Smoking Status Never Smoker  Smokeless Tobacco Never Used    Goals Met:  Exercise tolerated well No report of cardiac concerns or symptoms Strength training completed today  Goals Unmet:  Not Applicable  Comments: Service time is from 0958 to 1100    Dr. Traci Turner is Medical Director for Cardiac Rehab at [facility]. 

## 2019-09-16 NOTE — Progress Notes (Signed)
Pulmonary Individual Treatment Plan  Patient Details  Name: Anthea Udovich MRN: [id number] Date of Birth: 1932-02-21 Referring Provider:     Pulmonary Rehab Walk Test from 08/17/2019 in [facility]  Referring Provider  Dr. Tamala Julian      Initial Encounter Date:    Pulmonary Rehab Walk Test from 08/17/2019 in [facility]  Date  08/18/19      Visit Diagnosis: Heart failure, diastolic, chronic ([place])  Patient's Home Medications on Admission:   Current Outpatient Medications:  .  amLODipine (NORVASC) 2.5 MG tablet, Take 1 tablet (2.5 mg total) by mouth daily., Disp: 90 tablet, Rfl: 2 .  apixaban (ELIQUIS) 5 MG TABS tablet, Take 1 tablet (5 mg total) by mouth 2 (two) times daily., Disp: 180 tablet, Rfl: 1 .  calcium citrate-vitamin D (CITRACAL+D) 315-200 MG-UNIT per tablet, Take 1 tablet by mouth daily. , Disp: , Rfl:  .  Cholecalciferol (VITAMIN D) 2000 UNITS tablet, Take 2,000 Units by mouth daily.  , Disp: , Rfl:  .  Coenzyme Q10 (CO Q 10 PO), Take 1 tablet by mouth daily., Disp: , Rfl:  .  levothyroxine (SYNTHROID, LEVOTHROID) 125 MCG tablet, Take 125 mcg by mouth daily before breakfast. , Disp: , Rfl: 3 .  magnesium oxide (MAG-OX) 400 MG tablet, Take 400 mg by mouth daily., Disp: , Rfl:  .  metoprolol tartrate (LOPRESSOR) 25 MG tablet, Take 0.5 tablets (12.5 mg total) by mouth 2 (two) times daily., Disp: 60 tablet, Rfl: 0 .  nitroGLYCERIN (NITROSTAT) 0.4 MG SL tablet, Place 1 tablet (0.4 mg total) under the tongue every 5 (five) minutes as needed for chest pain., Disp: 30 tablet, Rfl: 0 .  Omega-3 Fatty Acids (FISH OIL PO), Take 1 tablet by mouth daily., Disp: , Rfl:  .  rosuvastatin (CRESTOR) 20 MG tablet, Take 20 mg by mouth daily., Disp: , Rfl:  .  spironolactone (ALDACTONE) 25 MG tablet, Take 1 tablet (25 mg total) by mouth daily., Disp: 90 tablet, Rfl: 2  Past Medical History: Past Medical History:  Diagnosis Date  .  Allergic rhinitis   . Anxiety   . Bunion    PES PLANUS  . Cervical disc disease   . Hearing loss    HEARING AIDS, BUT DOES NOT WEAR THEM CONSISTENTLY.   Marland Kitchen Hypercholesteremia   . Hypertension   . Hypothyroid   . Leaky heart valve    DR. HARWANI  . Mild obesity   . Osteoarthritis of knees, bilateral   . Osteoporosis 12/2017   T score -3.3  . Venous insufficiency     Tobacco Use: Social History   Tobacco Use  Smoking Status Never Smoker  Smokeless Tobacco Never Used    Labs: Recent Review Flowsheet Data    There is no flowsheet data to display.      Capillary Blood Glucose: No results found for: GLUCAP   Pulmonary Assessment Scores: Pulmonary Assessment Scores    Row Name 08/17/19 1119 08/18/19 0924       ADL UCSD   ADL Phase  Entry  Entry    SOB Score total  52  --      CAT Score   CAT Score  17  --      mMRC Score   mMRC Score  --  3      UCSD: Self-administered rating of dyspnea associated with activities of daily living (ADLs) 6-point scale (0 = "not at all" to 5 = "  maximal or unable to do because of breathlessness")  Scoring Scores range from 0 to 120.  Minimally important difference is 5 units  CAT: CAT can identify the health impairment of COPD patients and is better correlated with disease progression.  CAT has a scoring range of zero to 40. The CAT score is classified into four groups of low (less than 10), medium (10 - 20), high (21-30) and very high (31-40) based on the impact level of disease on health status. A CAT score over 10 suggests significant symptoms.  A worsening CAT score could be explained by an exacerbation, poor medication adherence, poor inhaler technique, or progression of COPD or comorbid conditions.  CAT MCID is 2 points  mMRC: mMRC (Modified Medical Research Council) Dyspnea Scale is used to assess the degree of baseline functional disability in patients of respiratory disease due to dyspnea. No minimal important difference  is established. A decrease in score of 1 point or greater is considered a positive change.   Pulmonary Function Assessment: Pulmonary Function Assessment - 08/17/19 1119      Breath   Bilateral Breath Sounds  Clear    Shortness of Breath  Yes;Limiting activity       Exercise Target Goals: Exercise Program Goal: Individual exercise prescription set using results from initial 6 min walk test and THRR while considering  patient's activity barriers and safety.   Exercise Prescription Goal: Initial exercise prescription builds to 30-45 minutes a day of aerobic activity, 2-3 days per week.  Home exercise guidelines will be given to patient during program as part of exercise prescription that the participant will acknowledge.  Activity Barriers & Risk Stratification: Activity Barriers & Cardiac Risk Stratification - 08/17/19 1118      Activity Barriers & Cardiac Risk Stratification   Activity Barriers  Arthritis;Joint Problems;Deconditioning;Muscular Weakness;Shortness of Breath;Balance Concerns;Assistive Device       6 Minute Walk: 6 Minute Walk    Row Name 08/17/19 1156         6 Minute Walk   Phase  Initial     Distance  737 feet     Walk Time  6 minutes     # of Rest Breaks  0     MPH  1.4     METS  0.45 outlier in ACSM 6MWT equation due to high BMI, low distance, low HR, and low BP.     RPE  11     Perceived Dyspnea   0     VO2 Peak  1.57     Symptoms  Yes (comment)     Comments  pushed wheel chair     Resting HR  54 bpm     Resting BP  104/60     Resting Oxygen Saturation   95 %     Exercise Oxygen Saturation  during 6 min walk  91 %     Max Ex. HR  86 bpm     Max Ex. BP  132/70     2 Minute Post BP  110/66       Interval HR   1 Minute HR  57     2 Minute HR  64     3 Minute HR  67     4 Minute HR  67     5 Minute HR  68     6 Minute HR  86     2 Minute Post HR  79     Interval Heart Rate?  Yes       Interval Oxygen   Interval Oxygen?  Yes     Baseline  Oxygen Saturation %  95 %     1 Minute Oxygen Saturation %  96 %     1 Minute Liters of Oxygen  0 L     2 Minute Oxygen Saturation %  91 %     2 Minute Liters of Oxygen  0 L     3 Minute Oxygen Saturation %  93 %     3 Minute Liters of Oxygen  0 L     4 Minute Oxygen Saturation %  91 %     4 Minute Liters of Oxygen  0 L     5 Minute Oxygen Saturation %  92 %     5 Minute Liters of Oxygen  0 L     6 Minute Oxygen Saturation %  93 %     6 Minute Liters of Oxygen  0 L     2 Minute Post Oxygen Saturation %  97 %     2 Minute Post Liters of Oxygen  0 L        Oxygen Initial Assessment: Oxygen Initial Assessment - 08/18/19 0924      Home Oxygen   Home Oxygen Device  None    Sleep Oxygen Prescription  None    Home Exercise Oxygen Prescription  None    Home at Rest Exercise Oxygen Prescription  None    Compliance with Home Oxygen Use  Yes      Initial 6 min Walk   Oxygen Used  None      Program Oxygen Prescription   Program Oxygen Prescription  None      Intervention   Short Term Goals  To learn and exhibit compliance with exercise, home and travel O2 prescription;To learn and understand importance of monitoring SPO2 with pulse oximeter and demonstrate accurate use of the pulse oximeter.;To learn and understand importance of maintaining oxygen saturations>88%;To learn and demonstrate proper pursed lip breathing techniques or other breathing techniques.;To learn and demonstrate proper use of respiratory medications    Long  Term Goals  Exhibits compliance with exercise, home and travel O2 prescription;Verbalizes importance of monitoring SPO2 with pulse oximeter and return demonstration;Maintenance of O2 saturations>88%;Exhibits proper breathing techniques, such as pursed lip breathing or other method taught during program session;Compliance with respiratory medication;Demonstrates proper use of MDI's       Oxygen Re-Evaluation: Oxygen Re-Evaluation    Row Name 09/15/19 0723              Program Oxygen Prescription   Program Oxygen Prescription  None         Home Oxygen   Home Oxygen Device  None       Sleep Oxygen Prescription  None       Home Exercise Oxygen Prescription  None       Home at Rest Exercise Oxygen Prescription  None       Compliance with Home Oxygen Use  Yes         Goals/Expected Outcomes   Short Term Goals  To learn and exhibit compliance with exercise, home and travel O2 prescription;To learn and understand importance of monitoring SPO2 with pulse oximeter and demonstrate accurate use of the pulse oximeter.;To learn and understand importance of maintaining oxygen saturations>88%;To learn and demonstrate proper pursed lip breathing techniques or other breathing techniques.;To learn and demonstrate proper use of respiratory medications  Long  Term Goals  Exhibits compliance with exercise, home and travel O2 prescription;Verbalizes importance of monitoring SPO2 with pulse oximeter and return demonstration;Maintenance of O2 saturations>88%;Exhibits proper breathing techniques, such as pursed lip breathing or other method taught during program session;Compliance with respiratory medication;Demonstrates proper use of MDI's       Goals/Expected Outcomes  compliance          Oxygen Discharge (Final Oxygen Re-Evaluation): Oxygen Re-Evaluation - 09/15/19 0723      Program Oxygen Prescription   Program Oxygen Prescription  None      Home Oxygen   Home Oxygen Device  None    Sleep Oxygen Prescription  None    Home Exercise Oxygen Prescription  None    Home at Rest Exercise Oxygen Prescription  None    Compliance with Home Oxygen Use  Yes      Goals/Expected Outcomes   Short Term Goals  To learn and exhibit compliance with exercise, home and travel O2 prescription;To learn and understand importance of monitoring SPO2 with pulse oximeter and demonstrate accurate use of the pulse oximeter.;To learn and understand importance of maintaining oxygen  saturations>88%;To learn and demonstrate proper pursed lip breathing techniques or other breathing techniques.;To learn and demonstrate proper use of respiratory medications    Long  Term Goals  Exhibits compliance with exercise, home and travel O2 prescription;Verbalizes importance of monitoring SPO2 with pulse oximeter and return demonstration;Maintenance of O2 saturations>88%;Exhibits proper breathing techniques, such as pursed lip breathing or other method taught during program session;Compliance with respiratory medication;Demonstrates proper use of MDI's    Goals/Expected Outcomes  compliance       Initial Exercise Prescription: Initial Exercise Prescription - 08/18/19 0900      Date of Initial Exercise RX and Referring Provider   Date  08/18/19    Referring Provider  Dr. Tamala Julian      NuStep   Level  2    SPM  80    Minutes  15      Arm Ergometer   Level  1    Minutes  15      Prescription Details   Frequency (times per week)  2    Duration  Progress to 30 minutes of continuous aerobic without signs/symptoms of physical distress      Intensity   THRR 40-80% of Max Heartrate  54-108    Ratings of Perceived Exertion  11-13    Perceived Dyspnea  0-4      Progression   Progression  Continue progressive overload as per policy without signs/symptoms or physical distress.      Resistance Training   Training Prescription  Yes    Weight  orange bands    Reps  10-15       Perform Capillary Blood Glucose checks as needed.  Exercise Prescription Changes: Exercise Prescription Changes    Row Name 09/01/19 1100 09/15/19 1100           Response to Exercise   Blood Pressure (Admit)  126/76  122/70      Blood Pressure (Exercise)  114/60  120/70      Blood Pressure (Exit)  100/60  100/64      Heart Rate (Admit)  78 bpm  83 bpm      Heart Rate (Exercise)  89 bpm  91 bpm      Heart Rate (Exit)  84 bpm  81 bpm      Oxygen Saturation (Admit)  96 %  98 %  Oxygen  Saturation (Exercise)  97 %  95 %      Oxygen Saturation (Exit)  98 %  96 %      Rating of Perceived Exertion (Exercise)  11  11      Perceived Dyspnea (Exercise)  1  2      Duration  Continue with 30 min of aerobic exercise without signs/symptoms of physical distress.  Continue with 30 min of aerobic exercise without signs/symptoms of physical distress.      Intensity  THRR unchanged  THRR unchanged        Progression   Progression  Continue to progress workloads to maintain intensity without signs/symptoms of physical distress.  Continue to progress workloads to maintain intensity without signs/symptoms of physical distress.        Resistance Training   Training Prescription  Yes  Yes      Weight  orange bands  orange bands      Reps  10-15  10-15      Time  10 Minutes  10 Minutes        NuStep   Level  2  3      SPM  80  80      Minutes  15  15      METs  1.8  2.3        Arm Ergometer   Level  1  1      Minutes  15  15      METs  --  1.8         Exercise Comments:   Exercise Goals and Review: Exercise Goals    Row Name 08/18/19 0867 09/15/19 0724           Exercise Goals   Increase Physical Activity  Yes  Yes      Intervention  Provide advice, education, support and counseling about physical activity/exercise needs.;Develop an individualized exercise prescription for aerobic and resistive training based on initial evaluation findings, risk stratification, comorbidities and participant's personal goals.  Provide advice, education, support and counseling about physical activity/exercise needs.;Develop an individualized exercise prescription for aerobic and resistive training based on initial evaluation findings, risk stratification, comorbidities and participant's personal goals.      Expected Outcomes  Short Term: Attend rehab on a regular basis to increase amount of physical activity.;Long Term: Add in home exercise to make exercise part of routine and to increase amount  of physical activity.;Long Term: Exercising regularly at least 3-5 days a week.  Short Term: Attend rehab on a regular basis to increase amount of physical activity.;Long Term: Add in home exercise to make exercise part of routine and to increase amount of physical activity.;Long Term: Exercising regularly at least 3-5 days a week.      Increase Strength and Stamina  Yes  Yes      Intervention  Provide advice, education, support and counseling about physical activity/exercise needs.;Develop an individualized exercise prescription for aerobic and resistive training based on initial evaluation findings, risk stratification, comorbidities and participant's personal goals.  Provide advice, education, support and counseling about physical activity/exercise needs.;Develop an individualized exercise prescription for aerobic and resistive training based on initial evaluation findings, risk stratification, comorbidities and participant's personal goals.      Expected Outcomes  Short Term: Increase workloads from initial exercise prescription for resistance, speed, and METs.;Short Term: Perform resistance training exercises routinely during rehab and add in resistance training at home;Long Term: Improve cardiorespiratory fitness, muscular endurance and strength  as measured by increased METs and functional capacity (6MWT)  Short Term: Increase workloads from initial exercise prescription for resistance, speed, and METs.;Short Term: Perform resistance training exercises routinely during rehab and add in resistance training at home;Long Term: Improve cardiorespiratory fitness, muscular endurance and strength as measured by increased METs and functional capacity (6MWT)      Able to understand and use rate of perceived exertion (RPE) scale  Yes  Yes      Intervention  Provide education and explanation on how to use RPE scale  Provide education and explanation on how to use RPE scale      Expected Outcomes  Long Term:  Able  to use RPE to guide intensity level when exercising independently;Short Term: Able to use RPE daily in rehab to express subjective intensity level  Long Term:  Able to use RPE to guide intensity level when exercising independently;Short Term: Able to use RPE daily in rehab to express subjective intensity level      Able to understand and use Dyspnea scale  Yes  Yes      Intervention  Provide education and explanation on how to use Dyspnea scale  Provide education and explanation on how to use Dyspnea scale      Expected Outcomes  Short Term: Able to use Dyspnea scale daily in rehab to express subjective sense of shortness of breath during exertion;Long Term: Able to use Dyspnea scale to guide intensity level when exercising independently  Short Term: Able to use Dyspnea scale daily in rehab to express subjective sense of shortness of breath during exertion;Long Term: Able to use Dyspnea scale to guide intensity level when exercising independently      Knowledge and understanding of Target Heart Rate Range (THRR)  Yes  Yes      Intervention  Provide education and explanation of THRR including how the numbers were predicted and where they are located for reference  Provide education and explanation of THRR including how the numbers were predicted and where they are located for reference      Expected Outcomes  Short Term: Able to state/look up THRR;Short Term: Able to use daily as guideline for intensity in rehab;Long Term: Able to use THRR to govern intensity when exercising independently  Short Term: Able to state/look up THRR;Short Term: Able to use daily as guideline for intensity in rehab;Long Term: Able to use THRR to govern intensity when exercising independently      Understanding of Exercise Prescription  Yes  Yes      Intervention  Provide education, explanation, and written materials on patient's individual exercise prescription  Provide education, explanation, and written materials on patient's  individual exercise prescription      Expected Outcomes  Short Term: Able to explain program exercise prescription;Long Term: Able to explain home exercise prescription to exercise independently  Short Term: Able to explain program exercise prescription;Long Term: Able to explain home exercise prescription to exercise independently         Exercise Goals Re-Evaluation : Exercise Goals Re-Evaluation    Row Name 09/15/19 0724             Exercise Goal Re-Evaluation   Exercise Goals Review  Increase Physical Activity;Increase Strength and Stamina;Able to understand and use rate of perceived exertion (RPE) scale;Able to understand and use Dyspnea scale;Knowledge and understanding of Target Heart Rate Range (THRR);Understanding of Exercise Prescription       Comments  Pt has completed 6 exercise sessions. She is progressing well  and is highly motivated. Her upper body has gotten noticeably stronger from the arm ergometer. She currently exercises at 2.2 METs on the stepper. I expect her to continue to progress. Will continue to monitor and progress as able.       Expected Outcomes  Through exercise at rehab and at home, the patient will decrease shortness of breath with daily activities and feel confident in carrying out an exercise regime at home.          Discharge Exercise Prescription (Final Exercise Prescription Changes): Exercise Prescription Changes - 09/15/19 1100      Response to Exercise   Blood Pressure (Admit)  122/70    Blood Pressure (Exercise)  120/70    Blood Pressure (Exit)  100/64    Heart Rate (Admit)  83 bpm    Heart Rate (Exercise)  91 bpm    Heart Rate (Exit)  81 bpm    Oxygen Saturation (Admit)  98 %    Oxygen Saturation (Exercise)  95 %    Oxygen Saturation (Exit)  96 %    Rating of Perceived Exertion (Exercise)  11    Perceived Dyspnea (Exercise)  2    Duration  Continue with 30 min of aerobic exercise without signs/symptoms of physical distress.    Intensity   THRR unchanged      Progression   Progression  Continue to progress workloads to maintain intensity without signs/symptoms of physical distress.      Resistance Training   Training Prescription  Yes    Weight  orange bands    Reps  10-15    Time  10 Minutes      NuStep   Level  3    SPM  80    Minutes  15    METs  2.3      Arm Ergometer   Level  1    Minutes  15    METs  1.8       Nutrition:  Target Goals: Understanding of nutrition guidelines, daily intake of sodium <1536m, cholesterol <2049m calories 30% from fat and 7% or less from saturated fats, daily to have 5 or more servings of fruits and vegetables.  Biometrics: Pre Biometrics - 08/17/19 1118      Pre Biometrics   Height  5' 1.25" (1.556 m)    Weight  191 lb 12.8 oz (87 kg)    BMI (Calculated)  35.93    Grip Strength  22 kg        Nutrition Therapy Plan and Nutrition Goals: Nutrition Therapy & Goals - 09/01/19 1135      Nutrition Therapy   Diet  Heart Healthy/low sodium      Personal Nutrition Goals   Nutrition Goal  Pt to build a healthy plate including vegetables, fruits, whole grains, and low-fat dairy products in a heart healthy meal plan.      Intervention Plan   Intervention  Prescribe, educate and counsel regarding individualized specific dietary modifications aiming towards targeted core components such as weight, hypertension, lipid management, diabetes, heart failure and other comorbidities.    Expected Outcomes  Short Term Goal: A plan has been developed with personal nutrition goals set during dietitian appointment.;Long Term Goal: Adherence to prescribed nutrition plan.       Nutrition Assessments: Nutrition Assessments - 08/27/19 1135      Rate Your Plate Scores   Pre Score  68       Nutrition Goals Re-Evaluation: Nutrition Goals Re-Evaluation  [place] Name 09/01/19 1135 09/15/19 0927           Goals   Current Weight  191 lb (86.6 kg)  --      Expected Outcome  --  Adequate  nutrition intake         Nutrition Goals Discharge (Final Nutrition Goals Re-Evaluation): Nutrition Goals Re-Evaluation - 09/15/19 0927      Goals   Expected Outcome  Adequate nutrition intake       Psychosocial: Target Goals: Acknowledge presence or absence of significant depression and/or stress, maximize coping skills, provide positive support system. Participant is able to verbalize types and ability to use techniques and skills needed for reducing stress and depression.  Initial Review & Psychosocial Screening: Initial Psych Review & Screening - 08/17/19 1125      Initial Review   Current issues with  None Identified      Family Dynamics   Good Support System?  Yes   daughter lives with her     Barriers   Psychosocial barriers to participate in program  There are no identifiable barriers or psychosocial needs.      Screening Interventions   Interventions  Encouraged to exercise       Quality of Life Scores:  Scores of 19 and below usually indicate a poorer quality of life in these areas.  A difference of  2-3 points is a clinically meaningful difference.  A difference of 2-3 points in the total score of the Quality of Life Index has been associated with significant improvement in overall quality of life, self-image, physical symptoms, and general health in studies assessing change in quality of life.  PHQ-9: Recent Review Flowsheet Data    Depression screen [facility] 2/9 08/17/2019   Decreased Interest 0   Down, Depressed, Hopeless 0   PHQ - 2 Score 0   Altered sleeping 0   Tired, decreased energy 0   Change in appetite 0   Feeling bad or failure about yourself  0   Trouble concentrating 0   Moving slowly or fidgety/restless 0   Suicidal thoughts 0   PHQ-9 Score 0   Difficult doing work/chores Not difficult at all     Interpretation of Total Score  Total Score Depression Severity:  1-4 = Minimal depression, 5-9 = Mild depression, 10-14 = Moderate depression,  15-19 = Moderately severe depression, 20-27 = Severe depression   Psychosocial Evaluation and Intervention: Psychosocial Evaluation - 08/17/19 1126      Psychosocial Evaluation & Interventions   Interventions  Encouraged to exercise with the program and follow exercise prescription    Comments  no psychosocial concerns at this time    Expected Outcomes  will continue to have no psychosocial concerns while in the program    Continue Psychosocial Services   No Follow up required       Psychosocial Re-Evaluation: Psychosocial Re-Evaluation    [place] Name 09/14/19 1245             Psychosocial Re-Evaluation   Current issues with  None Identified       Comments  No psychosocial concerns identified at this time       Expected Outcomes  For Norabelle Kondo to continue to have no barriers or psychosocial concerns while participating in pulmonary rehab.       Interventions  Encouraged to attend Pulmonary Rehabilitation for the exercise       Continue Psychosocial Services   No Follow up required  Psychosocial Discharge (Final Psychosocial Re-Evaluation): Psychosocial Re-Evaluation - 09/14/19 1245      Psychosocial Re-Evaluation   Current issues with  None Identified    Comments  No psychosocial concerns identified at this time    Expected Outcomes  For Denisse Whitenack to continue to have no barriers or psychosocial concerns while participating in pulmonary rehab.    Interventions  Encouraged to attend Pulmonary Rehabilitation for the exercise    Continue Psychosocial Services   No Follow up required       Education: Education Goals: Education classes will be provided on a weekly basis, covering required topics. Participant will state understanding/return demonstration of topics presented.  Learning Barriers/Preferences: Learning Barriers/Preferences - 08/17/19 1128      Learning Barriers/Preferences   Learning Barriers  None    Learning Preferences  Written Material;Skilled  Demonstration;Verbal Instruction;Pictoral;Individual Instruction;Audio;Video       Education Topics: Risk Factor Reduction:  -Group instruction that is supported by a PowerPoint presentation. Instructor discusses the definition of a risk factor, different risk factors for pulmonary disease, and how the heart and lungs work together.     PULMONARY REHAB OTHER RESPIRATORY from 09/10/2019 in [place]  Date  09/03/19  Educator  Handout      Nutrition for Pulmonary Patient:  -Group instruction provided by PowerPoint slides, verbal discussion, and written materials to support subject matter. The instructor gives an explanation and review of healthy diet recommendations, which includes a discussion on weight management, recommendations for fruit and vegetable consumption, as well as protein, fluid, caffeine, fiber, sodium, sugar, and alcohol. Tips for eating when patients are short of breath are discussed.   PULMONARY REHAB OTHER RESPIRATORY from 09/01/2019 in [place]  Date  09/01/19  Educator  Dietician  Instruction Review Code  -- [Handout]      Pursed Lip Breathing:  -Group instruction that is supported by demonstration and informational handouts. Instructor discusses the benefits of pursed lip and diaphragmatic breathing and detailed demonstration on how to preform both.     Oxygen Safety:  -Group instruction provided by PowerPoint, verbal discussion, and written material to support subject matter. There is an overview of "What is Oxygen" and "Why do we need it".  Instructor also reviews how to create a safe environment for oxygen use, the importance of using oxygen as prescribed, and the risks of noncompliance. There is a brief discussion on traveling with oxygen and resources the patient may utilize.   Oxygen Equipment:  -Group instruction provided by [facility] Staff utilizing handouts, written materials, and equipment  demonstrations.   Signs and Symptoms:  -Group instruction provided by written material and verbal discussion to support subject matter. Warning signs and symptoms of infection, stroke, and heart attack are reviewed and when to call the physician/911 reinforced. Tips for preventing the spread of infection discussed.   Advanced Directives:  -Group instruction provided by verbal instruction and written material to support subject matter. Instructor reviews Advanced Directive laws and proper instruction for filling out document.   Pulmonary Video:  -Group video education that reviews the importance of medication and oxygen compliance, exercise, good nutrition, pulmonary hygiene, and pursed lip and diaphragmatic breathing for the pulmonary patient.   Exercise for the Pulmonary Patient:  -Group instruction that is supported by a PowerPoint presentation. Instructor discusses benefits of exercise, core components of exercise, frequency, duration, and intensity of an exercise routine, importance of utilizing pulse oximetry during exercise, safety while exercising,  and options of places to exercise outside of rehab.     Pulmonary Medications:  -Verbally interactive group education provided by instructor with focus on inhaled medications and proper administration.   Anatomy and Physiology of the Respiratory System and Intimacy:  -Group instruction provided by PowerPoint, verbal discussion, and written material to support subject matter. Instructor reviews respiratory cycle and anatomical components of the respiratory system and their functions. Instructor also reviews differences in obstructive and restrictive respiratory diseases with examples of each. Intimacy, Sex, and Sexuality differences are reviewed with a discussion on how relationships can change when diagnosed with pulmonary disease. Common sexual concerns are reviewed.   MD DAY -A group question and answer session with a medical doctor  that allows participants to ask questions that relate to their pulmonary disease state.   OTHER EDUCATION -Group or individual verbal, written, or video instructions that support the educational goals of the pulmonary rehab program.   PULMONARY REHAB OTHER RESPIRATORY from 09/10/2019 in [place]  Date  -- [facility]  Educator  H/O  Instruction Review Code  -- [na]      Holiday Eating Survival Tips:  -Group instruction provided by PowerPoint slides, verbal discussion, and written materials to support subject matter. The instructor gives patients tips, tricks, and techniques to help them not only survive but enjoy the holidays despite the onslaught of food that accompanies the holidays.   Knowledge Questionnaire Score: Knowledge Questionnaire Score - 08/17/19 1129      Knowledge Questionnaire Score   Pre Score  NA Heart failure       Core Components/Risk Factors/Patient Goals at Admission: Personal Goals and Risk Factors at Admission - 08/17/19 1141      Core Components/Risk Factors/Patient Goals on Admission   Heart Failure  Yes    Intervention  Provide a combined exercise and nutrition program that is supplemented with education, support and counseling about heart failure. Directed toward relieving symptoms such as shortness of breath, decreased exercise tolerance, and extremity edema.    Expected Outcomes  Short term: Attendance in program 2-3 days a week with increased exercise capacity. Reported lower sodium intake. Reported increased fruit and vegetable intake. Reports medication compliance.;Short term: Daily weights obtained and reported for increase. Utilizing diuretic protocols set by physician.;Long term: Adoption of self-care skills and reduction of barriers for early signs and symptoms recognition and intervention leading to self-care maintenance.       Core Components/Risk Factors/Patient Goals Review:  Goals and Risk Factor Review     Row Name 08/17/19 1142 09/14/19 1246           Core Components/Risk Factors/Patient Goals Review   Personal Goals Review  Heart Failure  Develop more efficient breathing techniques such as purse lipped breathing and diaphragmatic breathing and practicing self-pacing with activity.;Increase knowledge of respiratory medications and ability to use respiratory devices properly.;Improve shortness of breath with ADL's      Review  --  Allyn Bartelson gives it her all when she exercises with [place], she has increased to level 3 on the nustep, and continues at level 1 on the arm ergomenter which is the more difficult piece of equipment.      Expected Outcomes  --  See admission goals.         Core Components/Risk Factors/Patient Goals at Discharge (Final Review):  Goals and Risk Factor Review - 09/14/19 1246      Core Components/Risk Factors/Patient Goals Review   Personal Goals Review  Develop more efficient breathing techniques such as purse lipped breathing and diaphragmatic breathing and practicing self-pacing with activity.;Increase knowledge of respiratory medications and ability to use respiratory devices properly.;Improve shortness of breath with ADL's    Review  Lyrical Sowle gives it her all when she exercises with [place], she has increased to level 3 on the nustep, and continues at level 1 on the arm ergomenter which is the more difficult piece of equipment.    Expected Outcomes  See admission goals.       ITP Comments:   Comments: ITP REVIEW Pt is making expected progress toward pulmonary rehab goals after completing 7 sessions. Recommend continued exercise, life style modification, education, and utilization of breathing techniques to increase stamina and strength and decrease shortness of breath with exertion.

## 2019-09-17 ENCOUNTER — Other Ambulatory Visit: Payer: Self-pay

## 2019-09-17 ENCOUNTER — Encounter (HOSPITAL_COMMUNITY)
Admission: RE | Admit: 2019-09-17 | Discharge: 2019-09-17 | Disposition: A | Discharge status: Home / Self Care | Payer: Medicare Other | Source: Ambulatory Visit | Attending: Interventional Cardiology | Admitting: Interventional Cardiology

## 2019-09-17 DIAGNOSIS — I5032 Chronic diastolic (congestive) heart failure: Secondary | ICD-10-CM | POA: Diagnosis not present

## 2019-09-17 NOTE — Progress Notes (Signed)
Daily Session Note  Patient Details  Name: Lynn Brown MRN: [id number] Date of Birth: January 25, 1932 Referring Provider:     Pulmonary Rehab Walk Test from 08/17/2019 in [place]  Referring Provider  Dr. Tamala Julian      Encounter Date: 09/17/2019  Check In: Session Check In - 09/17/19 1044      Check-In   Supervising physician immediately available to respond to emergencies  [facility] immediately available    Physician(s)  Dr. Dessa Phi    Location  [facility]    Staff Present  Rosebud Poles, RN, Bjorn Loser, MS, Exercise Physiologist;Ike Maragh Ysidro Evert, RN    Virtual Visit  No    Medication changes reported      No    Fall or balance concerns reported     No    Tobacco Cessation  No Change    Warm-up and Cool-down  Performed as group-led instruction    Resistance Training Performed  Yes    VAD Patient?  No    PAD/SET Patient?  No      Pain Assessment   Currently in Pain?  No/denies    Multiple Pain Sites  No       Capillary Blood Glucose: No results found for this or any previous visit (from the past 24 hour(s)).    Social History   Tobacco Use  Smoking Status Never Smoker  Smokeless Tobacco Never Used    Goals Met:  Exercise tolerated well No report of cardiac concerns or symptoms Strength training completed today  Goals Unmet:  Not Applicable  Comments: Service time is from 0955 to 73    Dr. Fransico Him is Medical Director for Cardiac Rehab at [facility].

## 2019-09-22 ENCOUNTER — Other Ambulatory Visit: Payer: Self-pay

## 2019-09-22 ENCOUNTER — Encounter (HOSPITAL_COMMUNITY)
Admission: RE | Admit: 2019-09-22 | Discharge: 2019-09-22 | Disposition: A | Discharge status: Home / Self Care | Payer: Medicare Other | Source: Ambulatory Visit | Attending: Interventional Cardiology | Admitting: Interventional Cardiology

## 2019-09-22 DIAGNOSIS — I5032 Chronic diastolic (congestive) heart failure: Secondary | ICD-10-CM

## 2019-09-22 NOTE — Progress Notes (Signed)
Daily Session Note  Patient Details  Name: Lynn Brown MRN: [id number] Date of Birth: 01/08/1932 Referring Provider:     Pulmonary Rehab Walk Test from 08/17/2019 in [facility]  Referring Provider  Dr. Smith      Encounter Date: 09/22/2019  Check In: Session Check In - 09/22/19 1151      Check-In   Supervising physician immediately available to respond to emergencies  [facility] immediately available    Physician(s)  Dr. Jennifer Choi    Location  [facility]    Staff Present  Joan Behrens, RN, BSN;Dalton Fletcher, MS, Exercise Physiologist;Lisa Hughes, RN    Virtual Visit  No    Medication changes reported      No    Fall or balance concerns reported     No    Tobacco Cessation  No Change    Warm-up and Cool-down  Performed as group-led instruction    Resistance Training Performed  Yes    VAD Patient?  No    PAD/SET Patient?  No      Pain Assessment   Currently in Pain?  No/denies    Multiple Pain Sites  No       Capillary Blood Glucose: No results found for this or any previous visit (from the past 24 hour(s)).    Social History   Tobacco Use  Smoking Status Never Smoker  Smokeless Tobacco Never Used    Goals Met:  Independence with exercise equipment Exercise tolerated well Strength training completed today  Goals Unmet:  Not Applicable  Comments: Service time is from 0955 to 1055    Dr. Traci Turner is Medical Director for Cardiac Rehab at [facility]. 

## 2019-09-24 ENCOUNTER — Other Ambulatory Visit: Payer: Self-pay

## 2019-09-24 ENCOUNTER — Encounter (HOSPITAL_COMMUNITY)
Admission: RE | Admit: 2019-09-24 | Discharge: 2019-09-24 | Disposition: A | Discharge status: Home / Self Care | Payer: Medicare Other | Source: Ambulatory Visit | Attending: Interventional Cardiology | Admitting: Interventional Cardiology

## 2019-09-24 DIAGNOSIS — I5032 Chronic diastolic (congestive) heart failure: Secondary | ICD-10-CM

## 2019-09-24 NOTE — Progress Notes (Signed)
Daily Session Note  Patient Details  Name: Lynn Brown MRN: [id number] Date of Birth: Nov 14, 1931 Referring Provider:     Pulmonary Rehab Walk Test from 08/17/2019 in [place]  Referring Provider  Dr. Tamala Julian      Encounter Date: 09/24/2019  Check In: Session Check In - 09/24/19 1028      Check-In   Supervising physician immediately available to respond to emergencies  [facility] immediately available    Physician(s)  Dr. Philis Pique    Location  [facility]    Staff Present  Rosebud Poles, RN, Bjorn Loser, MS, Exercise Physiologist;Alecia Doi Ysidro Evert, RN    Virtual Visit  No    Medication changes reported      No    Fall or balance concerns reported     No    Tobacco Cessation  No Change    Warm-up and Cool-down  Performed as group-led instruction    Resistance Training Performed  Yes    VAD Patient?  No    PAD/SET Patient?  No      Pain Assessment   Currently in Pain?  No/denies    Multiple Pain Sites  No       Capillary Blood Glucose: No results found for this or any previous visit (from the past 24 hour(s)).    Social History   Tobacco Use  Smoking Status Never Smoker  Smokeless Tobacco Never Used    Goals Met:  Exercise tolerated well No report of cardiac concerns or symptoms Strength training completed today  Goals Unmet:  Not Applicable  Comments: Service time is from 0955 to 1055    Dr. Fransico Him is Medical Director for Cardiac Rehab at [facility].

## 2019-09-29 ENCOUNTER — Encounter (HOSPITAL_COMMUNITY)
Admission: RE | Admit: 2019-09-29 | Discharge: 2019-09-29 | Disposition: A | Discharge status: Home / Self Care | Payer: Medicare Other | Source: Ambulatory Visit | Attending: Interventional Cardiology | Admitting: Interventional Cardiology

## 2019-09-29 ENCOUNTER — Other Ambulatory Visit: Payer: Self-pay

## 2019-09-29 VITALS — Wt 192.0 lb

## 2019-09-29 DIAGNOSIS — I5032 Chronic diastolic (congestive) heart failure: Secondary | ICD-10-CM | POA: Diagnosis not present

## 2019-09-29 NOTE — Progress Notes (Signed)
Daily Session Note  Patient Details  Name: Lynn Brown MRN: [id number] Date of Birth: 08-Dec-1931 Referring Provider:     Pulmonary Rehab Walk Test from 08/17/2019 in [employer]  Referring Provider  Dr. Tamala Julian      Encounter Date: 09/29/2019  Check In: Session Check In - 09/29/19 0956      Check-In   Supervising physician immediately available to respond to emergencies  [facility] immediately available    Physician(s)  Dr. Philis Pique    Location  [facility]    Staff Present  Rosebud Poles, RN, Bjorn Loser, MS, Exercise Physiologist;Dorleen Kissel Ysidro Evert, RN    Virtual Visit  No    Medication changes reported      No    Fall or balance concerns reported     No    Tobacco Cessation  No Change    Warm-up and Cool-down  Performed as group-led instruction    Resistance Training Performed  Yes    VAD Patient?  No    PAD/SET Patient?  No      Pain Assessment   Currently in Pain?  No/denies    Multiple Pain Sites  No       Capillary Blood Glucose: No results found for this or any previous visit (from the past 24 hour(s)).  Exercise Prescription Changes - 09/29/19 1100      Response to Exercise   Blood Pressure (Admit)  118/74    Blood Pressure (Exercise)  148/86    Blood Pressure (Exit)  100/60    Heart Rate (Admit)  76 bpm    Heart Rate (Exercise)  95 bpm    Heart Rate (Exit)  84 bpm    Oxygen Saturation (Admit)  98 %    Oxygen Saturation (Exercise)  95 %    Oxygen Saturation (Exit)  96 %    Rating of Perceived Exertion (Exercise)  13    Perceived Dyspnea (Exercise)  1    Duration  Continue with 30 min of aerobic exercise without signs/symptoms of physical distress.    Intensity  THRR unchanged      Progression   Progression  Continue to progress workloads to maintain intensity without signs/symptoms of physical distress.      Resistance Training   Training Prescription  Yes    Weight  orange bands    Reps  10-15     Time  10 Minutes      NuStep   Level  4    SPM  80    Minutes  15    METs  2.8      Arm Ergometer   Level  1    Minutes  15    METs  2.1       Social History   Tobacco Use  Smoking Status Never Smoker  Smokeless Tobacco Never Used    Goals Met:  Exercise tolerated well No report of cardiac concerns or symptoms Strength training completed today  Goals Unmet:  Not Applicable  Comments: Service time is from 0956 to 1050    Dr. Fransico Him is Medical Director for Cardiac Rehab at [facility].

## 2019-09-29 NOTE — Progress Notes (Signed)
I have reviewed a Home Exercise Prescription with [facility] . Lynn Brown is  currently exercising at home.  The patient was advised to walk 2 days a week for 30 minutes.  Lynn Brown and I discussed how to progress their exercise prescription.  The patient stated that their goals were to lose weight and to continue getting in better shape.  The patient stated that they understand the exercise prescription.  We reviewed exercise guidelines, target heart rate during exercise, RPE Scale, weather conditions, NTG use, endpoints for exercise, warmup and cool down.  Patient is encouraged to come to me with any questions. I will continue to follow up with the patient to assist them with progression and safety.

## 2019-10-01 ENCOUNTER — Other Ambulatory Visit: Payer: Self-pay

## 2019-10-01 ENCOUNTER — Encounter (HOSPITAL_COMMUNITY)
Admission: RE | Admit: 2019-10-01 | Discharge: 2019-10-01 | Disposition: A | Discharge status: Home / Self Care | Payer: Medicare Other | Source: Ambulatory Visit | Attending: Interventional Cardiology | Admitting: Interventional Cardiology

## 2019-10-01 DIAGNOSIS — I5032 Chronic diastolic (congestive) heart failure: Secondary | ICD-10-CM | POA: Diagnosis not present

## 2019-10-01 NOTE — Progress Notes (Signed)
Daily Session Note  Patient Details  Name: Lynn Brown MRN: [id number] Date of Birth: 1932-05-04 Referring Provider:     Pulmonary Rehab Walk Test from 08/17/2019 in [place]  Referring Provider  Dr. Tamala Julian      Encounter Date: 10/01/2019  Check In: Session Check In - 10/01/19 1005      Check-In   Supervising physician immediately available to respond to emergencies  [facility] immediately available    Physician(s)  Dr. Posey Pronto    Location  [facility]    Staff Present  Rosebud Poles, RN, Bjorn Loser, MS, Exercise Physiologist;Andi Mahaffy Ysidro Evert, RN    Virtual Visit  No    Medication changes reported      No    Fall or balance concerns reported     No    Tobacco Cessation  No Change    Warm-up and Cool-down  Performed as group-led instruction    Resistance Training Performed  Yes    VAD Patient?  No    PAD/SET Patient?  No      Pain Assessment   Currently in Pain?  No/denies    Multiple Pain Sites  No       Capillary Blood Glucose: No results found for this or any previous visit (from the past 24 hour(s)).    Social History   Tobacco Use  Smoking Status Never Smoker  Smokeless Tobacco Never Used    Goals Met:  Exercise tolerated well No report of cardiac concerns or symptoms Strength training completed today  Goals Unmet:  Not Applicable  Comments: Service time is from 0955 to 1058    Dr. Fransico Him is Medical Director for Cardiac Rehab at [facility].

## 2019-10-06 ENCOUNTER — Encounter (HOSPITAL_COMMUNITY)
Admission: RE | Admit: 2019-10-06 | Discharge: 2019-10-06 | Disposition: A | Discharge status: Home / Self Care | Payer: Medicare Other | Source: Ambulatory Visit | Attending: Interventional Cardiology | Admitting: Interventional Cardiology

## 2019-10-06 ENCOUNTER — Other Ambulatory Visit: Payer: Self-pay

## 2019-10-06 DIAGNOSIS — I5032 Chronic diastolic (congestive) heart failure: Secondary | ICD-10-CM

## 2019-10-06 NOTE — Progress Notes (Signed)
Daily Session Note  Patient Details  Name: Lynn Brown MRN: [id number] Date of Birth: 1932-02-27 Referring Provider:     Pulmonary Rehab Walk Test from 08/17/2019 in [place]  Referring Provider  Dr. Tamala Julian      Encounter Date: 10/06/2019  Check In: Session Check In - 10/06/19 0958      Check-In   Supervising physician immediately available to respond to emergencies  [facility] immediately available    Physician(s)  Dr. Posey Pronto    Location  [facility]    Staff Present  Rosebud Poles, RN, Bjorn Loser, MS, Exercise Physiologist;Lisa Ysidro Evert, RN    Virtual Visit  No    Medication changes reported      No    Fall or balance concerns reported     No    Tobacco Cessation  No Change    Warm-up and Cool-down  Performed as group-led instruction    Resistance Training Performed  Yes    VAD Patient?  No    PAD/SET Patient?  No      Pain Assessment   Currently in Pain?  No/denies    Multiple Pain Sites  No       Capillary Blood Glucose: No results found for this or any previous visit (from the past 24 hour(s)).    Social History   Tobacco Use  Smoking Status Never Smoker  Smokeless Tobacco Never Used    Goals Met:  Independence with exercise equipment Exercise tolerated well Strength training completed today  Goals Unmet:  Not Applicable  Comments: Service time is from 1000 to 1100    Dr. Fransico Him is Medical Director for Cardiac Rehab at [facility].

## 2019-10-08 ENCOUNTER — Encounter (HOSPITAL_COMMUNITY)
Admission: RE | Admit: 2019-10-08 | Discharge: 2019-10-08 | Disposition: A | Discharge status: Home / Self Care | Payer: Medicare Other | Source: Ambulatory Visit | Attending: Interventional Cardiology | Admitting: Interventional Cardiology

## 2019-10-08 ENCOUNTER — Other Ambulatory Visit: Payer: Self-pay

## 2019-10-08 DIAGNOSIS — I5032 Chronic diastolic (congestive) heart failure: Secondary | ICD-10-CM | POA: Diagnosis not present

## 2019-10-08 NOTE — Progress Notes (Signed)
Daily Session Note  Patient Details  Name: Lynn Brown MRN: [id number] Date of Birth: 1931/06/07 Referring Provider:     Pulmonary Rehab Walk Test from 08/17/2019 in [place]  Referring Provider  Dr. Tamala Julian      Encounter Date: 10/08/2019  Check In: Session Check In - 10/08/19 1014      Check-In   Supervising physician immediately available to respond to emergencies  [facility] immediately available    Physician(s)  Dr. Cyndia Skeeters    Location  [facility]    Staff Present  Rosebud Poles, RN, Roque Cash, RN    Virtual Visit  No    Medication changes reported      No    Fall or balance concerns reported     No    Tobacco Cessation  No Change    Warm-up and Cool-down  Performed as group-led instruction    Resistance Training Performed  Yes    VAD Patient?  No    PAD/SET Patient?  No      Pain Assessment   Currently in Pain?  No/denies    Multiple Pain Sites  No       Capillary Blood Glucose: No results found for this or any previous visit (from the past 24 hour(s)).    Social History   Tobacco Use  Smoking Status Never Smoker  Smokeless Tobacco Never Used    Goals Met:  Exercise tolerated well No report of cardiac concerns or symptoms Strength training completed today  Goals Unmet:  Not Applicable  Comments: Service time is from 0950 to 1050    Dr. Fransico Him is Medical Director for Cardiac Rehab at [facility].

## 2019-10-09 ENCOUNTER — Other Ambulatory Visit: Payer: Self-pay | Admitting: Interventional Cardiology

## 2019-10-13 ENCOUNTER — Other Ambulatory Visit: Payer: Self-pay

## 2019-10-13 ENCOUNTER — Encounter (HOSPITAL_COMMUNITY)
Admission: RE | Admit: 2019-10-13 | Discharge: 2019-10-13 | Disposition: A | Discharge status: Home / Self Care | Payer: Medicare Other | Source: Ambulatory Visit | Attending: Interventional Cardiology | Admitting: Interventional Cardiology

## 2019-10-13 VITALS — Wt 191.8 lb

## 2019-10-13 DIAGNOSIS — I5032 Chronic diastolic (congestive) heart failure: Secondary | ICD-10-CM

## 2019-10-13 NOTE — Progress Notes (Signed)
Daily Session Note  Patient Details  Name: Lynn Brown MRN: [id number] Date of Birth: 07/11/1931 Referring Provider:     Pulmonary Rehab Walk Test from 08/17/2019 in [facility]  Referring Provider  Dr. Smith      Encounter Date: 10/13/2019  Check In: Session Check In - 10/13/19 1003      Check-In   Supervising physician immediately available to respond to emergencies  [facility] immediately available    Physician(s)  Dr. Gonfa    Location  [facility]    Staff Present  Dalton Fletcher, MS, CEP, Exercise Physiologist;Lisa Hughes, RN;Joan Behrens, RN, BSN    Virtual Visit  No    Medication changes reported      No    Fall or balance concerns reported     No    Tobacco Cessation  No Change    Warm-up and Cool-down  Performed as group-led instruction    Resistance Training Performed  Yes    VAD Patient?  No    PAD/SET Patient?  No      Pain Assessment   Currently in Pain?  No/denies    Multiple Pain Sites  No       Capillary Blood Glucose: No results found for this or any previous visit (from the past 24 hour(s)).  Exercise Prescription Changes - 10/13/19 1100      Response to Exercise   Blood Pressure (Admit)  100/60    Blood Pressure (Exercise)  140/80    Blood Pressure (Exit)  110/60    Heart Rate (Admit)  69 bpm    Heart Rate (Exercise)  97 bpm    Heart Rate (Exit)  79 bpm    Oxygen Saturation (Admit)  97 %    Oxygen Saturation (Exercise)  96 %    Oxygen Saturation (Exit)  95 %    Rating of Perceived Exertion (Exercise)  13    Perceived Dyspnea (Exercise)  1    Duration  Continue with 30 min of aerobic exercise without signs/symptoms of physical distress.    Intensity  THRR unchanged      Progression   Progression  Continue to progress workloads to maintain intensity without signs/symptoms of physical distress.      Resistance Training   Training Prescription  Yes    Weight  orange bands    Reps   10-15    Time  10 Minutes      NuStep   Level  5    SPM  80    Minutes  15    METs  2.8      Arm Ergometer   Level  2    Minutes  15    METs  2.5       Social History   Tobacco Use  Smoking Status Never Smoker  Smokeless Tobacco Never Used    Goals Met:  Exercise tolerated well No report of cardiac concerns or symptoms Strength training completed today  Goals Unmet:  Not Applicable  Comments: Service time is from 0950 to 1050    Dr. Traci Turner is Medical Director for Cardiac Rehab at [facility]. 

## 2019-10-14 NOTE — Progress Notes (Signed)
Pulmonary Individual Treatment Plan  Patient Details  Name: Lynn Brown MRN: [id number] Date of Birth: 1932-04-22 Referring Provider:     Pulmonary Rehab Walk Test from 08/17/2019 in [place]  Referring Provider  Dr. Tamala Julian      Initial Encounter Date:    Pulmonary Rehab Walk Test from 08/17/2019 in [place]  Date  08/18/19      Visit Diagnosis: Heart failure, diastolic, chronic ([place])  Patient's Home Medications on Admission:   Current Outpatient Medications:  .  amLODipine (NORVASC) 2.5 MG tablet, Take 1 tablet (2.5 mg total) by mouth daily., Disp: 90 tablet, Rfl: 2 .  apixaban (ELIQUIS) 5 MG TABS tablet, Take 1 tablet (5 mg total) by mouth 2 (two) times daily., Disp: 180 tablet, Rfl: 1 .  calcium citrate-vitamin D (CITRACAL+D) 315-200 MG-UNIT per tablet, Take 1 tablet by mouth daily. , Disp: , Rfl:  .  Cholecalciferol (VITAMIN D) 2000 UNITS tablet, Take 2,000 Units by mouth daily.  , Disp: , Rfl:  .  Coenzyme Q10 (CO Q 10 PO), Take 1 tablet by mouth daily., Disp: , Rfl:  .  levothyroxine (SYNTHROID, LEVOTHROID) 125 MCG tablet, Take 125 mcg by mouth daily before breakfast. , Disp: , Rfl: 3 .  magnesium oxide (MAG-OX) 400 MG tablet, Take 400 mg by mouth daily., Disp: , Rfl:  .  metoprolol tartrate (LOPRESSOR) 25 MG tablet, Take 0.5 tablets (12.5 mg total) by mouth 2 (two) times daily., Disp: 60 tablet, Rfl: 0 .  nitroGLYCERIN (NITROSTAT) 0.4 MG SL tablet, Place 1 tablet (0.4 mg total) under the tongue every 5 (five) minutes as needed for chest pain., Disp: 30 tablet, Rfl: 0 .  Omega-3 Fatty Acids (FISH OIL PO), Take 1 tablet by mouth daily., Disp: , Rfl:  .  rosuvastatin (CRESTOR) 20 MG tablet, Take 20 mg by mouth daily., Disp: , Rfl:  .  spironolactone (ALDACTONE) 25 MG tablet, TAKE 1 TABLET BY MOUTH  DAILY, Disp: 90 tablet, Rfl: 1  Past Medical History: Past Medical History:  Diagnosis Date  . Allergic  rhinitis   . Anxiety   . Bunion    PES PLANUS  . Cervical disc disease   . Hearing loss    HEARING AIDS, BUT DOES NOT WEAR THEM CONSISTENTLY.   Marland Kitchen Hypercholesteremia   . Hypertension   . Hypothyroid   . Leaky heart valve    DR. HARWANI  . Mild obesity   . Osteoarthritis of knees, bilateral   . Osteoporosis 12/2017   T score -3.3  . Venous insufficiency     Tobacco Use: Social History   Tobacco Use  Smoking Status Never Smoker  Smokeless Tobacco Never Used    Labs: Recent Review Flowsheet Data    There is no flowsheet data to display.      Capillary Blood Glucose: No results found for: GLUCAP   Pulmonary Assessment Scores: Pulmonary Assessment Scores    Row Name 08/17/19 1119 08/18/19 0924       ADL UCSD   ADL Phase  Entry  Entry    SOB Score total  52  --      CAT Score   CAT Score  17  --      mMRC Score   mMRC Score  --  3      UCSD: Self-administered rating of dyspnea associated with activities of daily living (ADLs) 6-point scale (0 = "not at all" to 5 = "maximal  or unable to do because of breathlessness")  Scoring Scores range from 0 to 120.  Minimally important difference is 5 units  CAT: CAT can identify the health impairment of COPD patients and is better correlated with disease progression.  CAT has a scoring range of zero to 40. The CAT score is classified into four groups of low (less than 10), medium (10 - 20), high (21-30) and very high (31-40) based on the impact level of disease on health status. A CAT score over 10 suggests significant symptoms.  A worsening CAT score could be explained by an exacerbation, poor medication adherence, poor inhaler technique, or progression of COPD or comorbid conditions.  CAT MCID is 2 points  mMRC: mMRC (Modified Medical Research Council) Dyspnea Scale is used to assess the degree of baseline functional disability in patients of respiratory disease due to dyspnea. No minimal important difference is  established. A decrease in score of 1 point or greater is considered a positive change.   Pulmonary Function Assessment: Pulmonary Function Assessment - 08/17/19 1119      Breath   Bilateral Breath Sounds  Clear    Shortness of Breath  Yes;Limiting activity       Exercise Target Goals: Exercise Program Goal: Individual exercise prescription set using results from initial 6 min walk test and THRR while considering  patient's activity barriers and safety.   Exercise Prescription Goal: Initial exercise prescription builds to 30-45 minutes a day of aerobic activity, 2-3 days per week.  Home exercise guidelines will be given to patient during program as part of exercise prescription that the participant will acknowledge.  Activity Barriers & Risk Stratification: Activity Barriers & Cardiac Risk Stratification - 08/17/19 1118      Activity Barriers & Cardiac Risk Stratification   Activity Barriers  Arthritis;Joint Problems;Deconditioning;Muscular Weakness;Shortness of Breath;Balance Concerns;Assistive Device       6 Minute Walk: 6 Minute Walk    Row Name 08/17/19 1156         6 Minute Walk   Phase  Initial     Distance  737 feet     Walk Time  6 minutes     # of Rest Breaks  0     MPH  1.4     METS  0.45 outlier in ACSM 6MWT equation due to high BMI, low distance, low HR, and low BP.     RPE  11     Perceived Dyspnea   0     VO2 Peak  1.57     Symptoms  Yes (comment)     Comments  pushed wheel chair     Resting HR  54 bpm     Resting BP  104/60     Resting Oxygen Saturation   95 %     Exercise Oxygen Saturation  during 6 min walk  91 %     Max Ex. HR  86 bpm     Max Ex. BP  132/70     2 Minute Post BP  110/66       Interval HR   1 Minute HR  57     2 Minute HR  64     3 Minute HR  67     4 Minute HR  67     5 Minute HR  68     6 Minute HR  86     2 Minute Post HR  79     Interval Heart Rate?  Yes  Interval Oxygen   Interval Oxygen?  Yes     Baseline  Oxygen Saturation %  95 %     1 Minute Oxygen Saturation %  96 %     1 Minute Liters of Oxygen  0 L     2 Minute Oxygen Saturation %  91 %     2 Minute Liters of Oxygen  0 L     3 Minute Oxygen Saturation %  93 %     3 Minute Liters of Oxygen  0 L     4 Minute Oxygen Saturation %  91 %     4 Minute Liters of Oxygen  0 L     5 Minute Oxygen Saturation %  92 %     5 Minute Liters of Oxygen  0 L     6 Minute Oxygen Saturation %  93 %     6 Minute Liters of Oxygen  0 L     2 Minute Post Oxygen Saturation %  97 %     2 Minute Post Liters of Oxygen  0 L        Oxygen Initial Assessment: Oxygen Initial Assessment - 08/18/19 0924      Home Oxygen   Home Oxygen Device  None    Sleep Oxygen Prescription  None    Home Exercise Oxygen Prescription  None    Home at Rest Exercise Oxygen Prescription  None    Compliance with Home Oxygen Use  Yes      Initial 6 min Walk   Oxygen Used  None      Program Oxygen Prescription   Program Oxygen Prescription  None      Intervention   Short Term Goals  To learn and exhibit compliance with exercise, home and travel O2 prescription;To learn and understand importance of monitoring SPO2 with pulse oximeter and demonstrate accurate use of the pulse oximeter.;To learn and understand importance of maintaining oxygen saturations>88%;To learn and demonstrate proper pursed lip breathing techniques or other breathing techniques.;To learn and demonstrate proper use of respiratory medications    Long  Term Goals  Exhibits compliance with exercise, home and travel O2 prescription;Verbalizes importance of monitoring SPO2 with pulse oximeter and return demonstration;Maintenance of O2 saturations>88%;Exhibits proper breathing techniques, such as pursed lip breathing or other method taught during program session;Compliance with respiratory medication;Demonstrates proper use of MDI's       Oxygen Re-Evaluation: Oxygen Re-Evaluation    Row Name 09/15/19 0723  10/13/19 0658           Program Oxygen Prescription   Program Oxygen Prescription  None  None        Home Oxygen   Home Oxygen Device  None  None      Sleep Oxygen Prescription  None  None      Home Exercise Oxygen Prescription  None  None      Home at Rest Exercise Oxygen Prescription  None  None      Compliance with Home Oxygen Use  Yes  Yes        Goals/Expected Outcomes   Short Term Goals  To learn and exhibit compliance with exercise, home and travel O2 prescription;To learn and understand importance of monitoring SPO2 with pulse oximeter and demonstrate accurate use of the pulse oximeter.;To learn and understand importance of maintaining oxygen saturations>88%;To learn and demonstrate proper pursed lip breathing techniques or other breathing techniques.;To learn and demonstrate proper use of respiratory medications  To learn  and exhibit compliance with exercise, home and travel O2 prescription;To learn and understand importance of monitoring SPO2 with pulse oximeter and demonstrate accurate use of the pulse oximeter.;To learn and understand importance of maintaining oxygen saturations>88%;To learn and demonstrate proper pursed lip breathing techniques or other breathing techniques.;To learn and demonstrate proper use of respiratory medications      Long  Term Goals  Exhibits compliance with exercise, home and travel O2 prescription;Verbalizes importance of monitoring SPO2 with pulse oximeter and return demonstration;Maintenance of O2 saturations>88%;Exhibits proper breathing techniques, such as pursed lip breathing or other method taught during program session;Compliance with respiratory medication;Demonstrates proper use of MDI's  Exhibits compliance with exercise, home and travel O2 prescription;Verbalizes importance of monitoring SPO2 with pulse oximeter and return demonstration;Maintenance of O2 saturations>88%;Exhibits proper breathing techniques, such as pursed lip breathing or other  method taught during program session;Compliance with respiratory medication;Demonstrates proper use of MDI's      Goals/Expected Outcomes  compliance  compliance         Oxygen Discharge (Final Oxygen Re-Evaluation): Oxygen Re-Evaluation - 10/13/19 0658      Program Oxygen Prescription   Program Oxygen Prescription  None      Home Oxygen   Home Oxygen Device  None    Sleep Oxygen Prescription  None    Home Exercise Oxygen Prescription  None    Home at Rest Exercise Oxygen Prescription  None    Compliance with Home Oxygen Use  Yes      Goals/Expected Outcomes   Short Term Goals  To learn and exhibit compliance with exercise, home and travel O2 prescription;To learn and understand importance of monitoring SPO2 with pulse oximeter and demonstrate accurate use of the pulse oximeter.;To learn and understand importance of maintaining oxygen saturations>88%;To learn and demonstrate proper pursed lip breathing techniques or other breathing techniques.;To learn and demonstrate proper use of respiratory medications    Long  Term Goals  Exhibits compliance with exercise, home and travel O2 prescription;Verbalizes importance of monitoring SPO2 with pulse oximeter and return demonstration;Maintenance of O2 saturations>88%;Exhibits proper breathing techniques, such as pursed lip breathing or other method taught during program session;Compliance with respiratory medication;Demonstrates proper use of MDI's    Goals/Expected Outcomes  compliance       Initial Exercise Prescription: Initial Exercise Prescription - 08/18/19 0900      Date of Initial Exercise RX and Referring Provider   Date  08/18/19    Referring Provider  Dr. Tamala Julian      NuStep   Level  2    SPM  80    Minutes  15      Arm Ergometer   Level  1    Minutes  15      Prescription Details   Frequency (times per week)  2    Duration  Progress to 30 minutes of continuous aerobic without signs/symptoms of physical distress       Intensity   THRR 40-80% of Max Heartrate  54-108    Ratings of Perceived Exertion  11-13    Perceived Dyspnea  0-4      Progression   Progression  Continue progressive overload as per policy without signs/symptoms or physical distress.      Resistance Training   Training Prescription  Yes    Weight  orange bands    Reps  10-15       Perform Capillary Blood Glucose checks as needed.  Exercise Prescription Changes: Exercise Prescription Changes    Row Name  09/01/19 1100 09/15/19 1100 09/29/19 1100 10/13/19 1100       Response to Exercise   Blood Pressure (Admit)  126/76  122/70  118/74  100/60    Blood Pressure (Exercise)  114/60  120/70  148/86  140/80    Blood Pressure (Exit)  100/60  100/64  100/60  110/60    Heart Rate (Admit)  78 bpm  83 bpm  76 bpm  69 bpm    Heart Rate (Exercise)  89 bpm  91 bpm  95 bpm  97 bpm    Heart Rate (Exit)  84 bpm  81 bpm  84 bpm  79 bpm    Oxygen Saturation (Admit)  96 %  98 %  98 %  97 %    Oxygen Saturation (Exercise)  97 %  95 %  95 %  96 %    Oxygen Saturation (Exit)  98 %  96 %  96 %  95 %    Rating of Perceived Exertion (Exercise)  11  11  13  13     Perceived Dyspnea (Exercise)  1  2  1  1     Duration  Continue with 30 min of aerobic exercise without signs/symptoms of physical distress.  Continue with 30 min of aerobic exercise without signs/symptoms of physical distress.  Continue with 30 min of aerobic exercise without signs/symptoms of physical distress.  Continue with 30 min of aerobic exercise without signs/symptoms of physical distress.    Intensity  THRR unchanged  THRR unchanged  THRR unchanged  THRR unchanged      Progression   Progression  Continue to progress workloads to maintain intensity without signs/symptoms of physical distress.  Continue to progress workloads to maintain intensity without signs/symptoms of physical distress.  Continue to progress workloads to maintain intensity without signs/symptoms of physical distress.   Continue to progress workloads to maintain intensity without signs/symptoms of physical distress.      Resistance Training   Training Prescription  Yes  Yes  Yes  Yes    Weight  orange bands  orange bands  orange bands  orange bands    Reps  10-15  10-15  10-15  10-15    Time  10 Minutes  10 Minutes  10 Minutes  10 Minutes      NuStep   Level  2  3  4  5     SPM  80  80  80  80    Minutes  15  15  15  15     METs  1.8  2.3  2.8  2.8      Arm Ergometer   Level  1  1  1  2     Minutes  15  15  15  15     METs  --  1.8  2.1  2.5      Home Exercise Plan   Plans to continue exercise at  --  --  [facility] (comment)  --    Frequency  --  --  Add 2 additional days to program exercise sessions.  --    Initial Home Exercises Provided  --  --  09/29/19  --       Exercise Comments: Exercise Comments    Row Name 09/29/19 1208           Exercise Comments  home exercise complete          Exercise Goals and Review: Exercise Goals    Row Name  08/18/19 3419 09/15/19 0724 10/13/19 0658         Exercise Goals   Increase Physical Activity  Yes  Yes  Yes     Intervention  Provide advice, education, support and counseling about physical activity/exercise needs.;Develop an individualized exercise prescription for aerobic and resistive training based on initial evaluation findings, risk stratification, comorbidities and participant's personal goals.  Provide advice, education, support and counseling about physical activity/exercise needs.;Develop an individualized exercise prescription for aerobic and resistive training based on initial evaluation findings, risk stratification, comorbidities and participant's personal goals.  Provide advice, education, support and counseling about physical activity/exercise needs.;Develop an individualized exercise prescription for aerobic and resistive training based on initial evaluation findings, risk stratification, comorbidities and participant's  personal goals.     Expected Outcomes  Short Term: Attend rehab on a regular basis to increase amount of physical activity.;Long Term: Add in home exercise to make exercise part of routine and to increase amount of physical activity.;Long Term: Exercising regularly at least 3-5 days a week.  Short Term: Attend rehab on a regular basis to increase amount of physical activity.;Long Term: Add in home exercise to make exercise part of routine and to increase amount of physical activity.;Long Term: Exercising regularly at least 3-5 days a week.  Short Term: Attend rehab on a regular basis to increase amount of physical activity.;Long Term: Add in home exercise to make exercise part of routine and to increase amount of physical activity.;Long Term: Exercising regularly at least 3-5 days a week.     Increase Strength and Stamina  Yes  Yes  Yes     Intervention  Provide advice, education, support and counseling about physical activity/exercise needs.;Develop an individualized exercise prescription for aerobic and resistive training based on initial evaluation findings, risk stratification, comorbidities and participant's personal goals.  Provide advice, education, support and counseling about physical activity/exercise needs.;Develop an individualized exercise prescription for aerobic and resistive training based on initial evaluation findings, risk stratification, comorbidities and participant's personal goals.  Provide advice, education, support and counseling about physical activity/exercise needs.;Develop an individualized exercise prescription for aerobic and resistive training based on initial evaluation findings, risk stratification, comorbidities and participant's personal goals.     Expected Outcomes  Short Term: Increase workloads from initial exercise prescription for resistance, speed, and METs.;Short Term: Perform resistance training exercises routinely during rehab and add in resistance training at  home;Long Term: Improve cardiorespiratory fitness, muscular endurance and strength as measured by increased METs and functional capacity (6MWT)  Short Term: Increase workloads from initial exercise prescription for resistance, speed, and METs.;Short Term: Perform resistance training exercises routinely during rehab and add in resistance training at home;Long Term: Improve cardiorespiratory fitness, muscular endurance and strength as measured by increased METs and functional capacity (6MWT)  Short Term: Increase workloads from initial exercise prescription for resistance, speed, and METs.;Short Term: Perform resistance training exercises routinely during rehab and add in resistance training at home;Long Term: Improve cardiorespiratory fitness, muscular endurance and strength as measured by increased METs and functional capacity (6MWT)     Able to understand and use rate of perceived exertion (RPE) scale  Yes  Yes  Yes     Intervention  Provide education and explanation on how to use RPE scale  Provide education and explanation on how to use RPE scale  Provide education and explanation on how to use RPE scale     Expected Outcomes  Long Term:  Able to use RPE to guide intensity level when exercising  independently;Short Term: Able to use RPE daily in rehab to express subjective intensity level  Long Term:  Able to use RPE to guide intensity level when exercising independently;Short Term: Able to use RPE daily in rehab to express subjective intensity level  Long Term:  Able to use RPE to guide intensity level when exercising independently;Short Term: Able to use RPE daily in rehab to express subjective intensity level     Able to understand and use Dyspnea scale  Yes  Yes  Yes     Intervention  Provide education and explanation on how to use Dyspnea scale  Provide education and explanation on how to use Dyspnea scale  Provide education and explanation on how to use Dyspnea scale     Expected Outcomes  Short Term:  Able to use Dyspnea scale daily in rehab to express subjective sense of shortness of breath during exertion;Long Term: Able to use Dyspnea scale to guide intensity level when exercising independently  Short Term: Able to use Dyspnea scale daily in rehab to express subjective sense of shortness of breath during exertion;Long Term: Able to use Dyspnea scale to guide intensity level when exercising independently  Short Term: Able to use Dyspnea scale daily in rehab to express subjective sense of shortness of breath during exertion;Long Term: Able to use Dyspnea scale to guide intensity level when exercising independently     Knowledge and understanding of Target Heart Rate Range (THRR)  Yes  Yes  Yes     Intervention  Provide education and explanation of THRR including how the numbers were predicted and where they are located for reference  Provide education and explanation of THRR including how the numbers were predicted and where they are located for reference  Provide education and explanation of THRR including how the numbers were predicted and where they are located for reference     Expected Outcomes  Short Term: Able to state/look up THRR;Short Term: Able to use daily as guideline for intensity in rehab;Long Term: Able to use THRR to govern intensity when exercising independently  Short Term: Able to state/look up THRR;Short Term: Able to use daily as guideline for intensity in rehab;Long Term: Able to use THRR to govern intensity when exercising independently  Short Term: Able to state/look up THRR;Short Term: Able to use daily as guideline for intensity in rehab;Long Term: Able to use THRR to govern intensity when exercising independently     Understanding of Exercise Prescription  Yes  Yes  Yes     Intervention  Provide education, explanation, and written materials on patient's individual exercise prescription  Provide education, explanation, and written materials on patient's individual exercise  prescription  Provide education, explanation, and written materials on patient's individual exercise prescription     Expected Outcomes  Short Term: Able to explain program exercise prescription;Long Term: Able to explain home exercise prescription to exercise independently  Short Term: Able to explain program exercise prescription;Long Term: Able to explain home exercise prescription to exercise independently  Short Term: Able to explain program exercise prescription;Long Term: Able to explain home exercise prescription to exercise independently        Exercise Goals Re-Evaluation : Exercise Goals Re-Evaluation    Row Name 09/15/19 0724 10/13/19 0659           Exercise Goal Re-Evaluation   Exercise Goals Review  Increase Physical Activity;Increase Strength and Stamina;Able to understand and use rate of perceived exertion (RPE) scale;Able to understand and use Dyspnea scale;Knowledge and understanding of  Target Heart Rate Range (THRR);Understanding of Exercise Prescription  Increase Physical Activity;Increase Strength and Stamina;Able to understand and use rate of perceived exertion (RPE) scale;Able to understand and use Dyspnea scale;Knowledge and understanding of Target Heart Rate Range (THRR);Understanding of Exercise Prescription      Comments  Pt has completed 6 exercise sessions. She is progressing well and is highly motivated. Her upper body has gotten noticeably stronger from the arm ergometer. She currently exercises at 2.2 METs on the stepper. I expect her to continue to progress. Will continue to monitor and progress as able.  Pt has completed 14 exercise sessions. She is still progressing well and remains motivated. She currently exercises at 2.8 METs on the stepper and has been able to increase the resistance on the arm ergometer. Will continue to monitor and progress as able.      Expected Outcomes  Through exercise at rehab and at home, the patient will decrease shortness of breath with  daily activities and feel confident in carrying out an exercise regime at home.  Through exercise at rehab and at home, the patient will decrease shortness of breath with daily activities and feel confident in carrying out an exercise regime at home.         Discharge Exercise Prescription (Final Exercise Prescription Changes): Exercise Prescription Changes - 10/13/19 1100      Response to Exercise   Blood Pressure (Admit)  100/60    Blood Pressure (Exercise)  140/80    Blood Pressure (Exit)  110/60    Heart Rate (Admit)  69 bpm    Heart Rate (Exercise)  97 bpm    Heart Rate (Exit)  79 bpm    Oxygen Saturation (Admit)  97 %    Oxygen Saturation (Exercise)  96 %    Oxygen Saturation (Exit)  95 %    Rating of Perceived Exertion (Exercise)  13    Perceived Dyspnea (Exercise)  1    Duration  Continue with 30 min of aerobic exercise without signs/symptoms of physical distress.    Intensity  THRR unchanged      Progression   Progression  Continue to progress workloads to maintain intensity without signs/symptoms of physical distress.      Resistance Training   Training Prescription  Yes    Weight  orange bands    Reps  10-15    Time  10 Minutes      NuStep   Level  5    SPM  80    Minutes  15    METs  2.8      Arm Ergometer   Level  2    Minutes  15    METs  2.5       Nutrition:  Target Goals: Understanding of nutrition guidelines, daily intake of sodium <1593m, cholesterol <2086m calories 30% from fat and 7% or less from saturated fats, daily to have 5 or more servings of fruits and vegetables.  Biometrics: Pre Biometrics - 08/17/19 1118      Pre Biometrics   Height  5' 1.25" (1.556 m)    Weight  191 lb 12.8 oz (87 kg)    BMI (Calculated)  35.93    Grip Strength  22 kg        Nutrition Therapy Plan and Nutrition Goals: Nutrition Therapy & Goals - 09/01/19 1135      Nutrition Therapy   Diet  Heart Healthy/low sodium      Personal Nutrition Goals  Nutrition Goal  Pt to build a healthy plate including vegetables, fruits, whole grains, and low-fat dairy products in a heart healthy meal plan.      Intervention Plan   Intervention  Prescribe, educate and counsel regarding individualized specific dietary modifications aiming towards targeted core components such as weight, hypertension, lipid management, diabetes, heart failure and other comorbidities.    Expected Outcomes  Short Term Goal: A plan has been developed with personal nutrition goals set during dietitian appointment.;Long Term Goal: Adherence to prescribed nutrition plan.       Nutrition Assessments: Nutrition Assessments - 08/27/19 1135      Rate Your Plate Scores   Pre Score  68       Nutrition Goals Re-Evaluation: Nutrition Goals Re-Evaluation    Row Name 09/01/19 1135 09/15/19 0927 10/14/19 0813         Goals   Current Weight  191 lb (86.6 kg)  --  191 lb (86.6 kg)     Nutrition Goal  --  --  Pt to build a healthy plate including vegetables, fruits, whole grains, and low-fat dairy products in a heart healthy meal plan.     Expected Outcome  --  Adequate nutrition intake  Adequate nutrition intake        Nutrition Goals Discharge (Final Nutrition Goals Re-Evaluation): Nutrition Goals Re-Evaluation - 10/14/19 0813      Goals   Current Weight  191 lb (86.6 kg)    Nutrition Goal  Pt to build a healthy plate including vegetables, fruits, whole grains, and low-fat dairy products in a heart healthy meal plan.    Expected Outcome  Adequate nutrition intake       Psychosocial: Target Goals: Acknowledge presence or absence of significant depression and/or stress, maximize coping skills, provide positive support system. Participant is able to verbalize types and ability to use techniques and skills needed for reducing stress and depression.  Initial Review & Psychosocial Screening: Initial Psych Review & Screening - 08/17/19 1125      Initial Review   Current  issues with  None Identified      Family Dynamics   Good Support System?  Yes   daughter lives with her     Barriers   Psychosocial barriers to participate in program  There are no identifiable barriers or psychosocial needs.      Screening Interventions   Interventions  Encouraged to exercise       Quality of Life Scores:  Scores of 19 and below usually indicate a poorer quality of life in these areas.  A difference of  2-3 points is a clinically meaningful difference.  A difference of 2-3 points in the total score of the Quality of Life Index has been associated with significant improvement in overall quality of life, self-image, physical symptoms, and general health in studies assessing change in quality of life.  PHQ-9: Recent Review Flowsheet Data    Depression screen [facility] 2/9 08/17/2019   Decreased Interest 0   Down, Depressed, Hopeless 0   PHQ - 2 Score 0   Altered sleeping 0   Tired, decreased energy 0   Change in appetite 0   Feeling bad or failure about yourself  0   Trouble concentrating 0   Moving slowly or fidgety/restless 0   Suicidal thoughts 0   PHQ-9 Score 0   Difficult doing work/chores Not difficult at all     Interpretation of Total Score  Total Score Depression Severity:  1-4 =  Minimal depression, 5-9 = Mild depression, 10-14 = Moderate depression, 15-19 = Moderately severe depression, 20-27 = Severe depression   Psychosocial Evaluation and Intervention: Psychosocial Evaluation - 08/17/19 1126      Psychosocial Evaluation & Interventions   Interventions  Encouraged to exercise with the program and follow exercise prescription    Comments  no psychosocial concerns at this time    Expected Outcomes  will continue to have no psychosocial concerns while in the program    Continue Psychosocial Services   No Follow up required       Psychosocial Re-Evaluation: Psychosocial Re-Evaluation    Rison Name 09/14/19 1245 10/13/19 7628            Psychosocial Re-Evaluation   Current issues with  None Identified  None Identified      Comments  No psychosocial concerns identified at this time  No barriers or psychosocial concerns identified at this time.      Expected Outcomes  For Hilde Churchman to continue to have no barriers or psychosocial concerns while participating in pulmonary rehab.  For patient to continue to have no psychosocial concerns while in pulmonary rehab      Interventions  Encouraged to attend Pulmonary Rehabilitation for the exercise  Encouraged to attend Pulmonary Rehabilitation for the exercise      Continue Psychosocial Services   No Follow up required  No Follow up required         Psychosocial Discharge (Final Psychosocial Re-Evaluation): Psychosocial Re-Evaluation - 10/13/19 3151      Psychosocial Re-Evaluation   Current issues with  None Identified    Comments  No barriers or psychosocial concerns identified at this time.    Expected Outcomes  For patient to continue to have no psychosocial concerns while in pulmonary rehab    Interventions  Encouraged to attend Pulmonary Rehabilitation for the exercise    Continue Psychosocial Services   No Follow up required       Education: Education Goals: Education classes will be provided on a weekly basis, covering required topics. Participant will state understanding/return demonstration of topics presented.  Learning Barriers/Preferences: Learning Barriers/Preferences - 08/17/19 1128      Learning Barriers/Preferences   Learning Barriers  None    Learning Preferences  Written Material;Skilled Demonstration;Verbal Instruction;Pictoral;Individual Instruction;Audio;Video       Education Topics: Risk Factor Reduction:  -Group instruction that is supported by a PowerPoint presentation. Instructor discusses the definition of a risk factor, different risk factors for pulmonary disease, and how the heart and lungs work together.     PULMONARY REHAB OTHER RESPIRATORY  from 10/06/2019 in [place]  Date  09/03/19  Educator  Handout      Nutrition for Pulmonary Patient:  -Group instruction provided by PowerPoint slides, verbal discussion, and written materials to support subject matter. The instructor gives an explanation and review of healthy diet recommendations, which includes a discussion on weight management, recommendations for fruit and vegetable consumption, as well as protein, fluid, caffeine, fiber, sodium, sugar, and alcohol. Tips for eating when patients are short of breath are discussed.   PULMONARY REHAB OTHER RESPIRATORY from 09/01/2019 in [place]  Date  09/01/19  Educator  Dietician  Instruction Review Code  -- [Handout]      Pursed Lip Breathing:  -Group instruction that is supported by demonstration and informational handouts. Instructor discusses the benefits of pursed lip and diaphragmatic breathing and detailed demonstration on how to preform  both.     PULMONARY REHAB OTHER RESPIRATORY from 10/06/2019 in [place]  Date  09/17/19  Educator  Handout      Oxygen Safety:  -Group instruction provided by PowerPoint, verbal discussion, and written material to support subject matter. There is an overview of "What is Oxygen" and "Why do we need it".  Instructor also reviews how to create a safe environment for oxygen use, the importance of using oxygen as prescribed, and the risks of noncompliance. There is a brief discussion on traveling with oxygen and resources the patient may utilize.   Oxygen Equipment:  -Group instruction provided by [facility] Staff utilizing handouts, written materials, and equipment demonstrations.   Signs and Symptoms:  -Group instruction provided by written material and verbal discussion to support subject matter. Warning signs and symptoms of infection, stroke, and heart attack are reviewed and when to call the  physician/911 reinforced. Tips for preventing the spread of infection discussed.   Advanced Directives:  -Group instruction provided by verbal instruction and written material to support subject matter. Instructor reviews Advanced Directive laws and proper instruction for filling out document.   Pulmonary Video:  -Group video education that reviews the importance of medication and oxygen compliance, exercise, good nutrition, pulmonary hygiene, and pursed lip and diaphragmatic breathing for the pulmonary patient.   Exercise for the Pulmonary Patient:  -Group instruction that is supported by a PowerPoint presentation. Instructor discusses benefits of exercise, core components of exercise, frequency, duration, and intensity of an exercise routine, importance of utilizing pulse oximetry during exercise, safety while exercising, and options of places to exercise outside of rehab.     Pulmonary Medications:  -Verbally interactive group education provided by instructor with focus on inhaled medications and proper administration.   Anatomy and Physiology of the Respiratory System and Intimacy:  -Group instruction provided by PowerPoint, verbal discussion, and written material to support subject matter. Instructor reviews respiratory cycle and anatomical components of the respiratory system and their functions. Instructor also reviews differences in obstructive and restrictive respiratory diseases with examples of each. Intimacy, Sex, and Sexuality differences are reviewed with a discussion on how relationships can change when diagnosed with pulmonary disease. Common sexual concerns are reviewed.   PULMONARY REHAB OTHER RESPIRATORY from 10/06/2019 in [place]  Date  10/06/19  Educator  DF  Instruction Review Code  2- Demonstrated Understanding      MD DAY -A group question and answer session with a medical doctor that allows participants to ask questions that  relate to their pulmonary disease state.   OTHER EDUCATION -Group or individual verbal, written, or video instructions that support the educational goals of the pulmonary rehab program.   PULMONARY REHAB OTHER RESPIRATORY from 10/06/2019 in [place]  Date  09/29/19 [[facility] [place]  Educator  [employer] your numbers]  Instruction Review Code  -- [na]      Holiday Eating Survival Tips:  -Group instruction provided by PowerPoint slides, verbal discussion, and written materials to support subject matter. The instructor gives patients tips, tricks, and techniques to help them not only survive but enjoy the holidays despite the onslaught of food that accompanies the holidays.   Knowledge Questionnaire Score: Knowledge Questionnaire Score - 08/17/19 1129      Knowledge Questionnaire Score   Pre Score  NA Heart failure       Core Components/Risk Factors/Patient Goals at Admission: Personal Goals and Risk Factors at  Admission - 08/17/19 1141      Core Components/Risk Factors/Patient Goals on Admission   Heart Failure  Yes    Intervention  Provide a combined exercise and nutrition program that is supplemented with education, support and counseling about heart failure. Directed toward relieving symptoms such as shortness of breath, decreased exercise tolerance, and extremity edema.    Expected Outcomes  Short term: Attendance in program 2-3 days a week with increased exercise capacity. Reported lower sodium intake. Reported increased fruit and vegetable intake. Reports medication compliance.;Short term: Daily weights obtained and reported for increase. Utilizing diuretic protocols set by physician.;Long term: Adoption of self-care skills and reduction of barriers for early signs and symptoms recognition and intervention leading to self-care maintenance.       Core Components/Risk Factors/Patient Goals Review:  Goals and Risk Factor Review    Row Name 08/17/19  1142 09/14/19 1246 10/13/19 0823         Core Components/Risk Factors/Patient Goals Review   Personal Goals Review  Heart Failure  Develop more efficient breathing techniques such as purse lipped breathing and diaphragmatic breathing and practicing self-pacing with activity.;Increase knowledge of respiratory medications and ability to use respiratory devices properly.;Improve shortness of breath with ADL's  Improve shortness of breath with ADL's;Develop more efficient breathing techniques such as purse lipped breathing and diaphragmatic breathing and practicing self-pacing with activity.;Increase knowledge of respiratory medications and ability to use respiratory devices properly.;Heart Failure     Review  --  Merve Hotard gives it her all when she exercises with [place], she has increased to level 3 on the nustep, and continues at level 1 on the arm ergomenter which is the more difficult piece of equipment.  Navaya Wiatrek has worked very hard while in pulmonary rehabm she has not missed a class and is up to level 5 on the nustepm and level 2 on the arm ergomenter, her met level on the nustep is 2.7-3.0.  She is a pleasure to have in the program.     Expected Outcomes  --  See admission goals.  See admission goals        Core Components/Risk Factors/Patient Goals at Discharge (Final Review):  Goals and Risk Factor Review - 10/13/19 0823      Core Components/Risk Factors/Patient Goals Review   Personal Goals Review  Improve shortness of breath with ADL's;Develop more efficient breathing techniques such as purse lipped breathing and diaphragmatic breathing and practicing self-pacing with activity.;Increase knowledge of respiratory medications and ability to use respiratory devices properly.;Heart Failure    Review  Icelyn Navarrete has worked very hard while in pulmonary rehabm she has not missed a class and is up to level 5 on the nustepm and level 2 on the arm ergomenter, her met level on the nustep is 2.7-3.0.  She  is a pleasure to have in the program.    Expected Outcomes  See admission goals       ITP Comments:   Comments: ITP REVIEW Pt is making expected progress toward pulmonary rehab goals after completing 15 sessions. Recommend continued exercise, life style modification, education, and utilization of breathing techniques to increase stamina and strength and decrease shortness of breath with exertion.

## 2019-10-15 ENCOUNTER — Encounter (HOSPITAL_COMMUNITY)
Admission: RE | Admit: 2019-10-15 | Discharge: 2019-10-15 | Disposition: A | Discharge status: Home / Self Care | Payer: Medicare Other | Source: Ambulatory Visit | Attending: Interventional Cardiology | Admitting: Interventional Cardiology

## 2019-10-15 ENCOUNTER — Other Ambulatory Visit: Payer: Self-pay

## 2019-10-15 DIAGNOSIS — I5032 Chronic diastolic (congestive) heart failure: Secondary | ICD-10-CM

## 2019-10-15 NOTE — Progress Notes (Signed)
Daily Session Note  Patient Details  Name: Lynn Brown MRN: [id number] Date of Birth: 01/30/1932 Referring Provider:     Pulmonary Rehab Walk Test from 08/17/2019 in [place]  Referring Provider  Dr. Tamala Julian      Encounter Date: 10/15/2019  Check In: Session Check In - 10/15/19 0958      Check-In   Supervising physician immediately available to respond to emergencies  [facility] immediately available    Physician(s)  Dr. Tyrell Antonio    Location  [facility]    Staff Present  Michaele Offer RD, LDN;Dalton Kris Mouton, MS, CEP, Exercise Physiologist;Lisa Ysidro Evert, RN    Virtual Visit  No    Medication changes reported      No    Fall or balance concerns reported     No    Tobacco Cessation  No Change    Warm-up and Cool-down  Performed on first and last piece of equipment    Resistance Training Performed  Yes    VAD Patient?  No    PAD/SET Patient?  No      Pain Assessment   Currently in Pain?  No/denies    Multiple Pain Sites  No       Capillary Blood Glucose: No results found for this or any previous visit (from the past 24 hour(s)).    Social History   Tobacco Use  Smoking Status Never Smoker  Smokeless Tobacco Never Used    Goals Met:  Exercise tolerated well No report of cardiac concerns or symptoms Strength training completed today  Goals Unmet:  Not Applicable  Comments: Service time is from 1000 to 1055    Dr. Fransico Him is Medical Director for Cardiac Rehab at [facility].

## 2019-10-20 ENCOUNTER — Encounter (HOSPITAL_COMMUNITY)
Admission: RE | Admit: 2019-10-20 | Discharge: 2019-10-20 | Disposition: A | Discharge status: Home / Self Care | Payer: Medicare Other | Source: Ambulatory Visit | Attending: Interventional Cardiology | Admitting: Interventional Cardiology

## 2019-10-20 ENCOUNTER — Telehealth: Payer: Self-pay | Admitting: Interventional Cardiology

## 2019-10-20 ENCOUNTER — Other Ambulatory Visit: Payer: Self-pay

## 2019-10-20 DIAGNOSIS — I5032 Chronic diastolic (congestive) heart failure: Secondary | ICD-10-CM

## 2019-10-20 NOTE — Telephone Encounter (Signed)
Patient is about to graduate from Pulmonary Rehab and she wants to join the Maintenance Program. Lynn Brown is calling to get orders from Dr. Katrinka Blazing for patient to attend program. Please advise.

## 2019-10-20 NOTE — Progress Notes (Signed)
Daily Session Note  Patient Details  Name: Lynn Brown MRN: [id number] Date of Birth: 08/12/1931 Referring Provider:     Pulmonary Rehab Walk Test from 08/17/2019 in [place]  Referring Provider  Dr. Tamala Julian      Encounter Date: 10/20/2019  Check In: Session Check In - 10/20/19 0955      Check-In   Supervising physician immediately available to respond to emergencies  [facility] immediately available    Physician(s)  Dr. Tyrell Antonio    Location  [facility]    Staff Present  Rosebud Poles, RN, Bjorn Loser, MS, CEP, Exercise Physiologist;Lisa Ysidro Evert, RN    Virtual Visit  No    Medication changes reported      No    Fall or balance concerns reported     No    Tobacco Cessation  No Change    Warm-up and Cool-down  Performed on first and last piece of equipment    Resistance Training Performed  Yes    VAD Patient?  No    PAD/SET Patient?  No      Pain Assessment   Currently in Pain?  No/denies    Multiple Pain Sites  No       Capillary Blood Glucose: No results found for this or any previous visit (from the past 24 hour(s)).    Social History   Tobacco Use  Smoking Status Never Smoker  Smokeless Tobacco Never Used    Goals Met:  Independence with exercise equipment Exercise tolerated well Strength training completed today  Goals Unmet:  Not Applicable  Comments: Service time is from 0955 to 38    Dr. Fransico Him is Medical Director for Cardiac Rehab at [facility].

## 2019-10-20 NOTE — Telephone Encounter (Signed)
Are you ok with pt doing the maintenance program with pulmonary rehab?

## 2019-10-22 ENCOUNTER — Encounter (HOSPITAL_COMMUNITY)
Admission: RE | Admit: 2019-10-22 | Discharge: 2019-10-22 | Disposition: A | Discharge status: Home / Self Care | Payer: Medicare Other | Source: Ambulatory Visit | Attending: Interventional Cardiology | Admitting: Interventional Cardiology

## 2019-10-22 ENCOUNTER — Other Ambulatory Visit: Payer: Self-pay

## 2019-10-22 DIAGNOSIS — I5032 Chronic diastolic (congestive) heart failure: Secondary | ICD-10-CM | POA: Diagnosis not present

## 2019-10-22 NOTE — Progress Notes (Signed)
Daily Session Note  Patient Details  Name: Lynn Brown MRN: [id number] Date of Birth: 06-Jul-1931 Referring Provider:     Pulmonary Rehab Walk Test from 08/17/2019 in [place]  Referring Provider  Dr. Tamala Julian      Encounter Date: 10/22/2019  Check In: Session Check In - 10/22/19 0956      Check-In   Supervising physician immediately available to respond to emergencies  [facility] immediately available    Physician(s)  Dr. Broadus John    Location  [facility]    Staff Present  Rosebud Poles, RN, Bjorn Loser, MS, CEP, Exercise Physiologist;Lisa Ysidro Evert, RN    Virtual Visit  No    Medication changes reported      No    Fall or balance concerns reported     No    Tobacco Cessation  No Change    Warm-up and Cool-down  Performed on first and last piece of equipment    Resistance Training Performed  Yes    VAD Patient?  No    PAD/SET Patient?  No      Pain Assessment   Currently in Pain?  No/denies    Multiple Pain Sites  No       Capillary Blood Glucose: No results found for this or any previous visit (from the past 24 hour(s)).    Social History   Tobacco Use  Smoking Status Never Smoker  Smokeless Tobacco Never Used    Goals Met:  Exercise tolerated well No report of cardiac concerns or symptoms Strength training completed today  Goals Unmet:  Not Applicable  Comments: Service time is from 0950 to 1100    Dr. Fransico Him is Medical Director for Cardiac Rehab at [facility].

## 2019-10-26 NOTE — Telephone Encounter (Signed)
Okay to attend maintenance program.

## 2019-10-27 NOTE — Telephone Encounter (Signed)
Spoke with Liborio Nixon at [employer].  Misty Stanley is on vacation this week but she will send her a message to let her know Dr. Katrinka Blazing said ok for her to do maintenance program. She will have Misty Stanley contact us if she needs anything further.

## 2019-11-04 NOTE — Telephone Encounter (Signed)
Lynn Brown at Cardiac Rehab was calling back following up on her request from 05/25. Please call back with orders

## 2019-11-04 NOTE — Telephone Encounter (Signed)
Spoke with Misty Stanley and she just needed the order put in.  Order placed for pulm rehab maintenance program.

## 2019-11-04 NOTE — Addendum Note (Signed)
Addended by: Julio Sicks on: 11/04/2019 09:59 AM   Modules accepted: Orders

## 2019-11-10 ENCOUNTER — Other Ambulatory Visit: Payer: Self-pay

## 2019-11-10 ENCOUNTER — Encounter (HOSPITAL_COMMUNITY)
Admission: RE | Admit: 2019-11-10 | Discharge: 2019-11-10 | Disposition: A | Discharge status: Home / Self Care | Payer: Medicare Other | Source: Ambulatory Visit | Attending: Interventional Cardiology | Admitting: Interventional Cardiology

## 2019-11-10 DIAGNOSIS — I5032 Chronic diastolic (congestive) heart failure: Secondary | ICD-10-CM | POA: Insufficient documentation

## 2019-11-10 NOTE — Progress Notes (Signed)
[facility] presented today for their first day of exercise at 9:45 maintenance.   Entrance Vitals Blood Pressure: 132/64 Heart Rate: 65 Oxygen Saturation: 99 RA  Exercising Vitals Blood Pressure: 110/60 Heart Rate: 86 Oxygen Saturation: 95 RA  Exit Vitals Blood Pressure: 102/70 Heart Rate: 88 Oxygen Saturation: 96 RA   Fall Risk: Yes  GOALS MET Exercise tolerated well No report of cardiac concerns or symptoms Strength training completed today  GOALS UNMET Not Applicable   Additional Comments:  Lynn Brown tolerated exercise well today. She does not have a pulse oximeter but she plans to get one. She is high risk for falls. We discussed fall risks and ways to prevent falls in the gym. I encouraged her to use caution with the equipment, to be mindful of her foot placement and to use the track and not to cut through the equipment. She voices understanding.

## 2019-11-12 ENCOUNTER — Other Ambulatory Visit: Payer: Self-pay

## 2019-11-12 ENCOUNTER — Encounter (HOSPITAL_COMMUNITY)
Admission: RE | Admit: 2019-11-12 | Discharge: 2019-11-12 | Disposition: A | Discharge status: Home / Self Care | Payer: Medicare Other | Source: Ambulatory Visit | Attending: Interventional Cardiology | Admitting: Interventional Cardiology

## 2019-11-16 NOTE — Addendum Note (Signed)
Encounter addended by: Drema Pry, RN on: 11/16/2019 9:02 AM  Actions taken: Clinical Note Signed, Episode resolved

## 2019-11-16 NOTE — Progress Notes (Signed)
Discharge Progress Report  Patient Details  Name: Lynn Brown MRN: [id number] Date of Birth: 11-Jan-1932 Referring Provider:     Pulmonary Rehab Walk Test from 08/17/2019 in [place]  Referring Provider Dr. Tamala Julian       Number of Visits: 18  Reason for Discharge:  Patient reached a stable level of exercise. Patient independent in their exercise. Patient has met program and personal goals.  Smoking History:  Social History   Tobacco Use  Smoking Status Never Smoker  Smokeless Tobacco Never Used    Diagnosis:  Heart failure, diastolic, chronic ([place])  ADL [facility]:  Pulmonary Assessment Scores    Row Name 08/17/19 1119 08/18/19 0924 10/22/19 1154     ADL [facility]   ADL Phase Entry Entry Exit   SOB Score total 52 -- --     CAT Score   CAT Score 17 -- --     mMRC Score   mMRC Score -- 3 2          Initial Exercise Prescription:  Initial Exercise Prescription - 08/18/19 0900      Date of Initial Exercise RX and Referring Provider   Date 08/18/19    Referring Provider Dr. Tamala Julian      NuStep   Level 2    SPM 80    Minutes 15      Arm Ergometer   Level 1    Minutes 15      Prescription Details   Frequency (times per week) 2    Duration Progress to 30 minutes of continuous aerobic without signs/symptoms of physical distress      Intensity   THRR 40-80% of Max Heartrate 54-108    Ratings of Perceived Exertion 11-13    Perceived Dyspnea 0-4      Progression   Progression Continue progressive overload as per policy without signs/symptoms or physical distress.      Resistance Training   Training Prescription Yes    Weight orange bands    Reps 10-15           Discharge Exercise Prescription (Final Exercise Prescription Changes):  Exercise Prescription Changes - 10/13/19 1100      Response to Exercise   Blood Pressure (Admit) 100/60    Blood Pressure (Exercise) 140/80    Blood Pressure (Exit) 110/60    Heart Rate  (Admit) 69 bpm    Heart Rate (Exercise) 97 bpm    Heart Rate (Exit) 79 bpm    Oxygen Saturation (Admit) 97 %    Oxygen Saturation (Exercise) 96 %    Oxygen Saturation (Exit) 95 %    Rating of Perceived Exertion (Exercise) 13    Perceived Dyspnea (Exercise) 1    Duration Continue with 30 min of aerobic exercise without signs/symptoms of physical distress.    Intensity THRR unchanged      Progression   Progression Continue to progress workloads to maintain intensity without signs/symptoms of physical distress.      Resistance Training   Training Prescription Yes    Weight orange bands    Reps 10-15    Time 10 Minutes      NuStep   Level 5    SPM 80    Minutes 15    METs 2.8      Arm Ergometer   Level 2    Minutes 15    METs 2.5           Functional Capacity:  6 Minute  Walk    Row Name 08/17/19 1156 10/22/19 1154       6 Minute Walk   Phase Initial Discharge    Distance 737 feet 905 feet    Distance % Change -- 22.08 %    Distance Feet Change -- 168 ft    Walk Time 6 minutes 6 minutes    # of Rest Breaks 0 0    MPH 1.4 1.71    METS 0.45  outlier in ACSM 6MWT equation due to high BMI, low distance, low HR, and low BP. 1.53    RPE 11 11    Perceived Dyspnea  0 1    VO2 Peak 1.57 5.35    Symptoms Yes (comment) Yes (comment)    Comments pushed wheel chair pushed wheel chair    Resting HR 54 bpm 80 bpm    Resting BP 104/60 108/60    Resting Oxygen Saturation  95 % 100 %    Exercise Oxygen Saturation  during 6 min walk 91 % 93 %    Max Ex. HR 86 bpm 113 bpm    Max Ex. BP 132/70 148/80    2 Minute Post BP 110/66 140/76      Interval HR   1 Minute HR 57 95    2 Minute HR 64 94    3 Minute HR 67 104    4 Minute HR 67 108    5 Minute HR 68 111    6 Minute HR 86 113    2 Minute Post HR 79 96    Interval Heart Rate? Yes Yes      Interval Oxygen   Interval Oxygen? Yes Yes    Baseline Oxygen Saturation % 95 % 100 %    1 Minute Oxygen Saturation % 96 % 95 %     1 Minute Liters of Oxygen 0 L 0 L    2 Minute Oxygen Saturation % 91 % 96 %    2 Minute Liters of Oxygen 0 L 0 L    3 Minute Oxygen Saturation % 93 % 93 %    3 Minute Liters of Oxygen 0 L 0 L    4 Minute Oxygen Saturation % 91 % 94 %    4 Minute Liters of Oxygen 0 L 0 L    5 Minute Oxygen Saturation % 92 % 94 %    5 Minute Liters of Oxygen 0 L 0 L    6 Minute Oxygen Saturation % 93 % 93 %    6 Minute Liters of Oxygen 0 L 0 L    2 Minute Post Oxygen Saturation % 97 % 98 %    2 Minute Post Liters of Oxygen 0 L 0 L           Psychological, QOL, Others - Outcomes: PHQ 2/9: Depression screen PHQ 2/9 08/17/2019  Decreased Interest 0  Down, Depressed, Hopeless 0  PHQ - 2 Score 0  Altered sleeping 0  Tired, decreased energy 0  Change in appetite 0  Feeling bad or failure about yourself  0  Trouble concentrating 0  Moving slowly or fidgety/restless 0  Suicidal thoughts 0  PHQ-9 Score 0  Difficult doing work/chores Not difficult at all    Quality of Life:   Personal Goals: Goals established at orientation with interventions provided to work toward goal.  Personal Goals and Risk Factors at Admission - 08/17/19 1141      Core Components/Risk Factors/Patient Goals on Admission  Heart Failure Yes    Intervention Provide a combined exercise and nutrition program that is supplemented with education, support and counseling about heart failure. Directed toward relieving symptoms such as shortness of breath, decreased exercise tolerance, and extremity edema.    Expected Outcomes Short term: Attendance in program 2-3 days a week with increased exercise capacity. Reported lower sodium intake. Reported increased fruit and vegetable intake. Reports medication compliance.;Short term: Daily weights obtained and reported for increase. Utilizing diuretic protocols set by physician.;Long term: Adoption of self-care skills and reduction of barriers for early signs and symptoms recognition and  intervention leading to self-care maintenance.            Personal Goals Discharge:  Goals and Risk Factor Review    Row Name 08/17/19 1142 09/14/19 1246 10/13/19 0823         Core Components/Risk Factors/Patient Goals Review   Personal Goals Review Heart Failure Develop more efficient breathing techniques such as purse lipped breathing and diaphragmatic breathing and practicing self-pacing with activity.;Increase knowledge of respiratory medications and ability to use respiratory devices properly.;Improve shortness of breath with ADL's Improve shortness of breath with ADL's;Develop more efficient breathing techniques such as purse lipped breathing and diaphragmatic breathing and practicing self-pacing with activity.;Increase knowledge of respiratory medications and ability to use respiratory devices properly.;Heart Failure     Review -- Blessin Kanno gives it her all when she exercises with [place], she has increased to level 3 on the nustep, and continues at level 1 on the arm ergomenter which is the more difficult piece of equipment. Ayleah Hofmeister has worked very hard while in pulmonary rehabm she has not missed a class and is up to level 5 on the nustepm and level 2 on the arm ergomenter, her met level on the nustep is 2.7-3.0.  She is a pleasure to have in the program.     Expected Outcomes -- See admission goals. See admission goals            Exercise Goals and Review:  Exercise Goals    Row Name 08/18/19 1610 09/15/19 0724 10/13/19 0658         Exercise Goals   Increase Physical Activity Yes Yes Yes     Intervention Provide advice, education, support and counseling about physical activity/exercise needs.;Develop an individualized exercise prescription for aerobic and resistive training based on initial evaluation findings, risk stratification, comorbidities and participant's personal goals. Provide advice, education, support and counseling about physical activity/exercise needs.;Develop an  individualized exercise prescription for aerobic and resistive training based on initial evaluation findings, risk stratification, comorbidities and participant's personal goals. Provide advice, education, support and counseling about physical activity/exercise needs.;Develop an individualized exercise prescription for aerobic and resistive training based on initial evaluation findings, risk stratification, comorbidities and participant's personal goals.     Expected Outcomes Short Term: Attend rehab on a regular basis to increase amount of physical activity.;Long Term: Add in home exercise to make exercise part of routine and to increase amount of physical activity.;Long Term: Exercising regularly at least 3-5 days a week. Short Term: Attend rehab on a regular basis to increase amount of physical activity.;Long Term: Add in home exercise to make exercise part of routine and to increase amount of physical activity.;Long Term: Exercising regularly at least 3-5 days a week. Short Term: Attend rehab on a regular basis to increase amount of physical activity.;Long Term: Add in home exercise to make exercise part of routine and to increase amount of physical activity.;Long Term:  Exercising regularly at least 3-5 days a week.     Increase Strength and Stamina Yes Yes Yes     Intervention Provide advice, education, support and counseling about physical activity/exercise needs.;Develop an individualized exercise prescription for aerobic and resistive training based on initial evaluation findings, risk stratification, comorbidities and participant's personal goals. Provide advice, education, support and counseling about physical activity/exercise needs.;Develop an individualized exercise prescription for aerobic and resistive training based on initial evaluation findings, risk stratification, comorbidities and participant's personal goals. Provide advice, education, support and counseling about physical activity/exercise  needs.;Develop an individualized exercise prescription for aerobic and resistive training based on initial evaluation findings, risk stratification, comorbidities and participant's personal goals.     Expected Outcomes Short Term: Increase workloads from initial exercise prescription for resistance, speed, and METs.;Short Term: Perform resistance training exercises routinely during rehab and add in resistance training at home;Long Term: Improve cardiorespiratory fitness, muscular endurance and strength as measured by increased METs and functional capacity (6MWT) Short Term: Increase workloads from initial exercise prescription for resistance, speed, and METs.;Short Term: Perform resistance training exercises routinely during rehab and add in resistance training at home;Long Term: Improve cardiorespiratory fitness, muscular endurance and strength as measured by increased METs and functional capacity (6MWT) Short Term: Increase workloads from initial exercise prescription for resistance, speed, and METs.;Short Term: Perform resistance training exercises routinely during rehab and add in resistance training at home;Long Term: Improve cardiorespiratory fitness, muscular endurance and strength as measured by increased METs and functional capacity (6MWT)     Able to understand and use rate of perceived exertion (RPE) scale Yes Yes Yes     Intervention Provide education and explanation on how to use RPE scale Provide education and explanation on how to use RPE scale Provide education and explanation on how to use RPE scale     Expected Outcomes Long Term:  Able to use RPE to guide intensity level when exercising independently;Short Term: Able to use RPE daily in rehab to express subjective intensity level Long Term:  Able to use RPE to guide intensity level when exercising independently;Short Term: Able to use RPE daily in rehab to express subjective intensity level Long Term:  Able to use RPE to guide intensity level  when exercising independently;Short Term: Able to use RPE daily in rehab to express subjective intensity level     Able to understand and use Dyspnea scale Yes Yes Yes     Intervention Provide education and explanation on how to use Dyspnea scale Provide education and explanation on how to use Dyspnea scale Provide education and explanation on how to use Dyspnea scale     Expected Outcomes Short Term: Able to use Dyspnea scale daily in rehab to express subjective sense of shortness of breath during exertion;Long Term: Able to use Dyspnea scale to guide intensity level when exercising independently Short Term: Able to use Dyspnea scale daily in rehab to express subjective sense of shortness of breath during exertion;Long Term: Able to use Dyspnea scale to guide intensity level when exercising independently Short Term: Able to use Dyspnea scale daily in rehab to express subjective sense of shortness of breath during exertion;Long Term: Able to use Dyspnea scale to guide intensity level when exercising independently     Knowledge and understanding of Target Heart Rate Range (THRR) Yes Yes Yes     Intervention Provide education and explanation of THRR including how the numbers were predicted and where they are located for reference Provide education and explanation of THRR  including how the numbers were predicted and where they are located for reference Provide education and explanation of THRR including how the numbers were predicted and where they are located for reference     Expected Outcomes Short Term: Able to state/look up THRR;Short Term: Able to use daily as guideline for intensity in rehab;Long Term: Able to use THRR to govern intensity when exercising independently Short Term: Able to state/look up THRR;Short Term: Able to use daily as guideline for intensity in rehab;Long Term: Able to use THRR to govern intensity when exercising independently Short Term: Able to state/look up THRR;Short Term: Able to  use daily as guideline for intensity in rehab;Long Term: Able to use THRR to govern intensity when exercising independently     Understanding of Exercise Prescription Yes Yes Yes     Intervention Provide education, explanation, and written materials on patient's individual exercise prescription Provide education, explanation, and written materials on patient's individual exercise prescription Provide education, explanation, and written materials on patient's individual exercise prescription     Expected Outcomes Short Term: Able to explain program exercise prescription;Long Term: Able to explain home exercise prescription to exercise independently Short Term: Able to explain program exercise prescription;Long Term: Able to explain home exercise prescription to exercise independently Short Term: Able to explain program exercise prescription;Long Term: Able to explain home exercise prescription to exercise independently            Exercise Goals Re-Evaluation:  Exercise Goals Re-Evaluation    Row Name 09/15/19 0724 10/13/19 0659           Exercise Goal Re-Evaluation   Exercise Goals Review Increase Physical Activity;Increase Strength and Stamina;Able to understand and use rate of perceived exertion (RPE) scale;Able to understand and use Dyspnea scale;Knowledge and understanding of Target Heart Rate Range (THRR);Understanding of Exercise Prescription Increase Physical Activity;Increase Strength and Stamina;Able to understand and use rate of perceived exertion (RPE) scale;Able to understand and use Dyspnea scale;Knowledge and understanding of Target Heart Rate Range (THRR);Understanding of Exercise Prescription      Comments Pt has completed 6 exercise sessions. She is progressing well and is highly motivated. Her upper body has gotten noticeably stronger from the arm ergometer. She currently exercises at 2.2 METs on the stepper. I expect her to continue to progress. Will continue to monitor and  progress as able. Pt has completed 14 exercise sessions. She is still progressing well and remains motivated. She currently exercises at 2.8 METs on the stepper and has been able to increase the resistance on the arm ergometer. Will continue to monitor and progress as able.      Expected Outcomes Through exercise at rehab and at home, the patient will decrease shortness of breath with daily activities and feel confident in carrying out an exercise regime at home. Through exercise at rehab and at home, the patient will decrease shortness of breath with daily activities and feel confident in carrying out an exercise regime at home.             Nutrition & Weight - Outcomes:  Pre Biometrics - 08/17/19 1118      Pre Biometrics   Height 5' 1.25" (1.556 m)    Weight 87 kg    BMI (Calculated) 35.93    Grip Strength 22 kg            Nutrition:  Nutrition Therapy & Goals - 09/01/19 1135      Nutrition Therapy   Diet Heart Healthy/low sodium  Personal Nutrition Goals   Nutrition Goal Pt to build a healthy plate including vegetables, fruits, whole grains, and low-fat dairy products in a heart healthy meal plan.      Intervention Plan   Intervention Prescribe, educate and counsel regarding individualized specific dietary modifications aiming towards targeted core components such as weight, hypertension, lipid management, diabetes, heart failure and other comorbidities.    Expected Outcomes Short Term Goal: A plan has been developed with personal nutrition goals set during dietitian appointment.;Long Term Goal: Adherence to prescribed nutrition plan.           Nutrition Discharge:  Nutrition Assessments - 08/27/19 1135      Rate Your Plate Scores   Pre Score 68           Education Questionnaire Score:  Knowledge Questionnaire Score - 08/17/19 1129      Knowledge Questionnaire Score   Pre Score NA Heart failure           Goals reviewed with patient; copy given to  patient.

## 2019-11-17 ENCOUNTER — Encounter (HOSPITAL_COMMUNITY)
Admission: RE | Admit: 2019-11-17 | Discharge: 2019-11-17 | Disposition: A | Discharge status: Home / Self Care | Payer: Medicare Other | Source: Ambulatory Visit | Attending: Interventional Cardiology | Admitting: Interventional Cardiology

## 2019-11-17 ENCOUNTER — Other Ambulatory Visit: Payer: Self-pay

## 2019-11-19 ENCOUNTER — Encounter (HOSPITAL_COMMUNITY)
Admission: RE | Admit: 2019-11-19 | Discharge: 2019-11-19 | Disposition: A | Discharge status: Home / Self Care | Payer: Medicare Other | Source: Ambulatory Visit | Attending: Interventional Cardiology | Admitting: Interventional Cardiology

## 2019-11-19 ENCOUNTER — Other Ambulatory Visit: Payer: Self-pay

## 2019-11-24 ENCOUNTER — Other Ambulatory Visit: Payer: Self-pay

## 2019-11-24 ENCOUNTER — Encounter (HOSPITAL_COMMUNITY)
Admission: RE | Admit: 2019-11-24 | Discharge: 2019-11-24 | Disposition: A | Discharge status: Home / Self Care | Payer: Medicare Other | Source: Ambulatory Visit | Attending: Interventional Cardiology | Admitting: Interventional Cardiology

## 2019-11-26 ENCOUNTER — Other Ambulatory Visit: Payer: Self-pay

## 2019-11-26 ENCOUNTER — Encounter (HOSPITAL_COMMUNITY)
Admission: RE | Admit: 2019-11-26 | Discharge: 2019-11-26 | Disposition: A | Discharge status: Home / Self Care | Payer: Medicare Other | Source: Ambulatory Visit | Attending: Interventional Cardiology | Admitting: Interventional Cardiology

## 2019-11-26 DIAGNOSIS — I5032 Chronic diastolic (congestive) heart failure: Secondary | ICD-10-CM | POA: Insufficient documentation

## 2019-12-01 ENCOUNTER — Other Ambulatory Visit: Payer: Self-pay

## 2019-12-01 ENCOUNTER — Encounter (HOSPITAL_COMMUNITY)
Admission: RE | Admit: 2019-12-01 | Discharge: 2019-12-01 | Disposition: A | Discharge status: Home / Self Care | Payer: Medicare Other | Source: Ambulatory Visit | Attending: Interventional Cardiology | Admitting: Interventional Cardiology

## 2019-12-03 ENCOUNTER — Telehealth (HOSPITAL_COMMUNITY): Payer: Self-pay | Admitting: Family Medicine

## 2019-12-03 ENCOUNTER — Encounter (HOSPITAL_COMMUNITY): Payer: Medicare Other

## 2019-12-08 ENCOUNTER — Encounter (HOSPITAL_COMMUNITY)
Admission: RE | Admit: 2019-12-08 | Discharge: 2019-12-08 | Disposition: A | Discharge status: Home / Self Care | Payer: Medicare Other | Source: Ambulatory Visit | Attending: Interventional Cardiology | Admitting: Interventional Cardiology

## 2019-12-08 ENCOUNTER — Other Ambulatory Visit: Payer: Self-pay

## 2019-12-10 ENCOUNTER — Encounter (HOSPITAL_COMMUNITY): Payer: Self-pay | Admitting: *Deleted

## 2019-12-10 ENCOUNTER — Encounter (HOSPITAL_COMMUNITY): Payer: Medicare Other

## 2019-12-10 NOTE — Progress Notes (Signed)
Lynn Brown had to drop from the Pulmonary Rehab Maintenance program due to not having transportation. Her daughter was bring her but her work hours have changed and she will no longer be able to. Her last day was 12/08/2019. She has tolerated  exercise well.

## 2019-12-14 ENCOUNTER — Telehealth: Payer: Self-pay | Admitting: Interventional Cardiology

## 2019-12-14 NOTE — Telephone Encounter (Signed)
Spoke with pt and she denies any increased salt in her diet the night before.  Advised BPs look much better today.  Advised her if BP gets back up 3-5 days in a row, contact our office and if on the weekend, can still call our number and get the on call service.  Advised not to take extra Metoprolol anymore as it drops the HR as well.  Pt verbalized understanding and was appreciative for call.

## 2019-12-14 NOTE — Telephone Encounter (Signed)
Pt c/o BP issue: STAT if pt c/o blurred vision, one-sided weakness or slurred speech  1. What are your last 5 BP readings?  12/13/19 152/86, 153/92, 166/91 12/14/19 132/77 HR 56, 137/82 HR 60  2. Are you having any other symptoms (ex. Dizziness, headache, blurred vision, passed out)? No   3. What is your BP issue? Mark from [facility] is calling with the pt on the line to inform Dr. Katrinka Blazing that Ivar Drape reported experiencing high BP yesterday.  He states Luretha advised him that she took a second dose of metoprolol when this occurred on Sunday, but Loraine Leriche states that is not something she has been advised to do by a Doctor to do. Loraine Leriche is requesting a nurse to call [facility] to discuss how Dr. Katrinka Blazing would like her to proceed when high BP occurs. Please advise.

## 2019-12-15 ENCOUNTER — Encounter (HOSPITAL_COMMUNITY): Payer: Medicare Other

## 2019-12-17 ENCOUNTER — Encounter (HOSPITAL_COMMUNITY): Payer: Medicare Other

## 2019-12-22 ENCOUNTER — Encounter (HOSPITAL_COMMUNITY): Payer: Medicare Other

## 2019-12-24 ENCOUNTER — Encounter (HOSPITAL_COMMUNITY): Payer: Medicare Other

## 2019-12-29 ENCOUNTER — Other Ambulatory Visit: Payer: Self-pay | Admitting: Family Medicine

## 2019-12-29 ENCOUNTER — Encounter (HOSPITAL_COMMUNITY): Payer: Medicare Other

## 2019-12-29 DIAGNOSIS — M810 Age-related osteoporosis without current pathological fracture: Secondary | ICD-10-CM

## 2019-12-31 ENCOUNTER — Encounter (HOSPITAL_COMMUNITY): Payer: Medicare Other

## 2020-01-12 ENCOUNTER — Telehealth: Payer: Self-pay | Admitting: *Deleted

## 2020-01-12 NOTE — Telephone Encounter (Signed)
Okay I think it is okay to wait the extra month or so until we proceed with the DEXA

## 2020-01-12 NOTE — Telephone Encounter (Signed)
-----   Message from Jerilynn Mages sent at 01/05/2020  3:47 PM EDT ----- Regarding: dexa referral Tammy from [place] [phone] states a referral was sent for patient to have dexa done here in the office. Dr Audie Box had suggested prolia but patient did not follow up with [facility] for that. [place] would like bone density done here as soon as possible. Thx

## 2020-01-12 NOTE — Telephone Encounter (Signed)
Just FYI) Dr.Kendall (follow up from below) I called patient to discuss the below, patient is scheduled with you for annual exam on 02/18/20. However PCP (Dr.White)  is requesting Dexa. I explained to patient what PCP recommended and also discussed Prolia with patient. She would prefer to hold off and everything and discuss with you at annual exam, last dexa 12/2017.

## 2020-02-04 NOTE — Progress Notes (Signed)
Cardiology Office Note:    Date:  02/05/2020   ID:  Lynn Brown, [place] 07/30/1931, MRN [id number]  PCP:  Laurann MontanaWhite, Cynthia, MD  Cardiologist:  Lesleigh NoeHenry W Shamiyah Ngu III, MD   Referring MD: Laurann MontanaWhite, Cynthia, MD   Chief Complaint  Patient presents with  . Irregular Heart Beat    History of Present Illness:    Lynn DakinsWillie Mae Brown is a 86 y.o. female with a hx of hypertension, prior history of nephrectomy as a kidney donor, hyperlipidemia, chronic lower extremity edema, hyperlipidemia, hypothyroidism,"leaky heart valve",LAD aneurysm/circumflex ectasia/RCA ectasia. 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  Eliquis was started as therapy for coronary microembolization from aneurysm.  This medication can be sacrificed and aspirin substituted if needed.  There are no complaints.  No bleeding on Eliquis.  Does have lower extremity swelling.  She spends a lot of time sitting with legs dependent.  Leg swelling is better each morning but swell significantly during the day.  The daughter states that she will not elevate her legs when seated.  No chest pain, orthopnea, or PND.  Does have exertional fatigue with some dyspnea.  Past Medical History:  Diagnosis Date  . Allergic rhinitis   . Anxiety   . Bunion    PES PLANUS  . Cervical disc disease   . Hearing loss    HEARING AIDS, BUT DOES NOT WEAR THEM CONSISTENTLY.   Marland Kitchen. Hypercholesteremia   . Hypertension   . Hypothyroid   . Leaky heart valve    DR. HARWANI  . Mild obesity   . Osteoarthritis of knees, bilateral   . Osteoporosis 12/2017   T score -3.3  . Venous insufficiency     Past Surgical History:  Procedure Laterality Date  . CATARACT EXTRACTION, BILATERAL  2013   DR. SHAPIRO   . HYSTEROSCOPY     D & C  . LEFT HEART CATH AND CORONARY ANGIOGRAPHY N/A 03/10/2018   Procedure: LEFT HEART CATH AND CORONARY ANGIOGRAPHY;  Surgeon: Lyn RecordsSmith, Cadarius Nevares W, MD;  Location: [facility];  Service: Cardiovascular;   Laterality: N/A;  . NEPHRECTOMY LIVING DONOR  1978    Current Medications: Current Meds  Medication Sig  . amLODipine (NORVASC) 2.5 MG tablet Take 1 tablet (2.5 mg total) by mouth daily.  . calcium citrate-vitamin D (CITRACAL+D) 315-200 MG-UNIT per tablet Take 1 tablet by mouth daily.   . cetirizine (ZYRTEC) 10 MG tablet Take 10 mg by mouth daily.  . Cholecalciferol (VITAMIN D) 2000 UNITS tablet Take 2,000 Units by mouth daily.    . clorazepate (TRANXENE) 7.5 MG tablet Take 7.5 mg by mouth as needed for anxiety.  . Coenzyme Q10 (CO Q 10 PO) Take 1 tablet by mouth daily.  Marland Kitchen. levothyroxine (SYNTHROID) 112 MCG tablet Take 112 mcg by mouth every morning.  . magnesium oxide (MAG-OX) 400 MG tablet Take 400 mg by mouth daily.  . metoprolol tartrate (LOPRESSOR) 25 MG tablet Take 0.5 tablets (12.5 mg total) by mouth 2 (two) times daily.  . nitroGLYCERIN (NITROSTAT) 0.4 MG SL tablet Place 1 tablet (0.4 mg total) under the tongue every 5 (five) minutes as needed for chest pain.  . Omega-3 Fatty Acids (FISH OIL PO) Take 1 tablet by mouth daily.  . rosuvastatin (CRESTOR) 20 MG tablet Take 20 mg by mouth daily.  Marland Kitchen. spironolactone (ALDACTONE) 25 MG tablet TAKE 1 TABLET BY MOUTH  DAILY  . [DISCONTINUED] apixaban (ELIQUIS) 5 MG TABS tablet Take 1 tablet (5 mg total) by  mouth 2 (two) times daily.     Allergies:   Patient has no known allergies.   Social History   Socioeconomic History  . Marital status: Widowed    Spouse name: Not on file  . Number of children: 4  . Years of education: Not on file  . Highest education level: Not on file  Occupational History  . Occupation: RETIRED  Tobacco Use  . Smoking status: Never Smoker  . Smokeless tobacco: Never Used  Vaping Use  . Vaping Use: Never used  Substance and Sexual Activity  . Alcohol use: No    Alcohol/week: 0.0 standard drinks  . Drug use: No  . Sexual activity: Never    Comment: 1st intercourse 86 yo-Fewer than 5 partners  Other Topics  Concern  . Not on file  Social History Narrative  . Not on file   Social Determinants of Health   Financial Resource Strain:   . Difficulty of Paying Living Expenses: Not on file  Food Insecurity:   . Worried About Programme researcher, broadcasting/film/video in the Last Year: Not on file  . Ran Out of Food in the Last Year: Not on file  Transportation Needs:   . Lack of Transportation (Medical): Not on file  . Lack of Transportation (Non-Medical): Not on file  Physical Activity:   . Days of Exercise per Week: Not on file  . Minutes of Exercise per Session: Not on file  Stress:   . Feeling of Stress : Not on file  Social Connections:   . Frequency of Communication with Friends and Family: Not on file  . Frequency of Social Gatherings with Friends and Family: Not on file  . Attends Religious Services: Not on file  . Active Member of Clubs or Organizations: Not on file  . Attends Banker Meetings: Not on file  . Marital Status: Not on file     Family History: The patient's family history includes CVA in her brother, father, and mother; Cancer in her brother; Diabetes in her sister; Heart attack in her sister; Heart disease in her sister; Hypertension in her brother, brother, father, mother, and sister; Kidney disease in her sister; Kidney failure in her sister; Multiple sclerosis in her sister; Other in her brother; Pancreatic cancer in her sister; Seizures in her brother. There is no history of Breast cancer.  ROS:   Please see the history of present illness.    Lower extremity edema with dermatitis secondary to who stasis.  All other systems reviewed and are negative.  EKGs/Labs/Other Studies Reviewed:    The following studies were reviewed today: No new data  EKG:  EKG normal sinus rhythm, left axis deviation.  No significant abnormality.  No change when compared to prior.  No changes noted when compared to February 2020  Recent Labs: No results found for requested labs within last  8760 hours.  Recent Lipid Panel No results found for: CHOL, TRIG, HDL, CHOLHDL, VLDL, LDLCALC, LDLDIRECT  Physical Exam:    VS:  BP 116/72   Pulse 71   Ht 5\' 4"  (1.626 m)   Wt 189 lb 9.6 oz (86 kg)   SpO2 95%   BMI 32.54 kg/m     Wt Readings from Last 3 Encounters:  02/05/20 189 lb 9.6 oz (86 kg)  10/13/19 191 lb 12.8 oz (87 kg)  09/29/19 192 lb 0.3 oz (87.1 kg)     GEN: Elderly but appears younger than stated age. No acute distress HEENT:  Normal NECK: No JVD. LYMPHATICS: No lymphadenopathy CARDIAC:  RRR without murmur, gallop, or edema. VASCULAR:  Normal Pulses. No bruits. RESPIRATORY:  Clear to auscultation without rales, wheezing or rhonchi  ABDOMEN: Soft, non-tender, non-distended, No pulsatile mass, MUSCULOSKELETAL: No deformity  SKIN: Warm and dry NEUROLOGIC:  Alert and oriented x 3 PSYCHIATRIC:  Normal affect   ASSESSMENT:    1. PSVT (paroxysmal supraventricular tachycardia) (HCC)   2. Chronic diastolic heart failure (HCC)   3. Essential hypertension   4. Hypercholesteremia   5. Educated about COVID-19 virus infection   6. Chronic anticoagulation    PLAN:    In order of problems listed above:  1. No complaints of palpitations. 2. Consider SGLT2 therapy 3. Excellent blood pressure control.  Continue Aldactone, Lopressor, and Norvasc. 4. Crestor 20 mg/day will be continued.  LDL was 113 in August.  Would not further increase intensity. 5. She is vaccinated against COVID-19.  She is planning to get a booster shots. 6. Discontinue Eliquis and start aspirin 81 mg/day to decrease long-term bleeding risk.  Eliquis was initially started as management for coronary embolization secondary to coronary aneurysm (presumed etiology of her non-ST elevation MI).   Medication Adjustments/Labs and Tests Ordered: Current medicines are reviewed at length with the patient today.  Concerns regarding medicines are outlined above.  Orders Placed This Encounter  Procedures  .  EKG 12-Lead   Meds ordered this encounter  Medications  . aspirin EC 81 MG tablet    Sig: Take 1 tablet (81 mg total) by mouth daily. Swallow whole.    Dispense:  90 tablet    Refill:  3    Patient Instructions  Medication Instructions:  1) DISCONTINUE Eliquis 2) START Aspirin 81mg  once daily  *If you need a refill on your cardiac medications before your next appointment, please call your pharmacy*   Lab Work: None If you have labs (blood work) drawn today and your tests are completely normal, you will receive your results only by: MyChart Message (if you have MyChart) OR . A paper copy in the mail If you have any lab test that is abnormal or we need to change your treatment, we will call you to review the results.   Testing/Procedures: None   Follow-Up: At [facility], you and your health needs are our priority.  As part of our continuing mission to provide you with exceptional heart care, we have created designated Provider Care Teams.  These Care Teams include your primary Cardiologist (physician) and Advanced Practice Providers (APPs -  Physician Assistants and Nurse Practitioners) who all work together to provide you with the care you need, when you need it.  We recommend signing up for the patient portal called "MyChart".  Sign up information is provided on this After Visit Summary.  MyChart is used to connect with patients for Virtual Visits (Telemedicine).  Patients are able to view lab/test results, encounter notes, upcoming appointments, etc.  Non-urgent messages can be sent to your provider as well.   To learn more about what you can do with MyChart, go to [facility].    Your next appointment:   12 month(s)  The format for your next appointment:   In Person  Provider:   You may see [email or web address], MD or one of the following Advanced Practice Providers on your designated Care Team:    Lesleigh Noe, NP  Norma Fredrickson, NP  Nada Boozer,  NP    Other Instructions  Signed, Lesleigh Noe, MD  02/05/2020 9:55 AM    [facility]

## 2020-02-05 ENCOUNTER — Ambulatory Visit: Payer: Medicare Other | Admitting: Interventional Cardiology

## 2020-02-05 ENCOUNTER — Encounter: Payer: Self-pay | Admitting: Interventional Cardiology

## 2020-02-05 ENCOUNTER — Other Ambulatory Visit: Payer: Self-pay

## 2020-02-05 VITALS — BP 116/72 | HR 71 | Ht 64.0 in | Wt 189.6 lb

## 2020-02-05 DIAGNOSIS — I10 Essential (primary) hypertension: Secondary | ICD-10-CM

## 2020-02-05 DIAGNOSIS — Z7189 Other specified counseling: Secondary | ICD-10-CM

## 2020-02-05 DIAGNOSIS — I5032 Chronic diastolic (congestive) heart failure: Secondary | ICD-10-CM | POA: Diagnosis not present

## 2020-02-05 DIAGNOSIS — E7800 Pure hypercholesterolemia, unspecified: Secondary | ICD-10-CM | POA: Diagnosis not present

## 2020-02-05 DIAGNOSIS — I471 Supraventricular tachycardia, unspecified: Secondary | ICD-10-CM

## 2020-02-05 DIAGNOSIS — Z7901 Long term (current) use of anticoagulants: Secondary | ICD-10-CM

## 2020-02-05 MED ORDER — ASPIRIN EC 81 MG PO TBEC: 81.0000 mg | DELAYED_RELEASE_TABLET | Freq: Every day | ORAL | 3 refills | Status: DC

## 2020-02-05 NOTE — Patient Instructions (Signed)
Medication Instructions:  1) DISCONTINUE Eliquis 2) START Aspirin 81mg  once daily  *If you need a refill on your cardiac medications before your next appointment, please call your pharmacy*   Lab Work: None If you have labs (blood work) drawn today and your tests are completely normal, you will receive your results only by: MyChart Message (if you have MyChart) OR . A paper copy in the mail If you have any lab test that is abnormal or we need to change your treatment, we will call you to review the results.   Testing/Procedures: None   Follow-Up: At [facility], you and your health needs are our priority.  As part of our continuing mission to provide you with exceptional heart care, we have created designated Provider Care Teams.  These Care Teams include your primary Cardiologist (physician) and Advanced Practice Providers (APPs -  Physician Assistants and Nurse Practitioners) who all work together to provide you with the care you need, when you need it.  We recommend signing up for the patient portal called "MyChart".  Sign up information is provided on this After Visit Summary.  MyChart is used to connect with patients for Virtual Visits (Telemedicine).  Patients are able to view lab/test results, encounter notes, upcoming appointments, etc.  Non-urgent messages can be sent to your provider as well.   To learn more about what you can do with MyChart, go to [facility].    Your next appointment:   12 month(s)  The format for your next appointment:   In Person  Provider:   You may see [email or web address], MD or one of the following Advanced Practice Providers on your designated Care Team:    Lesleigh Noe, NP  Norma Fredrickson, NP  Nada Boozer, NP    Other Instructions

## 2020-02-10 ENCOUNTER — Other Ambulatory Visit: Payer: Self-pay | Admitting: Family Medicine

## 2020-02-10 DIAGNOSIS — Z1231 Encounter for screening mammogram for malignant neoplasm of breast: Secondary | ICD-10-CM

## 2020-02-11 ENCOUNTER — Other Ambulatory Visit: Payer: Self-pay | Admitting: Interventional Cardiology

## 2020-02-18 ENCOUNTER — Telehealth: Payer: Self-pay | Admitting: *Deleted

## 2020-02-18 ENCOUNTER — Other Ambulatory Visit: Payer: Self-pay

## 2020-02-18 ENCOUNTER — Ambulatory Visit: Payer: Medicare Other | Admitting: Obstetrics and Gynecology

## 2020-02-18 ENCOUNTER — Encounter: Payer: Self-pay | Admitting: Obstetrics and Gynecology

## 2020-02-18 VITALS — BP 124/82 | Ht 60.0 in | Wt 189.0 lb

## 2020-02-18 DIAGNOSIS — Z01419 Encounter for gynecological examination (general) (routine) without abnormal findings: Secondary | ICD-10-CM

## 2020-02-18 DIAGNOSIS — M810 Age-related osteoporosis without current pathological fracture: Secondary | ICD-10-CM

## 2020-02-18 DIAGNOSIS — N8189 Other female genital prolapse: Secondary | ICD-10-CM

## 2020-02-18 LAB — CALCIUM: Calcium: 9.9 mg/dL (ref 8.6–10.4)

## 2020-02-18 NOTE — Telephone Encounter (Signed)
Prolia insurance verification has been sent awaiting Summary of benefits  

## 2020-02-18 NOTE — Progress Notes (Signed)
Lynn Brown 1931-07-01 [id number]  SUBJECTIVE:  86 y.o. [id number] female here for a breast and pelvic exam. She has no gynecologic concerns.  Current Outpatient Medications  Medication Sig Dispense Refill  . amLODipine (NORVASC) 2.5 MG tablet TAKE 1 TABLET BY MOUTH  DAILY 90 tablet 3  . aspirin EC 81 MG tablet Take 1 tablet (81 mg total) by mouth daily. Swallow whole. 90 tablet 3  . calcium citrate-vitamin D (CITRACAL+D) 315-200 MG-UNIT per tablet Take 1 tablet by mouth daily.     . cetirizine (ZYRTEC) 10 MG tablet Take 10 mg by mouth daily.    . Cholecalciferol (VITAMIN D) 2000 UNITS tablet Take 2,000 Units by mouth daily.      . clorazepate (TRANXENE) 7.5 MG tablet Take 7.5 mg by mouth as needed for anxiety.    . Coenzyme Q10 (CO Q 10 PO) Take 1 tablet by mouth daily.    Marland Kitchen levothyroxine (SYNTHROID) 112 MCG tablet Take 112 mcg by mouth every morning.    . magnesium oxide (MAG-OX) 400 MG tablet Take 400 mg by mouth daily.    . metoprolol tartrate (LOPRESSOR) 25 MG tablet Take 0.5 tablets (12.5 mg total) by mouth 2 (two) times daily. 60 tablet 0  . nitroGLYCERIN (NITROSTAT) 0.4 MG SL tablet Place 1 tablet (0.4 mg total) under the tongue every 5 (five) minutes as needed for chest pain. 30 tablet 0  . Omega-3 Fatty Acids (FISH OIL PO) Take 1 tablet by mouth daily.    . rosuvastatin (CRESTOR) 20 MG tablet Take 20 mg by mouth daily.    Marland Kitchen spironolactone (ALDACTONE) 25 MG tablet TAKE 1 TABLET BY MOUTH  DAILY 90 tablet 1   No current facility-administered medications for this visit.   Allergies: Patient has no known allergies.  No LMP recorded. Patient is postmenopausal.  Past medical history,surgical history, problem list, medications, allergies, family history and social history were all reviewed and documented as reviewed in the EPIC chart.  GYN ROS: no abnormal bleeding, pelvic pain or discharge, no breast pain or new or enlarging lumps on self exam.  No dysuria, urinary frequency, pain  with urination, cloudy/malodorous urine.   OBJECTIVE:  BP 124/82   Ht 5' (1.524 m)   Wt 189 lb (85.7 kg)   BMI 36.91 kg/m  The patient appears well, alert, oriented, in no distress.  BREAST EXAM: breasts appear normal, no suspicious masses, no skin or nipple changes or axillary nodes  PELVIC EXAM: VULVA: normal appearing vulva with atrophic change, no masses, tenderness or lesions, VAGINA: normal appearing vagina with atrophic change, normal color and discharge, no lesions, mild rectocele and cystocele, CERVIX: normal appearing atrophic cervix without discharge or lesions, UTERUS: uterus is normal size, shape, consistency and nontender, ADNEXA: normal adnexa in size, nontender and no masses  Chaperone: Fuller Song (DNP student) present during the examination and performed the pelvic exam with me in attendance to confirm the exam findings   ASSESSMENT:  86 y.o. [id number] here for a breast and pelvic exam  PLAN:   1. Postmenopausal.  No significant hot flashes or night sweats.  No vaginal bleeding. 2. Pap smear less than 2009 and she does not need to continue screening per the current screening guidelines based on age criteria and no significant history of abnormal Pap smears.  3. Mild pelvic relaxation is asymptomatic the patient. 4. Mammogram appears to be scheduled 03/31/2020. 5. Colonoscopy reported in 2018. 6.  Osteoporosis.  DEXA 2019 with T score -3.3.  Judging from previous notes it looks like she never came back to discuss treatment options as was recommended.  She was on Boniva number of years ago.  Treatment was recommended by Dr. Velvet Bathe at her visit last year and bisphosphonates were discussed as was Prolia.  She never did follow-up or initiate treatment.  We reviewed treatment options today and she does want to proceed.  She would prefer the Prolia injection.  I discussed the risks and side effects to include osteonecrosis of the jaw, atypical fractures, infections and rashes.  The  patient agrees to go ahead and start the Prolia and we will go ahead and make arrangements for that.  We will continue this every 6 months, we will check calcium level today. 7. Health maintenance.  No other routine labs today (just Ca++) as she normally has these completed with her primary care doctor.  Return annually or sooner, prn.  Theresia Majors MD 02/18/20

## 2020-02-23 NOTE — Telephone Encounter (Signed)
Deductible n/a  OOP MAX $3600 ($459.27)  Annual exam 02/18/2020  Calcium 9.9             Date 02/18/2020  Upcoming dental procedures NO  Prior Authorization needed no  Pt estimated Cost $243   appt 03/09/2020    Coverage Details:20% of one dose, $35 admin fee

## 2020-03-09 ENCOUNTER — Other Ambulatory Visit: Payer: Self-pay | Admitting: Obstetrics and Gynecology

## 2020-03-09 ENCOUNTER — Other Ambulatory Visit: Payer: Self-pay

## 2020-03-09 ENCOUNTER — Encounter (INDEPENDENT_AMBULATORY_CARE_PROVIDER_SITE_OTHER): Payer: Medicare Other

## 2020-03-09 ENCOUNTER — Ambulatory Visit (INDEPENDENT_AMBULATORY_CARE_PROVIDER_SITE_OTHER): Payer: Medicare Other | Admitting: Gynecology

## 2020-03-09 DIAGNOSIS — M810 Age-related osteoporosis without current pathological fracture: Secondary | ICD-10-CM

## 2020-03-09 DIAGNOSIS — Z780 Asymptomatic menopausal state: Secondary | ICD-10-CM

## 2020-03-09 MED ORDER — DENOSUMAB 60 MG/ML ~~LOC~~ SOSY
60.0000 mg | PREFILLED_SYRINGE | Freq: Once | SUBCUTANEOUS | Status: AC
  Administered 2020-03-09: 60 mg via SUBCUTANEOUS

## 2020-03-22 ENCOUNTER — Encounter: Payer: Self-pay | Admitting: Obstetrics and Gynecology

## 2020-03-22 NOTE — Progress Notes (Signed)
Copy mailed.

## 2020-03-27 ENCOUNTER — Other Ambulatory Visit: Payer: Self-pay | Admitting: Interventional Cardiology

## 2020-03-31 ENCOUNTER — Ambulatory Visit: Payer: Medicare Other

## 2020-04-13 ENCOUNTER — Other Ambulatory Visit: Payer: Self-pay

## 2020-04-13 DIAGNOSIS — M79606 Pain in leg, unspecified: Secondary | ICD-10-CM

## 2020-04-14 ENCOUNTER — Ambulatory Visit (INDEPENDENT_AMBULATORY_CARE_PROVIDER_SITE_OTHER): Payer: Medicare Other | Admitting: Ophthalmology

## 2020-04-14 ENCOUNTER — Other Ambulatory Visit: Payer: Self-pay

## 2020-04-14 ENCOUNTER — Encounter (INDEPENDENT_AMBULATORY_CARE_PROVIDER_SITE_OTHER): Payer: Self-pay | Admitting: Ophthalmology

## 2020-04-14 DIAGNOSIS — H43821 Vitreomacular adhesion, right eye: Secondary | ICD-10-CM

## 2020-04-14 DIAGNOSIS — H33101 Unspecified retinoschisis, right eye: Secondary | ICD-10-CM | POA: Diagnosis not present

## 2020-04-14 HISTORY — DX: Vitreomacular adhesion, right eye: H43.821

## 2020-04-14 NOTE — Assessment & Plan Note (Signed)
Apparently some impact currently on visual acuity particularly in the right eye, I have instructed the patient to use monocular visual acuity testing particularly with reading glasses and reading vision to demonstrate to herself the impact of this anatomic condition on her reading vision.  Surgical intervention could be undertaken but only on a nonemergent basis once the patient can nearly see the difference in acuity particularly with near vision.

## 2020-04-14 NOTE — Progress Notes (Signed)
04/14/2020     CHIEF COMPLAINT Patient presents for Retina Evaluation   HISTORY OF PRESENT ILLNESS: Lynn Brown is a 86 y.o. female who presents to the clinic today for:   HPI    Retina Evaluation    In both eyes.  This started 3 years ago.  Duration of 3 years.  Associated Symptoms: dryness.  Treatments tried include eye drops.  Response to treatment was no improvement.          Comments    NP VMT OU, AMD - Ref'd by Dr. Juliann Pulse  Pt c/o dryness worsening over the past few years OU. Pt denies noticeable changes to [place] OU. No distortion OU. No flashes or floaters reported OU.       Last edited by Ileana Roup, COA on 04/14/2020 10:48 AM. (History)      Referring physician: Laurann Montana, MD [phone] [place],  [place] [address]  HISTORICAL INFORMATION:   Selected notes from the MEDICAL RECORD NUMBER       CURRENT MEDICATIONS: No current outpatient medications on file. (Ophthalmic Drugs)   No current facility-administered medications for this visit. (Ophthalmic Drugs)   Current Outpatient Medications (Other)  Medication Sig  . amLODipine (NORVASC) 2.5 MG tablet TAKE 1 TABLET BY MOUTH  DAILY  . aspirin EC 81 MG tablet Take 1 tablet (81 mg total) by mouth daily. Swallow whole.  . calcium citrate-vitamin D (CITRACAL+D) 315-200 MG-UNIT per tablet Take 1 tablet by mouth daily.   . cetirizine (ZYRTEC) 10 MG tablet Take 10 mg by mouth daily.  . Cholecalciferol (VITAMIN D) 2000 UNITS tablet Take 2,000 Units by mouth daily.    . clorazepate (TRANXENE) 7.5 MG tablet Take 7.5 mg by mouth as needed for anxiety.  . Coenzyme Q10 (CO Q 10 PO) Take 1 tablet by mouth daily.  Marland Kitchen levothyroxine (SYNTHROID) 112 MCG tablet Take 112 mcg by mouth every morning.  . magnesium oxide (MAG-OX) 400 MG tablet Take 400 mg by mouth daily.  . metoprolol tartrate (LOPRESSOR) 25 MG tablet Take 0.5 tablets (12.5 mg total) by mouth 2 (two) times daily.  . nitroGLYCERIN  (NITROSTAT) 0.4 MG SL tablet Place 1 tablet (0.4 mg total) under the tongue every 5 (five) minutes as needed for chest pain.  . Omega-3 Fatty Acids (FISH OIL PO) Take 1 tablet by mouth daily.  . rosuvastatin (CRESTOR) 20 MG tablet Take 20 mg by mouth daily.  Marland Kitchen spironolactone (ALDACTONE) 25 MG tablet TAKE 1 TABLET BY MOUTH  DAILY   No current facility-administered medications for this visit. (Other)      REVIEW OF SYSTEMS:    ALLERGIES No Known Allergies  PAST MEDICAL HISTORY Past Medical History:  Diagnosis Date  . Allergic rhinitis   . Anxiety   . Bunion    PES PLANUS  . Cervical disc disease   . Hearing loss    HEARING AIDS, BUT DOES NOT WEAR THEM CONSISTENTLY.   Marland Kitchen Hypercholesteremia   . Hypertension   . Hypothyroid   . Leaky heart valve    DR. HARWANI  . Mild obesity   . Osteoarthritis of knees, bilateral   . Osteoporosis 12/2017   T score -3.3  . Venous insufficiency    Past Surgical History:  Procedure Laterality Date  . CATARACT EXTRACTION, BILATERAL  2013   DR. SHAPIRO   . HYSTEROSCOPY     D & C  . LEFT HEART CATH AND CORONARY ANGIOGRAPHY N/A 03/10/2018  Procedure: LEFT HEART CATH AND CORONARY ANGIOGRAPHY;  Surgeon: Lyn RecordsSmith, Henry W, MD;  [facility];  Service: Cardiovascular;  Laterality: N/A;  . NEPHRECTOMY LIVING DONOR  1978    FAMILY HISTORY Family History  Problem Relation Age of Onset  . Hypertension Mother   . CVA Mother   . Diabetes Sister   . Hypertension Sister   . Heart disease Sister   . Multiple sclerosis Sister   . Hypertension Father   . CVA Father   . Cancer Brother        prostrate  . Hypertension Brother   . CVA Brother   . Seizures Brother   . Pancreatic cancer Sister   . Kidney disease Sister   . Kidney failure Sister   . Heart attack Sister   . Other Brother        PNEUMONIA AS A BABY  . Hypertension Brother   . Breast cancer Neg Hx     SOCIAL HISTORY Social History   Tobacco Use  . Smoking  status: Never Smoker  . Smokeless tobacco: Never Used  Vaping Use  . Vaping Use: Never used  Substance Use Topics  . Alcohol use: No    Alcohol/week: 0.0 standard drinks  . Drug use: No         OPHTHALMIC EXAM:  Base Eye Exam    Visual Acuity (ETDRS)      Right Left   Dist cc 20/60 20/25 +1   Dist ph cc 20/50 +2    Correction: Glasses       Tonometry (Tonopen, 10:49 AM)      Right Left   Pressure 10 13       Pupils      Pupils Dark Light Shape React APD   Right PERRL 5 4 Round Brisk None   Left PERRL 5 4 Round Brisk None       Visual Fields (Counting fingers)      Left Right    Full Full       Extraocular Movement      Right Left    Full Full       Neuro/Psych    Oriented x3: Yes   Mood/Affect: Normal       Dilation    Both eyes: 1.0% Mydriacyl, 2.5% Phenylephrine @ 10:54 AM        Slit Lamp and Fundus Exam    External Exam      Right Left   External Normal Normal       Slit Lamp Exam      Right Left   Lids/Lashes Normal Normal   Conjunctiva/Sclera White and quiet White and quiet   Cornea Clear Clear   Anterior Chamber Deep and quiet Deep and quiet   Iris Round and reactive Round and reactive   Lens Posterior chamber intraocular lens Posterior chamber intraocular lens   Anterior Vitreous Normal Normal       Fundus Exam      Right Left   Posterior Vitreous Normal Normal   Disc Normal Normal   C/D Ratio 0.45 0.5   Macula Pseudocystoid CME Normal   Vessels Normal Normal   Periphery Normal Normal          IMAGING AND PROCEDURES  Imaging and Procedures for 04/14/20  OCT, Retina - OU - Both Eyes       Right Eye Quality was good. Scan locations included subfoveal. Central Foveal Thickness: 293. Progression has no prior data. Findings  include vitreous traction, vitreomacular adhesion .   Left Eye Quality was good. Scan locations included subfoveal. Central Foveal Thickness: 274. Progression has no prior data. Findings include  vitreous traction, vitreomacular adhesion .   Notes Small inner retinal foveal macular schisis secondary to vitreal macular traction OD, no outer retinal disturbance right eye.  Left eye  With minor atrial macular traction with no foveal distortion                ASSESSMENT/PLAN:  Vitreomacular traction, right Vitreomacular traction may cause vision loss from anatomic distortion to the center of the vision, the macula.  If visual function is symptomatic or threatened, therapy may be needed.  Surgical intervention offers the highest chance of visual stability and improvement.  Distortion of the macula anatomy may cause splitting of the retinal layers, termed foveomacular retinoschisis, which can cause more permanent vision loss.  Epiretinal membranes may also be associated.  Macular hole may also develop if vitreomacular traction progresses. The minor form of this condition is Vitreomacular adhesion, which is a natural change in the aging process of the eye, which requires observation only.  Macular retinoschisis, right Apparently some impact currently on visual acuity particularly in the right eye, I have instructed the patient to use monocular visual acuity testing particularly with reading glasses and reading vision to demonstrate to herself the impact of this anatomic condition on her reading vision.  Surgical intervention could be undertaken but only on a nonemergent basis once the patient can nearly see the difference in acuity particularly with near vision.      ICD-10-CM   1. Vitreomacular traction, right  H43.821 OCT, Retina - OU - Both Eyes  2. Macular retinoschisis, right  H33.101 OCT, Retina - OU - Both Eyes    1.  We will asked patient to follow-up in 3 to 4 months simply to monitor the anatomy of the right eye and its impact on acuity.  2.  This should allow the patient to have demonstrable time to compare the visual acuity in the right eye versus left eye particular  with reading  3.  Ophthalmic Meds Ordered this visit:  No orders of the defined types were placed in this encounter.      Return in about 3 months (around 07/15/2020) for DILATE OU, OCT.  There are no Patient Instructions on file for this visit.   Explained the diagnoses, plan, and follow up with the patient and they expressed understanding.  Patient expressed understanding of the importance of proper follow up care.   Alford Highland Trayquan Kolakowski M.D. Diseases & Surgery of the Retina and Vitreous [facility] 04/14/20     Abbreviations: M myopia (nearsighted); A astigmatism; H hyperopia (farsighted); P presbyopia; Mrx spectacle prescription;  CTL contact lenses; OD right eye; OS left eye; OU both eyes  XT exotropia; ET esotropia; PEK punctate epithelial keratitis; PEE punctate epithelial erosions; DES dry eye syndrome; MGD meibomian gland dysfunction; ATs artificial tears; PFAT's preservative free artificial tears; NSC nuclear sclerotic cataract; PSC posterior subcapsular cataract; ERM epi-retinal membrane; PVD posterior vitreous detachment; RD retinal detachment; DM diabetes mellitus; DR diabetic retinopathy; NPDR non-proliferative diabetic retinopathy; PDR proliferative diabetic retinopathy; CSME clinically significant macular edema; DME diabetic macular edema; dbh dot blot hemorrhages; CWS cotton wool spot; POAG primary open angle glaucoma; C/D cup-to-disc ratio; HVF humphrey visual field; GVF goldmann visual field; OCT optical coherence tomography; IOP intraocular pressure; BRVO Branch retinal vein occlusion; CRVO central retinal vein occlusion; CRAO central  retinal artery occlusion; BRAO branch retinal artery occlusion; RT retinal tear; SB scleral buckle; PPV pars plana vitrectomy; VH Vitreous hemorrhage; PRP panretinal laser photocoagulation; IVK intravitreal kenalog; VMT vitreomacular traction; MH Macular hole;  NVD neovascularization of the disc; NVE neovascularization elsewhere;  AREDS age related eye disease study; ARMD age related macular degeneration; POAG primary open angle glaucoma; EBMD epithelial/anterior basement membrane dystrophy; ACIOL anterior chamber intraocular lens; IOL intraocular lens; PCIOL posterior chamber intraocular lens; Phaco/IOL phacoemulsification with intraocular lens placement; PRK photorefractive keratectomy; LASIK laser assisted in situ keratomileusis; HTN hypertension; DM diabetes mellitus; COPD chronic obstructive pulmonary disease

## 2020-04-14 NOTE — Assessment & Plan Note (Signed)
Vitreomacular traction may cause vision loss from anatomic distortion to the center of the vision, the macula.  If visual function is symptomatic or threatened, therapy may be needed.  Surgical intervention offers the highest chance of visual stability and improvement.  Distortion of the macula anatomy may cause splitting of the retinal layers, termed foveomacular retinoschisis, which can cause more permanent vision loss.  Epiretinal membranes may also be associated.  Macular hole may also develop if vitreomacular traction progresses. The minor form of this condition is Vitreomacular adhesion, which is a natural change in the aging process of the eye, which requires observation only. 

## 2020-04-15 ENCOUNTER — Encounter (HOSPITAL_COMMUNITY): Payer: Medicare Other

## 2020-04-15 ENCOUNTER — Other Ambulatory Visit: Payer: Self-pay

## 2020-04-15 DIAGNOSIS — M79606 Pain in leg, unspecified: Secondary | ICD-10-CM

## 2020-04-18 ENCOUNTER — Ambulatory Visit (HOSPITAL_COMMUNITY)
Admission: RE | Admit: 2020-04-18 | Discharge: 2020-04-18 | Disposition: A | Discharge status: Home / Self Care | Payer: Medicare Other | Source: Ambulatory Visit | Attending: Vascular Surgery | Admitting: Vascular Surgery

## 2020-04-18 ENCOUNTER — Other Ambulatory Visit: Payer: Self-pay

## 2020-04-18 DIAGNOSIS — M79606 Pain in leg, unspecified: Secondary | ICD-10-CM

## 2020-04-19 ENCOUNTER — Ambulatory Visit (INDEPENDENT_AMBULATORY_CARE_PROVIDER_SITE_OTHER): Payer: Medicare Other | Admitting: Vascular Surgery

## 2020-04-19 ENCOUNTER — Encounter: Payer: Self-pay | Admitting: Vascular Surgery

## 2020-04-19 VITALS — BP 125/79 | HR 67 | Temp 97.5°F | Resp 20 | Ht 60.0 in | Wt 189.0 lb

## 2020-04-19 DIAGNOSIS — I872 Venous insufficiency (chronic) (peripheral): Secondary | ICD-10-CM | POA: Diagnosis not present

## 2020-04-19 DIAGNOSIS — I70222 Atherosclerosis of native arteries of extremities with rest pain, left leg: Secondary | ICD-10-CM | POA: Diagnosis not present

## 2020-04-19 NOTE — Progress Notes (Signed)
  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 She has impending venous ulceration about the medial ankle on bilateral lower extremities. She has severe LLE PAD +/- ischemic rest pain. She needs aggressive compression therapy with Unna boots, but her PAD needs to be optimized prior to this.   Recommend:  Complete cessation from all tobacco products. Blood glucose control with goal A1c < 7%. Blood pressure control with goal blood pressure < 140/90 mmHg. Lipid reduction therapy with goal LDL-C <100 mg/dL (<70 if symptomatic from PAD).  Aspirin 81mg PO QD.  Atorvastatin 40-80mg PO QD (or other "high intensity" statin therapy).  Plan bilateral lower extremity angiography with possible LLE intervention via [facility] access 05/04/20.  CHIEF COMPLAINT:   Leg pain  HISTORY:  HISTORY OF PRESENT ILLNESS: Lynn Brown is a 86 y.o. female who presents to clinic today for evaluation of bilateral lower extremity pain.  She reports 2 areas of discoloration over her medial ankles bilaterally which are most uncomfortable.  She reports these have been present for many months.  She is ambulatory, has to move quite slowly.  I do not think she walks far enough to claudicate.  She ports pain that is worse at night in the left heel, but does not give a history of classic ischemic rest pain in either lower extremity.  She has no ulcerations on her feet.  Reports no history of DVT.  He does report bothersome swelling in the calves and ankles bilaterally.  Past Medical History:  Diagnosis Date  . Allergic rhinitis   . Anxiety   . Bunion    PES PLANUS  . Cervical disc disease   . Hearing loss    HEARING AIDS, BUT DOES NOT WEAR THEM CONSISTENTLY.   . Hypercholesteremia   . Hypertension   . Hypothyroid   . Leaky heart valve    DR. HARWANI  . Mild obesity   . Osteoarthritis of knees, bilateral   . Osteoporosis 12/2017   T score -3.3  . Venous insufficiency     Past Surgical  History:  Procedure Laterality Date  . CATARACT EXTRACTION, BILATERAL  2013   DR. SHAPIRO   . HYSTEROSCOPY     D & C  . LEFT HEART CATH AND CORONARY ANGIOGRAPHY N/A 03/10/2018   Procedure: LEFT HEART CATH AND CORONARY ANGIOGRAPHY;  Surgeon: Smith, Henry W, MD;  Location: [facility];  Service: Cardiovascular;  Laterality: N/A;  . NEPHRECTOMY LIVING DONOR  1978    Family History  Problem Relation Age of Onset  . Hypertension Mother   . CVA Mother   . Diabetes Sister   . Hypertension Sister   . Heart disease Sister   . Multiple sclerosis Sister   . Hypertension Father   . CVA Father   . Cancer Brother        prostrate  . Hypertension Brother   . CVA Brother   . Seizures Brother   . Pancreatic cancer Sister   . Kidney disease Sister   . Kidney failure Sister   . Heart attack Sister   . Other Brother        PNEUMONIA AS A BABY  . Hypertension Brother   . Breast cancer Neg Hx     Social History   Socioeconomic History  . Marital status: Widowed    Spouse name: Not on file  . Number of children: 4  . Years of education: Not on file  .   Highest education level: Not on file  Occupational History  . Occupation: RETIRED  Tobacco Use  . Smoking status: Never Smoker  . Smokeless tobacco: Never Used  Vaping Use  . Vaping Use: Never used  Substance and Sexual Activity  . Alcohol use: No    Alcohol/week: 0.0 standard drinks  . Drug use: No  . Sexual activity: Never    Comment: 1st intercourse 86 yo-Fewer than 5 partners  Other Topics Concern  . Not on file  Social History Narrative  . Not on file   Social Determinants of Health   Financial Resource Strain:   . Difficulty of Paying Living Expenses: Not on file  Food Insecurity:   . Worried About Programme researcher, broadcasting/film/video in the Last Year: Not on file  . Ran Out of Food in the Last Year: Not on file  Transportation Needs:   . Lack of Transportation (Medical): Not on file  . Lack of Transportation (Non-Medical):  Not on file  Physical Activity:   . Days of Exercise per Week: Not on file  . Minutes of Exercise per Session: Not on file  Stress:   . Feeling of Stress : Not on file  Social Connections:   . Frequency of Communication with Friends and Family: Not on file  . Frequency of Social Gatherings with Friends and Family: Not on file  . Attends Religious Services: Not on file  . Active Member of Clubs or Organizations: Not on file  . Attends Banker Meetings: Not on file  . Marital Status: Not on file  Intimate Partner Violence:   . Fear of Current or Ex-Partner: Not on file  . Emotionally Abused: Not on file  . Physically Abused: Not on file  . Sexually Abused: Not on file    No Known Allergies  Current Outpatient Medications  Medication Sig Dispense Refill  . amLODipine (NORVASC) 2.5 MG tablet TAKE 1 TABLET BY MOUTH  DAILY 90 tablet 3  . aspirin EC 81 MG tablet Take 1 tablet (81 mg total) by mouth daily. Swallow whole. 90 tablet 3  . calcium citrate-vitamin D (CITRACAL+D) 315-200 MG-UNIT per tablet Take 1 tablet by mouth daily.     . cetirizine (ZYRTEC) 10 MG tablet Take 10 mg by mouth daily.    . Cholecalciferol (VITAMIN D) 2000 UNITS tablet Take 2,000 Units by mouth daily.      . clorazepate (TRANXENE) 7.5 MG tablet Take 7.5 mg by mouth as needed for anxiety.    . Coenzyme Q10 (CO Q 10 PO) Take 1 tablet by mouth daily.    Marland Kitchen levothyroxine (SYNTHROID) 112 MCG tablet Take 112 mcg by mouth every morning.    . magnesium oxide (MAG-OX) 400 MG tablet Take 400 mg by mouth daily.    . metoprolol tartrate (LOPRESSOR) 25 MG tablet Take 0.5 tablets (12.5 mg total) by mouth 2 (two) times daily. 60 tablet 0  . nitroGLYCERIN (NITROSTAT) 0.4 MG SL tablet Place 1 tablet (0.4 mg total) under the tongue every 5 (five) minutes as needed for chest pain. 30 tablet 0  . Omega-3 Fatty Acids (FISH OIL PO) Take 1 tablet by mouth daily.    . rosuvastatin (CRESTOR) 20 MG tablet Take 20 mg by mouth  daily.    Marland Kitchen spironolactone (ALDACTONE) 25 MG tablet TAKE 1 TABLET BY MOUTH  DAILY 90 tablet 3   No current facility-administered medications for this visit.    REVIEW OF SYSTEMS:  [X]  denotes positive finding, [ ]   denotes negative finding Cardiac  Comments:  Chest pain or chest pressure:    Shortness of breath upon exertion:    Short of breath when lying flat:    Irregular heart rhythm:        Vascular    Pain in calf, thigh, or hip brought on by ambulation:    Pain in feet at night that wakes you up from your sleep:  x   Blood clot in your veins:    Leg swelling:         Pulmonary    Oxygen at home:    Productive cough:     Wheezing:         Neurologic    Sudden weakness in arms or legs:     Sudden numbness in arms or legs:     Sudden onset of difficulty speaking or slurred speech:    Temporary loss of vision in one eye:     Problems with dizziness:         Gastrointestinal    Blood in stool:     Vomited blood:         Genitourinary    Burning when urinating:     Blood in urine:        Psychiatric    Major depression:         Hematologic    Bleeding problems:    Problems with blood clotting too easily:        Skin    Rashes or ulcers:        Constitutional    Fever or chills:     PHYSICAL EXAM:   Vitals:   04/19/20 1535  BP: 125/79  Pulse: 67  Resp: 20  Temp: (!) 97.5 F (36.4 C)  SpO2: 100%  Weight: 189 lb (85.7 kg)  Height: 5' (1.524 m)   Constitutional: Well appearing in no distress. Appears well nourished.  Neurologic: Normal gait and station. CN intact. No weakness. No sensory loss. Psychiatric: Mood and affect symmetric and appropriate. Eyes: No icterus. No conjunctival pallor. Ears, nose, throat: mucous membranes moist. Midline trachea. No carotid bruit. Cardiac: regular rate and rhythm.  Respiratory: unlabored. Abdominal: soft, non-tender, non-distended. No palpable pulsatile abdominal mass. Peripheral vascular:  Femoral pulse: L 2+  / R 2+  Dorsalis pedis pulse: L absent / R absent  Posterior tibial pulse: L absent / R absent  Medial venous stasis dermatitis bilaterally Extremity: No edema. No cyanosis. No pallor.  Skin: No gangrene. No ulceration.  Lymphatic: No Stemmer's sign. No palpable lymphadenopathy.   DATA REVIEW:    Most recent CBC CBC Latest Ref Rng & Units 03/12/2018 03/11/2018 03/10/2018  WBC 4.0 - 10.5 K/uL 5.4 4.9 5.0  Hemoglobin 12.0 - 15.0 g/dL 82.9 56.2 13.0  Hematocrit 36 - 46 % 40.4 39.5 41.3  Platelets 150 - 400 K/uL 144(L) 141(L) 145(L)     Most recent CMP CMP Latest Ref Rng & Units 02/18/2020 07/04/2018 03/12/2018  Glucose 65 - 99 mg/dL - 87 865(H)  BUN 8 - 27 mg/dL - 19 84(O)  Creatinine 0.57 - 1.00 mg/dL - 9.62(X) 5.28(U)  Sodium 134 - 144 mmol/L - 142 140  Potassium 3.5 - 5.2 mmol/L - 4.1 4.7  Chloride 96 - 106 mmol/L - 103 106  CO2 20 - 29 mmol/L - 25 28  Calcium 8.6 - 10.4 mg/dL 9.9 9.4 9.3  Total Protein 6.5 - 8.1 g/dL - - -  Total Bilirubin 0.3 - 1.2 mg/dL - - -  Alkaline Phos 38 - 126 U/L - - -  AST 15 - 41 U/L - - -  ALT 0 - 44 U/L - - -    Renal function CrCl cannot be calculated (Patient's most recent lab result is older than the maximum 21 days allowed.).  No results found for: HGBA1C  No results found for: LDLCALC, LDLC, HIRISKLDL, POCLDL, LDLDIRECT, REALLDLC, TOTLDLC   Vascular Imaging:   Rande Brunt. Lenell Antu, MD [facility] Phone Number: [phone] 04/19/2020 3:43 PM

## 2020-04-19 NOTE — H&P (View-Only) (Signed)
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 She has impending venous ulceration about the medial ankle on bilateral lower extremities. She has severe LLE PAD +/- ischemic rest pain. She needs aggressive compression therapy with Unna boots, but her PAD needs to be optimized prior to this.   Recommend:  Complete cessation from all tobacco products. Blood glucose control with goal A1c < 7%. Blood pressure control with goal blood pressure < 140/90 mmHg. Lipid reduction therapy with goal LDL-C <100 mg/dL (<30<70 if symptomatic from PAD).  Aspirin 81mg  PO QD.  Atorvastatin 40-80mg  PO QD (or other "high intensity" statin therapy).  Plan bilateral lower extremity angiography with possible LLE intervention via RCFA access 05/04/20.  CHIEF COMPLAINT:   Leg pain  HISTORY:  HISTORY OF PRESENT ILLNESS: Lynn Brown is a 86 y.o. female who presents to clinic today for evaluation of bilateral lower extremity pain.  She reports 2 areas of discoloration over her medial ankles bilaterally which are most uncomfortable.  She reports these have been present for many months.  She is ambulatory, has to move quite slowly.  I do not think she walks far enough to claudicate.  She ports pain that is worse at night in the left heel, but does not give a history of classic ischemic rest pain in either lower extremity.  She has no ulcerations on her feet.  Reports no history of DVT.  He does report bothersome swelling in the calves and ankles bilaterally.  Past Medical History:  Diagnosis Date  . Allergic rhinitis   . Anxiety   . Bunion    PES PLANUS  . Cervical disc disease   . Hearing loss    HEARING AIDS, BUT DOES NOT WEAR THEM CONSISTENTLY.   Marland Kitchen. Hypercholesteremia   . Hypertension   . Hypothyroid   . Leaky heart valve    DR. HARWANI  . Mild obesity   . Osteoarthritis of knees, bilateral   . Osteoporosis 12/2017   T score -3.3  . Venous insufficiency     Past Surgical  History:  Procedure Laterality Date  . CATARACT EXTRACTION, BILATERAL  2013   DR. SHAPIRO   . HYSTEROSCOPY     D & C  . LEFT HEART CATH AND CORONARY ANGIOGRAPHY N/A 03/10/2018   Procedure: LEFT HEART CATH AND CORONARY ANGIOGRAPHY;  Surgeon: Lyn RecordsSmith, Henry W, MD;  Location: [facility];  Service: Cardiovascular;  Laterality: N/A;  . NEPHRECTOMY LIVING DONOR  1978    Family History  Problem Relation Age of Onset  . Hypertension Mother   . CVA Mother   . Diabetes Sister   . Hypertension Sister   . Heart disease Sister   . Multiple sclerosis Sister   . Hypertension Father   . CVA Father   . Cancer Brother        prostrate  . Hypertension Brother   . CVA Brother   . Seizures Brother   . Pancreatic cancer Sister   . Kidney disease Sister   . Kidney failure Sister   . Heart attack Sister   . Other Brother        PNEUMONIA AS A BABY  . Hypertension Brother   . Breast cancer Neg Hx     Social History   Socioeconomic History  . Marital status: Widowed    Spouse name: Not on file  . Number of children: 4  . Years of education: Not on file  .  Highest education level: Not on file  Occupational History  . Occupation: RETIRED  Tobacco Use  . Smoking status: Never Smoker  . Smokeless tobacco: Never Used  Vaping Use  . Vaping Use: Never used  Substance and Sexual Activity  . Alcohol use: No    Alcohol/week: 0.0 standard drinks  . Drug use: No  . Sexual activity: Never    Comment: 1st intercourse 86 yo-Fewer than 5 partners  Other Topics Concern  . Not on file  Social History Narrative  . Not on file   Social Determinants of Health   Financial Resource Strain:   . Difficulty of Paying Living Expenses: Not on file  Food Insecurity:   . Worried About Programme researcher, broadcasting/film/video in the Last Year: Not on file  . Ran Out of Food in the Last Year: Not on file  Transportation Needs:   . Lack of Transportation (Medical): Not on file  . Lack of Transportation (Non-Medical):  Not on file  Physical Activity:   . Days of Exercise per Week: Not on file  . Minutes of Exercise per Session: Not on file  Stress:   . Feeling of Stress : Not on file  Social Connections:   . Frequency of Communication with Friends and Family: Not on file  . Frequency of Social Gatherings with Friends and Family: Not on file  . Attends Religious Services: Not on file  . Active Member of Clubs or Organizations: Not on file  . Attends Banker Meetings: Not on file  . Marital Status: Not on file  Intimate Partner Violence:   . Fear of Current or Ex-Partner: Not on file  . Emotionally Abused: Not on file  . Physically Abused: Not on file  . Sexually Abused: Not on file    No Known Allergies  Current Outpatient Medications  Medication Sig Dispense Refill  . amLODipine (NORVASC) 2.5 MG tablet TAKE 1 TABLET BY MOUTH  DAILY 90 tablet 3  . aspirin EC 81 MG tablet Take 1 tablet (81 mg total) by mouth daily. Swallow whole. 90 tablet 3  . calcium citrate-vitamin D (CITRACAL+D) 315-200 MG-UNIT per tablet Take 1 tablet by mouth daily.     . cetirizine (ZYRTEC) 10 MG tablet Take 10 mg by mouth daily.    . Cholecalciferol (VITAMIN D) 2000 UNITS tablet Take 2,000 Units by mouth daily.      . clorazepate (TRANXENE) 7.5 MG tablet Take 7.5 mg by mouth as needed for anxiety.    . Coenzyme Q10 (CO Q 10 PO) Take 1 tablet by mouth daily.    Marland Kitchen levothyroxine (SYNTHROID) 112 MCG tablet Take 112 mcg by mouth every morning.    . magnesium oxide (MAG-OX) 400 MG tablet Take 400 mg by mouth daily.    . metoprolol tartrate (LOPRESSOR) 25 MG tablet Take 0.5 tablets (12.5 mg total) by mouth 2 (two) times daily. 60 tablet 0  . nitroGLYCERIN (NITROSTAT) 0.4 MG SL tablet Place 1 tablet (0.4 mg total) under the tongue every 5 (five) minutes as needed for chest pain. 30 tablet 0  . Omega-3 Fatty Acids (FISH OIL PO) Take 1 tablet by mouth daily.    . rosuvastatin (CRESTOR) 20 MG tablet Take 20 mg by mouth  daily.    Marland Kitchen spironolactone (ALDACTONE) 25 MG tablet TAKE 1 TABLET BY MOUTH  DAILY 90 tablet 3   No current facility-administered medications for this visit.    REVIEW OF SYSTEMS:  [X]  denotes positive finding, [ ]   denotes negative finding Cardiac  Comments:  Chest pain or chest pressure:    Shortness of breath upon exertion:    Short of breath when lying flat:    Irregular heart rhythm:        Vascular    Pain in calf, thigh, or hip brought on by ambulation:    Pain in feet at night that wakes you up from your sleep:  x   Blood clot in your veins:    Leg swelling:         Pulmonary    Oxygen at home:    Productive cough:     Wheezing:         Neurologic    Sudden weakness in arms or legs:     Sudden numbness in arms or legs:     Sudden onset of difficulty speaking or slurred speech:    Temporary loss of vision in one eye:     Problems with dizziness:         Gastrointestinal    Blood in stool:     Vomited blood:         Genitourinary    Burning when urinating:     Blood in urine:        Psychiatric    Major depression:         Hematologic    Bleeding problems:    Problems with blood clotting too easily:        Skin    Rashes or ulcers:        Constitutional    Fever or chills:     PHYSICAL EXAM:   Vitals:   04/19/20 1535  BP: 125/79  Pulse: 67  Resp: 20  Temp: (!) 97.5 F (36.4 C)  SpO2: 100%  Weight: 189 lb (85.7 kg)  Height: 5' (1.524 m)   Constitutional: Well appearing in no distress. Appears well nourished.  Neurologic: Normal gait and station. CN intact. No weakness. No sensory loss. Psychiatric: Mood and affect symmetric and appropriate. Eyes: No icterus. No conjunctival pallor. Ears, nose, throat: mucous membranes moist. Midline trachea. No carotid bruit. Cardiac: regular rate and rhythm.  Respiratory: unlabored. Abdominal: soft, non-tender, non-distended. No palpable pulsatile abdominal mass. Peripheral vascular:  Femoral pulse: L 2+  / R 2+  Dorsalis pedis pulse: L absent / R absent  Posterior tibial pulse: L absent / R absent  Medial venous stasis dermatitis bilaterally Extremity: No edema. No cyanosis. No pallor.  Skin: No gangrene. No ulceration.  Lymphatic: No Stemmer's sign. No palpable lymphadenopathy.   DATA REVIEW:    Most recent CBC CBC Latest Ref Rng & Units 03/12/2018 03/11/2018 03/10/2018  WBC 4.0 - 10.5 K/uL 5.4 4.9 5.0  Hemoglobin 12.0 - 15.0 g/dL 82.9 56.2 13.0  Hematocrit 36 - 46 % 40.4 39.5 41.3  Platelets 150 - 400 K/uL 144(L) 141(L) 145(L)     Most recent CMP CMP Latest Ref Rng & Units 02/18/2020 07/04/2018 03/12/2018  Glucose 65 - 99 mg/dL - 87 865(H)  BUN 8 - 27 mg/dL - 19 84(O)  Creatinine 0.57 - 1.00 mg/dL - 9.62(X) 5.28(U)  Sodium 134 - 144 mmol/L - 142 140  Potassium 3.5 - 5.2 mmol/L - 4.1 4.7  Chloride 96 - 106 mmol/L - 103 106  CO2 20 - 29 mmol/L - 25 28  Calcium 8.6 - 10.4 mg/dL 9.9 9.4 9.3  Total Protein 6.5 - 8.1 g/dL - - -  Total Bilirubin 0.3 - 1.2 mg/dL - - -  Alkaline Phos 38 - 126 U/L - - -  AST 15 - 41 U/L - - -  ALT 0 - 44 U/L - - -    Renal function CrCl cannot be calculated (Patient's most recent lab result is older than the maximum 21 days allowed.).  No results found for: HGBA1C  No results found for: LDLCALC, LDLC, HIRISKLDL, POCLDL, LDLDIRECT, REALLDLC, TOTLDLC   Vascular Imaging:   Rande Brunt. Lenell Antu, MD [facility] Phone Number: [phone] 04/19/2020 3:43 PM

## 2020-04-20 ENCOUNTER — Other Ambulatory Visit: Payer: Self-pay

## 2020-05-03 ENCOUNTER — Other Ambulatory Visit (HOSPITAL_COMMUNITY)
Admission: RE | Admit: 2020-05-03 | Discharge: 2020-05-03 | Disposition: A | Discharge status: Home / Self Care | Payer: Medicare Other | Source: Ambulatory Visit | Attending: Vascular Surgery | Admitting: Vascular Surgery

## 2020-05-03 DIAGNOSIS — Z01812 Encounter for preprocedural laboratory examination: Secondary | ICD-10-CM | POA: Insufficient documentation

## 2020-05-03 DIAGNOSIS — Z20822 Contact with and (suspected) exposure to covid-19: Secondary | ICD-10-CM | POA: Diagnosis not present

## 2020-05-03 LAB — SARS CORONAVIRUS 2 (TAT 6-24 HRS): SARS Coronavirus 2: NEGATIVE

## 2020-05-04 ENCOUNTER — Ambulatory Visit (HOSPITAL_COMMUNITY)
Admission: RE | Admit: 2020-05-04 | Discharge: 2020-05-04 | Disposition: A | Discharge status: Home / Self Care | Payer: Medicare Other | Attending: Vascular Surgery | Admitting: Vascular Surgery

## 2020-05-04 ENCOUNTER — Other Ambulatory Visit: Payer: Self-pay

## 2020-05-04 ENCOUNTER — Encounter (HOSPITAL_COMMUNITY)
Admission: RE | Disposition: A | Discharge status: Home / Self Care | Payer: Self-pay | Source: Home / Self Care | Attending: Vascular Surgery

## 2020-05-04 DIAGNOSIS — I70223 Atherosclerosis of native arteries of extremities with rest pain, bilateral legs: Secondary | ICD-10-CM | POA: Diagnosis present

## 2020-05-04 DIAGNOSIS — I70243 Atherosclerosis of native arteries of left leg with ulceration of ankle: Secondary | ICD-10-CM | POA: Diagnosis not present

## 2020-05-04 DIAGNOSIS — L97329 Non-pressure chronic ulcer of left ankle with unspecified severity: Secondary | ICD-10-CM | POA: Diagnosis not present

## 2020-05-04 DIAGNOSIS — Z7982 Long term (current) use of aspirin: Secondary | ICD-10-CM | POA: Diagnosis not present

## 2020-05-04 DIAGNOSIS — Z79890 Hormone replacement therapy (postmenopausal): Secondary | ICD-10-CM | POA: Insufficient documentation

## 2020-05-04 DIAGNOSIS — Z79899 Other long term (current) drug therapy: Secondary | ICD-10-CM | POA: Diagnosis not present

## 2020-05-04 HISTORY — PX: ABDOMINAL AORTOGRAM W/LOWER EXTREMITY: CATH118223

## 2020-05-04 HISTORY — PX: PERIPHERAL VASCULAR INTERVENTION: CATH118257

## 2020-05-04 LAB — POCT I-STAT, CHEM 8
BUN: 28 mg/dL — ABNORMAL HIGH (ref 8–23)
Calcium, Ion: 1.21 mmol/L (ref 1.15–1.40)
Chloride: 106 mmol/L (ref 98–111)
Creatinine, Ser: 1 mg/dL (ref 0.44–1.00)
Glucose, Bld: 103 mg/dL — ABNORMAL HIGH (ref 70–99)
HCT: 44 % (ref 36.0–46.0)
Hemoglobin: 15 g/dL (ref 12.0–15.0)
Potassium: 4.5 mmol/L (ref 3.5–5.1)
Sodium: 141 mmol/L (ref 135–145)
TCO2: 26 mmol/L (ref 22–32)

## 2020-05-04 LAB — POCT ACTIVATED CLOTTING TIME
Activated Clotting Time: 255 seconds
Activated Clotting Time: 267 seconds

## 2020-05-04 SURGERY — ABDOMINAL AORTOGRAM W/LOWER EXTREMITY
Anesthesia: LOCAL | Laterality: Left

## 2020-05-04 MED ORDER — LIDOCAINE HCL (PF) 1 % IJ SOLN
INTRAMUSCULAR | Status: AC
  Filled 2020-05-04: qty 30

## 2020-05-04 MED ORDER — SODIUM CHLORIDE 0.9 % IV SOLN: INTRAVENOUS | Status: DC

## 2020-05-04 MED ORDER — HEPARIN SODIUM (PORCINE) 1000 UNIT/ML IJ SOLN
INTRAMUSCULAR | Status: DC | PRN
  Administered 2020-05-04: 9000 [IU] via INTRAVENOUS
  Administered 2020-05-04: 2000 [IU] via INTRAVENOUS

## 2020-05-04 MED ORDER — MIDAZOLAM HCL 2 MG/2ML IJ SOLN
INTRAMUSCULAR | Status: DC | PRN
  Administered 2020-05-04 (×2): 1 mg via INTRAVENOUS

## 2020-05-04 MED ORDER — HEPARIN SODIUM (PORCINE) 1000 UNIT/ML IJ SOLN
INTRAMUSCULAR | Status: AC
  Filled 2020-05-04: qty 1

## 2020-05-04 MED ORDER — FENTANYL CITRATE (PF) 100 MCG/2ML IJ SOLN
INTRAMUSCULAR | Status: AC
  Filled 2020-05-04: qty 2

## 2020-05-04 MED ORDER — CLOPIDOGREL BISULFATE 300 MG PO TABS
ORAL_TABLET | ORAL | Status: DC | PRN
  Administered 2020-05-04: 300 mg via ORAL

## 2020-05-04 MED ORDER — SODIUM CHLORIDE 0.9% FLUSH: 3.0000 mL | INTRAVENOUS | Status: DC | PRN

## 2020-05-04 MED ORDER — ACETAMINOPHEN 325 MG PO TABS: 650.0000 mg | ORAL_TABLET | ORAL | Status: DC | PRN

## 2020-05-04 MED ORDER — CLOPIDOGREL BISULFATE 75 MG PO TABS: 75.0000 mg | ORAL_TABLET | Freq: Every day | ORAL | Status: DC

## 2020-05-04 MED ORDER — ONDANSETRON HCL 4 MG/2ML IJ SOLN: 4.0000 mg | Freq: Four times a day (QID) | INTRAMUSCULAR | Status: DC | PRN

## 2020-05-04 MED ORDER — SODIUM CHLORIDE 0.9 % IV SOLN: 250.0000 mL | INTRAVENOUS | Status: DC | PRN

## 2020-05-04 MED ORDER — FENTANYL CITRATE (PF) 100 MCG/2ML IJ SOLN
INTRAMUSCULAR | Status: DC | PRN
  Administered 2020-05-04 (×2): 25 ug via INTRAVENOUS

## 2020-05-04 MED ORDER — CLOPIDOGREL BISULFATE 300 MG PO TABS
ORAL_TABLET | ORAL | Status: AC
  Filled 2020-05-04: qty 1

## 2020-05-04 MED ORDER — HYDRALAZINE HCL 20 MG/ML IJ SOLN: 5.0000 mg | INTRAMUSCULAR | Status: DC | PRN

## 2020-05-04 MED ORDER — LIDOCAINE HCL (PF) 1 % IJ SOLN
INTRAMUSCULAR | Status: DC | PRN
  Administered 2020-05-04: 2 mL via INTRADERMAL
  Administered 2020-05-04: 15 mL via INTRADERMAL

## 2020-05-04 MED ORDER — MIDAZOLAM HCL 2 MG/2ML IJ SOLN
INTRAMUSCULAR | Status: AC
  Filled 2020-05-04: qty 2

## 2020-05-04 MED ORDER — HEPARIN (PORCINE) IN NACL 1000-0.9 UT/500ML-% IV SOLN
INTRAVENOUS | Status: AC
  Filled 2020-05-04: qty 500

## 2020-05-04 MED ORDER — SODIUM CHLORIDE 0.9% FLUSH: 3.0000 mL | Freq: Two times a day (BID) | INTRAVENOUS | Status: DC

## 2020-05-04 MED ORDER — IODIXANOL 320 MG/ML IV SOLN
INTRAVENOUS | Status: DC | PRN
  Administered 2020-05-04: 160 mL via INTRA_ARTERIAL

## 2020-05-04 MED ORDER — CLOPIDOGREL BISULFATE 75 MG PO TABS: 75.0000 mg | ORAL_TABLET | Freq: Every day | ORAL | 11 refills | Status: DC

## 2020-05-04 MED ORDER — LABETALOL HCL 5 MG/ML IV SOLN: 10.0000 mg | INTRAVENOUS | Status: DC | PRN

## 2020-05-04 SURGICAL SUPPLY — 33 items
BALLN STERLING OTW 3X40X150 (BALLOONS) ×2
BALLN STERLING OTW 5X100X135 (BALLOONS) ×2
BALLN STERLING SL OTW 4X80X150 (BALLOONS) ×2
BALLOON STERLING OTW 3X40X150 (BALLOONS) ×1 IMPLANT
BALLOON STERLING OTW 5X100X135 (BALLOONS) ×1 IMPLANT
BALLOON STRLNG SL OTW 4X80X150 (BALLOONS) ×1 IMPLANT
CATH CXI 2.6F 65 ST (CATHETERS) ×2
CATH OMNI FLUSH 5F 65CM (CATHETERS) ×2 IMPLANT
CATH QUICKCROSS .018X135CM (MICROCATHETER) ×2 IMPLANT
CATH QUICKCROSS .035X135CM (MICROCATHETER) ×2 IMPLANT
CATH SPRT STRG 65X2.6FR ACPT (CATHETERS) ×1 IMPLANT
CLOSURE PERCLOSE PROSTYLE (VASCULAR PRODUCTS) ×4 IMPLANT
DEVICE ONE SNARE 10MM (MISCELLANEOUS) ×2 IMPLANT
DEVICE TORQUE H2O (MISCELLANEOUS) ×2 IMPLANT
GLIDEWIRE ADV .035X260CM (WIRE) ×2 IMPLANT
GUIDEWIRE ZILIENT 6G 014 (WIRE) ×2 IMPLANT
GUIDEWIRE ZILIENT 6G 018 (WIRE) ×2 IMPLANT
KIT ENCORE 26 ADVANTAGE (KITS) ×2 IMPLANT
KIT MICROPUNCTURE NIT STIFF (SHEATH) ×2 IMPLANT
KIT PV (KITS) ×2 IMPLANT
PATCH THROMBIX TOPICAL PLAIN (HEMOSTASIS) ×2 IMPLANT
SHEATH GLIDE SLENDER 4/5FR (SHEATH) ×2 IMPLANT
SHEATH GUIDING CAROTID 6FRX90 (SHEATH) ×2 IMPLANT
SHEATH MICROPUNCTURE PEDAL 4FR (SHEATH) ×2 IMPLANT
SHEATH PINNACLE 5F 10CM (SHEATH) ×2 IMPLANT
SHEATH PINNACLE MP 6F 45CM (SHEATH) ×2 IMPLANT
SHEATH PROBE COVER 6X72 (BAG) ×2 IMPLANT
STENT ELUVIA 6X100X130 (Permanent Stent) ×2 IMPLANT
STENT INNOVA 5X80X130 (Permanent Stent) ×2 IMPLANT
SYR MEDRAD MARK 7 150ML (SYRINGE) ×2 IMPLANT
TRANSDUCER W/STOPCOCK (MISCELLANEOUS) ×2 IMPLANT
TRAY PV CATH (CUSTOM PROCEDURE TRAY) ×2 IMPLANT
WIRE G V18X300CM (WIRE) ×2 IMPLANT

## 2020-05-04 NOTE — Op Note (Addendum)
DATE OF SERVICE: 05/04/2020  PATIENT:  Lynn Brown  86 y.o. female  PRE-OPERATIVE DIAGNOSIS:  Atherosclerosis of native vessels of left lower extremity with ulceration Chronic venous insufficiency  POST-OPERATIVE DIAGNOSIS:  same  PROCEDURE:   1) US guided RCFA and L anterior tibial artery access 2) Aortogram 3) Bilateral lower extremity angiogram with third order cannulation (total contrast ) 4) Stenting L popliteal occlusion (5x24mm Innova) 5) Stenting L SFA (6x172mm Eluvia)  SURGEON:  Surgeon(s) and Role:    * Leonie Douglas, MD - Primary  ASSISTANT: none  ANESTHESIA:   local and IV sedation  EBL: min  BLOOD ADMINISTERED:none  DRAINS: none   LOCAL MEDICATIONS USED:  LIDOCAINE   SPECIMEN:  none  COUNTS: confirmed correct.  TOURNIQUET:  * No tourniquets in log *  PATIENT DISPOSITION:  PACU - hemodynamically stable.   Delay start of Pharmacological VTE agent (>24hrs) due to surgical blood loss or risk of bleeding: no  INDICATION FOR PROCEDURE: Lynn Brown is a 86 y.o. female with chronic venous medial malleolus ulceration in setting of severe PAD (ABI 0.3). After careful discussion of risks, benefits, and alternatives the patient was offered angiogram. We specifically discussed access site complications. The patient understood and wished to proceed.  OPERATIVE FINDINGS:  Aorta / iliacs: no hemodynamically significant stenosis Bilateral common femoral / profunda femoral / proximal superficial femoral arteries: no hemodynamically significant stenosis Mid SFA: >60% left SFA stenosis; <50% right SFA stenosis Popliteal: L short segment occlusion; R no hemodynamically significant stenosis. Tibials: three vessel runoff to feet bilaterally  DESCRIPTION OF PROCEDURE: After identification of the patient in the pre-operative holding area, the patient was transferred to the operating room. The patient was positioned supine on the operating room table. Anesthesia  was induced. The groins and left leg were prepped and draped in standard fashion. A surgical pause was performed confirming correct patient, procedure, and operative location.  To begin the right groin was anesthetized with 1% lidocaine. Using ultrasound guidance, the right common femoral artery was accessed with micropuncture technique. Fluoroscopy was used to confirm cannulation over the femoral head. Sheathogram was not performed. The 51F sheath was upsized to 49F.   An 035 glidewire advantage was advanced into the distal aorta. Over the wire an omni flush catheter was advanced to the level of L2. Aortogram was performed. See above for details.   The left common iliac artery was selected with the glidewire. The wire was advanced into the superficial femoral artery. Over the wire the omni flush catheter was advanced into the external iliac artery. Selective angiography was performed revealing. See above for details.  The decision was made to intervene. The patient was heparinized with 8000 units of heparin. ACT monitoring was used throughout the case to ensure adequate heparinization. The sheath was exchanged for a 51F x 90cm sheath. I was unable to cross the popliteal lesion antegrade.  the left ankle was anesthetized with 1% lidocaine. Using ultrasound guidance, the left anterior tibial artery was accessed with micropuncture technique. Fluoroscopy was used to confirm cannulation. Sheathogram was not performed. A 51F sheath was placed into the artery.  Using an 018 wire and crossing catheter I was able to cross the popliteal lesion retrograde. The 018 wire was snared and externalized through the right common femoral artery access.   The popliteal artery occlusion was predilated with a 3x71mm balloon. The artery was stented with a 5x4mm Innova stent and post dilated with a 4x80 balloon. No residual was noted  on completion.  The left SFA lesion was treated with a 6x156mm Eluvia stent and post dilated  with a 5x132mm balloon. No residual was noted on completion.  A perclose device was used to close the arteriotomy. Manual pressure was held on the anterior tibial access. Hemostasis was excellent upon completion.  Upon completion of the case instrument and sharps counts were confirmed correct. The patient was transferred to the PACU in good condition. I was present for all portions of the procedure.  Rande Brunt. Lenell Antu, MD [facility] Phone Number: [phone] 05/04/2020 1:27 PM

## 2020-05-04 NOTE — Progress Notes (Signed)
Up and walked and tolerated well; left lower leg dressing changed; no bleeding or hematoma right groin or left lower leg; Dr Blase Mess in to see client

## 2020-05-04 NOTE — Interval H&P Note (Signed)
History and Physical Interval Note:  05/04/2020 11:14 AM  Lynn Brown  has presented today for surgery, with the diagnosis of PAD.  The various methods of treatment have been discussed with the patient and family. After consideration of risks, benefits and other options for treatment, the patient has consented to  Procedure(s): ABDOMINAL AORTOGRAM W/LOWER EXTREMITY (Left) as a surgical intervention.  The patient's history has been reviewed, patient examined, no change in status, stable for surgery.  I have reviewed the patient's chart and labs.  Questions were answered to the patient's satisfaction.     Leonie Douglas

## 2020-05-04 NOTE — Discharge Instructions (Signed)
Femoral Site Care This sheet gives you information about how to care for yourself after your procedure. Your health care provider may also give you more specific instructions. If you have problems or questions, contact your health care provider. What can I expect after the procedure? After the procedure, it is common to have:  Bruising that usually fades within 1-2 weeks.  Tenderness at the site. Follow these instructions at home: Wound care  Follow instructions from your health care provider about how to take care of your insertion site. Make sure you: ? Wash your hands with soap and water before you change your bandage (dressing). If soap and water are not available, use hand sanitizer. ? Change your dressing as told by your health care provider. ? Leave stitches (sutures), skin glue, or adhesive strips in place. These skin closures may need to stay in place for 2 weeks or longer. If adhesive strip edges start to loosen and curl up, you may trim the loose edges. Do not remove adhesive strips completely unless your health care provider tells you to do that.  Do not take baths, swim, or use a hot tub until your health care provider approves.  You may shower 24-48 hours after the procedure or as told by your health care provider. ? Gently wash the site with plain soap and water. ? Pat the area dry with a clean towel. ? Do not rub the site. This may cause bleeding.  Do not apply powder or lotion to the site. Keep the site clean and dry.  Check your femoral site every day for signs of infection. Check for: ? Redness, swelling, or pain. ? Fluid or blood. ? Warmth. ? Pus or a bad smell. Activity  For the first 2-3 days after your procedure, or as long as directed: ? Avoid climbing stairs as much as possible. ? Do not squat.  Do not lift anything that is heavier than 10 lb (4.5 kg), or the limit that you are told, until your health care provider says that it is safe.  Rest as  directed. ? Avoid sitting for a long time without moving. Get up to take short walks every 1-2 hours.  Do not drive for 24 hours if you were given a medicine to help you relax (sedative). General instructions  Take over-the-counter and prescription medicines only as told by your health care provider.  Keep all follow-up visits as told by your health care provider. This is important. Contact a health care provider if you have:  A fever or chills.  You have redness, swelling, or pain around your insertion site. Get help right away if:  The catheter insertion area swells very fast.  You pass out.  You suddenly start to sweat or your skin gets clammy.  The catheter insertion area is bleeding, and the bleeding does not stop when you hold steady pressure on the area.  The area near or just beyond the catheter insertion site becomes pale, cool, tingly, or numb. These symptoms may represent a serious problem that is an emergency. Do not wait to see if the symptoms will go away. Get medical help right away. Call your local emergency services (911 in the [place] Do not drive yourself to the hospital. Summary  After the procedure, it is common to have bruising that usually fades within 1-2 weeks.  Check your femoral site every day for signs of infection.  Do not lift anything that is heavier than 10 lb (4.5 kg), or the   limit that you are told, until your health care provider says that it is safe. This information is not intended to replace advice given to you by your health care provider. Make sure you discuss any questions you have with your health care provider. Document Revised: 05/27/2017 Document Reviewed: 05/27/2017 Elsevier Patient Education  2020 [employer]  

## 2020-05-05 ENCOUNTER — Encounter (HOSPITAL_COMMUNITY): Payer: Self-pay | Admitting: Vascular Surgery

## 2020-05-06 ENCOUNTER — Ambulatory Visit
Admission: RE | Admit: 2020-05-06 | Discharge: 2020-05-06 | Disposition: A | Discharge status: Home / Self Care | Payer: Medicare Other | Source: Ambulatory Visit | Attending: Family Medicine | Admitting: Family Medicine

## 2020-05-06 ENCOUNTER — Other Ambulatory Visit: Payer: Self-pay

## 2020-05-06 DIAGNOSIS — Z1231 Encounter for screening mammogram for malignant neoplasm of breast: Secondary | ICD-10-CM

## 2020-06-14 ENCOUNTER — Other Ambulatory Visit: Payer: Self-pay | Admitting: *Deleted

## 2020-06-14 DIAGNOSIS — M79606 Pain in leg, unspecified: Secondary | ICD-10-CM

## 2020-06-14 DIAGNOSIS — M810 Age-related osteoporosis without current pathological fracture: Secondary | ICD-10-CM | POA: Diagnosis not present

## 2020-06-14 DIAGNOSIS — I10 Essential (primary) hypertension: Secondary | ICD-10-CM | POA: Diagnosis not present

## 2020-06-14 DIAGNOSIS — I70222 Atherosclerosis of native arteries of extremities with rest pain, left leg: Secondary | ICD-10-CM

## 2020-06-14 DIAGNOSIS — I2510 Atherosclerotic heart disease of native coronary artery without angina pectoris: Secondary | ICD-10-CM | POA: Diagnosis not present

## 2020-06-14 DIAGNOSIS — E039 Hypothyroidism, unspecified: Secondary | ICD-10-CM | POA: Diagnosis not present

## 2020-06-14 DIAGNOSIS — M1711 Unilateral primary osteoarthritis, right knee: Secondary | ICD-10-CM | POA: Diagnosis not present

## 2020-06-14 DIAGNOSIS — I872 Venous insufficiency (chronic) (peripheral): Secondary | ICD-10-CM

## 2020-06-14 DIAGNOSIS — E785 Hyperlipidemia, unspecified: Secondary | ICD-10-CM | POA: Diagnosis not present

## 2020-06-14 DIAGNOSIS — I5032 Chronic diastolic (congestive) heart failure: Secondary | ICD-10-CM | POA: Diagnosis not present

## 2020-06-30 ENCOUNTER — Ambulatory Visit (INDEPENDENT_AMBULATORY_CARE_PROVIDER_SITE_OTHER): Payer: Medicare Other | Admitting: Physician Assistant

## 2020-06-30 ENCOUNTER — Ambulatory Visit (INDEPENDENT_AMBULATORY_CARE_PROVIDER_SITE_OTHER)
Admission: RE | Admit: 2020-06-30 | Discharge: 2020-06-30 | Disposition: A | Discharge status: Home / Self Care | Payer: Medicare Other | Source: Ambulatory Visit | Attending: Vascular Surgery | Admitting: Vascular Surgery

## 2020-06-30 ENCOUNTER — Ambulatory Visit (HOSPITAL_COMMUNITY)
Admission: RE | Admit: 2020-06-30 | Discharge: 2020-06-30 | Disposition: A | Discharge status: Home / Self Care | Payer: Medicare Other | Source: Ambulatory Visit | Attending: Vascular Surgery | Admitting: Vascular Surgery

## 2020-06-30 ENCOUNTER — Other Ambulatory Visit: Payer: Self-pay

## 2020-06-30 VITALS — BP 136/86 | HR 67 | Temp 98.0°F | Resp 20 | Ht 60.0 in | Wt 190.4 lb

## 2020-06-30 DIAGNOSIS — I70222 Atherosclerosis of native arteries of extremities with rest pain, left leg: Secondary | ICD-10-CM

## 2020-06-30 DIAGNOSIS — I872 Venous insufficiency (chronic) (peripheral): Secondary | ICD-10-CM

## 2020-06-30 DIAGNOSIS — M79606 Pain in leg, unspecified: Secondary | ICD-10-CM | POA: Insufficient documentation

## 2020-06-30 DIAGNOSIS — M79605 Pain in left leg: Secondary | ICD-10-CM

## 2020-06-30 NOTE — Progress Notes (Signed)
Office Note     CC:  follow up Requesting Provider:  Laurann Montana, MD  HPI: Lynn Brown is a 87 y.o. (01/29/1932) female who presents for follow-up status post bilateral lower extremity angiography on May 04, 2020 by Dr. Lenell Antu with stenting L popliteal occlusion (5x21mm Innova) and stenting L SFA (6x14mm Eluvia).   Her daughter accompanies her today.  She has chronic right knee pain and pain with walking. She uses a cane when outside of the home. She is compliant with Plavix, statin and aspirin.  Hypertension on CCB, diuretic and BB. No history of tobacco use.  Past Medical History:  Diagnosis Date  . Allergic rhinitis   . Anxiety   . Bunion    PES PLANUS  . Cervical disc disease   . Hearing loss    HEARING AIDS, BUT DOES NOT WEAR THEM CONSISTENTLY.   Marland Kitchen Hypercholesteremia   . Hypertension   . Hypothyroid   . Leaky heart valve    DR. HARWANI  . Mild obesity   . Osteoarthritis of knees, bilateral   . Osteoporosis 12/2017   T score -3.3  . Venous insufficiency     Past Surgical History:  Procedure Laterality Date  . ABDOMINAL AORTOGRAM W/LOWER EXTREMITY Left 05/04/2020   Procedure: ABDOMINAL AORTOGRAM W/LOWER EXTREMITY;  Surgeon: Leonie Douglas, MD;  Location: [facility];  Service: Cardiovascular;  Laterality: Left;  . CATARACT EXTRACTION, BILATERAL  2013   DR. SHAPIRO   . HYSTEROSCOPY     D & C  . LEFT HEART CATH AND CORONARY ANGIOGRAPHY N/A 03/10/2018   Procedure: LEFT HEART CATH AND CORONARY ANGIOGRAPHY;  Surgeon: Lyn Records, MD;  Location: [facility];  Service: Cardiovascular;  Laterality: N/A;  . NEPHRECTOMY LIVING DONOR  1978  . PERIPHERAL VASCULAR INTERVENTION Left 05/04/2020   Procedure: PERIPHERAL VASCULAR INTERVENTION;  Surgeon: Leonie Douglas, MD;  Location: [facility];  Service: Cardiovascular;  Laterality: Left;  Femoral popliteal    Social History   Socioeconomic History  . Marital status: Widowed    Spouse  name: Not on file  . Number of children: 4  . Years of education: Not on file  . Highest education level: Not on file  Occupational History  . Occupation: RETIRED  Tobacco Use  . Smoking status: Never Smoker  . Smokeless tobacco: Never Used  Vaping Use  . Vaping Use: Never used  Substance and Sexual Activity  . Alcohol use: No    Alcohol/week: 0.0 standard drinks  . Drug use: No  . Sexual activity: Never    Comment: 1st intercourse 87 yo-Fewer than 5 partners  Other Topics Concern  . Not on file  Social History Narrative  . Not on file   Social Determinants of Health   Financial Resource Strain: Not on file  Food Insecurity: Not on file  Transportation Needs: Not on file  Physical Activity: Not on file  Stress: Not on file  Social Connections: Not on file  Intimate Partner Violence: Not on file   Family History  Problem Relation Age of Onset  . Hypertension Mother   . CVA Mother   . Diabetes Sister   . Hypertension Sister   . Heart disease Sister   . Multiple sclerosis Sister   . Hypertension Father   . CVA Father   . Cancer Brother        prostrate  . Hypertension Brother   . CVA Brother   . Seizures  Brother   . Pancreatic cancer Sister   . Kidney disease Sister   . Kidney failure Sister   . Heart attack Sister   . Other Brother        PNEUMONIA AS A BABY  . Hypertension Brother   . Breast cancer Neg Hx     Current Outpatient Medications  Medication Sig Dispense Refill  . acetaminophen (TYLENOL) 500 MG tablet Take 500-1,000 mg by mouth every 6 (six) hours as needed (for pain).    Marland Kitchen amLODipine (NORVASC) 2.5 MG tablet TAKE 1 TABLET BY MOUTH  DAILY (Patient taking differently: Take 2.5 mg by mouth daily. ) 90 tablet 3  . ascorbic acid (VITAMIN C) 500 MG tablet Take 500 mg by mouth in the morning and at bedtime.    Marland Kitchen aspirin EC 81 MG tablet Take 1 tablet (81 mg total) by mouth daily. Swallow whole. 90 tablet 3  . cetirizine (ZYRTEC) 10 MG tablet Take 10 mg  by mouth daily as needed (allergies.).     Marland Kitchen Cholecalciferol (VITAMIN D) 2000 UNITS tablet Take 2,000 Units by mouth daily.      . clopidogrel (PLAVIX) 75 MG tablet Take 1 tablet (75 mg total) by mouth daily. 30 tablet 11  . clorazepate (TRANXENE) 7.5 MG tablet Take 7.5 mg by mouth daily as needed for anxiety.     . Coenzyme Q10 (COQ10) 50 MG CAPS Take 50 mg by mouth daily at 2 PM.    . levothyroxine (SYNTHROID) 112 MCG tablet Take 112 mcg by mouth every morning.    . magnesium oxide (MAG-OX) 400 MG tablet Take 400 mg by mouth every evening.     . metoprolol tartrate (LOPRESSOR) 25 MG tablet Take 0.5 tablets (12.5 mg total) by mouth 2 (two) times daily. 60 tablet 0  . nitroGLYCERIN (NITROSTAT) 0.4 MG SL tablet Place 1 tablet (0.4 mg total) under the tongue every 5 (five) minutes as needed for chest pain. (Patient taking differently: Place 0.4 mg under the tongue every 5 (five) minutes x 3 doses as needed for chest pain. ) 30 tablet 0  . Omega-3 Fatty Acids (FISH OIL PO) Take 1 tablet by mouth daily at 2 PM.     . rosuvastatin (CRESTOR) 20 MG tablet Take 20 mg by mouth every evening.     Marland Kitchen spironolactone (ALDACTONE) 25 MG tablet TAKE 1 TABLET BY MOUTH  DAILY (Patient taking differently: Take 25 mg by mouth daily. ) 90 tablet 3   No current facility-administered medications for this visit.    No Known Allergies   REVIEW OF SYSTEMS:   [X]  denotes positive finding, [ ]  denotes negative finding Cardiac  Comments:  Chest pain or chest pressure:    Shortness of breath upon exertion:    Short of breath when lying flat:    Irregular heart rhythm:        Vascular    Pain in calf, thigh, or hip brought on by ambulation: x   Pain in feet at night that wakes you up from your sleep:     Blood clot in your veins:    Leg swelling:  x       Pulmonary    Oxygen at home:    Productive cough:     Wheezing:         Neurologic    Sudden weakness in arms or legs:     Sudden numbness in arms or  legs:     Sudden onset of difficulty speaking  or slurred speech:    Temporary loss of vision in one eye:     Problems with dizziness:         Gastrointestinal    Blood in stool:     Vomited blood:         Genitourinary    Burning when urinating:     Blood in urine:        Psychiatric    Major depression:         Hematologic    Bleeding problems:    Problems with blood clotting too easily:        Skin    Rashes or ulcers:        Constitutional    Fever or chills:      PHYSICAL EXAMINATION:  Vitals:   06/30/20 1544  BP: 136/86  Pulse: 67  Resp: 20  Temp: 98 F (36.7 C)  SpO2: 98%   General:  WDWN in NAD; vital signs documented above Gait: with cane, slow but independent  HENT: WNL, normocephalic Pulmonary: normal non-labored breathing Cardiac: regular HR Skin: with stasis dermatitis Vascular Exam/Pulses: Femoral pulses difficult tp palpate due to obesity Extremities: without ischemic changes, without Gangrene , without cellulitis; without open wounds; + ankle edema Musculoskeletal: no muscle wasting or atrophy  Neurologic: A&O X 3;  No focal weakness or paresthesias are detected Psychiatric:  The pt has Normal affect.  RLE   Photo of LLE similar (did not save in Epic)  Non-Invasive Vascular Imaging:   06/30/2020 ABI/TBIToday's ABIToday's TBIPrevious ABIPrevious TBI  +-------+-----------+-----------+------------+------------+  Right 1.16    0.77    0.91    0.76      +-------+-----------+-----------+------------+------------+  Left  0.99    0.78    0.56    0.35      +-------+-----------+-----------+------------+------------+  Right: Resting right ankle-brachial index is within normal range using the  Dorsalis Pedis artery. No evidence of significant right lower extremity  arterial disease. The right toe-brachial index is normal.   Left: Resting left ankle-brachial index appears to be within normal range,   brisk monophasic waveforms noted, may be falsely elevated. The left  toe-brachial index is normal.   LLE arterial duplex: Left: Patent SFA and popliteal stents with no visualized stenosis, limited  visualization.   ASSESSMENT/PLAN:: 87 y.o. female here for follow up for recent left SFA and popliteal stenting. Patent stents with normal ABIs.  Venous insuffciency. Discussed compression stockings, elevation and will arrange formal venous reflux study  Milinda Antis, PA-C [facility] [phone]  Clinic MD:   Lenell Antu on call

## 2020-07-01 ENCOUNTER — Other Ambulatory Visit: Payer: Self-pay

## 2020-07-01 DIAGNOSIS — I872 Venous insufficiency (chronic) (peripheral): Secondary | ICD-10-CM

## 2020-07-01 DIAGNOSIS — I70222 Atherosclerosis of native arteries of extremities with rest pain, left leg: Secondary | ICD-10-CM

## 2020-07-11 ENCOUNTER — Other Ambulatory Visit: Payer: Self-pay | Admitting: Vascular Surgery

## 2020-07-11 DIAGNOSIS — M79606 Pain in leg, unspecified: Secondary | ICD-10-CM

## 2020-07-18 ENCOUNTER — Encounter (INDEPENDENT_AMBULATORY_CARE_PROVIDER_SITE_OTHER): Payer: Self-pay | Admitting: Ophthalmology

## 2020-07-18 ENCOUNTER — Other Ambulatory Visit: Payer: Self-pay

## 2020-07-18 ENCOUNTER — Ambulatory Visit (INDEPENDENT_AMBULATORY_CARE_PROVIDER_SITE_OTHER): Payer: Medicare Other | Admitting: Ophthalmology

## 2020-07-18 DIAGNOSIS — H33101 Unspecified retinoschisis, right eye: Secondary | ICD-10-CM

## 2020-07-18 DIAGNOSIS — H43821 Vitreomacular adhesion, right eye: Secondary | ICD-10-CM | POA: Diagnosis not present

## 2020-07-18 DIAGNOSIS — H43822 Vitreomacular adhesion, left eye: Secondary | ICD-10-CM

## 2020-07-18 NOTE — Assessment & Plan Note (Signed)
Vitreomacular adhesion and traction of the retina in the right eye with impairment of acuity.  May consider vitrectomy with release of this so as to prevent progression of vision loss but also a chance to recover acuity in the right eye

## 2020-07-18 NOTE — Assessment & Plan Note (Signed)
Minor, no visual impact observe

## 2020-07-18 NOTE — Progress Notes (Signed)
07/18/2020     CHIEF COMPLAINT Patient presents for Retina Follow Up (3 MO FU OU///Pt reports stable vision OU. Pt denies any new F/F, pain, or pressure OU. )   HISTORY OF PRESENT ILLNESS: Lynn Brown is a 87 y.o. female who presents to the clinic today for:   HPI    Retina Follow Up    Patient presents with  Other.  In right eye.  This started 3 months ago.  Duration of 3 months.  Since onset it is stable. Additional comments: 3 MO FU OU   Pt reports stable vision OU. Pt denies any new F/F, pain, or pressure OU.        Last edited by Donne Hazel on 07/18/2020 10:53 AM. (History)      Referring physician: Harlan Stains, MD [place],  [place] [id number]  HISTORICAL INFORMATION:   Selected notes from the [place]: No current outpatient medications on file. (Ophthalmic Drugs)   No current facility-administered medications for this visit. (Ophthalmic Drugs)   Current Outpatient Medications (Other)  Medication Sig  . acetaminophen (TYLENOL) 500 MG tablet Take 500-1,000 mg by mouth every 6 (six) hours as needed (for pain).  Marland Kitchen amLODipine (NORVASC) 2.5 MG tablet TAKE 1 TABLET BY MOUTH  DAILY (Patient taking differently: Take 2.5 mg by mouth daily.)  . ascorbic acid (VITAMIN C) 500 MG tablet Take 500 mg by mouth in the morning and at bedtime.  Marland Kitchen aspirin EC 81 MG tablet Take 1 tablet (81 mg total) by mouth daily. Swallow whole.  . Calcium Citrate-Vitamin D 315-250 MG-UNIT TABS 1 tablet  . cetirizine (ZYRTEC) 10 MG tablet Take 10 mg by mouth daily as needed (allergies.).   Marland Kitchen Cholecalciferol (VITAMIN D) 2000 UNITS tablet Take 2,000 Units by mouth daily.  . clopidogrel (PLAVIX) 75 MG tablet Take 1 tablet (75 mg total) by mouth daily.  . clorazepate (TRANXENE) 7.5 MG tablet Take 7.5 mg by mouth daily as needed for anxiety.   . Coenzyme Q10 (COQ10) 50 MG CAPS Take 50 mg by mouth daily at 2 PM.  .  levothyroxine (SYNTHROID) 125 MCG tablet Take by mouth.  . magnesium oxide (MAG-OX) 400 MG tablet Take 400 mg by mouth every evening.   . metoprolol tartrate (LOPRESSOR) 25 MG tablet Take 0.5 tablets (12.5 mg total) by mouth 2 (two) times daily.  . nitroGLYCERIN (NITROSTAT) 0.4 MG SL tablet Place 1 tablet (0.4 mg total) under the tongue every 5 (five) minutes as needed for chest pain. (Patient taking differently: Place 0.4 mg under the tongue every 5 (five) minutes x 3 doses as needed for chest pain.)  . Omega-3 Fatty Acids (FISH OIL PO) Take 1 tablet by mouth daily at 2 PM.   . rosuvastatin (CRESTOR) 20 MG tablet Take 20 mg by mouth every evening.   Marland Kitchen spironolactone (ALDACTONE) 25 MG tablet TAKE 1 TABLET BY MOUTH  DAILY (Patient taking differently: Take 25 mg by mouth daily.)   No current facility-administered medications for this visit. (Other)      REVIEW OF SYSTEMS:    ALLERGIES No Known Allergies  PAST MEDICAL HISTORY Past Medical History:  Diagnosis Date  . Allergic rhinitis   . Anxiety   . Bunion    PES PLANUS  . Cervical disc disease   . Hearing loss    HEARING AIDS, BUT DOES NOT WEAR THEM CONSISTENTLY.   Marland Kitchen Hypercholesteremia   .  Hypertension   . Hypothyroid   . Leaky heart valve    DR. HARWANI  . Mild obesity   . Osteoarthritis of knees, bilateral   . Osteoporosis 12/2017   T score -3.3  . Venous insufficiency    Past Surgical History:  Procedure Laterality Date  . ABDOMINAL AORTOGRAM W/LOWER EXTREMITY Left 05/04/2020   Procedure: ABDOMINAL AORTOGRAM W/LOWER EXTREMITY;  Surgeon: Cherre Robins, MD;  Location: [facility];  Service: Cardiovascular;  Laterality: Left;  . CATARACT EXTRACTION, BILATERAL  2013   DR. SHAPIRO   . HYSTEROSCOPY     D & C  . LEFT HEART CATH AND CORONARY ANGIOGRAPHY N/A 03/10/2018   Procedure: LEFT HEART CATH AND CORONARY ANGIOGRAPHY;  Surgeon: Belva Crome, MD;  Location: [facility];  Service: Cardiovascular;   Laterality: N/A;  . NEPHRECTOMY LIVING DONOR  1978  . PERIPHERAL VASCULAR INTERVENTION Left 05/04/2020   Procedure: PERIPHERAL VASCULAR INTERVENTION;  Surgeon: Cherre Robins, MD;  Location: [facility];  Service: Cardiovascular;  Laterality: Left;  Femoral popliteal    FAMILY HISTORY Family History  Problem Relation Age of Onset  . Hypertension Mother   . CVA Mother   . Diabetes Sister   . Hypertension Sister   . Heart disease Sister   . Multiple sclerosis Sister   . Hypertension Father   . CVA Father   . Cancer Brother        prostrate  . Hypertension Brother   . CVA Brother   . Seizures Brother   . Pancreatic cancer Sister   . Kidney disease Sister   . Kidney failure Sister   . Heart attack Sister   . Other Brother        PNEUMONIA AS A BABY  . Hypertension Brother   . Breast cancer Neg Hx     SOCIAL HISTORY Social History   Tobacco Use  . Smoking status: Never Smoker  . Smokeless tobacco: Never Used  Vaping Use  . Vaping Use: Never used  Substance Use Topics  . Alcohol use: No    Alcohol/week: 0.0 standard drinks  . Drug use: No         OPHTHALMIC EXAM:  Base Eye Exam    Visual Acuity (ETDRS)      Right Left   Dist cc 20/60 -1 20/30 -2   Dist ph cc NI NI   Correction: Glasses       Tonometry (Tonopen, 10:59 AM)      Right Left   Pressure 14 14       Pupils      Pupils Dark Light Shape React APD   Right PERRL 5 4 Round Brisk None   Left PERRL 5 4 Round Brisk None       Visual Fields (Counting fingers)      Left Right    Full Full       Extraocular Movement      Right Left    Full Full       Neuro/Psych    Oriented x3: Yes   Mood/Affect: Normal       Dilation    Both eyes: 1.0% Mydriacyl, 2.5% Phenylephrine @ 10:59 AM        Slit Lamp and Fundus Exam    External Exam      Right Left   External Normal Normal       Slit Lamp Exam      Right Left  Lids/Lashes Normal Normal   Conjunctiva/Sclera White and quiet  White and quiet   Cornea Clear Clear   Anterior Chamber Deep and quiet Deep and quiet   Iris Round and reactive Round and reactive   Lens Posterior chamber intraocular lens Posterior chamber intraocular lens   Anterior Vitreous Normal Normal       Fundus Exam      Right Left   Posterior Vitreous Normal Normal   Disc Normal Normal   C/D Ratio 0.45 0.5   Macula Pseudocystoid CME Normal   Vessels Normal Normal   Periphery Normal Normal          IMAGING AND PROCEDURES  Imaging and Procedures for 07/18/20  OCT, Retina - OU - Both Eyes       Right Eye Quality was good. Scan locations included subfoveal. Central Foveal Thickness: 297. Findings include vitreomacular adhesion , cystoid macular edema.   Left Eye Quality was good. Scan locations included subfoveal. Central Foveal Thickness: 272. Progression has been stable. Findings include vitreomacular adhesion .   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 Vitreomacular traction, right  H43.821 OCT, Retina - OU - Both Eyes  2. Macular retinoschisis, right  H33.101   3. Vitreomacular adhesion of left eye   H43.822     1.  Findings of the right eye were reviewed displayed and discussed with the patient and family noting that the tugging the traction on the center of the vision of the right eye has not improved and in fact is triggering schisis, splitting of the retina which has an impact visual acuity and potentially worsening over the future without intervention.  Based upon these findings only in the right I would I recommend a vitrectomy to release the vitreomacular traction.  I will explained to the patient and family that this follows the procedure, experiential he much like cataract surgery, yet technically involving the vitreous material which is behind the lens implant.  Removal of this material releases the tugging and potentially could flatten the retina and preserve if not improved through the acuity in the right eye to its maximal capability.  This procedure typically takes 5 to 7 minutes of surgical time as an outpatient.  2.  Upon discussion patient would like to proceed so as to have maximal chance of visual acuity stabilization and improvement.  3.  We will schedule in a nonemergent fashion.  I explained to the patient I typically operate on Wednesday afternoons.  We we will schedule on a Wednesday afternoon, first time at [place] Ordered this visit:  No orders  of the defined types were placed in this encounter.      Return , [facility], for Schedule vitrectomy right eye for VMT.  There are no Patient Instructions on file for this visit.   Explained the diagnoses, plan, and follow up with the patient and they expressed understanding.  Patient expressed understanding of the importance of proper follow up care.   Clent Demark Duanne Duchesne M.D. Diseases & Surgery of the Retina and Vitreous [facility]ina [facility] Diabetic [place] 07/18/20     Abbreviations: M myopia (nearsighted); A astigmatism; H hyperopia (farsighted); P presbyopia; Mrx spectacle prescription;  CTL  contact lenses; OD right eye; OS left eye; OU both eyes  XT exotropia; ET esotropia; PEK punctate epithelial keratitis; PEE punctate epithelial erosions; DES dry eye syndrome; MGD meibomian gland dysfunction; ATs artificial tears; PFAT's preservative free artificial tears; Posey nuclear sclerotic cataract; PSC posterior subcapsular cataract; ERM epi-retinal membrane; PVD posterior vitreous detachment; RD retinal detachment; DM diabetes mellitus; DR diabetic retinopathy; NPDR non-proliferative diabetic retinopathy; PDR proliferative diabetic retinopathy; CSME clinically significant macular edema; DME diabetic macular edema; dbh dot blot hemorrhages; CWS cotton wool spot; POAG primary open angle glaucoma; C/D cup-to-disc ratio; HVF humphrey visual field; GVF goldmann visual field; OCT optical coherence tomography; IOP intraocular pressure; BRVO Branch retinal vein occlusion; CRVO central retinal vein occlusion; CRAO central retinal artery occlusion; BRAO branch retinal artery occlusion; RT retinal tear; SB scleral buckle; PPV pars plana vitrectomy; VH Vitreous hemorrhage; PRP panretinal laser photocoagulation; IVK intravitreal kenalog; VMT vitreomacular traction; MH Macular hole;  NVD neovascularization of the disc; NVE neovascularization elsewhere; AREDS age related eye disease study; ARMD age related macular degeneration; POAG primary open angle glaucoma; EBMD epithelial/anterior basement membrane dystrophy; ACIOL anterior chamber intraocular lens; IOL intraocular lens; PCIOL posterior chamber intraocular lens; Phaco/IOL phacoemulsification with intraocular lens placement; [place] photorefractive keratectomy; LASIK laser assisted in situ keratomileusis; HTN hypertension; DM diabetes mellitus; COPD chronic obstructive pulmonary disease

## 2020-07-18 NOTE — Assessment & Plan Note (Signed)
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

## 2020-07-25 DIAGNOSIS — I5032 Chronic diastolic (congestive) heart failure: Secondary | ICD-10-CM | POA: Diagnosis not present

## 2020-07-25 DIAGNOSIS — E039 Hypothyroidism, unspecified: Secondary | ICD-10-CM | POA: Diagnosis not present

## 2020-07-25 DIAGNOSIS — I2510 Atherosclerotic heart disease of native coronary artery without angina pectoris: Secondary | ICD-10-CM | POA: Diagnosis not present

## 2020-07-25 DIAGNOSIS — E785 Hyperlipidemia, unspecified: Secondary | ICD-10-CM | POA: Diagnosis not present

## 2020-07-25 DIAGNOSIS — M810 Age-related osteoporosis without current pathological fracture: Secondary | ICD-10-CM | POA: Diagnosis not present

## 2020-07-25 DIAGNOSIS — I10 Essential (primary) hypertension: Secondary | ICD-10-CM | POA: Diagnosis not present

## 2020-07-25 DIAGNOSIS — M1711 Unilateral primary osteoarthritis, right knee: Secondary | ICD-10-CM | POA: Diagnosis not present

## 2020-07-27 ENCOUNTER — Other Ambulatory Visit: Payer: Self-pay

## 2020-07-27 ENCOUNTER — Encounter (INDEPENDENT_AMBULATORY_CARE_PROVIDER_SITE_OTHER): Payer: Self-pay

## 2020-07-27 ENCOUNTER — Ambulatory Visit (INDEPENDENT_AMBULATORY_CARE_PROVIDER_SITE_OTHER): Payer: Medicare Other

## 2020-07-27 DIAGNOSIS — H43821 Vitreomacular adhesion, right eye: Secondary | ICD-10-CM

## 2020-07-27 MED ORDER — OFLOXACIN 0.3 % OP SOLN: 1.0000 [drp] | Freq: Four times a day (QID) | OPHTHALMIC | 0 refills | Status: AC

## 2020-07-27 MED ORDER — PREDNISOLONE ACETATE 1 % OP SUSP: 1.0000 [drp] | Freq: Four times a day (QID) | OPHTHALMIC | 0 refills | Status: AC

## 2020-07-27 NOTE — Progress Notes (Signed)
07/27/2020     CHIEF COMPLAINT Patient presents for Pre-op Exam (Pre op OD for PPV 08/03/2020)   HISTORY OF PRESENT ILLNESS: Lynn Brown is a 87 y.o. female who presents to the clinic today for:   HPI    Pre-op Exam     Additional comments: Pre op OD for PPV 08/03/2020       Last edited by Ileana Roup, COA on 07/27/2020  1:19 PM. (History)        HISTORICAL INFORMATION:   Selected notes from the MEDICAL RECORD NUMBER       CURRENT MEDICATIONS: Current Outpatient Medications (Ophthalmic Drugs)  Medication Sig  . ofloxacin (OCUFLOX) 0.3 % ophthalmic solution Place 1 drop into the right eye 4 (four) times daily for 21 days.  . prednisoLONE acetate (PRED FORTE) 1 % ophthalmic suspension Place 1 drop into the right eye 4 (four) times daily for 21 days.   No current facility-administered medications for this visit. (Ophthalmic Drugs)   Current Outpatient Medications (Other)  Medication Sig  . acetaminophen (TYLENOL) 500 MG tablet Take 500-1,000 mg by mouth every 6 (six) hours as needed (for pain).  Marland Kitchen amLODipine (NORVASC) 2.5 MG tablet TAKE 1 TABLET BY MOUTH  DAILY (Patient taking differently: Take 2.5 mg by mouth daily.)  . ascorbic acid (VITAMIN C) 500 MG tablet Take 500 mg by mouth in the morning and at bedtime.  Marland Kitchen aspirin EC 81 MG tablet Take 1 tablet (81 mg total) by mouth daily. Swallow whole.  . Calcium Citrate-Vitamin D 315-250 MG-UNIT TABS 1 tablet  . cetirizine (ZYRTEC) 10 MG tablet Take 10 mg by mouth daily as needed (allergies.).   Marland Kitchen Cholecalciferol (VITAMIN D) 2000 UNITS tablet Take 2,000 Units by mouth daily.  . clopidogrel (PLAVIX) 75 MG tablet Take 1 tablet (75 mg total) by mouth daily.  . clorazepate (TRANXENE) 7.5 MG tablet Take 7.5 mg by mouth daily as needed for anxiety.   . Coenzyme Q10 (COQ10) 50 MG CAPS Take 50 mg by mouth daily at 2 PM.  . levothyroxine (SYNTHROID) 125 MCG tablet Take by mouth.  . magnesium oxide (MAG-OX) 400 MG tablet Take 400 mg  by mouth every evening.   . metoprolol tartrate (LOPRESSOR) 25 MG tablet Take 0.5 tablets (12.5 mg total) by mouth 2 (two) times daily.  . nitroGLYCERIN (NITROSTAT) 0.4 MG SL tablet Place 1 tablet (0.4 mg total) under the tongue every 5 (five) minutes as needed for chest pain. (Patient taking differently: Place 0.4 mg under the tongue every 5 (five) minutes x 3 doses as needed for chest pain.)  . Omega-3 Fatty Acids (FISH OIL PO) Take 1 tablet by mouth daily at 2 PM.   . rosuvastatin (CRESTOR) 20 MG tablet Take 20 mg by mouth every evening.   Marland Kitchen spironolactone (ALDACTONE) 25 MG tablet TAKE 1 TABLET BY MOUTH  DAILY (Patient taking differently: Take 25 mg by mouth daily.)   No current facility-administered medications for this visit. (Other)     ALLERGIES No Known Allergies  PAST MEDICAL HISTORY Past Medical History:  Diagnosis Date  . Allergic rhinitis   . Anxiety   . Bunion    PES PLANUS  . Cervical disc disease   . Hearing loss    HEARING AIDS, BUT DOES NOT WEAR THEM CONSISTENTLY.   Marland Kitchen Hypercholesteremia   . Hypertension   . Hypothyroid   . Leaky heart valve    DR. HARWANI  . Mild obesity   . Osteoarthritis of  knees, bilateral   . Osteoporosis 12/2017   T score -3.3  . Venous insufficiency    Past Surgical History:  Procedure Laterality Date  . ABDOMINAL AORTOGRAM W/LOWER EXTREMITY Left 05/04/2020   Procedure: ABDOMINAL AORTOGRAM W/LOWER EXTREMITY;  Surgeon: Leonie Douglas, MD;  Location: [facility];  Service: Cardiovascular;  Laterality: Left;  . CATARACT EXTRACTION, BILATERAL  2013   DR. SHAPIRO   . HYSTEROSCOPY     D & C  . LEFT HEART CATH AND CORONARY ANGIOGRAPHY N/A 03/10/2018   Procedure: LEFT HEART CATH AND CORONARY ANGIOGRAPHY;  Surgeon: Lyn Records, MD;  Location: [facility];  Service: Cardiovascular;  Laterality: N/A;  . NEPHRECTOMY LIVING DONOR  1978  . PERIPHERAL VASCULAR INTERVENTION Left 05/04/2020   Procedure: PERIPHERAL VASCULAR  INTERVENTION;  Surgeon: Leonie Douglas, MD;  Location: [facility];  Service: Cardiovascular;  Laterality: Left;  Femoral popliteal    FAMILY HISTORY Family History  Problem Relation Age of Onset  . Hypertension Mother   . CVA Mother   . Diabetes Sister   . Hypertension Sister   . Heart disease Sister   . Multiple sclerosis Sister   . Hypertension Father   . CVA Father   . Cancer Brother        prostrate  . Hypertension Brother   . CVA Brother   . Seizures Brother   . Pancreatic cancer Sister   . Kidney disease Sister   . Kidney failure Sister   . Heart attack Sister   . Other Brother        PNEUMONIA AS A BABY  . Hypertension Brother   . Breast cancer Neg Hx     SOCIAL HISTORY Social History   Tobacco Use  . Smoking status: Never Smoker  . Smokeless tobacco: Never Used  Vaping Use  . Vaping Use: Never used  Substance Use Topics  . Alcohol use: No    Alcohol/week: 0.0 standard drinks  . Drug use: No         OPHTHALMIC EXAM:  Base Eye Exam    Visual Acuity (ETDRS)      Right Left   Dist cc 20/60 -2 20/25 -1   Dist ph cc 20/50 -2    Correction: Glasses       Tonometry (Tonopen, 1:19 PM)      Right Left   Pressure 13 NT       Neuro/Psych    Oriented x3: Yes   Mood/Affect: Normal          IMAGING AND PROCEDURES  Imaging and Procedures for @TODAY @           ASSESSMENT/PLAN:  No diagnosis found.  Ophthalmic Meds Ordered this visit:  Meds ordered this encounter  Medications  . ofloxacin (OCUFLOX) 0.3 % ophthalmic solution    Sig: Place 1 drop into the right eye 4 (four) times daily for 21 days.    Dispense:  5 mL    Refill:  0  . prednisoLONE acetate (PRED FORTE) 1 % ophthalmic suspension    Sig: Place 1 drop into the right eye 4 (four) times daily for 21 days.    Dispense:  5 mL    Refill:  0        Pre-op completed. Operative consent obtained with pre-op eye drops reviewed with and sent via [facility] as  needed. Post op instructions reviewed with patient and per patient all questions answered.  [facility]  Garald Braver, COA

## 2020-08-03 ENCOUNTER — Encounter (AMBULATORY_SURGERY_CENTER): Payer: Medicare Other | Admitting: Ophthalmology

## 2020-08-03 DIAGNOSIS — H43821 Vitreomacular adhesion, right eye: Secondary | ICD-10-CM

## 2020-08-03 NOTE — Telephone Encounter (Signed)
APPT 03/09/2020 NEXT INJECTION 09/09/2019

## 2020-08-04 ENCOUNTER — Ambulatory Visit (INDEPENDENT_AMBULATORY_CARE_PROVIDER_SITE_OTHER): Payer: Medicare Other | Admitting: Ophthalmology

## 2020-08-04 ENCOUNTER — Encounter (INDEPENDENT_AMBULATORY_CARE_PROVIDER_SITE_OTHER): Payer: Self-pay | Admitting: Ophthalmology

## 2020-08-04 ENCOUNTER — Other Ambulatory Visit: Payer: Self-pay

## 2020-08-04 DIAGNOSIS — H43821 Vitreomacular adhesion, right eye: Secondary | ICD-10-CM

## 2020-08-04 NOTE — Assessment & Plan Note (Signed)
Ofloxacin  4 times daily to the operative eye  Prednisolone acetate 1 drop to the operative eye 4 times daily  Patient instructed not to refill the medications and use them for maximum of 3 weeks.  Patient instructed do not rub the eye.  Patient has the option to use the patch at night. 

## 2020-08-04 NOTE — Progress Notes (Signed)
08/04/2020     CHIEF COMPLAINT Patient presents for Post-op Follow-up (1 Day POV OD//Pt denies ocular pain. No nausea or vomiting.)   HISTORY OF PRESENT ILLNESS: Lynn Brown is a 87 y.o. female who presents to the clinic today for:   HPI    Post-op Follow-up    In right eye. Additional comments: 1 Day POV OD  Pt denies ocular pain. No nausea or vomiting.       Last edited by Ileana Roup, COA on 08/04/2020  8:17 AM. (History)      Referring physician: Laurann Montana, MD [phone] [address] [place],  [place] [address]  HISTORICAL INFORMATION:   Selected notes from the MEDICAL RECORD NUMBER       CURRENT MEDICATIONS: Current Outpatient Medications (Ophthalmic Drugs)  Medication Sig  . ofloxacin (OCUFLOX) 0.3 % ophthalmic solution Place 1 drop into the right eye 4 (four) times daily for 21 days.  . prednisoLONE acetate (PRED FORTE) 1 % ophthalmic suspension Place 1 drop into the right eye 4 (four) times daily for 21 days.   No current facility-administered medications for this visit. (Ophthalmic Drugs)   Current Outpatient Medications (Other)  Medication Sig  . acetaminophen (TYLENOL) 500 MG tablet Take 500-1,000 mg by mouth every 6 (six) hours as needed (for pain).  Marland Kitchen amLODipine (NORVASC) 2.5 MG tablet TAKE 1 TABLET BY MOUTH  DAILY (Patient taking differently: Take 2.5 mg by mouth daily.)  . ascorbic acid (VITAMIN C) 500 MG tablet Take 500 mg by mouth in the morning and at bedtime.  Marland Kitchen aspirin EC 81 MG tablet Take 1 tablet (81 mg total) by mouth daily. Swallow whole.  . Calcium Citrate-Vitamin D 315-250 MG-UNIT TABS 1 tablet  . cetirizine (ZYRTEC) 10 MG tablet Take 10 mg by mouth daily as needed (allergies.).   Marland Kitchen Cholecalciferol (VITAMIN D) 2000 UNITS tablet Take 2,000 Units by mouth daily.  . clopidogrel (PLAVIX) 75 MG tablet Take 1 tablet (75 mg total) by mouth daily.  . clorazepate (TRANXENE) 7.5 MG tablet Take 7.5 mg by mouth daily as needed for anxiety.    . Coenzyme Q10 (COQ10) 50 MG CAPS Take 50 mg by mouth daily at 2 PM.  . levothyroxine (SYNTHROID) 125 MCG tablet Take by mouth.  . magnesium oxide (MAG-OX) 400 MG tablet Take 400 mg by mouth every evening.   . metoprolol tartrate (LOPRESSOR) 25 MG tablet Take 0.5 tablets (12.5 mg total) by mouth 2 (two) times daily.  . nitroGLYCERIN (NITROSTAT) 0.4 MG SL tablet Place 1 tablet (0.4 mg total) under the tongue every 5 (five) minutes as needed for chest pain. (Patient taking differently: Place 0.4 mg under the tongue every 5 (five) minutes x 3 doses as needed for chest pain.)  . Omega-3 Fatty Acids (FISH OIL PO) Take 1 tablet by mouth daily at 2 PM.   . rosuvastatin (CRESTOR) 20 MG tablet Take 20 mg by mouth every evening.   Marland Kitchen spironolactone (ALDACTONE) 25 MG tablet TAKE 1 TABLET BY MOUTH  DAILY (Patient taking differently: Take 25 mg by mouth daily.)   No current facility-administered medications for this visit. (Other)      REVIEW OF SYSTEMS:    ALLERGIES No Known Allergies  PAST MEDICAL HISTORY Past Medical History:  Diagnosis Date  . Allergic rhinitis   . Anxiety   . Bunion    PES PLANUS  . Cervical disc disease   . Hearing loss    HEARING AIDS, BUT DOES NOT  WEAR THEM CONSISTENTLY.   Marland Kitchen Hypercholesteremia   . Hypertension   . Hypothyroid   . Leaky heart valve    DR. HARWANI  . Mild obesity   . Osteoarthritis of knees, bilateral   . Osteoporosis 12/2017   T score -3.3  . Venous insufficiency    Past Surgical History:  Procedure Laterality Date  . ABDOMINAL AORTOGRAM W/LOWER EXTREMITY Left 05/04/2020   Procedure: ABDOMINAL AORTOGRAM W/LOWER EXTREMITY;  Surgeon: Leonie Douglas, MD;  Location: [facility];  Service: Cardiovascular;  Laterality: Left;  . CATARACT EXTRACTION, BILATERAL  2013   DR. SHAPIRO   . HYSTEROSCOPY     D & C  . LEFT HEART CATH AND CORONARY ANGIOGRAPHY N/A 03/10/2018   Procedure: LEFT HEART CATH AND CORONARY ANGIOGRAPHY;  Surgeon: Lyn Records, MD;  Location: [facility];  Service: Cardiovascular;  Laterality: N/A;  . NEPHRECTOMY LIVING DONOR  1978  . PERIPHERAL VASCULAR INTERVENTION Left 05/04/2020   Procedure: PERIPHERAL VASCULAR INTERVENTION;  Surgeon: Leonie Douglas, MD;  Location: [facility];  Service: Cardiovascular;  Laterality: Left;  Femoral popliteal    FAMILY HISTORY Family History  Problem Relation Age of Onset  . Hypertension Mother   . CVA Mother   . Diabetes Sister   . Hypertension Sister   . Heart disease Sister   . Multiple sclerosis Sister   . Hypertension Father   . CVA Father   . Cancer Brother        prostrate  . Hypertension Brother   . CVA Brother   . Seizures Brother   . Pancreatic cancer Sister   . Kidney disease Sister   . Kidney failure Sister   . Heart attack Sister   . Other Brother        PNEUMONIA AS A BABY  . Hypertension Brother   . Breast cancer Neg Hx     SOCIAL HISTORY Social History   Tobacco Use  . Smoking status: Never Smoker  . Smokeless tobacco: Never Used  Vaping Use  . Vaping Use: Never used  Substance Use Topics  . Alcohol use: No    Alcohol/week: 0.0 standard drinks  . Drug use: No         OPHTHALMIC EXAM: Base Eye Exam    Visual Acuity (ETDRS)      Right Left   Dist cc 20/70 +2 20/30 +1   Dist ph cc 20/60 -2 20/25 +2   Correction: Glasses       Tonometry (Tonopen, 8:17 AM)      Right Left   Pressure 14 13       Pupils      Dark Light Shape React APD   Right 8 8 Round Dilated None   Left 4 3 Round Brisk None       Neuro/Psych    Oriented x3: Yes   Mood/Affect: Normal       Dilation    Right eye: 1.0% Mydriacyl, 2.5% Phenylephrine @ 8:22 AM        Slit Lamp and Fundus Exam    External Exam      Right Left   External Normal Normal       Slit Lamp Exam      Right Left   Lids/Lashes Normal Normal   Conjunctiva/Sclera White and quiet White and quiet   Cornea Clear Clear   Anterior Chamber Deep and quiet  Deep and quiet   Iris Round and reactive  Round and reactive   Lens Posterior chamber intraocular lens Posterior chamber intraocular lens   Anterior Vitreous Normal Normal       Fundus Exam      Right Left   Posterior Vitreous Clear, avitric    Disc Normal    C/D Ratio 0.45    Macula Pseudocystoid CME    Vessels Normal    Periphery Normal           IMAGING AND PROCEDURES  Imaging and Procedures for 08/04/20           ASSESSMENT/PLAN:  Vitreomacular traction, right Ofloxacin  4 times daily to the operative eye  Prednisolone acetate 1 drop to the operative eye 4 times daily  Patient instructed not to refill the medications and use them for maximum of 3 weeks.  Patient instructed do not rub the eye.  Patient has the option to use the patch at night.      ICD-10-CM   1. Vitreomacular traction, right  H43.821     1.  OD looks great postop day #1 status post vitrectomy For vitreomacular traction 2.  3.  Ophthalmic Meds Ordered this visit:  No orders of the defined types were placed in this encounter.      Return in about 1 week (around 08/11/2020) for dilate, OCT.  There are no Patient Instructions on file for this visit.   Explained the diagnoses, plan, and follow up with the patient and they expressed understanding.  Patient expressed understanding of the importance of proper follow up care.   Alford Highland Harneet Noblett M.D. Diseases & Surgery of the Retina and Vitreous [facility] 08/04/20     Abbreviations: M myopia (nearsighted); A astigmatism; H hyperopia (farsighted); P presbyopia; Mrx spectacle prescription;  CTL contact lenses; OD right eye; OS left eye; OU both eyes  XT exotropia; ET esotropia; PEK punctate epithelial keratitis; PEE punctate epithelial erosions; DES dry eye syndrome; MGD meibomian gland dysfunction; ATs artificial tears; PFAT's preservative free artificial tears; NSC nuclear sclerotic cataract; PSC posterior subcapsular  cataract; ERM epi-retinal membrane; PVD posterior vitreous detachment; RD retinal detachment; DM diabetes mellitus; DR diabetic retinopathy; NPDR non-proliferative diabetic retinopathy; PDR proliferative diabetic retinopathy; CSME clinically significant macular edema; DME diabetic macular edema; dbh dot blot hemorrhages; CWS cotton wool spot; POAG primary open angle glaucoma; C/D cup-to-disc ratio; HVF humphrey visual field; GVF goldmann visual field; OCT optical coherence tomography; IOP intraocular pressure; BRVO Branch retinal vein occlusion; CRVO central retinal vein occlusion; CRAO central retinal artery occlusion; BRAO branch retinal artery occlusion; RT retinal tear; SB scleral buckle; PPV pars plana vitrectomy; VH Vitreous hemorrhage; PRP panretinal laser photocoagulation; IVK intravitreal kenalog; VMT vitreomacular traction; MH Macular hole;  NVD neovascularization of the disc; NVE neovascularization elsewhere; AREDS age related eye disease study; ARMD age related macular degeneration; POAG primary open angle glaucoma; EBMD epithelial/anterior basement membrane dystrophy; ACIOL anterior chamber intraocular lens; IOL intraocular lens; PCIOL posterior chamber intraocular lens; Phaco/IOL phacoemulsification with intraocular lens placement; PRK photorefractive keratectomy; LASIK laser assisted in situ keratomileusis; HTN hypertension; DM diabetes mellitus; COPD chronic obstructive pulmonary disease

## 2020-08-04 NOTE — Patient Instructions (Signed)
Patient asked to resume typical preop medications  Ofloxacin 1 drop right eye 4 times daily   and Pred forte 1 drop right eye 4 times daily

## 2020-08-09 ENCOUNTER — Other Ambulatory Visit: Payer: Self-pay

## 2020-08-09 ENCOUNTER — Emergency Department (HOSPITAL_COMMUNITY): Payer: Medicare Other

## 2020-08-09 ENCOUNTER — Telehealth: Payer: Self-pay | Admitting: Interventional Cardiology

## 2020-08-09 ENCOUNTER — Inpatient Hospital Stay (HOSPITAL_COMMUNITY)
Admission: EM | Admit: 2020-08-09 | Discharge: 2020-08-12 | DRG: 175 | Disposition: A | Discharge status: Home Health | Payer: Medicare Other | Attending: Internal Medicine | Admitting: Internal Medicine

## 2020-08-09 ENCOUNTER — Encounter (HOSPITAL_COMMUNITY): Payer: Self-pay | Admitting: Emergency Medicine

## 2020-08-09 DIAGNOSIS — I2609 Other pulmonary embolism with acute cor pulmonale: Secondary | ICD-10-CM | POA: Diagnosis not present

## 2020-08-09 DIAGNOSIS — M810 Age-related osteoporosis without current pathological fracture: Secondary | ICD-10-CM | POA: Diagnosis not present

## 2020-08-09 DIAGNOSIS — M21619 Bunion of unspecified foot: Secondary | ICD-10-CM | POA: Diagnosis present

## 2020-08-09 DIAGNOSIS — J9811 Atelectasis: Secondary | ICD-10-CM | POA: Diagnosis not present

## 2020-08-09 DIAGNOSIS — Z79899 Other long term (current) drug therapy: Secondary | ICD-10-CM | POA: Diagnosis not present

## 2020-08-09 DIAGNOSIS — R0602 Shortness of breath: Secondary | ICD-10-CM | POA: Diagnosis not present

## 2020-08-09 DIAGNOSIS — M2140 Flat foot [pes planus] (acquired), unspecified foot: Secondary | ICD-10-CM | POA: Diagnosis not present

## 2020-08-09 DIAGNOSIS — R0781 Pleurodynia: Secondary | ICD-10-CM | POA: Diagnosis not present

## 2020-08-09 DIAGNOSIS — I10 Essential (primary) hypertension: Secondary | ICD-10-CM | POA: Diagnosis present

## 2020-08-09 DIAGNOSIS — Z20822 Contact with and (suspected) exposure to covid-19: Secondary | ICD-10-CM | POA: Diagnosis not present

## 2020-08-09 DIAGNOSIS — E669 Obesity, unspecified: Secondary | ICD-10-CM | POA: Diagnosis present

## 2020-08-09 DIAGNOSIS — Z7902 Long term (current) use of antithrombotics/antiplatelets: Secondary | ICD-10-CM

## 2020-08-09 DIAGNOSIS — I2699 Other pulmonary embolism without acute cor pulmonale: Secondary | ICD-10-CM | POA: Diagnosis not present

## 2020-08-09 DIAGNOSIS — I272 Pulmonary hypertension, unspecified: Secondary | ICD-10-CM | POA: Diagnosis present

## 2020-08-09 DIAGNOSIS — I739 Peripheral vascular disease, unspecified: Secondary | ICD-10-CM | POA: Diagnosis present

## 2020-08-09 DIAGNOSIS — F419 Anxiety disorder, unspecified: Secondary | ICD-10-CM | POA: Diagnosis present

## 2020-08-09 DIAGNOSIS — M170 Bilateral primary osteoarthritis of knee: Secondary | ICD-10-CM | POA: Diagnosis not present

## 2020-08-09 DIAGNOSIS — E8770 Fluid overload, unspecified: Secondary | ICD-10-CM | POA: Diagnosis present

## 2020-08-09 DIAGNOSIS — Z79890 Hormone replacement therapy (postmenopausal): Secondary | ICD-10-CM

## 2020-08-09 DIAGNOSIS — Z7982 Long term (current) use of aspirin: Secondary | ICD-10-CM

## 2020-08-09 DIAGNOSIS — Z8249 Family history of ischemic heart disease and other diseases of the circulatory system: Secondary | ICD-10-CM | POA: Diagnosis not present

## 2020-08-09 DIAGNOSIS — E785 Hyperlipidemia, unspecified: Secondary | ICD-10-CM | POA: Diagnosis present

## 2020-08-09 DIAGNOSIS — Z6835 Body mass index (BMI) 35.0-35.9, adult: Secondary | ICD-10-CM

## 2020-08-09 DIAGNOSIS — E039 Hypothyroidism, unspecified: Secondary | ICD-10-CM | POA: Diagnosis present

## 2020-08-09 DIAGNOSIS — M7989 Other specified soft tissue disorders: Secondary | ICD-10-CM | POA: Diagnosis not present

## 2020-08-09 DIAGNOSIS — R079 Chest pain, unspecified: Secondary | ICD-10-CM | POA: Diagnosis not present

## 2020-08-09 DIAGNOSIS — H9190 Unspecified hearing loss, unspecified ear: Secondary | ICD-10-CM | POA: Diagnosis not present

## 2020-08-09 LAB — BASIC METABOLIC PANEL
Anion gap: 7 (ref 5–15)
BUN: 22 mg/dL (ref 8–23)
CO2: 22 mmol/L (ref 22–32)
Calcium: 9.1 mg/dL (ref 8.9–10.3)
Chloride: 109 mmol/L (ref 98–111)
Creatinine, Ser: 1.02 mg/dL — ABNORMAL HIGH (ref 0.44–1.00)
GFR, Estimated: 54 mL/min — ABNORMAL LOW (ref >60)
Glucose, Bld: 118 mg/dL — ABNORMAL HIGH (ref 70–99)
Potassium: 4.1 mmol/L (ref 3.5–5.1)
Sodium: 138 mmol/L (ref 135–145)

## 2020-08-09 LAB — TROPONIN I (HIGH SENSITIVITY)
Troponin I (High Sensitivity): 22 ng/L — ABNORMAL HIGH (ref <18)
Troponin I (High Sensitivity): 22 ng/L — ABNORMAL HIGH (ref <18)

## 2020-08-09 LAB — CBC
HCT: 42.3 % (ref 36.0–46.0)
Hemoglobin: 13.4 g/dL (ref 12.0–15.0)
MCH: 30.6 pg (ref 26.0–34.0)
MCHC: 31.7 g/dL (ref 30.0–36.0)
MCV: 96.6 fL (ref 80.0–100.0)
Platelets: 173 10*3/uL (ref 150–400)
RBC: 4.38 MIL/uL (ref 3.87–5.11)
RDW: 14.6 % (ref 11.5–15.5)
WBC: 5.4 10*3/uL (ref 4.0–10.5)
nRBC: 0 % (ref 0.0–0.2)

## 2020-08-09 LAB — BRAIN NATRIURETIC PEPTIDE: B Natriuretic Peptide: 85.9 pg/mL (ref 0.0–100.0)

## 2020-08-09 LAB — SARS CORONAVIRUS 2 (TAT 6-24 HRS): SARS Coronavirus 2: NEGATIVE

## 2020-08-09 MED ORDER — LORATADINE 10 MG PO TABS
10.0000 mg | ORAL_TABLET | Freq: Every day | ORAL | Status: DC
  Administered 2020-08-10 – 2020-08-12 (×3): 10 mg via ORAL
  Filled 2020-08-09 (×4): qty 1

## 2020-08-09 MED ORDER — OFLOXACIN 0.3 % OP SOLN
1.0000 [drp] | Freq: Four times a day (QID) | OPHTHALMIC | Status: DC
  Administered 2020-08-10 – 2020-08-12 (×9): 1 [drp] via OPHTHALMIC
  Filled 2020-08-09: qty 5

## 2020-08-09 MED ORDER — OXYCODONE HCL 5 MG PO TABS
5.0000 mg | ORAL_TABLET | ORAL | Status: DC | PRN
  Administered 2020-08-09 – 2020-08-10 (×2): 5 mg via ORAL
  Filled 2020-08-09 (×2): qty 1

## 2020-08-09 MED ORDER — CLOPIDOGREL BISULFATE 75 MG PO TABS: 75.0000 mg | ORAL_TABLET | Freq: Every day | ORAL | Status: DC

## 2020-08-09 MED ORDER — AMLODIPINE BESYLATE 2.5 MG PO TABS
2.5000 mg | ORAL_TABLET | Freq: Every day | ORAL | Status: DC
  Administered 2020-08-10: 2.5 mg via ORAL
  Filled 2020-08-09 (×2): qty 1

## 2020-08-09 MED ORDER — IOHEXOL 350 MG/ML SOLN
100.0000 mL | Freq: Once | INTRAVENOUS | Status: AC | PRN
  Administered 2020-08-09: 74 mL via INTRAVENOUS

## 2020-08-09 MED ORDER — ACETAMINOPHEN 500 MG PO TABS: 500.0000 mg | ORAL_TABLET | ORAL | Status: DC | PRN

## 2020-08-09 MED ORDER — HEPARIN (PORCINE) 25000 UT/250ML-% IV SOLN
1000.0000 [IU]/h | INTRAVENOUS | Status: DC
  Administered 2020-08-09: 1450 [IU]/h via INTRAVENOUS
  Administered 2020-08-10: 1150 [IU]/h via INTRAVENOUS
  Administered 2020-08-11: 1000 [IU]/h via INTRAVENOUS
  Filled 2020-08-09 (×4): qty 250

## 2020-08-09 MED ORDER — ASPIRIN EC 81 MG PO TBEC
81.0000 mg | DELAYED_RELEASE_TABLET | Freq: Every day | ORAL | Status: DC
  Administered 2020-08-10 – 2020-08-12 (×3): 81 mg via ORAL
  Filled 2020-08-09 (×4): qty 1

## 2020-08-09 MED ORDER — LEVOTHYROXINE SODIUM 112 MCG PO TABS
112.0000 ug | ORAL_TABLET | Freq: Every day | ORAL | Status: DC
  Administered 2020-08-10 – 2020-08-12 (×3): 112 ug via ORAL
  Filled 2020-08-09 (×3): qty 1

## 2020-08-09 MED ORDER — PANTOPRAZOLE SODIUM 40 MG PO TBEC
40.0000 mg | DELAYED_RELEASE_TABLET | Freq: Every day | ORAL | Status: DC
  Administered 2020-08-09 – 2020-08-12 (×4): 40 mg via ORAL
  Filled 2020-08-09 (×3): qty 1

## 2020-08-09 MED ORDER — PREDNISOLONE ACETATE 1 % OP SUSP
1.0000 [drp] | Freq: Four times a day (QID) | OPHTHALMIC | Status: DC
  Administered 2020-08-10 – 2020-08-12 (×9): 1 [drp] via OPHTHALMIC
  Filled 2020-08-09: qty 5

## 2020-08-09 MED ORDER — CLORAZEPATE DIPOTASSIUM 3.75 MG PO TABS
7.5000 mg | ORAL_TABLET | Freq: Every day | ORAL | Status: DC | PRN
Start: 2020-08-09 — End: 2020-08-12

## 2020-08-09 MED ORDER — SPIRONOLACTONE 25 MG PO TABS
25.0000 mg | ORAL_TABLET | Freq: Every day | ORAL | Status: DC
  Administered 2020-08-09 – 2020-08-12 (×4): 25 mg via ORAL
  Filled 2020-08-09 (×5): qty 1

## 2020-08-09 MED ORDER — DOCUSATE SODIUM 100 MG PO CAPS
100.0000 mg | ORAL_CAPSULE | Freq: Two times a day (BID) | ORAL | Status: DC
  Administered 2020-08-09 – 2020-08-12 (×6): 100 mg via ORAL
  Filled 2020-08-09 (×6): qty 1

## 2020-08-09 MED ORDER — MAGNESIUM OXIDE 400 (241.3 MG) MG PO TABS
400.0000 mg | ORAL_TABLET | Freq: Every evening | ORAL | Status: DC
  Administered 2020-08-09 – 2020-08-11 (×3): 400 mg via ORAL
  Filled 2020-08-09 (×5): qty 1

## 2020-08-09 MED ORDER — ROSUVASTATIN CALCIUM 20 MG PO TABS
20.0000 mg | ORAL_TABLET | Freq: Every evening | ORAL | Status: DC
  Administered 2020-08-09 – 2020-08-11 (×3): 20 mg via ORAL
  Filled 2020-08-09 (×3): qty 1

## 2020-08-09 MED ORDER — METOPROLOL TARTRATE 12.5 MG HALF TABLET
12.5000 mg | ORAL_TABLET | Freq: Two times a day (BID) | ORAL | Status: DC
  Administered 2020-08-09 – 2020-08-12 (×6): 12.5 mg via ORAL
  Filled 2020-08-09 (×7): qty 1

## 2020-08-09 MED ORDER — HEPARIN BOLUS VIA INFUSION
5000.0000 [IU] | Freq: Once | INTRAVENOUS | Status: AC
  Administered 2020-08-09: 5000 [IU] via INTRAVENOUS
  Filled 2020-08-09: qty 5000

## 2020-08-09 MED ORDER — LIDOCAINE 5 % EX PTCH
1.0000 | MEDICATED_PATCH | CUTANEOUS | Status: DC
  Administered 2020-08-09 – 2020-08-11 (×3): 1 via TRANSDERMAL
  Filled 2020-08-09 (×3): qty 1

## 2020-08-09 NOTE — ED Triage Notes (Signed)
C/o SOB x 2 weeks that is worse today.  Also reports R sided rib pain today.

## 2020-08-09 NOTE — Telephone Encounter (Signed)
Patient daughter called in pt c/o SOB.  She has had SOB for at least 2 weeks.  This AM SOB has increased, noted while sitting.  Patient has noted sharp pain on right side/ shoulder with breathing.  BP 172/94 P 81 this AM.  Advised to take morning BP meds.  I advised her to go to the ED or call 911.  Daughter said she is going to get dressed and go to the ER.  I advised daughter to call 911 if pain or SOB gets any worse.  Daughter agreed to plan.

## 2020-08-09 NOTE — ED Notes (Signed)
This RN attempted to call report, awaiting return call from nurse.

## 2020-08-09 NOTE — ED Provider Notes (Signed)
[facility] EMERGENCY DEPARTMENT Provider Note   CSN: [id number] Arrival date & time: 08/09/20  1028     History Chief Complaint  Patient presents with  . Shortness of Breath    Lynn Brown is a 87 y.o. female.  HPI  Pt brought in by her daughter to the ED for shortness of breath. Pt reports worsening of shortness of breath over the past 2 weeks. Endorse new right sided rib pain. States she spoke with Dr. Michaelle Copas cardiologist office who advised her to go to the ED. Denies fever, cough, congestion. Endorses rhinorrhea but reports she has allergies. No diaphoresis. LE swelling is chronic and is at her baseline.       Past Medical History:  Diagnosis Date  . Allergic rhinitis   . Anxiety   . Bunion    PES PLANUS  . Cervical disc disease   . Hearing loss    HEARING AIDS, BUT DOES NOT WEAR THEM CONSISTENTLY.   Marland Kitchen Hypercholesteremia   . Hypertension   . Hypothyroid   . Leaky heart valve    DR. HARWANI  . Mild obesity   . Osteoarthritis of knees, bilateral   . Osteoporosis 12/2017   T score -3.3  . Venous insufficiency     Patient Active Problem List   Diagnosis Date Noted  . 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  . Vitreomacular traction, right 04/14/2020  . Macular retinoschisis, right 04/14/2020  . 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 NSTEMI (non-ST elevated myocardial infarction) (HCC) 03/09/2018  . Chronic diastolic heart failure (HCC) 09/25/2017  . Hypothyroid   . Essential hypertension   . Hypercholesteremia   . Osteopenia 04/27/2008    Past Surgical History:  Procedure Laterality Date  . ABDOMINAL AORTOGRAM W/LOWER EXTREMITY Left 05/04/2020   Procedure: ABDOMINAL AORTOGRAM W/LOWER EXTREMITY;  Surgeon: Leonie Douglas, MD;  Location: [facility] INVASIVE CV LAB;  Service: Cardiovascular;  Laterality: Left;  . CATARACT EXTRACTION, BILATERAL  2013   DR. SHAPIRO   . HYSTEROSCOPY     D & C  . LEFT HEART CATH AND CORONARY ANGIOGRAPHY N/A  03/10/2018   Procedure: LEFT HEART CATH AND CORONARY ANGIOGRAPHY;  Surgeon: Lyn Records, MD;  Location: [facility] INVASIVE CV LAB;  Service: Cardiovascular;  Laterality: N/A;  . NEPHRECTOMY LIVING DONOR  1978  . PERIPHERAL VASCULAR INTERVENTION Left 05/04/2020   Procedure: PERIPHERAL VASCULAR INTERVENTION;  Surgeon: Leonie Douglas, MD;  Location: [facility] INVASIVE CV LAB;  Service: Cardiovascular;  Laterality: Left;  Femoral popliteal     OB History    Gravida  5   Para  4   Term      Preterm      AB  1   Living  4     SAB  1   IAB      Ectopic      Multiple      Live Births              Family History  Problem Relation Age of Onset  . Hypertension Mother   . CVA Mother   . Diabetes Sister   . Hypertension Sister   . Heart disease Sister   . Multiple sclerosis Sister   . Hypertension Father   . CVA Father   . Cancer Brother        prostrate  . Hypertension Brother   . CVA Brother   . Seizures Brother   . Pancreatic cancer Sister   . Kidney disease  Sister   . Kidney failure Sister   . Heart attack Sister   . Other Brother        PNEUMONIA AS A BABY  . Hypertension Brother   . Breast cancer Neg Hx     Social History   Tobacco Use  . Smoking status: Never Smoker  . Smokeless tobacco: Never Used  Vaping Use  . Vaping Use: Never used  Substance Use Topics  . Alcohol use: No    Alcohol/week: 0.0 standard drinks  . Drug use: No    Home Medications Prior to Admission medications   Medication Sig Start Date End Date Taking? Authorizing Provider  acetaminophen (TYLENOL) 500 MG tablet Take 500-1,000 mg by mouth as needed (for pain).   Yes [provider]  amLODipine (NORVASC) 2.5 MG tablet TAKE 1 TABLET BY MOUTH  DAILY Patient taking differently: Take 2.5 mg by mouth daily. 02/11/20  Yes Lyn Records, MD  ascorbic acid (VITAMIN C) 500 MG tablet Take 500 mg by mouth in the morning and at bedtime.   Yes [provider]  aspirin EC 81 MG  tablet Take 1 tablet (81 mg total) by mouth daily. Swallow whole. 02/05/20  Yes Lyn Records, MD  Calcium Citrate-Vitamin D 315-250 MG-UNIT TABS Take 1 tablet by mouth daily.   Yes [provider]  cetirizine (ZYRTEC) 10 MG tablet Take 10 mg by mouth daily as needed (allergies.).  09/07/19  Yes [provider]  Cholecalciferol (VITAMIN D) 2000 UNITS tablet Take 2,000 Units by mouth daily.   Yes [provider]  clopidogrel (PLAVIX) 75 MG tablet Take 1 tablet (75 mg total) by mouth daily. 05/04/20 05/04/21 Yes Leonie Douglas, MD  clorazepate (TRANXENE) 7.5 MG tablet Take 7.5 mg by mouth daily as needed for anxiety.    Yes [provider]  Coenzyme Q10 (COQ10) 50 MG CAPS Take 50 mg by mouth daily at 2 PM.   Yes [provider]  levothyroxine (SYNTHROID) 112 MCG tablet Take 112 mcg by mouth every morning. 06/30/20  Yes [provider]  magnesium oxide (MAG-OX) 400 MG tablet Take 400 mg by mouth every evening.    Yes [provider]  metoprolol tartrate (LOPRESSOR) 25 MG tablet Take 0.5 tablets (12.5 mg total) by mouth 2 (two) times daily. 03/12/18  Yes Albertine Grates, MD  nitroGLYCERIN (NITROSTAT) 0.4 MG SL tablet Place 1 tablet (0.4 mg total) under the tongue every 5 (five) minutes as needed for chest pain. Patient taking differently: Place 0.4 mg under the tongue every 5 (five) minutes x 3 doses as needed for chest pain. 03/12/18  Yes Albertine Grates, MD  ofloxacin (OCUFLOX) 0.3 % ophthalmic solution Place 1 drop into the right eye 4 (four) times daily for 21 days. 07/27/20 08/17/20 Yes Rankin, Alford Highland, MD  prednisoLONE acetate (PRED FORTE) 1 % ophthalmic suspension Place 1 drop into the right eye 4 (four) times daily for 21 days. 07/27/20 08/17/20 Yes Rankin, Alford Highland, MD  rosuvastatin (CRESTOR) 20 MG tablet Take 20 mg by mouth every evening.    Yes [provider]  spironolactone (ALDACTONE) 25 MG tablet TAKE 1 TABLET BY MOUTH  DAILY Patient taking  differently: Take 25 mg by mouth daily. 03/29/20  Yes Lyn Records, MD    Allergies    Patient has no known allergies.  Review of Systems   Review of Systems  Constitutional: Negative for diaphoresis and fever.  HENT: Positive for rhinorrhea. Negative for congestion  and sore throat.   Eyes: Negative for visual disturbance.  Respiratory: Positive for shortness of breath. Negative for cough.   Cardiovascular: Positive for leg swelling. Negative for chest pain.  Gastrointestinal: Negative for abdominal pain, diarrhea, nausea and vomiting.  Genitourinary: Negative for dysuria.  Musculoskeletal: Negative for back pain.       Right rib pain  Skin:       Lower extremity stasis changes (chronic)  Neurological: Negative for syncope and headaches.  All other systems reviewed and are negative.   Physical Exam Updated Vital Signs BP 121/76 (BP Location: Right Arm)   Pulse 75   Temp 98.1 F (36.7 C)   Resp 16   SpO2 92%   Physical Exam Vitals and nursing note reviewed.  Constitutional:      Appearance: She is well-developed.  HENT:     Head: Normocephalic and atraumatic.     Mouth/Throat:     Mouth: Mucous membranes are moist.     Pharynx: Oropharynx is clear.  Eyes:     Extraocular Movements: Extraocular movements intact.     Comments: Right scleral injection s/p recent eye surgery, wearing glasses  Neck:     Vascular: No JVD.  Cardiovascular:     Rate and Rhythm: Normal rate and regular rhythm.     Heart sounds: Normal heart sounds.  Pulmonary:     Effort: Pulmonary effort is normal. No tachypnea or respiratory distress.     Breath sounds: Normal breath sounds.  Abdominal:     General: Bowel sounds are normal.     Palpations: Abdomen is soft.     Tenderness: There is no abdominal tenderness.  Musculoskeletal:     Cervical back: Normal range of motion and neck supple.     Right lower leg: Tenderness present. Edema present.     Left lower leg: Tenderness present. Edema  present.     Comments: 2+ Pitting, chronic venous stasis dermatitis   Lymphadenopathy:     Cervical: No cervical adenopathy.  Skin:    General: Skin is warm.     Capillary Refill: Capillary refill takes less than 2 seconds.  Neurological:     Mental Status: She is alert and oriented to person, place, and time. Mental status is at baseline.  Psychiatric:        Mood and Affect: Mood normal.        Behavior: Behavior normal.     ED Results / Procedures / Treatments   Labs (all labs ordered are listed, but only abnormal results are displayed) Labs Reviewed  BASIC METABOLIC PANEL - Abnormal; Notable for the following components:      Result Value   Glucose, Bld 118 (*)    Creatinine, Ser 1.02 (*)    GFR, Estimated 54 (*)    All other components within normal limits  TROPONIN I (HIGH SENSITIVITY) - Abnormal; Notable for the following components:   Troponin I (High Sensitivity) 22 (*)    All other components within normal limits  TROPONIN I (HIGH SENSITIVITY) - Abnormal; Notable for the following components:   Troponin I (High Sensitivity) 22 (*)    All other components within normal limits  SARS CORONAVIRUS 2 (TAT 6-24 HRS)  CBC  BRAIN NATRIURETIC PEPTIDE    EKG EKG Interpretation  Date/Time:  Tuesday August 09 2020 10:31:01 EDT Ventricular Rate:  77 PR Interval:  188 QRS Duration: 78 QT Interval:  394 QTC Calculation: 445 R Axis:   82 Text Interpretation: Normal sinus rhythm  Cannot rule out Anterior infarct , age undetermined Abnormal ECG Confirmed by Blane Ohara [id number]) on 08/09/2020 11:35:28 AM   Radiology DG Chest 2 View  Result Date: 08/09/2020 CLINICAL DATA:  Shortness of breath for 2 weeks. Right-sided rib pain. EXAM: CHEST - 2 VIEW COMPARISON:  03/09/2018. FINDINGS: Lungs are suboptimally inflated. Head aortic atherosclerosis. Normal heart size. Asymmetric hazy opacities within the left midlung and left lower lobe are noted. The right hilum appears  asymmetrically enlarged and increased in size compared with previous imaging. Multi level degenerative disc disease identified within the thoracic spine. IMPRESSION: 1. Asymmetric hazy opacities within the left midlung and left lower lobe concerning for pneumonia. 2. Asymmetric enlargement of the right hilum. This is nonspecific and may reflect AP technique and hypoinflation. Recommend further evaluation with nonemergent contrast-enhanced CT of the chest. Electronically Signed   By: Signa Kell M.D.   On: 08/09/2020 11:18   DG Ribs Unilateral Right  Result Date: 08/09/2020 CLINICAL DATA:  Right rib pain and shortness of breath for 2 weeks. EXAM: RIGHT RIBS - 2 VIEW COMPARISON:  Chest radiograph of earlier today. FINDINGS: Two views of right-sided ribs demonstrate no displaced fracture. No pleural fluid or pneumothorax. Osteopenia. S shaped thoracic spine curvature. IMPRESSION: No acute osseous abnormality. Electronically Signed   By: Jeronimo Greaves M.D.   On: 08/09/2020 12:20   CT Angio Chest PE W and/or Wo Contrast  Result Date: 08/09/2020 CLINICAL DATA:  Chest pain, shortness of breath EXAM: CT ANGIOGRAPHY CHEST WITH CONTRAST TECHNIQUE: Multidetector CT imaging of the chest was performed using the standard protocol during bolus administration of intravenous contrast. Multiplanar CT image reconstructions and MIPs were obtained to evaluate the vascular anatomy. CONTRAST:  81mL OMNIPAQUE IOHEXOL 350 MG/ML SOLN COMPARISON:  Chest x-ray today FINDINGS: Cardiovascular: Extensive pulmonary embolus noted in the right pulmonary arteries including right main pulmonary artery with occlusion of the right lower lobe pulmonary artery. Pulmonary emboli also noted in the right upper lobe. No pulmonary emboli on the left. Evidence of right heart strain with an RV/LV ratio of 1.22. Reflux of contrast into the IVC and hepatic veins suggests right heart dysfunction. Coronary artery and aortic atherosclerosis. No aortic  aneurysm. Mediastinum/Nodes: No mediastinal, hilar, or axillary adenopathy. Trachea and esophagus are unremarkable. Thyroid unremarkable. Lungs/Pleura: Atelectasis noted medially and posteriorly in the left lung base. No effusions or confluent opacities otherwise. Upper Abdomen: Scattered hypodensities throughout the liver cannot be characterized on this study, favor cysts. Musculoskeletal: Chest wall soft tissues are unremarkable. No acute bony abnormality. Review of the MIP images confirms the above findings. IMPRESSION: Extensive pulmonary emboli within the right lung. CT evidence of right heart strain (RV/LV Ratio = 1.22) consistent with at least submassive (intermediate risk) PE. The presence of right heart strain has been associated with an increased risk of morbidity and mortality. Coronary artery disease. Atelectasis in the left lower lobe. Aortic Atherosclerosis (ICD10-I70.0). These results were called by telephone at the time of interpretation on 08/09/2020 at 1:56 pm to provider [facility] , who verbally acknowledged these results. Electronically Signed   By: Charlett Nose M.D.   On: 08/09/2020 13:57    Procedures Procedures   Medications Ordered in ED Medications  iohexol (OMNIPAQUE) 350 MG/ML injection 100 mL (74 mLs Intravenous Contrast Given 08/09/20 1337)    ED Course  I have reviewed the triage vital signs and the nursing notes.  Pertinent labs & imaging results that were available during my care of the patient were reviewed  by me and considered in my medical decision making (see chart for details).  2:01 PM Discussed PE with pt and daughter. Pt satting 95% on RA.   3:46 PM  Spoke with [facility] hospitalist who will admit the patient.      MDM Rules/Calculators/A&P                          Pt is a 87 yo female with hx of NSTEMI 2/2 to LAD aneurysm, diastolic heart failure, LCX and RCA ectasia, severe PAD s/p femoral and popliteal stenting, HLD, HTN, hypothyroidism, s/p  nephrectomy who presented to the ED for worsening dyspnea for past 2 weeks.   Pt is not anemic. No vascular congestion or cardiomegaly on CXR. BNP 85.9. Not likely acute heart failure. No pneumothorax. History not consistent with aortic dissection. EKG without acute ST or T wave changes. Troponin flat (22,22).  COVID pending.   CTA Chest concerning for submassive PE in the right lung with evidence of right heart strain.  VSS. Patient stable on room air. Start Heparin gtt. Will admit to medicine service for further evaluation.    Final Clinical Impression(s) / ED Diagnoses Final diagnoses:  SOB (shortness of breath)  Acute pulmonary embolism with acute cor pulmonale, unspecified pulmonary embolism type [facility](HCC)    Rx / DC Orders ED Discharge Orders    None       Katha CabalBrimage, Vondra, DO 08/09/20 1546    Blane OharaZavitz, Joshua, MD 08/09/20 1620

## 2020-08-09 NOTE — H&P (Addendum)
History and Physical    Lynn Brown:[id number] DOB: 08/13/1931 DOA: 08/09/2020  PCP: Laurann Montana, MD (Confirm with patient/family/NH records and if not entered, this has to be entered at [facility] point of entry) Patient coming from: Home  I have personally briefly reviewed patient's old medical records in [facility]  Chief Complaint: Chest pain  HPI: Lynn Brown is a 87 y.o. 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  Patient started to have increasing shortness of breath associated with right-sided sharp-like chest pain about 1 month.  Gradually getting worse.  Chest pain sharp-like 6-7 over 10, exacerbated with deep breath and movement, radiating to the right shoulder.  Denies any fever chills, no cough.  She is a non-smoker and no recent travel, no recent Covid infection and she is CoVID vaccinated x2. One of her niece has DVT and on Eliquis. ED Course: Submassive PE on right side of the lung.  Troponin 22> 22.  Review of Systems: As per HPI otherwise 14 point review of systems negative.    Past Medical History:  Diagnosis Date  . Allergic rhinitis   . Anxiety   . Bunion    PES PLANUS  . Cervical disc disease   . Hearing loss    HEARING AIDS, BUT DOES NOT WEAR THEM CONSISTENTLY.   Marland Kitchen Hypercholesteremia   . Hypertension   . Hypothyroid   . Leaky heart valve    DR. HARWANI  . Mild obesity   . Osteoarthritis of knees, bilateral   . Osteoporosis 12/2017   T score -3.3  . Venous insufficiency     Past Surgical History:  Procedure Laterality Date  . ABDOMINAL AORTOGRAM W/LOWER EXTREMITY Left 05/04/2020   Procedure: ABDOMINAL AORTOGRAM W/LOWER EXTREMITY;  Surgeon: Leonie Douglas, MD;  Location: [facility];  Service: Cardiovascular;  Laterality: Left;  .  CATARACT EXTRACTION, BILATERAL  2013   DR. SHAPIRO   . HYSTEROSCOPY     D & C  . LEFT HEART CATH AND CORONARY ANGIOGRAPHY N/A 03/10/2018   Procedure: LEFT HEART CATH AND CORONARY ANGIOGRAPHY;  Surgeon: Lyn Records, MD;  Location: [facility] INVASIVE CV LAB;  Service: Cardiovascular;  Laterality: N/A;  . NEPHRECTOMY LIVING DONOR  1978  . PERIPHERAL VASCULAR INTERVENTION Left 05/04/2020   Procedure: PERIPHERAL VASCULAR INTERVENTION;  Surgeon: Leonie Douglas, MD;  Location: [facility] INVASIVE CV LAB;  Service: Cardiovascular;  Laterality: Left;  Femoral popliteal     reports that she has never smoked. She has never used smokeless tobacco. She reports that she does not drink alcohol and does not use drugs.  No Known Allergies  Family History  Problem Relation Age of Onset  . Hypertension Mother   . CVA Mother   . Diabetes Sister   . Hypertension Sister   . Heart disease Sister   . Multiple sclerosis Sister   . Hypertension Father   . CVA Father   . Cancer Brother        prostrate  . Hypertension Brother   . CVA Brother   . Seizures Brother   . Pancreatic cancer Sister   . Kidney disease Sister   . Kidney failure Sister   . Heart attack Sister   . Other Brother        PNEUMONIA AS A BABY  .  Hypertension Brother   . Breast cancer Neg Hx      Prior to Admission medications   Medication Sig Start Date End Date Taking? Authorizing Provider  acetaminophen (TYLENOL) 500 MG tablet Take 500-1,000 mg by mouth as needed (for pain).   Yes [provider]  amLODipine (NORVASC) 2.5 MG tablet TAKE 1 TABLET BY MOUTH  DAILY Patient taking differently: Take 2.5 mg by mouth daily. 02/11/20  Yes Lyn RecordsSmith, Henry W, MD  ascorbic acid (VITAMIN C) 500 MG tablet Take 500 mg by mouth in the morning and at bedtime.   Yes [provider]  aspirin EC 81 MG tablet Take 1 tablet (81 mg total) by mouth daily. Swallow whole. 02/05/20  Yes Lyn RecordsSmith, Henry W, MD  Calcium Citrate-Vitamin D 315-250 MG-UNIT  TABS Take 1 tablet by mouth daily.   Yes [provider]  cetirizine (ZYRTEC) 10 MG tablet Take 10 mg by mouth daily as needed (allergies.).  09/07/19  Yes [provider]  Cholecalciferol (VITAMIN D) 2000 UNITS tablet Take 2,000 Units by mouth daily.   Yes [provider]  clopidogrel (PLAVIX) 75 MG tablet Take 1 tablet (75 mg total) by mouth daily. 05/04/20 05/04/21 Yes Leonie DouglasHawken, Thomas N, MD  clorazepate (TRANXENE) 7.5 MG tablet Take 7.5 mg by mouth daily as needed for anxiety.    Yes [provider]  Coenzyme Q10 (COQ10) 50 MG CAPS Take 50 mg by mouth daily at 2 PM.   Yes [provider]  levothyroxine (SYNTHROID) 112 MCG tablet Take 112 mcg by mouth every morning. 06/30/20  Yes [provider]  magnesium oxide (MAG-OX) 400 MG tablet Take 400 mg by mouth every evening.    Yes [provider]  metoprolol tartrate (LOPRESSOR) 25 MG tablet Take 0.5 tablets (12.5 mg total) by mouth 2 (two) times daily. 03/12/18  Yes Albertine GratesXu, Fang, MD  nitroGLYCERIN (NITROSTAT) 0.4 MG SL tablet Place 1 tablet (0.4 mg total) under the tongue every 5 (five) minutes as needed for chest pain. Patient taking differently: Place 0.4 mg under the tongue every 5 (five) minutes x 3 doses as needed for chest pain. 03/12/18  Yes Albertine GratesXu, Fang, MD  ofloxacin (OCUFLOX) 0.3 % ophthalmic solution Place 1 drop into the right eye 4 (four) times daily for 21 days. 07/27/20 08/17/20 Yes Rankin, Alford HighlandGary A, MD  prednisoLONE acetate (PRED FORTE) 1 % ophthalmic suspension Place 1 drop into the right eye 4 (four) times daily for 21 days. 07/27/20 08/17/20 Yes Rankin, Alford HighlandGary A, MD  rosuvastatin (CRESTOR) 20 MG tablet Take 20 mg by mouth every evening.    Yes [provider]  spironolactone (ALDACTONE) 25 MG tablet TAKE 1 TABLET BY MOUTH  DAILY Patient taking differently: Take 25 mg by mouth daily. 03/29/20  Yes Lyn RecordsSmith, Henry W, MD    Physical Exam: Vitals:   08/09/20 1415 08/09/20 1430 08/09/20  1445 08/09/20 1515  BP: (!) 157/92 127/82 128/79 121/80  Pulse: 72 70 71 70  Resp: 17 19 (!) 21 (!) 23  Temp:      SpO2: 97% 95% 96% 98%    Constitutional: NAD, calm, comfortable Vitals:   08/09/20 1415 08/09/20 1430 08/09/20 1445 08/09/20 1515  BP: (!) 157/92 127/82 128/79 121/80  Pulse: 72 70 71 70  Resp: 17 19 (!) 21 (!) 23  Temp:      SpO2: 97% 95% 96% 98%   Eyes: PERRL, lids and conjunctivae normal ENMT: Mucous membranes are moist. Posterior pharynx clear of any exudate  or lesions.Normal dentition.  Neck: normal, supple, no masses, no thyromegaly Respiratory: Diminished breathing sound on the right lower field, no wheezing, no crackles. Normal respiratory effort. No accessory muscle use.  Cardiovascular: Regular rate and rhythm, no murmurs / rubs / gallops. 2+ extremity edema. 2+ pedal pulses. No carotid bruits.  Abdomen: no tenderness, no masses palpated. No hepatosplenomegaly. Bowel sounds positive.  Musculoskeletal: no clubbing / cyanosis. No joint deformity upper and lower extremities. Good ROM, no contractures. Normal muscle tone.  Skin: no rashes, lesions, ulcers. No induration Neurologic: CN 2-12 grossly intact. Sensation intact, DTR normal. Strength 5/5 in all 4.  Psychiatric: Normal judgment and insight. Alert and oriented x 3. Normal mood.     Labs on Admission: I have personally reviewed following labs and imaging studies  CBC: Recent Labs  Lab 08/09/20 1041  WBC 5.4  HGB 13.4  HCT 42.3  MCV 96.6  PLT 173   Basic Metabolic Panel: Recent Labs  Lab 08/09/20 1041  NA 138  K 4.1  CL 109  CO2 22  GLUCOSE 118*  BUN 22  CREATININE 1.02*  CALCIUM 9.1   GFR: CrCl cannot be calculated (Unknown ideal weight.). Liver Function Tests: No results for input(s): AST, ALT, ALKPHOS, BILITOT, PROT, ALBUMIN in the last 168 hours. No results for input(s): LIPASE, AMYLASE in the last 168 hours. No results for input(s): AMMONIA in the last 168 hours. Coagulation  Profile: No results for input(s): INR, PROTIME in the last 168 hours. Cardiac Enzymes: No results for input(s): CKTOTAL, CKMB, CKMBINDEX, TROPONINI in the last 168 hours. BNP (last 3 results) No results for input(s): PROBNP in the last 8760 hours. HbA1C: No results for input(s): HGBA1C in the last 72 hours. CBG: No results for input(s): GLUCAP in the last 168 hours. Lipid Profile: No results for input(s): CHOL, HDL, LDLCALC, TRIG, CHOLHDL, LDLDIRECT in the last 72 hours. Thyroid Function Tests: No results for input(s): TSH, T4TOTAL, FREET4, T3FREE, THYROIDAB in the last 72 hours. Anemia Panel: No results for input(s): VITAMINB12, FOLATE, FERRITIN, TIBC, IRON, RETICCTPCT in the last 72 hours. Urine analysis:    Component Value Date/Time   COLORURINE STRAW (A) 06/01/2017 2029   APPEARANCEUR CLEAR 06/01/2017 2029   LABSPEC 1.010 06/01/2017 2029   PHURINE 6.0 06/01/2017 2029   GLUCOSEU NEGATIVE 06/01/2017 2029   HGBUR NEGATIVE 06/01/2017 2029   BILIRUBINUR NEGATIVE 06/01/2017 2029   KETONESUR NEGATIVE 06/01/2017 2029   PROTEINUR 30 (A) 06/01/2017 2029   UROBILINOGEN 0.2 01/04/2012 1450   NITRITE NEGATIVE 06/01/2017 2029   LEUKOCYTESUR MODERATE (A) 06/01/2017 2029    Radiological Exams on Admission: DG Chest 2 View  Result Date: 08/09/2020 CLINICAL DATA:  Shortness of breath for 2 weeks. Right-sided rib pain. EXAM: CHEST - 2 VIEW COMPARISON:  03/09/2018. FINDINGS: Lungs are suboptimally inflated. Head aortic atherosclerosis. Normal heart size. Asymmetric hazy opacities within the left midlung and left lower lobe are noted. The right hilum appears asymmetrically enlarged and increased in size compared with previous imaging. Multi level degenerative disc disease identified within the thoracic spine. IMPRESSION: 1. Asymmetric hazy opacities within the left midlung and left lower lobe concerning for pneumonia. 2. Asymmetric enlargement of the right hilum. This is nonspecific and may  reflect AP technique and hypoinflation. Recommend further evaluation with nonemergent contrast-enhanced CT of the chest. Electronically Signed   By: Signa Kell M.D.   On: 08/09/2020 11:18   DG Ribs Unilateral Right  Result Date: 08/09/2020 CLINICAL DATA:  Right rib pain and shortness  of breath for 2 weeks. EXAM: RIGHT RIBS - 2 VIEW COMPARISON:  Chest radiograph of earlier today. FINDINGS: Two views of right-sided ribs demonstrate no displaced fracture. No pleural fluid or pneumothorax. Osteopenia. S shaped thoracic spine curvature. IMPRESSION: No acute osseous abnormality. Electronically Signed   By: Jeronimo Greaves M.D.   On: 08/09/2020 12:20   CT Angio Chest PE W and/or Wo Contrast  Result Date: 08/09/2020 CLINICAL DATA:  Chest pain, shortness of breath EXAM: CT ANGIOGRAPHY CHEST WITH CONTRAST TECHNIQUE: Multidetector CT imaging of the chest was performed using the standard protocol during bolus administration of intravenous contrast. Multiplanar CT image reconstructions and MIPs were obtained to evaluate the vascular anatomy. CONTRAST:  33mL OMNIPAQUE IOHEXOL 350 MG/ML SOLN COMPARISON:  Chest x-ray today FINDINGS: Cardiovascular: Extensive pulmonary embolus noted in the right pulmonary arteries including right main pulmonary artery with occlusion of the right lower lobe pulmonary artery. Pulmonary emboli also noted in the right upper lobe. No pulmonary emboli on the left. Evidence of right heart strain with an RV/LV ratio of 1.22. Reflux of contrast into the IVC and hepatic veins suggests right heart dysfunction. Coronary artery and aortic atherosclerosis. No aortic aneurysm. Mediastinum/Nodes: No mediastinal, hilar, or axillary adenopathy. Trachea and esophagus are unremarkable. Thyroid unremarkable. Lungs/Pleura: Atelectasis noted medially and posteriorly in the left lung base. No effusions or confluent opacities otherwise. Upper Abdomen: Scattered hypodensities throughout the liver cannot be  characterized on this study, favor cysts. Musculoskeletal: Chest wall soft tissues are unremarkable. No acute bony abnormality. Review of the MIP images confirms the above findings. IMPRESSION: Extensive pulmonary emboli within the right lung. CT evidence of right heart strain (RV/LV Ratio = 1.22) consistent with at least submassive (intermediate risk) PE. The presence of right heart strain has been associated with an increased risk of morbidity and mortality. Coronary artery disease. Atelectasis in the left lower lobe. Aortic Atherosclerosis (ICD10-I70.0). These results were called by telephone at the time of interpretation on 08/09/2020 at 1:56 pm to provider [facility] , who verbally acknowledged these results. Electronically Signed   By: Charlett Nose M.D.   On: 08/09/2020 13:57    EKG: Independently reviewed.  Chronic right axis deviation  Assessment/Plan Active Problems:   Pulmonary emboli (HCC)  (please populate well all problems here in Problem List. (For example, if patient is on BP meds at home and you resume or decide to hold them, it is a problem that needs to be her. Same for CAD, COPD, HLD and so on)  Submassive PE -Start heparin drip, regarding outpatient anticoagulation regimen, discussed with on-call vascular surgery Dr. Darolyn Rua over phone (patient has 2 PVD stenting put it in about 3 months ago), agreed with ASA and Eliquis regimen, discontinue Plavix. Add PPI. -CT shows signs of right heart strain, however there is no hypoxia or hypotension, ordered echocardiogram and DVT study. -PT evaluation, check ambulatory pulse ox  Chest pain -From right lower lobe infarction, lidocaine patch and narcotics.Marland Kitchen  HTN -Controlled, continue home regimen.  Hypothyroidism -Continue Synthroid.  PVD -Stable  DVT prophylaxis: Heparin drip  code Status: Full Code Family Communication: None at bedside Disposition Plan: Expect 1 to 2 days hospital stay to treat PE with parenteral  anticoagulation until patient's condition more stabilized.  Repeat echo in 4 to 6 weeks. Consults called: None Admission status: PCU   Emeline General MD [facility] Pager [phone]  08/09/2020, 4:22 PM

## 2020-08-09 NOTE — Telephone Encounter (Signed)
Pt c/o Shortness Of Breath: STAT if SOB developed within the last 24 hours or pt is noticeably SOB on the phone  1. Are you currently SOB (can you hear that pt is SOB on the phone)? Yes  2. How long have you been experiencing SOB? At least 2-3 weeks  3. Are you SOB when sitting or when up moving around? When moving around  4. Are you currently experiencing any other symptoms? Pain on her R side that just started today   Per Daughter the breathing is different, like she is trying harder to breathe

## 2020-08-09 NOTE — ED Notes (Signed)
Pt transported to CT ?

## 2020-08-09 NOTE — Progress Notes (Signed)
ANTICOAGULATION CONSULT NOTE - Initial Consult  Pharmacy Consult for heparin Indication: pulmonary embolus  No Known Allergies  Patient Measurements:   Heparin Dosing Weight: 86 kg   Vital Signs: Temp: 98.1 F (36.7 C) (03/15 1035) BP: 121/80 (03/15 1515) Pulse Rate: 70 (03/15 1515)  Labs: Recent Labs    08/09/20 1041 08/09/20 1253  HGB 13.4  --   HCT 42.3  --   PLT 173  --   CREATININE 1.02*  --   TROPONINIHS 22* 22*    CrCl cannot be calculated (Unknown ideal weight.).   Medical History: Past Medical History:  Diagnosis Date  . Allergic rhinitis   . Anxiety   . Bunion    PES PLANUS  . Cervical disc disease   . Hearing loss    HEARING AIDS, BUT DOES NOT WEAR THEM CONSISTENTLY.   Marland Kitchen Hypercholesteremia   . Hypertension   . Hypothyroid   . Leaky heart valve    DR. HARWANI  . Mild obesity   . Osteoarthritis of knees, bilateral   . Osteoporosis 12/2017   T score -3.3  . Venous insufficiency     Medications:  (Not in a hospital admission)   Assessment: 53 YOF who presented with SOB found to have R sided submassive PE with R heart strain. Pharmacy consulted to start IV heparin.   H/H and Plt wnl. SCr 1.02  Goal of Therapy:  Heparin level 0.3-0.7 units/ml Monitor platelets by anticoagulation protocol: Yes   Plan:  -Heparin 5000 units IV bolus followed heparin infusion at 1450 units/hr -F/u 8 hr HL -Monitor daily HL, CBC and s/s of bleeding  Vinnie Level, PharmD., BCPS, BCCCP Clinical Pharmacist Please refer to [facility] for unit-specific pharmacist

## 2020-08-09 NOTE — ED Notes (Signed)
Pt transported to xray 

## 2020-08-10 ENCOUNTER — Inpatient Hospital Stay (HOSPITAL_COMMUNITY): Payer: Medicare Other

## 2020-08-10 ENCOUNTER — Telehealth: Payer: Self-pay | Admitting: Pulmonary Disease

## 2020-08-10 DIAGNOSIS — M7989 Other specified soft tissue disorders: Secondary | ICD-10-CM

## 2020-08-10 DIAGNOSIS — I2699 Other pulmonary embolism without acute cor pulmonale: Secondary | ICD-10-CM

## 2020-08-10 DIAGNOSIS — E785 Hyperlipidemia, unspecified: Secondary | ICD-10-CM

## 2020-08-10 DIAGNOSIS — I2609 Other pulmonary embolism with acute cor pulmonale: Principal | ICD-10-CM

## 2020-08-10 DIAGNOSIS — I10 Essential (primary) hypertension: Secondary | ICD-10-CM

## 2020-08-10 LAB — ECHOCARDIOGRAM COMPLETE
AR max vel: 2.65 cm2
AV Area VTI: 2.42 cm2
AV Area mean vel: 2.44 cm2
AV Mean grad: 3 mmHg
AV Peak grad: 5.9 mmHg
Ao pk vel: 1.21 m/s
Area-P 1/2: 2.66 cm2
Calc EF: 52.2 %
Height: 61 in
P 1/2 time: 597 msec
S' Lateral: 2.5 cm
Single Plane A2C EF: 56.5 %
Single Plane A4C EF: 43.4 %
Weight: 3035.2 oz

## 2020-08-10 LAB — CBC
HCT: 38.5 % (ref 36.0–46.0)
Hemoglobin: 12.5 g/dL (ref 12.0–15.0)
MCH: 30.7 pg (ref 26.0–34.0)
MCHC: 32.5 g/dL (ref 30.0–36.0)
MCV: 94.6 fL (ref 80.0–100.0)
Platelets: 161 10*3/uL (ref 150–400)
RBC: 4.07 MIL/uL (ref 3.87–5.11)
RDW: 14.5 % (ref 11.5–15.5)
WBC: 5.6 10*3/uL (ref 4.0–10.5)
nRBC: 0 % (ref 0.0–0.2)

## 2020-08-10 LAB — HEPARIN LEVEL (UNFRACTIONATED)
Heparin Unfractionated: 0.9 IU/mL — ABNORMAL HIGH (ref 0.30–0.70)
Heparin Unfractionated: 0.72 IU/mL — ABNORMAL HIGH (ref 0.30–0.70)
Heparin Unfractionated: 0.59 IU/mL (ref 0.30–0.70)

## 2020-08-10 NOTE — TOC Benefit Eligibility Note (Signed)
Transition of Care [facility]) Benefit Eligibility Note    Patient Details  Name: Lynn Brown MRN: [id number] Date of Birth: [birth year]   Medication/Dose: Eliquis and Xarelto  Covered?: Yes     Prescription Coverage Preferred Pharmacy: most major retail pharmacies  Spoke with Person/Company/Phone Number:: [employer] [employer]  Co-Pay: $47 for 30 day retail per rx  Prior Approval: No          Orson Aloe Phone Number: 08/10/2020, 10:51 AM

## 2020-08-10 NOTE — Progress Notes (Signed)
PROGRESS NOTE        PATIENT DETAILS Name: Lynn Brown Age: 87 y.o. Sex: female Date of Birth: [birth year] Admit Date: 08/09/2020 Admitting Physician Emeline General, MD ZCH:YIFOY, Aram Beecham, MD  Brief Narrative: Patient is a 87 y.o. female PAD-s/p left SFA/popliteal artery stenting on 04/2020, HTN, HLD presented with exertional shortness of breath for approximately 1 week.  She was found to have PE and admitted to the hospitalist service.  See below for further details.    Significant events: 3/15>> admit for submassive PE  Significant studies: 3/15>> CTA chest: Extensive pulmonary emboli in the right lung 3/16>> Echo: EF 55-60%, RVSP 77 mmHg-RV systolic function mildly reduced.  Antimicrobial therapy: None  Microbiology data: None  Procedures : None  Consults:   DVT Prophylaxis : Heparin drip   Subjective: Lying comfortably in bed-no chest pain at rest-no shortness of breath when lying down.  Assessment/Plan: Submassive PE: She appears stable-O2 saturations in the low 90s on room air-blood pressure relatively stable.  Although she appears to have echo evidence of RV strain-she is stable clinically-continue IV heparin-we will touch base with the PCCM.  Await lower extremity Doppler.  No obvious aggravating/provoking factors  HTN: BP stable-hold amlodipine-continue with low-dose metoprolol.  HLD: Continue statin  Hypothyroidism: Continue Synthroid  PAD-s/p left SFA/popliteal artery stenting on 04/2020: Admitting MD spoke with vascular surgery-okay to stop Plavix-continue with aspirin.  Obesity: Estimated body mass index is 35.84 kg/m as calculated from the following:   Height as of this encounter: 5\' 1"  (1.549 m).   Weight as of this encounter: 86 kg.    Diet: Diet Order            Diet Heart Room service appropriate? Yes; Fluid consistency: Thin  Diet effective now                  Code Status: Full code   Family  Communication: Will update family later  Disposition Plan: Status is: Inpatient  Remains inpatient appropriate because:Inpatient level of care appropriate due to severity of illness   Dispo: The patient is from: Home              Anticipated d/c is to: Home              Patient currently is not medically stable to d/c.   Difficult to place patient No    Barriers to Discharge: Submassive PE-on IV heparin  Antimicrobial agents: Anti-infectives (From admission, onward)   None       Time spent: 25- minutes-Greater than 50% of this time was spent in counseling, explanation of diagnosis, planning of further management, and coordination of care.  MEDICATIONS: Scheduled Meds: . amLODipine  2.5 mg Oral Daily  . aspirin EC  81 mg Oral Daily  . docusate sodium  100 mg Oral BID  . levothyroxine  112 mcg Oral QAC breakfast  . lidocaine  1 patch Transdermal Q24H  . loratadine  10 mg Oral Daily  . magnesium oxide  400 mg Oral QPM  . metoprolol tartrate  12.5 mg Oral BID  . ofloxacin  1 drop Right Eye QID  . pantoprazole  40 mg Oral Daily  . prednisoLONE acetate  1 drop Right Eye QID  . rosuvastatin  20 mg Oral QPM  . spironolactone  25 mg Oral Daily   Continuous Infusions: .  heparin 1,150 Units/hr (08/10/20 1231)   PRN Meds:.acetaminophen, clorazepate, oxyCODONE   PHYSICAL EXAM: Vital signs: Vitals:   08/09/20 2100 08/10/20 0400 08/10/20 0752 08/10/20 1216  BP:  103/67 119/67 112/70  Pulse:  65 63 64  Resp:  Temp: 98.4 F (36.9 C) 99.4 F (37.4 C) 99 F (37.2 C) 98.1 F (36.7 C)  TempSrc:  Oral Oral Oral  SpO2:  91% 93% 93%  Weight:      Height:       Filed Weights   08/09/20 1711 08/09/20 2041  Weight: 85.7 kg 86 kg   Body mass index is 35.84 kg/m.   Gen Exam:Alert awake-not in any distress HEENT:atraumatic, normocephalic Chest: B/L clear to auscultation anteriorly CVS:S1S2 regular Abdomen:soft non tender, non distended Extremities:no  edema Neurology: Non focal Skin: no rash  I have personally reviewed following labs and imaging studies  LABORATORY DATA: CBC: Recent Labs  Lab 08/09/20 1041 08/10/20 0209  WBC 5.4 5.6  HGB 13.4 12.5  HCT 42.3 38.5  MCV 96.6 94.6  PLT 173 161    Basic Metabolic Panel: Recent Labs  Lab 08/09/20 1041  NA 138  K 4.1  CL 109  CO2 22  GLUCOSE 118*  BUN 22  CREATININE 1.02*  CALCIUM 9.1    GFR: Estimated Creatinine Clearance: 39.4 mL/min (A) (by C-G formula based on SCr of 1.02 mg/dL (H)).  Liver Function Tests: No results for input(s): AST, ALT, ALKPHOS, BILITOT, PROT, ALBUMIN in the last 168 hours. No results for input(s): LIPASE, AMYLASE in the last 168 hours. No results for input(s): AMMONIA in the last 168 hours.  Coagulation Profile: No results for input(s): INR, PROTIME in the last 168 hours.  Cardiac Enzymes: No results for input(s): CKTOTAL, CKMB, CKMBINDEX, TROPONINI in the last 168 hours.  BNP (last 3 results) No results for input(s): PROBNP in the last 8760 hours.  Lipid Profile: No results for input(s): CHOL, HDL, LDLCALC, TRIG, CHOLHDL, LDLDIRECT in the last 72 hours.  Thyroid Function Tests: No results for input(s): TSH, T4TOTAL, FREET4, T3FREE, THYROIDAB in the last 72 hours.  Anemia Panel: No results for input(s): VITAMINB12, FOLATE, FERRITIN, TIBC, IRON, RETICCTPCT in the last 72 hours.  Urine analysis:    Component Value Date/Time   COLORURINE STRAW (A) 06/01/2017 2029   APPEARANCEUR CLEAR 06/01/2017 2029   LABSPEC 1.010 06/01/2017 2029   PHURINE 6.0 06/01/2017 2029   GLUCOSEU NEGATIVE 06/01/2017 2029   HGBUR NEGATIVE 06/01/2017 2029   BILIRUBINUR NEGATIVE 06/01/2017 2029   KETONESUR NEGATIVE 06/01/2017 2029   PROTEINUR 30 (A) 06/01/2017 2029   UROBILINOGEN 0.2 01/04/2012 1450   NITRITE NEGATIVE 06/01/2017 2029   LEUKOCYTESUR MODERATE (A) 06/01/2017 2029    Sepsis Labs: Lactic Acid, Venous No results found for:  LATICACIDVEN  MICROBIOLOGY: Recent Results (from the past 240 hour(s))  SARS CORONAVIRUS 2 (TAT 6-24 HRS) Nasopharyngeal Nasopharyngeal Swab     Status: None   Collection Time: 08/09/20 12:49 PM   Specimen: Nasopharyngeal Swab  Result Value Ref Range Status   SARS Coronavirus 2 NEGATIVE NEGATIVE Final    Comment: (NOTE) SARS-CoV-2 target nucleic acids are NOT DETECTED.  The SARS-CoV-2 RNA is generally detectable in upper and lower respiratory specimens during the acute phase of infection. Negative results do not preclude SARS-CoV-2 infection, do not rule out co-infections with other pathogens, and should not be used as the sole basis for treatment or other patient management decisions. Negative results must be combined with clinical observations,  patient history, and epidemiological information. The expected result is Negative.  Fact Sheet for Patients: HairSlick.nohttps://www.fda.gov/media/138098/download  Fact Sheet for Healthcare Providers: quierodirigir.comhttps://www.fda.gov/media/138095/download  This test is not yet approved or cleared by the [place] FDA and  has been authorized for detection and/or diagnosis of SARS-CoV-2 by FDA under an Emergency Use Authorization (EUA). This EUA will remain  in effect (meaning this test can be used) for the duration of the COVID-19 declaration under Se ction 564(b)(1) of the Act, 21 U.S.C. section 360bbb-3(b)(1), unless the authorization is terminated or revoked sooner.  Performed at [facility], [address] [place], [place] [address]     RADIOLOGY STUDIES/RESULTS: DG Chest 2 View  Result Date: 08/09/2020 CLINICAL DATA:  Shortness of breath for 2 weeks. Right-sided rib pain. EXAM: CHEST - 2 VIEW COMPARISON:  03/09/2018. FINDINGS: Lungs are suboptimally inflated. Head aortic atherosclerosis. Normal heart size. Asymmetric hazy opacities within the left midlung and left lower lobe are noted. The right hilum appears asymmetrically enlarged and  increased in size compared with previous imaging. Multi level degenerative disc disease identified within the thoracic spine. IMPRESSION: 1. Asymmetric hazy opacities within the left midlung and left lower lobe concerning for pneumonia. 2. Asymmetric enlargement of the right hilum. This is nonspecific and may reflect AP technique and hypoinflation. Recommend further evaluation with nonemergent contrast-enhanced CT of the chest. Electronically Signed   By: Signa Kellaylor  Stroud M.D.   On: 08/09/2020 11:18   DG Ribs Unilateral Right  Result Date: 08/09/2020 CLINICAL DATA:  Right rib pain and shortness of breath for 2 weeks. EXAM: RIGHT RIBS - 2 VIEW COMPARISON:  Chest radiograph of earlier today. FINDINGS: Two views of right-sided ribs demonstrate no displaced fracture. No pleural fluid or pneumothorax. Osteopenia. S shaped thoracic spine curvature. IMPRESSION: No acute osseous abnormality. Electronically Signed   By: Jeronimo GreavesKyle  Talbot M.D.   On: 08/09/2020 12:20   CT Angio Chest PE W and/or Wo Contrast  Result Date: 08/09/2020 CLINICAL DATA:  Chest pain, shortness of breath EXAM: CT ANGIOGRAPHY CHEST WITH CONTRAST TECHNIQUE: Multidetector CT imaging of the chest was performed using the standard protocol during bolus administration of intravenous contrast. Multiplanar CT image reconstructions and MIPs were obtained to evaluate the vascular anatomy. CONTRAST:  74mL OMNIPAQUE IOHEXOL 350 MG/ML SOLN COMPARISON:  Chest x-ray today FINDINGS: Cardiovascular: Extensive pulmonary embolus noted in the right pulmonary arteries including right main pulmonary artery with occlusion of the right lower lobe pulmonary artery. Pulmonary emboli also noted in the right upper lobe. No pulmonary emboli on the left. Evidence of right heart strain with an RV/LV ratio of 1.22. Reflux of contrast into the IVC and hepatic veins suggests right heart dysfunction. Coronary artery and aortic atherosclerosis. No aortic aneurysm. Mediastinum/Nodes: No  mediastinal, hilar, or axillary adenopathy. Trachea and esophagus are unremarkable. Thyroid unremarkable. Lungs/Pleura: Atelectasis noted medially and posteriorly in the left lung base. No effusions or confluent opacities otherwise. Upper Abdomen: Scattered hypodensities throughout the liver cannot be characterized on this study, favor cysts. Musculoskeletal: Chest wall soft tissues are unremarkable. No acute bony abnormality. Review of the MIP images confirms the above findings. IMPRESSION: Extensive pulmonary emboli within the right lung. CT evidence of right heart strain (RV/LV Ratio = 1.22) consistent with at least submassive (intermediate risk) PE. The presence of right heart strain has been associated with an increased risk of morbidity and mortality. Coronary artery disease. Atelectasis in the left lower lobe. Aortic Atherosclerosis (ICD10-I70.0). These results were called by telephone at the  time of interpretation on 08/09/2020 at 1:56 pm to provider [place] , who verbally acknowledged these results. Electronically Signed   By: Charlett Nose M.D.   On: 08/09/2020 13:57   ECHOCARDIOGRAM COMPLETE  Result Date: 08/10/2020    ECHOCARDIOGRAM REPORT   Patient Name:   Lynn Brown Date of Exam: 08/10/2020 Medical Rec #:  [id number]      Height:       61.0 in Accession #:    [id number]     Weight:       189.7 lb Date of Birth:  [birth year]       BSA:          1.847 m Patient Age:    88 years       BP:           119/67 mmHg Patient Gender: F              HR:           68 bpm. Exam Location:  Inpatient Procedure: 2D Echo, 3D Echo, Cardiac Doppler, Color Doppler and Strain Analysis Indications:    Pulmonary Embolus  History:        Patient has prior history of Echocardiogram examinations, most                 recent 09/25/2017. Signs/Symptoms:Murmur.  Sonographer:    Neomia Dear RDCS Referring Phys: [id number] Emeline General IMPRESSIONS  1. Findings consistent with acute PE and RV volume/pressure overload (cor  pulmonale). RVSP severely elevated ~77 mmHG.  2. Left ventricular ejection fraction, by estimation, is 55 to 60%. The left ventricle has normal function. The left ventricle has no regional wall motion abnormalities. There is mild concentric left ventricular hypertrophy. Left ventricular diastolic parameters are consistent with Grade I diastolic dysfunction (impaired relaxation). There is the interventricular septum is flattened in systole and diastole, consistent with right ventricular pressure and volume overload. The average left ventricular global longitudinal strain is -13.4 %. The global longitudinal strain is abnormal.  3. Right ventricular systolic function is mildly reduced. The right ventricular size is moderately enlarged. There is severely elevated pulmonary artery systolic pressure. The estimated right ventricular systolic pressure is 76.6 mmHg.  4. Right atrial size was mild to moderately dilated.  5. The mitral valve is grossly normal. Trivial mitral valve regurgitation. No evidence of mitral stenosis.  6. Tricuspid valve regurgitation is moderate.  7. The aortic valve is tricuspid. Aortic valve regurgitation is trivial. No aortic stenosis is present.  8. The inferior vena cava is normal in size with greater than 50% respiratory variability, suggesting right atrial pressure of 3 mmHg. Comparison(s): Changes from prior study are noted. Acute RV failure/cor pulmonale is present. Conclusion(s)/Recommendation(s): Findings consistent with Cor Pulmonale. FINDINGS  Left Ventricle: Left ventricular ejection fraction, by estimation, is 55 to 60%. The left ventricle has normal function. The left ventricle has no regional wall motion abnormalities. The average left ventricular global longitudinal strain is -13.4 %. The global longitudinal strain is abnormal. 3D left ventricular ejection fraction analysis performed but not reported based on interpreter judgement due to suboptimal quality. The left ventricular  internal cavity size was normal in size. There is mild concentric left ventricular hypertrophy. The interventricular septum is flattened in systole and diastole, consistent with right ventricular pressure and volume overload. Left ventricular diastolic parameters are consistent with Grade I diastolic dysfunction (impaired relaxation). Right Ventricle: The right ventricular size is moderately enlarged. No increase in right ventricular wall  thickness. Right ventricular systolic function is mildly reduced. There is severely elevated pulmonary artery systolic pressure. The tricuspid regurgitant velocity is 4.29 m/s, and with an assumed right atrial pressure of 3 mmHg, the estimated right ventricular systolic pressure is 76.6 mmHg. Left Atrium: Left atrial size was normal in size. Right Atrium: Right atrial size was mild to moderately dilated. Pericardium: Trivial pericardial effusion is present. Mitral Valve: The mitral valve is grossly normal. Trivial mitral valve regurgitation. No evidence of mitral valve stenosis. Tricuspid Valve: The tricuspid valve is grossly normal. Tricuspid valve regurgitation is moderate . No evidence of tricuspid stenosis. Aortic Valve: The aortic valve is tricuspid. Aortic valve regurgitation is trivial. Aortic regurgitation PHT measures 597 msec. No aortic stenosis is present. Aortic valve mean gradient measures 3.0 mmHg. Aortic valve peak gradient measures 5.9 mmHg. Aortic valve area, by VTI measures 2.42 cm. Pulmonic Valve: The pulmonic valve was grossly normal. Pulmonic valve regurgitation is mild. No evidence of pulmonic stenosis. Aorta: The aortic root and ascending aorta are structurally normal, with no evidence of dilitation. Venous: The right upper pulmonary vein is normal. The inferior vena cava is normal in size with greater than 50% respiratory variability, suggesting right atrial pressure of 3 mmHg. IAS/Shunts: There is left bowing of the interatrial septum, suggestive of  elevated right atrial pressure. The atrial septum is grossly normal.  LEFT VENTRICLE PLAX 2D LVIDd:         3.70 cm     Diastology LVIDs:         2.50 cm     LV e' medial:    2.89 cm/s LV PW:         1.20 cm     LV E/e' medial:  19.7 LV IVS:        1.31 cm     LV e' lateral:   4.73 cm/s LVOT diam:     2.00 cm     LV E/e' lateral: 12.0 LV SV:         58 LV SV Index:   31          2D Longitudinal Strain LVOT Area:     3.14 cm    2D Strain GLS Avg:     -13.4 %  LV Volumes (MOD) LV vol d, MOD A2C: 36.1 ml 3D Volume EF: LV vol d, MOD A4C: 50.7 ml 3D EF:        82 % LV vol s, MOD A2C: 15.7 ml LV EDV:       235 ml LV vol s, MOD A4C: 28.7 ml LV ESV:       43 ml LV SV MOD A2C:     20.4 ml LV SV:        192 ml LV SV MOD A4C:     50.7 ml LV SV MOD BP:      24.2 ml RIGHT VENTRICLE TAPSE (M-mode): 1.5 cm  PULMONARY VEINS                         A Reversal Duration: 106.00 msec                         A Reversal Velocity: 24.40 cm/s                         Diastolic Velocity:  25.30 cm/s  S/D Velocity:        1.90                         Systolic Velocity:   47.10 cm/s LEFT ATRIUM             Index       RIGHT ATRIUM           Index LA diam:        3.50 cm 1.90 cm/m  RA Area:     29.80 cm LA Vol (A2C):   63.0 ml 34.11 ml/m RA Volume:   117.00 ml 63.35 ml/m LA Vol (A4C):   40.7 ml 22.04 ml/m LA Biplane Vol: 52.3 ml 28.32 ml/m  AORTIC VALVE                   PULMONIC VALVE AV Area (Vmax):    2.65 cm    PV Vmax:       0.80 m/s AV Area (Vmean):   2.44 cm    PV Vmean:      61.000 cm/s AV Area (VTI):     2.42 cm    PV VTI:        0.215 m AV Vmax:           121.00 cm/s PV Peak grad:  2.6 mmHg AV Vmean:          83.300 cm/s PV Mean grad:  2.0 mmHg AV VTI:            0.239 m AV Peak Grad:      5.9 mmHg AV Mean Grad:      3.0 mmHg LVOT Vmax:         102.00 cm/s LVOT Vmean:        64.600 cm/s LVOT VTI:          0.184 m LVOT/AV VTI ratio: 0.77 AI PHT:            597 msec  AORTA Ao Root diam: 3.30 cm Ao Asc  diam:  3.20 cm MITRAL VALVE                TRICUSPID VALVE MV Area (PHT): 2.66 cm     TR Peak grad:   73.6 mmHg MV Decel Time: 285 msec     TR Vmax:        429.00 cm/s MV E velocity: 56.80 cm/s MV A velocity: 101.00 cm/s  SHUNTS MV E/A ratio:  0.56         Systemic VTI:  0.18 m                             Systemic Diam: 2.00 cm Lennie Odor MD Electronically signed by Lennie Odor MD Signature Date/Time: 08/10/2020/12:04:30 PM    Final      LOS: 1 day   Jeoffrey Massed, MD  [facility]    To contact the attending provider between 7A-7P or the covering provider during after hours 7P-7A, please log into the web site [email or web address] and access using universal [place] password for that web site. If you do not have the password, please call the hospital operator.  08/10/2020, 1:46 PM

## 2020-08-10 NOTE — Care Management (Signed)
Bene fit check for xaralto and eliquis submitted

## 2020-08-10 NOTE — Progress Notes (Signed)
ANTICOAGULATION CONSULT NOTE   Pharmacy Consult for Heparin Indication: pulmonary embolus  No Known Allergies  Patient Measurements: Height: 5\' 1"  (154.9 cm) Weight: 86 kg (189 lb 11.2 oz) IBW/kg (Calculated) : 47.8 Heparin Dosing Weight: 86 kg   Vital Signs: Temp: 98.4 F (36.9 C) (03/15 2100) Temp Source: Oral (03/15 2041) BP: 120/81 (03/15 2041) Pulse Rate: 66 (03/15 2041)  Labs: Recent Labs    08/09/20 1041 08/09/20 1253 08/10/20 0209  HGB 13.4  --  12.5  HCT 42.3  --  38.5  PLT 173  --  161  HEPARINUNFRC  --   --  0.90*  CREATININE 1.02*  --   --   TROPONINIHS 22* 22*  --     Estimated Creatinine Clearance: 39.4 mL/min (A) (by C-G formula based on SCr of 1.02 mg/dL (H)).   Medical History: Past Medical History:  Diagnosis Date  . Allergic rhinitis   . Anxiety   . Bunion    PES PLANUS  . Cervical disc disease   . Hearing loss    HEARING AIDS, BUT DOES NOT WEAR THEM CONSISTENTLY.   08/12/20 Hypercholesteremia   . Hypertension   . Hypothyroid   . Leaky heart valve    DR. HARWANI  . Mild obesity   . Osteoarthritis of knees, bilateral   . Osteoporosis 12/2017   T score -3.3  . Venous insufficiency     Medications:  Medications Prior to Admission  Medication Sig Dispense Refill Last Dose  . acetaminophen (TYLENOL) 500 MG tablet Take 500-1,000 mg by mouth as needed (for pain).   unknown  . amLODipine (NORVASC) 2.5 MG tablet TAKE 1 TABLET BY MOUTH  DAILY (Patient taking differently: Take 2.5 mg by mouth daily.) 90 tablet 3 08/09/2020 at Unknown time  . ascorbic acid (VITAMIN C) 500 MG tablet Take 500 mg by mouth in the morning and at bedtime.   Past Week at Unknown time  . aspirin EC 81 MG tablet Take 1 tablet (81 mg total) by mouth daily. Swallow whole. 90 tablet 3 08/09/2020 at Unknown time  . Calcium Citrate-Vitamin D 315-250 MG-UNIT TABS Take 1 tablet by mouth daily.   unknown  . cetirizine (ZYRTEC) 10 MG tablet Take 10 mg by mouth daily as needed  (allergies.).    unknown  . Cholecalciferol (VITAMIN D) 2000 UNITS tablet Take 2,000 Units by mouth daily.   08/08/2020 at Unknown time  . clopidogrel (PLAVIX) 75 MG tablet Take 1 tablet (75 mg total) by mouth daily. 30 tablet 11 08/09/2020 at 0800  . clorazepate (TRANXENE) 7.5 MG tablet Take 7.5 mg by mouth daily as needed for anxiety.    08/06/2020  . Coenzyme Q10 (COQ10) 50 MG CAPS Take 50 mg by mouth daily at 2 PM.   08/08/2020 at Unknown time  . levothyroxine (SYNTHROID) 112 MCG tablet Take 112 mcg by mouth every morning.   08/09/2020 at Unknown time  . magnesium oxide (MAG-OX) 400 MG tablet Take 400 mg by mouth every evening.    08/08/2020 at Unknown time  . metoprolol tartrate (LOPRESSOR) 25 MG tablet Take 0.5 tablets (12.5 mg total) by mouth 2 (two) times daily. 60 tablet 0 08/09/2020 at 0800  . nitroGLYCERIN (NITROSTAT) 0.4 MG SL tablet Place 1 tablet (0.4 mg total) under the tongue every 5 (five) minutes as needed for chest pain. (Patient taking differently: Place 0.4 mg under the tongue every 5 (five) minutes x 3 doses as needed for chest pain.) 30 tablet 0 unknown  .  ofloxacin (OCUFLOX) 0.3 % ophthalmic solution Place 1 drop into the right eye 4 (four) times daily for 21 days. 5 mL 0 08/09/2020 at Unknown time  . prednisoLONE acetate (PRED FORTE) 1 % ophthalmic suspension Place 1 drop into the right eye 4 (four) times daily for 21 days. 5 mL 0 08/09/2020 at Unknown time  . rosuvastatin (CRESTOR) 20 MG tablet Take 20 mg by mouth every evening.    08/08/2020 at Unknown time  . spironolactone (ALDACTONE) 25 MG tablet TAKE 1 TABLET BY MOUTH  DAILY (Patient taking differently: Take 25 mg by mouth daily.) 90 tablet 3 08/08/2020 at Unknown time    Assessment: Lynn Brown who presented with SOB found to have R sided submassive PE with R heart strain. Pharmacy consulted to start IV heparin.   H/H and Plt wnl. SCr 1.02  3/16 AM update:  Heparin level above goal  No issues per RN  Goal of Therapy:  Heparin  level 0.3-0.7 units/ml Monitor platelets by anticoagulation protocol: Yes   Plan:  -Dec heparin to 1150 units/hr -Re-check heparin level in 8 hours  Abran Duke, PharmD, BCPS Clinical Pharmacist Phone: [phone]

## 2020-08-10 NOTE — Plan of Care (Signed)
  Problem: Clinical Measurements: Goal: Will remain free from infection Outcome: Progressing Goal: Diagnostic test results will improve Outcome: Progressing   Problem: Nutrition: Goal: Adequate nutrition will be maintained Outcome: Progressing   Problem: Pain Managment: Goal: General experience of comfort will improve Outcome: Progressing   Problem: Safety: Goal: Ability to remain free from injury will improve Outcome: Progressing   

## 2020-08-10 NOTE — Progress Notes (Signed)
ANTICOAGULATION CONSULT NOTE - Follow Up Consult  Pharmacy Consult for IV Heparin Indication: pulmonary embolus  No Known Allergies  Patient Measurements: Height: 5\' 1"  (154.9 cm) Weight: 86 kg (189 lb 11.2 oz) IBW/kg (Calculated) : 47.8 Heparin Dosing Weight: 67.6 kg   Vital Signs: Temp: 97.9 F (36.6 C) (03/16 1635) Temp Source: Oral (03/16 1635) BP: 109/61 (03/16 1635) Pulse Rate: 65 (03/16 1635)  Labs: Recent Labs    08/09/20 1041 08/09/20 1253 08/10/20 0209 08/10/20 1136 08/10/20 1813  HGB 13.4  --  12.5  --   --   HCT 42.3  --  38.5  --   --   PLT 173  --  161  --   --   HEPARINUNFRC  --   --  0.90* 0.72* 0.59  CREATININE 1.02*  --   --   --   --   TROPONINIHS 22* 22*  --   --   --     Estimated Creatinine Clearance: 39.4 mL/min (A) (by C-G formula based on SCr of 1.02 mg/dL (H)).   Medical History: Past Medical History:  Diagnosis Date  . Allergic rhinitis   . Anxiety   . Bunion    PES PLANUS  . Cervical disc disease   . Hearing loss    HEARING AIDS, BUT DOES NOT WEAR THEM CONSISTENTLY.   08/12/20 Hypercholesteremia   . Hypertension   . Hypothyroid   . Leaky heart valve    DR. HARWANI  . Mild obesity   . Osteoarthritis of knees, bilateral   . Osteoporosis 12/2017   T score -3.3  . Venous insufficiency     Assessment: 87 yr old female presented with SOB, was found to have R-sided submassive PE with R heart strain. Pharmacy was consulted to dose IV heparin; pt was not on anticoagulant PTA.  Heparin level this evening on heparin infusion at 1150 units/hr (~6.5 hrs after last heparin level on same infusion rate, which was 0.72 units/ml) was 0.59 units/ml, which is within the goal range for this pt. H/H, plt WNL, but trending down since admission. Per RN, no issues with IV or bleeding observed.  Goal of Therapy:  Heparin level 0.3-0.7 units/ml Monitor platelets by anticoagulation protocol: Yes   Plan:  Continue heparin infusion at 1150 units/hr Check  confirmatory heparin level in 8 hrs Monitor daily heparin level, CBC Monitor for bleeding F/U transition to oral anticoagulant when able  88, PharmD, BCPS, [facility] Clinical Pharmacist

## 2020-08-10 NOTE — Telephone Encounter (Signed)
Staff message received from Dr. Everardo All:  Luciano Cutter, MD  P Lbpu Triage Pool Please schedule new patient consult for pulmonary embolism and abnormal echo.    Pt is currently admitted at [facility]. So the consult will show up on pt's discharge AVS, I have scheduled pt for a consult with Dr. Judeth Horn 09/01/2020 at 2:30. If this appt does not work, pt can call the office to get appt rescheduled. Nothing further needed.

## 2020-08-10 NOTE — Evaluation (Signed)
Physical Therapy Evaluation Patient Details Name: Lynn Brown MRN: [id number] DOB: [birth year] Today's Date: 08/10/2020   History of Present Illness  87 y.o. female presented with right-sided chest pain and shortness of breath which has been going on for about a month gradually getting worse. 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 Pt reports complete independence prior to onset of R flank pain about a month ago, since then she has been able to do her own bathing and dressing but her daughter has been doing most of the cooking and cleaning. Pt is limited in safe mobility today by increased R flank pain and increased RR and associated increase in HR with ambulation due to shallow breathing to keep from elliciting increase flank pain. Pt is supervision for transfers and ambulation of 100 feet without AD. PT recommending HHPT to help pt return to PLOF of limited community distance ambulation. PT will continue to follow acutely for stair training prior to d/c home.    Follow Up Recommendations Home health PT;Supervision for mobility/OOB    Equipment Recommendations  None recommended by PT       Precautions / Restrictions Precautions Precautions: Fall Restrictions Weight Bearing Restrictions: No      Mobility  Bed Mobility               General bed mobility comments: on BSC being washed up on entry    Transfers Overall transfer level: Needs assistance   Transfers: Sit to/from Stand Sit to Stand: Supervision         General transfer comment: supervision for safety, good power up and self steadying prior to initiation of ambulation  Ambulation/Gait Ambulation/Gait assistance: Supervision Gait Distance (Feet):  100 Feet Assistive device: None Gait Pattern/deviations: Step-through pattern;Shuffle;Decreased stance time - right;Decreased weight shift to right Gait velocity: slowed Gait velocity interpretation: <1.8 ft/sec, indicate of risk for recurrent falls General Gait Details: supervision for safety, slow gait with decreased R knee flexion due to knee OA, causing hip hike to advance         Balance Overall balance assessment: Mild deficits observed, not formally tested                                           Pertinent Vitals/Pain Pain Assessment: 0-10 Pain Score: 7  Pain Location: R flank and R knee with ambulation Pain Descriptors / Indicators: Sharp;Stabbing Pain Intervention(s): Limited activity within patient's tolerance;Monitored during session;Repositioned    Home Living Family/patient expects to be discharged to:: Private residence Living Arrangements: Children Available Help at Discharge: Family;Available 24 hours/day Type of Home: House Home Access: Level entry     Home Layout: Multi-level Home Equipment: Shower seat;Cane - single point      Prior Function Level of Independence: Independent;Needs assistance   Gait / Transfers Assistance Needed: ambulates without AD in house and limited community distances wtih cane  ADL's / Homemaking Assistance Needed: independent with iADLs, since PE has not had energy and daughter assists with iADLs           Extremity/Trunk Assessment   Upper Extremity Assessment Upper Extremity Assessment: Overall WFL for tasks assessed  Lower Extremity Assessment Lower Extremity Assessment: RLE deficits/detail;Generalized weakness RLE Deficits / Details: R knee ROM limited and painful secondary to OA, needs TKA however not a candidate,    Cervical / Trunk Assessment Cervical / Trunk Assessment: Kyphotic  Communication   Communication: No difficulties;HOH (wears hearing aides not present)  Cognition  Arousal/Alertness: Awake/alert Behavior During Therapy: WFL for tasks assessed/performed Overall Cognitive Status: Within Functional Limits for tasks assessed                                        General Comments General comments (skin integrity, edema, etc.): SaO2 on RA >93%O2 throughout session, with ambulation HR max noted 110bpm RR increased from rest of 18 to 45 due to shallow breath to decrease onset of R flank pain with deep inhalation        Assessment/Plan    PT Assessment Patient needs continued PT services  PT Problem List Cardiopulmonary status limiting activity;Decreased range of motion;Decreased activity tolerance;Pain       PT Treatment Interventions Gait training;Stair training;Functional mobility training;Therapeutic activities;Therapeutic exercise;Balance training;Cognitive remediation;Patient/family education    PT Goals (Current goals can be found in the Care Plan section)  Acute Rehab PT Goals Patient Stated Goal: live to 100 PT Goal Formulation: With patient Time For Goal Achievement: 08/24/20 Potential to Achieve Goals: Good    Frequency Min 3X/week    AM-PAC PT "6 Clicks" Mobility  Outcome Measure Help needed turning from your back to your side while in a flat bed without using bedrails?: None Help needed moving from lying on your back to sitting on the side of a flat bed without using bedrails?: None Help needed moving to and from a bed to a chair (including a wheelchair)?: None Help needed standing up from a chair using your arms (e.g., wheelchair or bedside chair)?: None Help needed to walk in hospital room?: None Help needed climbing 3-5 steps with a railing? : A Little 6 Click Score: 23    End of Session Equipment Utilized During Treatment: Gait belt Activity Tolerance: Patient tolerated treatment well Patient left: in chair;with call bell/phone within reach;with chair alarm set Nurse Communication: Mobility status PT Visit  Diagnosis: Other abnormalities of gait and mobility (R26.89);Difficulty in walking, not elsewhere classified (R26.2)    Time: 2725-3664 PT Time Calculation (min) (ACUTE ONLY): 26 min   Charges:   PT Evaluation $PT Eval Moderate Complexity: 1 Mod PT Treatments $Therapeutic Exercise: 8-22 mins        Elizabeth B. Beverely Risen PT, DPT Acute Rehabilitation Services Pager [phone] Office [phone]   Elon Alas Fleet 08/10/2020, 9:41 AM

## 2020-08-10 NOTE — Progress Notes (Signed)
ANTICOAGULATION CONSULT NOTE   Pharmacy Consult for Heparin Indication: pulmonary embolus  No Known Allergies  Patient Measurements: Height: 5\' 1"  (154.9 cm) Weight: 86 kg (189 lb 11.2 oz) IBW/kg (Calculated) : 47.8 Heparin Dosing Weight: 86 kg   Vital Signs: Temp: 98.1 F (36.7 C) (03/16 1216) Temp Source: Oral (03/16 1216) BP: 112/70 (03/16 1216) Pulse Rate: 64 (03/16 1216)  Labs: Recent Labs    08/09/20 1041 08/09/20 1253 08/10/20 0209 08/10/20 1136  HGB 13.4  --  12.5  --   HCT 42.3  --  38.5  --   PLT 173  --  161  --   HEPARINUNFRC  --   --  0.90* 0.72*  CREATININE 1.02*  --   --   --   TROPONINIHS 22* 22*  --   --     Estimated Creatinine Clearance: 39.4 mL/min (A) (by C-G formula based on SCr of 1.02 mg/dL (H)).   Medical History: Past Medical History:  Diagnosis Date  . Allergic rhinitis   . Anxiety   . Bunion    PES PLANUS  . Cervical disc disease   . Hearing loss    HEARING AIDS, BUT DOES NOT WEAR THEM CONSISTENTLY.   08/12/20 Hypercholesteremia   . Hypertension   . Hypothyroid   . Leaky heart valve    DR. HARWANI  . Mild obesity   . Osteoarthritis of knees, bilateral   . Osteoporosis 12/2017   T score -3.3  . Venous insufficiency     Medications:  Medications Prior to Admission  Medication Sig Dispense Refill Last Dose  . acetaminophen (TYLENOL) 500 MG tablet Take 500-1,000 mg by mouth as needed (for pain).   unknown  . amLODipine (NORVASC) 2.5 MG tablet TAKE 1 TABLET BY MOUTH  DAILY (Patient taking differently: Take 2.5 mg by mouth daily.) 90 tablet 3 08/09/2020 at Unknown time  . ascorbic acid (VITAMIN C) 500 MG tablet Take 500 mg by mouth in the morning and at bedtime.   Past Week at Unknown time  . aspirin EC 81 MG tablet Take 1 tablet (81 mg total) by mouth daily. Swallow whole. 90 tablet 3 08/09/2020 at Unknown time  . Calcium Citrate-Vitamin D 315-250 MG-UNIT TABS Take 1 tablet by mouth daily.   unknown  . cetirizine (ZYRTEC) 10 MG tablet  Take 10 mg by mouth daily as needed (allergies.).    unknown  . Cholecalciferol (VITAMIN D) 2000 UNITS tablet Take 2,000 Units by mouth daily.   08/08/2020 at Unknown time  . clopidogrel (PLAVIX) 75 MG tablet Take 1 tablet (75 mg total) by mouth daily. 30 tablet 11 08/09/2020 at 0800  . clorazepate (TRANXENE) 7.5 MG tablet Take 7.5 mg by mouth daily as needed for anxiety.    08/06/2020  . Coenzyme Q10 (COQ10) 50 MG CAPS Take 50 mg by mouth daily at 2 PM.   08/08/2020 at Unknown time  . levothyroxine (SYNTHROID) 112 MCG tablet Take 112 mcg by mouth every morning.   08/09/2020 at Unknown time  . magnesium oxide (MAG-OX) 400 MG tablet Take 400 mg by mouth every evening.    08/08/2020 at Unknown time  . metoprolol tartrate (LOPRESSOR) 25 MG tablet Take 0.5 tablets (12.5 mg total) by mouth 2 (two) times daily. 60 tablet 0 08/09/2020 at 0800  . nitroGLYCERIN (NITROSTAT) 0.4 MG SL tablet Place 1 tablet (0.4 mg total) under the tongue every 5 (five) minutes as needed for chest pain. (Patient taking differently: Place 0.4 mg under  the tongue every 5 (five) minutes x 3 doses as needed for chest pain.) 30 tablet 0 unknown  . ofloxacin (OCUFLOX) 0.3 % ophthalmic solution Place 1 drop into the right eye 4 (four) times daily for 21 days. 5 mL 0 08/09/2020 at Unknown time  . prednisoLONE acetate (PRED FORTE) 1 % ophthalmic suspension Place 1 drop into the right eye 4 (four) times daily for 21 days. 5 mL 0 08/09/2020 at Unknown time  . rosuvastatin (CRESTOR) 20 MG tablet Take 20 mg by mouth every evening.    08/08/2020 at Unknown time  . spironolactone (ALDACTONE) 25 MG tablet TAKE 1 TABLET BY MOUTH  DAILY (Patient taking differently: Take 25 mg by mouth daily.) 90 tablet 3 08/08/2020 at Unknown time    Assessment: 89 YOF who presented with SOB found to have R sided submassive PE with R heart strain. Pharmacy consulted to start IV heparin.   3/16 update:  HL drawn at 2:09am was 0.90 Decreased hep rate to 1150 units/hr. 8  hour HL ordered. HL redrawn at 11:00am was 0.72  H/H and Plt wnl. Scr 1.02    Goal of Therapy:  Heparin level 0.3-0.7 units/ml Monitor platelets by anticoagulation protocol: Yes   Plan:  -Continue heparin to 1150 units/hr -Re-check heparin level in 6 hours (1800)  Arrie Aran Pharm-D Candidate 08-11-2019 01:39PM

## 2020-08-10 NOTE — Progress Notes (Signed)
Patient arrived on unit in no acute distress and  accompanied by her sister.  She is alert and oriented and responded appropriately to all questions.  Patient was oriented to the unit.  Vitals sign, assessment, and complete and CHG bath completed.  She is in bed resting, bed in low position and call bell within reach.  Will continue to monitor.

## 2020-08-10 NOTE — Progress Notes (Signed)
Pulmonary was contacted by primary team regarding echocardiogram findings in setting of recently diagnosed right pulmonary embolism. On chart review patient is hemodynamically stable on room air and normal BP. Echo reviewed with findings significant for elevated RVSP, grade I DD and evidence of increased RV pressure and volume overload. This is consistent with her PE. Would continue anticoagulation and plan for repeat echocardiogram in one month  Will arrange to follow-up with me in Pulmonary clinic. Will order imaging studies on that visit.  Mechele Collin, M.D. [facility] Pulmonary/Critical Care Medicine 08/10/2020 2:44 PM

## 2020-08-10 NOTE — Telephone Encounter (Signed)
Great job [employer] !!! She had PE and if not properly triaged, could have died.

## 2020-08-11 ENCOUNTER — Other Ambulatory Visit: Payer: Self-pay | Admitting: Internal Medicine

## 2020-08-11 ENCOUNTER — Encounter (INDEPENDENT_AMBULATORY_CARE_PROVIDER_SITE_OTHER): Payer: Medicare Other | Admitting: Ophthalmology

## 2020-08-11 LAB — CBC
HCT: 38.1 % (ref 36.0–46.0)
Hemoglobin: 12.3 g/dL (ref 12.0–15.0)
MCH: 30.5 pg (ref 26.0–34.0)
MCHC: 32.3 g/dL (ref 30.0–36.0)
MCV: 94.5 fL (ref 80.0–100.0)
Platelets: 162 10*3/uL (ref 150–400)
RBC: 4.03 MIL/uL (ref 3.87–5.11)
RDW: 14.5 % (ref 11.5–15.5)
WBC: 4.9 10*3/uL (ref 4.0–10.5)
nRBC: 0 % (ref 0.0–0.2)

## 2020-08-11 LAB — HEPARIN LEVEL (UNFRACTIONATED): Heparin Unfractionated: 0.85 IU/mL — ABNORMAL HIGH (ref 0.30–0.70)

## 2020-08-11 MED ORDER — APIXABAN 5 MG PO TABS
10.0000 mg | ORAL_TABLET | Freq: Two times a day (BID) | ORAL | Status: DC
  Administered 2020-08-11 – 2020-08-12 (×3): 10 mg via ORAL
  Filled 2020-08-11 (×4): qty 2

## 2020-08-11 MED ORDER — APIXABAN 5 MG PO TABS: 5.0000 mg | ORAL_TABLET | Freq: Two times a day (BID) | ORAL | Status: DC

## 2020-08-11 MED ORDER — APIXABAN (ELIQUIS) VTE STARTER PACK (10MG AND 5MG): ORAL_TABLET | ORAL | 0 refills | Status: DC

## 2020-08-11 NOTE — Progress Notes (Signed)
ANTICOAGULATION CONSULT NOTE - Follow Up Consult  Pharmacy Consult for IV Heparin Indication: pulmonary embolus  No Known Allergies  Patient Measurements: Height: 5\' 1"  (154.9 cm) Weight: 86 kg (189 lb 11.2 oz) IBW/kg (Calculated) : 47.8 Heparin Dosing Weight: 67.6 kg   Vital Signs: Temp: 98.1 F (36.7 C) (03/17 0002) Temp Source: Oral (03/17 0002) BP: 123/68 (03/17 0002) Pulse Rate: 63 (03/17 0002)  Labs: Recent Labs    08/09/20 1041 08/09/20 1041 08/09/20 1253 08/10/20 0209 08/10/20 1136 08/10/20 1813 08/11/20 0135  HGB 13.4  --   --  12.5  --   --  12.3  HCT 42.3  --   --  38.5  --   --  38.1  PLT 173  --   --  161  --   --  162  HEPARINUNFRC  --    < >  --  0.90* 0.72* 0.59 0.85*  CREATININE 1.02*  --   --   --   --   --   --   TROPONINIHS 22*  --  22*  --   --   --   --    < > = values in this interval not displayed.    Estimated Creatinine Clearance: 39.4 mL/min (A) (by C-G formula based on SCr of 1.02 mg/dL (H)).    Assessment: 87 yr old female presented with SOB, was found to have R-sided submassive PE with R heart strain. Pharmacy was consulted to dose IV heparin; pt was not on anticoagulant PTA.  Heparin level supratherapeutic (0.85) on gtt at 1150 units/hr. No bleeding noted.  Goal of Therapy:  Heparin level 0.3-0.7 units/ml Monitor platelets by anticoagulation protocol: Yes   Plan:  Decrease heparin infusion to 1000 units/hr Will f/u 8 hr heparin level  89, PharmD, BCPS Please see amion for complete clinical pharmacist phone list 08/11/2020 3:06 AM

## 2020-08-11 NOTE — Progress Notes (Signed)
Physical Therapy Treatment Patient Details Name: Lynn Brown MRN: [id number] DOB: 12/08/1931 Today's Date: 08/11/2020    History of Present Illness 87 y.o. female presented with right-sided chest pain and shortness of breath which has been going on for about a month gradually getting worse. 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 Able to negotiate 4 stairs (had IV so couldn't go up further) and I am confident she can negotiate a flight. Pt with good SpO2 (>92% ) on RA throughout activity.   Follow Up Recommendations  Home health PT;Supervision - Intermittent     Equipment Recommendations  None recommended by PT    Recommendations for Other Services       Precautions / Restrictions Precautions Precautions: Fall    Mobility  Bed Mobility Overal bed mobility: Modified Independent             General bed mobility comments: Incr time    Transfers Overall transfer level: Needs assistance Equipment used: None Transfers: Sit to/from Stand Sit to Stand: Supervision         General transfer comment: Incr time to rise. Supervision for safety and lines  Ambulation/Gait Ambulation/Gait assistance: Supervision Gait Distance (Feet): 150 Feet (x 2) Assistive device: None;4-wheeled walker Gait Pattern/deviations: Step-through pattern;Decreased stride length;Trunk flexed Gait velocity: decr Gait velocity interpretation: 1.31 - 2.62 ft/sec, indicative of limited community ambulator General Gait Details: supervision for safety and lines. Used rollator for energy conservation for stairs   Stairs Stairs: Yes Stairs assistance: Min guard Stair Management: Two rails;Step to pattern;Forwards Number of Stairs: 4 General stair comments: Assist for  safety. Number of stairs limited by length of IV line.   Wheelchair Mobility    Modified Rankin (Stroke Patients Only)       Balance Overall balance assessment: Mild deficits observed, not formally tested                                          Cognition Arousal/Alertness: Awake/alert Behavior During Therapy: WFL for tasks assessed/performed Overall Cognitive Status: Within Functional Limits for tasks assessed                                        Exercises      General Comments General comments (skin integrity, edema, etc.): SpO2 > 92% on RA with amb      Pertinent Vitals/Pain Pain Assessment: No/denies pain    Home Living                      Prior Function            PT Goals (current goals can now be found in the care plan section) Acute Rehab PT Goals Patient Stated Goal: live to 100 Progress towards PT goals: Progressing toward goals    Frequency    Min 3X/week      PT Plan Current plan remains appropriate    Co-evaluation              AM-PAC PT "6 Clicks" Mobility   Outcome Measure  Help needed turning from your back to your side while in a flat bed without using bedrails?: None Help needed moving from lying on your back to sitting on the side of a flat bed without using bedrails?: None Help needed moving to and from a bed to a chair (including a wheelchair)?: None Help needed standing up from a chair using your arms (e.g., wheelchair or bedside chair)?: None Help needed to walk in hospital room?: None Help needed climbing 3-5 steps with a railing? : A Little 6 Click Score: 23    End of Session Equipment Utilized During Treatment: Gait belt Activity Tolerance: Patient tolerated treatment well Patient left: in chair;with call bell/phone within reach;with chair alarm set Nurse Communication: Mobility status PT Visit Diagnosis: Other abnormalities of gait and mobility (R26.89);Difficulty in  walking, not elsewhere classified (R26.2)     Time: 3154-0086 PT Time Calculation (min) (ACUTE ONLY): 20 min  Charges:  $Gait Training: 8-22 mins                     [facility] PT Acute Rehabilitation Services Pager [phone] Office [phone]    [name] [facility] 08/11/2020, 10:01 AM

## 2020-08-11 NOTE — Progress Notes (Signed)
ANTICOAGULATION CONSULT NOTE - Initial Consult  Pharmacy Consult for Eliquis Indication: PE  No Known Allergies  Patient Measurements: Height: 5\' 1"  (154.9 cm) Weight: 86 kg (189 lb 11.2 oz) IBW/kg (Calculated) : 47.8  Vital Signs: Temp: 98.2 F (36.8 C) (03/17 0759) Temp Source: Oral (03/17 0759) BP: 111/72 (03/17 0759) Pulse Rate: 74 (03/17 0759)  Labs: Recent Labs    08/09/20 1041 08/09/20 1041 08/09/20 1253 08/10/20 0209 08/10/20 1136 08/10/20 1813 08/11/20 0135  HGB 13.4  --   --  12.5  --   --  12.3  HCT 42.3  --   --  38.5  --   --  38.1  PLT 173  --   --  161  --   --  162  HEPARINUNFRC  --    < >  --  0.90* 0.72* 0.59 0.85*  CREATININE 1.02*  --   --   --   --   --   --   TROPONINIHS 22*  --  22*  --   --   --   --    < > = values in this interval not displayed.    Estimated Creatinine Clearance: 39.4 mL/min (A) (by C-G formula based on SCr of 1.02 mg/dL (H)).   Medical History: Past Medical History:  Diagnosis Date  . Allergic rhinitis   . Anxiety   . Bunion    PES PLANUS  . Cervical disc disease   . Hearing loss    HEARING AIDS, BUT DOES NOT WEAR THEM CONSISTENTLY.   08/13/20 Hypercholesteremia   . Hypertension   . Hypothyroid   . Leaky heart valve    DR. HARWANI  . Mild obesity   . Osteoarthritis of knees, bilateral   . Osteoporosis 12/2017   T score -3.3  . Venous insufficiency      Assessment: 87 yo female presented to ED with shortness of breath and right sided chest pain, gradually getting worse for a month. Found to have submassive PE with R heart strain. 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 On aspirin and plavix PTA. Was on Eliquis in the past but was stopped because of bleed risk, but benefit of PE treatment outweighs risk. Started on heparin on 3/15. Pharmacy was consulted for Eliquis for PE treatment. Hgb and Platelets WNL.    Goal of Therapy:   Therapeutic oral anticoagulation  Monitor platelets per anticoagulation protocol: Yes   Plan:  Eliquis 10mg  BID x 7 days (until 3/24) Eliquis 5mg  BID starting 3/24 pm  4/24  Pharm-D Candidate  08/11/2020,11:02 AM

## 2020-08-11 NOTE — Plan of Care (Signed)

## 2020-08-11 NOTE — Progress Notes (Signed)
PROGRESS NOTE        PATIENT DETAILS Name: Lynn Brown Age: 87 y.o. Sex: female Date of Birth: Jul 21, 1931 Admit Date: 08/09/2020 Admitting Physician Emeline General, MD [id number], Aram Beecham, MD  Brief Narrative: Patient is a 87 y.o. female PAD-s/p left SFA/popliteal artery stenting on 04/2020, HTN, HLD presented with exertional shortness of breath for approximately 1 week.  She was found to have PE and admitted to the hospitalist service.  See below for further details.    Significant events: 3/15>> admit for submassive PE  Significant studies: 3/15>> CTA chest: Extensive pulmonary emboli in the right lung 3/16>> Echo: EF 55-60%, RVSP 77 mmHg-RV systolic function mildly reduced. 3/16>> bilateral lower extremity Doppler: No DVT.  Antimicrobial therapy: None  Microbiology data: None  Procedures : None  Consults:   DVT Prophylaxis : Heparin drip apixaban (ELIQUIS) tablet 10 mg  apixaban (ELIQUIS) tablet 5 mg   Subjective: Feels better-ambulated to the bathroom earlier-hardly any shortness of breath.  Minimal pleuritic chest pain.  Assessment/Plan: Submassive PE: Remains hemodynamically stable-on room air-less exertional dyspnea this morning.  Acknowledges improvement in the pleuritic chest pain as well.  Although she does have some echo evidence of mild RV strain/pulmonary hypertension-she is hemodynamically stable-spoke with PCCM-Dr. Ellison-continue anticoagulation-no need for lytic therapy.  Since stable on IV heparin x48 hours-suspect she is okay to be transition to Eliquis.  We will watch closely on Eliquis-and if no major issues-suspect should be able to go home on 3/18.  Patient will need uninterrupted anticoagulation for at least 6 to 9 months-and will benefit from a outpatient hematology evaluation before discontinuing anticoagulation.  This is a unprovoked VTE.  HTN: BP stable-hold amlodipine-continue with low-dose metoprolol.  HLD: Continue  statin  Hypothyroidism: Continue Synthroid  PAD-s/p left SFA/popliteal artery stenting on 04/2020: Admitting MD spoke with vascular surgery-okay to stop Plavix-continue with aspirin.  Obesity: Estimated body mass index is 35.84 kg/m as calculated from the following:   Height as of this encounter: 5\' 1"  (1.549 m).   Weight as of this encounter: 86 kg.    Diet: Diet Order            Diet Heart Room service appropriate? Yes; Fluid consistency: Thin  Diet effective now                  Code Status: Full code   Family Communication: Daughter-Wilma-[phone]-updated over the phone on 3/17  Disposition Plan: Status is: Inpatient  Remains inpatient appropriate because:Inpatient level of care appropriate due to severity of illness   Dispo: The patient is from: Home              Anticipated d/c is to: Home              Patient currently is not medically stable to d/c.   Difficult to place patient No    Barriers to Discharge: Submassive PE-on IV heparin>>  Antimicrobial agents: Anti-infectives (From admission, onward)   None       Time spent: 25- minutes-Greater than 50% of this time was spent in counseling, explanation of diagnosis, planning of further management, and coordination of care.  MEDICATIONS: Scheduled Meds: . apixaban  10 mg Oral BID   Followed by  . [START ON 08/18/2020] apixaban  5 mg Oral BID  . aspirin EC  81 mg Oral Daily  .  docusate sodium  100 mg Oral BID  . levothyroxine  112 mcg Oral QAC breakfast  . lidocaine  1 patch Transdermal Q24H  . loratadine  10 mg Oral Daily  . magnesium oxide  400 mg Oral QPM  . metoprolol tartrate  12.5 mg Oral BID  . ofloxacin  1 drop Right Eye QID  . pantoprazole  40 mg Oral Daily  . prednisoLONE acetate  1 drop Right Eye QID  . rosuvastatin  20 mg Oral QPM  . spironolactone  25 mg Oral Daily   Continuous Infusions: . heparin 1,000 Units/hr (08/11/20 0609)   PRN Meds:.acetaminophen, clorazepate,  oxyCODONE   PHYSICAL EXAM: Vital signs: Vitals:   08/10/20 1941 08/11/20 0002 08/11/20 0347 08/11/20 0759  BP: 113/65 123/68 106/62 111/72  Pulse: 66 63 62 74  Resp: Temp: 98 F (36.7 C) 98.1 F (36.7 C) 98 F (36.7 C) 98.2 F (36.8 C)  TempSrc: Oral Oral Oral Oral  SpO2: 95% 94% 94% 95%  Weight:      Height:       Filed Weights   08/09/20 1711 08/09/20 2041  Weight: 85.7 kg 86 kg   Body mass index is 35.84 kg/m.   Gen Exam:Alert awake-not in any distress HEENT:atraumatic, normocephalic Chest: B/L clear to auscultation anteriorly CVS:S1S2 regular Abdomen:soft non tender, non distended Extremities:no edema Neurology: Non focal Skin: no rash  I have personally reviewed following labs and imaging studies  LABORATORY DATA: CBC: Recent Labs  Lab 08/09/20 1041 08/10/20 0209 08/11/20 0135  WBC 5.4 5.6 4.9  HGB 13.4 12.5 12.3  HCT 42.3 38.5 38.1  MCV 96.6 94.6 94.5  PLT 173 161 162    Basic Metabolic Panel: Recent Labs  Lab 08/09/20 1041  NA 138  K 4.1  CL 109  CO2 22  GLUCOSE 118*  BUN 22  CREATININE 1.02*  CALCIUM 9.1    GFR: Estimated Creatinine Clearance: 39.4 mL/min (A) (by C-G formula based on SCr of 1.02 mg/dL (H)).  Liver Function Tests: No results for input(s): AST, ALT, ALKPHOS, BILITOT, PROT, ALBUMIN in the last 168 hours. No results for input(s): LIPASE, AMYLASE in the last 168 hours. No results for input(s): AMMONIA in the last 168 hours.  Coagulation Profile: No results for input(s): INR, PROTIME in the last 168 hours.  Cardiac Enzymes: No results for input(s): CKTOTAL, CKMB, CKMBINDEX, TROPONINI in the last 168 hours.  BNP (last 3 results) No results for input(s): PROBNP in the last 8760 hours.  Lipid Profile: No results for input(s): CHOL, HDL, LDLCALC, TRIG, CHOLHDL, LDLDIRECT in the last 72 hours.  Thyroid Function Tests: No results for input(s): TSH, T4TOTAL, FREET4, T3FREE, THYROIDAB in the last 72  hours.  Anemia Panel: No results for input(s): VITAMINB12, FOLATE, FERRITIN, TIBC, IRON, RETICCTPCT in the last 72 hours.  Urine analysis:    Component Value Date/Time   COLORURINE STRAW (A) 06/01/2017 2029   APPEARANCEUR CLEAR 06/01/2017 2029   LABSPEC 1.010 06/01/2017 2029   PHURINE 6.0 06/01/2017 2029   GLUCOSEU NEGATIVE 06/01/2017 2029   HGBUR NEGATIVE 06/01/2017 2029   BILIRUBINUR NEGATIVE 06/01/2017 2029   KETONESUR NEGATIVE 06/01/2017 2029   PROTEINUR 30 (A) 06/01/2017 2029   UROBILINOGEN 0.2 01/04/2012 1450   NITRITE NEGATIVE 06/01/2017 2029   LEUKOCYTESUR MODERATE (A) 06/01/2017 2029    Sepsis Labs: Lactic Acid, Venous No results found for: LATICACIDVEN  MICROBIOLOGY: Recent Results (from the past 240 hour(s))  SARS CORONAVIRUS 2 (TAT 6-24 HRS)  Nasopharyngeal Nasopharyngeal Swab     Status: None   Collection Time: 08/09/20 12:49 PM   Specimen: Nasopharyngeal Swab  Result Value Ref Range Status   SARS Coronavirus 2 NEGATIVE NEGATIVE Final    Comment: (NOTE) SARS-CoV-2 target nucleic acids are NOT DETECTED.  The SARS-CoV-2 RNA is generally detectable in upper and lower respiratory specimens during the acute phase of infection. Negative results do not preclude SARS-CoV-2 infection, do not rule out co-infections with other pathogens, and should not be used as the sole basis for treatment or other patient management decisions. Negative results must be combined with clinical observations, patient history, and epidemiological information. The expected result is Negative.  Fact Sheet for Patients: HairSlick.no  Fact Sheet for Healthcare Providers: quierodirigir.com  This test is not yet approved or cleared by the [place] FDA and  has been authorized for detection and/or diagnosis of SARS-CoV-2 by FDA under an Emergency Use Authorization (EUA). This EUA will remain  in effect (meaning this test can be  used) for the duration of the COVID-19 declaration under Se ction 564(b)(1) of the Act, 21 U.S.C. section 360bbb-3(b)(1), unless the authorization is terminated or revoked sooner.  Performed at [facility], [address] [place], [place] [address]     RADIOLOGY STUDIES/RESULTS: DG Ribs Unilateral Right  Result Date: 08/09/2020 CLINICAL DATA:  Right rib pain and shortness of breath for 2 weeks. EXAM: RIGHT RIBS - 2 VIEW COMPARISON:  Chest radiograph of earlier today. FINDINGS: Two views of right-sided ribs demonstrate no displaced fracture. No pleural fluid or pneumothorax. Osteopenia. S shaped thoracic spine curvature. IMPRESSION: No acute osseous abnormality. Electronically Signed   By: Jeronimo Greaves M.D.   On: 08/09/2020 12:20   CT Angio Chest PE W and/or Wo Contrast  Result Date: 08/09/2020 CLINICAL DATA:  Chest pain, shortness of breath EXAM: CT ANGIOGRAPHY CHEST WITH CONTRAST TECHNIQUE: Multidetector CT imaging of the chest was performed using the standard protocol during bolus administration of intravenous contrast. Multiplanar CT image reconstructions and MIPs were obtained to evaluate the vascular anatomy. CONTRAST:  74mL OMNIPAQUE IOHEXOL 350 MG/ML SOLN COMPARISON:  Chest x-ray today FINDINGS: Cardiovascular: Extensive pulmonary embolus noted in the right pulmonary arteries including right main pulmonary artery with occlusion of the right lower lobe pulmonary artery. Pulmonary emboli also noted in the right upper lobe. No pulmonary emboli on the left. Evidence of right heart strain with an RV/LV ratio of 1.22. Reflux of contrast into the IVC and hepatic veins suggests right heart dysfunction. Coronary artery and aortic atherosclerosis. No aortic aneurysm. Mediastinum/Nodes: No mediastinal, hilar, or axillary adenopathy. Trachea and esophagus are unremarkable. Thyroid unremarkable. Lungs/Pleura: Atelectasis noted medially and posteriorly in the left lung base. No effusions or  confluent opacities otherwise. Upper Abdomen: Scattered hypodensities throughout the liver cannot be characterized on this study, favor cysts. Musculoskeletal: Chest wall soft tissues are unremarkable. No acute bony abnormality. Review of the MIP images confirms the above findings. IMPRESSION: Extensive pulmonary emboli within the right lung. CT evidence of right heart strain (RV/LV Ratio = 1.22) consistent with at least submassive (intermediate risk) PE. The presence of right heart strain has been associated with an increased risk of morbidity and mortality. Coronary artery disease. Atelectasis in the left lower lobe. Aortic Atherosclerosis (ICD10-I70.0). These results were called by telephone at the time of interpretation on 08/09/2020 at 1:56 pm to provider [facility] , who verbally acknowledged these results. Electronically Signed   By: Charlett Nose M.D.   On:  08/09/2020 13:57   ECHOCARDIOGRAM COMPLETE  Result Date: 08/10/2020    ECHOCARDIOGRAM REPORT   Patient Name:   Lynn Brown Date of Exam: 08/10/2020 Medical Rec #:  [id number]      Height:       61.0 in Accession #:    [id number]     Weight:       189.7 lb Date of Birth:  1931/10/13       BSA:          1.847 m Patient Age:    88 years       BP:           119/67 mmHg Patient Gender: F              HR:           68 bpm. Exam Location:  Inpatient Procedure: 2D Echo, 3D Echo, Cardiac Doppler, Color Doppler and Strain Analysis Indications:    Pulmonary Embolus  History:        Patient has prior history of Echocardiogram examinations, most                 recent 09/25/2017. Signs/Symptoms:Murmur.  Sonographer:    Neomia Dear RDCS Referring Phys: [id number] Emeline General IMPRESSIONS  1. Findings consistent with acute PE and RV volume/pressure overload (cor pulmonale). RVSP severely elevated ~77 mmHG.  2. Left ventricular ejection fraction, by estimation, is 55 to 60%. The left ventricle has normal function. The left ventricle has no regional wall motion  abnormalities. There is mild concentric left ventricular hypertrophy. Left ventricular diastolic parameters are consistent with Grade I diastolic dysfunction (impaired relaxation). There is the interventricular septum is flattened in systole and diastole, consistent with right ventricular pressure and volume overload. The average left ventricular global longitudinal strain is -13.4 %. The global longitudinal strain is abnormal.  3. Right ventricular systolic function is mildly reduced. The right ventricular size is moderately enlarged. There is severely elevated pulmonary artery systolic pressure. The estimated right ventricular systolic pressure is 76.6 mmHg.  4. Right atrial size was mild to moderately dilated.  5. The mitral valve is grossly normal. Trivial mitral valve regurgitation. No evidence of mitral stenosis.  6. Tricuspid valve regurgitation is moderate.  7. The aortic valve is tricuspid. Aortic valve regurgitation is trivial. No aortic stenosis is present.  8. The inferior vena cava is normal in size with greater than 50% respiratory variability, suggesting right atrial pressure of 3 mmHg. Comparison(s): Changes from prior study are noted. Acute RV failure/cor pulmonale is present. Conclusion(s)/Recommendation(s): Findings consistent with Cor Pulmonale. FINDINGS  Left Ventricle: Left ventricular ejection fraction, by estimation, is 55 to 60%. The left ventricle has normal function. The left ventricle has no regional wall motion abnormalities. The average left ventricular global longitudinal strain is -13.4 %. The global longitudinal strain is abnormal. 3D left ventricular ejection fraction analysis performed but not reported based on interpreter judgement due to suboptimal quality. The left ventricular internal cavity size was normal in size. There is mild concentric left ventricular hypertrophy. The interventricular septum is flattened in systole and diastole, consistent with right ventricular pressure  and volume overload. Left ventricular diastolic parameters are consistent with Grade I diastolic dysfunction (impaired relaxation). Right Ventricle: The right ventricular size is moderately enlarged. No increase in right ventricular wall thickness. Right ventricular systolic function is mildly reduced. There is severely elevated pulmonary artery systolic pressure. The tricuspid regurgitant velocity is 4.29 m/s, and with an assumed right atrial pressure  of 3 mmHg, the estimated right ventricular systolic pressure is 76.6 mmHg. Left Atrium: Left atrial size was normal in size. Right Atrium: Right atrial size was mild to moderately dilated. Pericardium: Trivial pericardial effusion is present. Mitral Valve: The mitral valve is grossly normal. Trivial mitral valve regurgitation. No evidence of mitral valve stenosis. Tricuspid Valve: The tricuspid valve is grossly normal. Tricuspid valve regurgitation is moderate . No evidence of tricuspid stenosis. Aortic Valve: The aortic valve is tricuspid. Aortic valve regurgitation is trivial. Aortic regurgitation PHT measures 597 msec. No aortic stenosis is present. Aortic valve mean gradient measures 3.0 mmHg. Aortic valve peak gradient measures 5.9 mmHg. Aortic valve area, by VTI measures 2.42 cm. Pulmonic Valve: The pulmonic valve was grossly normal. Pulmonic valve regurgitation is mild. No evidence of pulmonic stenosis. Aorta: The aortic root and ascending aorta are structurally normal, with no evidence of dilitation. Venous: The right upper pulmonary vein is normal. The inferior vena cava is normal in size with greater than 50% respiratory variability, suggesting right atrial pressure of 3 mmHg. IAS/Shunts: There is left bowing of the interatrial septum, suggestive of elevated right atrial pressure. The atrial septum is grossly normal.  LEFT VENTRICLE PLAX 2D LVIDd:         3.70 cm     Diastology LVIDs:         2.50 cm     LV e' medial:    2.89 cm/s LV PW:         1.20 cm      LV E/e' medial:  19.7 LV IVS:        1.31 cm     LV e' lateral:   4.73 cm/s LVOT diam:     2.00 cm     LV E/e' lateral: 12.0 LV SV:         58 LV SV Index:   31          2D Longitudinal Strain LVOT Area:     3.14 cm    2D Strain GLS Avg:     -13.4 %  LV Volumes (MOD) LV vol d, MOD A2C: 36.1 ml 3D Volume EF: LV vol d, MOD A4C: 50.7 ml 3D EF:        82 % LV vol s, MOD A2C: 15.7 ml LV EDV:       235 ml LV vol s, MOD A4C: 28.7 ml LV ESV:       43 ml LV SV MOD A2C:     20.4 ml LV SV:        192 ml LV SV MOD A4C:     50.7 ml LV SV MOD BP:      24.2 ml RIGHT VENTRICLE TAPSE (M-mode): 1.5 cm  PULMONARY VEINS                         A Reversal Duration: 106.00 msec                         A Reversal Velocity: 24.40 cm/s                         Diastolic Velocity:  25.30 cm/s                         S/D Velocity:        1.90  Systolic Velocity:   47.10 cm/s LEFT ATRIUM             Index       RIGHT ATRIUM           Index LA diam:        3.50 cm 1.90 cm/m  RA Area:     29.80 cm LA Vol (A2C):   63.0 ml 34.11 ml/m RA Volume:   117.00 ml 63.35 ml/m LA Vol (A4C):   40.7 ml 22.04 ml/m LA Biplane Vol: 52.3 ml 28.32 ml/m  AORTIC VALVE                   PULMONIC VALVE AV Area (Vmax):    2.65 cm    PV Vmax:       0.80 m/s AV Area (Vmean):   2.44 cm    PV Vmean:      61.000 cm/s AV Area (VTI):     2.42 cm    PV VTI:        0.215 m AV Vmax:           121.00 cm/s PV Peak grad:  2.6 mmHg AV Vmean:          83.300 cm/s PV Mean grad:  2.0 mmHg AV VTI:            0.239 m AV Peak Grad:      5.9 mmHg AV Mean Grad:      3.0 mmHg LVOT Vmax:         102.00 cm/s LVOT Vmean:        64.600 cm/s LVOT VTI:          0.184 m LVOT/AV VTI ratio: 0.77 AI PHT:            597 msec  AORTA Ao Root diam: 3.30 cm Ao Asc diam:  3.20 cm MITRAL VALVE                TRICUSPID VALVE MV Area (PHT): 2.66 cm     TR Peak grad:   73.6 mmHg MV Decel Time: 285 msec     TR Vmax:        429.00 cm/s MV E velocity: 56.80 cm/s MV A velocity:  101.00 cm/s  SHUNTS MV E/A ratio:  0.56         Systemic VTI:  0.18 m                             Systemic Diam: 2.00 cm Lennie Odor MD Electronically signed by Lennie Odor MD Signature Date/Time: 08/10/2020/12:04:30 PM    Final    VAS [place] LOWER EXTREMITY VENOUS (DVT)  Result Date: 08/10/2020  Lower Venous DVT Study Indications: Swelling, and pulmonary embolism.  Risk Factors: None identified. Anticoagulation: Heparin. Comparison Study: No previous Performing Technologist: Clint Guy RVT  Examination Guidelines: A complete evaluation includes B-mode imaging, spectral Doppler, color Doppler, and power Doppler as needed of all accessible portions of each vessel. Bilateral testing is considered an integral part of a complete examination. Limited examinations for reoccurring indications may be performed as noted. The reflux portion of the exam is performed with the patient in reverse Trendelenburg.  +---------+---------------+---------+-----------+----------+--------------+ RIGHT    CompressibilityPhasicitySpontaneityPropertiesThrombus Aging +---------+---------------+---------+-----------+----------+--------------+ CFV      Full           Yes      Yes                                 +---------+---------------+---------+-----------+----------+--------------+  SFJ      Full                                                        +---------+---------------+---------+-----------+----------+--------------+ FV Prox  Full                                                        +---------+---------------+---------+-----------+----------+--------------+ FV Mid   Full                                                        +---------+---------------+---------+-----------+----------+--------------+ FV DistalFull                                                        +---------+---------------+---------+-----------+----------+--------------+ PFV      Full                                                         +---------+---------------+---------+-----------+----------+--------------+ POP      Full           Yes      Yes                                 +---------+---------------+---------+-----------+----------+--------------+ PTV      Full                                                        +---------+---------------+---------+-----------+----------+--------------+ PERO     Full                                                        +---------+---------------+---------+-----------+----------+--------------+   +---------+---------------+---------+-----------+----------+--------------+ LEFT     CompressibilityPhasicitySpontaneityPropertiesThrombus Aging +---------+---------------+---------+-----------+----------+--------------+ CFV      Full           Yes      Yes                                 +---------+---------------+---------+-----------+----------+--------------+ SFJ      Full                                                        +---------+---------------+---------+-----------+----------+--------------+  FV Prox  Full                                                        +---------+---------------+---------+-----------+----------+--------------+ FV Mid   Full                                                        +---------+---------------+---------+-----------+----------+--------------+ FV DistalFull                                                        +---------+---------------+---------+-----------+----------+--------------+ PFV      Full                                                        +---------+---------------+---------+-----------+----------+--------------+ POP      Full           Yes      Yes                                 +---------+---------------+---------+-----------+----------+--------------+ PTV      Full                                                         +---------+---------------+---------+-----------+----------+--------------+ PERO     Full                                                        +---------+---------------+---------+-----------+----------+--------------+  Summary: BILATERAL: - No evidence of deep vein thrombosis seen in the lower extremities, bilaterally. - No evidence of superficial venous thrombosis in the lower extremities, bilaterally. - RIGHT: - A cystic structure is found in the popliteal fossa.  LEFT: - No cystic structure found in the popliteal fossa.  *See table(s) above for measurements and observations.    Preliminary      LOS: 2 days   Jeoffrey MassedShanker Sharone Picchi, MD  [facility]    To contact the attending provider between 7A-7P or the covering provider during after hours 7P-7A, please log into the web site [email or web address] and access using universal [place] password for that web site. If you do not have the password, please call the hospital operator.  08/11/2020, 12:02 PM

## 2020-08-12 NOTE — Discharge Summary (Addendum)
PATIENT DETAILS Name: Lynn Brown Age: 87 y.o. Sex: female Date of Birth: [birth year] MRN: [id number]. Admitting Physician: Emeline General, MD GUY:[id number], Aram Beecham, MD  Admit Date: 08/09/2020 Discharge date: 08/12/2020  Recommendations for Outpatient Follow-up:  1. Follow up with PCP in 1-2 weeks 2. Please obtain CMP/CBC in one week 3. Please ensure outpatient follow-up with hematology-unprovoked PE 4. Amlodipine on hold-resume if able over the next few days/weeks.  Admitted From:  Home  Disposition: Home with home health services   Home Health: Yes  Equipment/Devices: None  Discharge Condition: Stable  CODE STATUS: FULL CODE  Diet recommendation:  Diet Order            Diet - low sodium heart healthy           Diet Heart Room service appropriate? Yes; Fluid consistency: Thin  Diet effective now                  Brief Narrative: Patient is a 87 y.o. female PAD-s/p left SFA/popliteal artery stenting on 04/2020, HTN, HLD presented with exertional shortness of breath for approximately 1 week.  She was found to have PE and admitted to the hospitalist service.  See below for further details.    Significant events: 3/15>> admit for submassive PE  Significant studies: 3/15>> CTA chest: Extensive pulmonary emboli in the right lung 3/16>> Echo: EF 55-60%, RVSP 77 mmHg-RV systolic function mildly reduced. 3/16>> bilateral lower extremity Doppler: No DVT.  Antimicrobial therapy: None  Microbiology data: None  Procedures : None  Consults: PCCM  Brief Hospital Course: Submassive PE:  Significantly better-now able to ambulate with physical therapy-in fact went up a few steps without any desaturation.  Claims that she is now able to move around in the room-she was not able to do this when she first presented to the hospital.  She was treated with IV heparin-and then transition to oral Eliquis on 3/17-she remains stable overnight-plans are for discharge  home to continue oral anticoagulation in the outpatient setting.  Her VTE is unprovoked-she ideally needs indefinite anticoagulation-have advised that she obtain a outpatient hematology evaluation-if discontinuing anticoagulation was considered down the line.  Note-she had echo evidence of mild RV strain/severe pulmonary hypertension-however since she was hemodynamically stable-she was not considered a candidate for lytic therapy.  She was briefly seen by PCCM-plans are to repeat echocardiogram in the outpatient setting in approximately 1 month time.  HTN: BP stable-with just metoprolol-continue to hold amlodipine.  Follow with PCP for further optimization-reassess if amlodipine can be started at next visit.  HLD: Continue statin  Hypothyroidism: Continue Synthroid  PAD-s/p left SFA/popliteal artery stenting on 04/2020: Admitting MD spoke with vascular surgery-okay to stop Plavix-continue with aspirin.  Obesity: Estimated body mass index is 35.84 kg/m as calculated from the following:   Height as of this encounter: 5\' 1"  (1.549 m).   Weight as of this encounter: 86 kg.   Obesity: Estimated body mass index is 35.84 kg/m as calculated from the following:   Height as of this encounter: 5\' 1"  (1.549 m).   Weight as of this encounter: 86 kg.    Discharge Diagnoses:  Active Problems:   Pulmonary emboli [facility])   Discharge Instructions:  Activity:  As tolerated with Full fall precautions use walker/cane & assistance as needed   Discharge Instructions    Ambulatory referral to Hematology   Complete by: As directed    Submassive PE-unprovoked-needs outpatient hematology evaluation.   Call MD for:  difficulty  breathing, headache or visual disturbances   Complete by: As directed    Call MD for:  persistant dizziness or light-headedness   Complete by: As directed    Call MD for:  severe uncontrolled pain   Complete by: As directed    Diet - low sodium heart healthy   Complete by:  As directed    Discharge instructions   Complete by: As directed    1.)Continue anticoagulation with Eliquis-he will likely need indefinite therapy-unless told otherwise by a primary care practitioner or hematology.  2.)  Your blood pressure is well controlled with metoprolol and Aldactone-amlodipine remains on hold at the time of discharge.  Please discuss with your primary care practitioner if this can be resumed at your next visit.  3.)  Follow-up at the lung doctor's office will be arranged-you should be getting a call soon-if you do not hear from them-please give them a call.  4.)  Follow-up with blood doctor's office will be arranged-you should be getting a call soon (electronic referral sent)-if you do not hear from them-please give them a call.   Follow with Primary MD  Laurann Montana, MD in 1-2 weeks  Please get a complete blood count and chemistry panel checked by your Primary MD at your next visit, and again as instructed by your Primary MD.  Get Medicines reviewed and adjusted: Please take all your medications with you for your next visit with your Primary MD  Laboratory/radiological data: Please request your Primary MD to go over all hospital tests and procedure/radiological results at the follow up, please ask your Primary MD to get all Hospital records sent to his/her office.  In some cases, they will be blood work, cultures and biopsy results pending at the time of your discharge. Please request that your primary care M.D. follows up on these results.  Also Note the following: If you experience worsening of your admission symptoms, develop shortness of breath, life threatening emergency, suicidal or homicidal thoughts you must seek medical attention immediately by calling 911 or calling your MD immediately  if symptoms less severe.  You must read complete instructions/literature along with all the possible adverse reactions/side effects for all the Medicines you take and  that have been prescribed to you. Take any new Medicines after you have completely understood and accpet all the possible adverse reactions/side effects.   Do not drive when taking Pain medications or sleeping medications (Benzodaizepines)  Do not take more than prescribed Pain, Sleep and Anxiety Medications. It is not advisable to combine anxiety,sleep and pain medications without talking with your primary care practitioner  Special Instructions: If you have smoked or chewed Tobacco  in the last 2 yrs please stop smoking, stop any regular Alcohol  and or any Recreational drug use.  Wear Seat belts while driving.  Please note: You were cared for by a hospitalist during your hospital stay. Once you are discharged, your primary care physician will handle any further medical issues. Please note that NO REFILLS for any discharge medications will be authorized once you are discharged, as it is imperative that you return to your primary care physician (or establish a relationship with a primary care physician if you do not have one) for your post hospital discharge needs so that they can reassess your need for medications and monitor your lab values.   Increase activity slowly   Complete by: As directed      Allergies as of 08/12/2020   No Known Allergies  Medication List    STOP taking these medications   amLODipine 2.5 MG tablet Commonly known as: NORVASC   clopidogrel 75 MG tablet Commonly known as: Plavix     TAKE these medications   acetaminophen 500 MG tablet Commonly known as: TYLENOL Take 500-1,000 mg by mouth as needed (for pain).   Apixaban Starter Pack (10mg  and 5mg ) Commonly known as: ELIQUIS STARTER PACK Take as directed on package: start with two-5mg  tablets twice daily for 7 days. On day 8, switch to one-5mg  tablet twice daily.   ascorbic acid 500 MG tablet Commonly known as: VITAMIN C Take 500 mg by mouth in the morning and at bedtime.   aspirin EC 81 MG  tablet Take 1 tablet (81 mg total) by mouth daily. Swallow whole.   Calcium Citrate-Vitamin D 315-250 MG-UNIT Tabs Take 1 tablet by mouth daily.   cetirizine 10 MG tablet Commonly known as: ZYRTEC Take 10 mg by mouth daily as needed (allergies.).   clorazepate 7.5 MG tablet Commonly known as: TRANXENE Take 7.5 mg by mouth daily as needed for anxiety.   CoQ10 50 MG Caps Take 50 mg by mouth daily at 2 PM.   levothyroxine 112 MCG tablet Commonly known as: SYNTHROID Take 112 mcg by mouth every morning.   magnesium oxide 400 MG tablet Commonly known as: MAG-OX Take 400 mg by mouth every evening.   metoprolol tartrate 25 MG tablet Commonly known as: LOPRESSOR Take 0.5 tablets (12.5 mg total) by mouth 2 (two) times daily.   nitroGLYCERIN 0.4 MG SL tablet Commonly known as: NITROSTAT Place 1 tablet (0.4 mg total) under the tongue every 5 (five) minutes as needed for chest pain. What changed: when to take this   ofloxacin 0.3 % ophthalmic solution Commonly known as: OCUFLOX Place 1 drop into the right eye 4 (four) times daily for 21 days.   prednisoLONE acetate 1 % ophthalmic suspension Commonly known as: PRED FORTE Place 1 drop into the right eye 4 (four) times daily for 21 days.   rosuvastatin 20 MG tablet Commonly known as: CRESTOR Take 20 mg by mouth every evening.   spironolactone 25 MG tablet Commonly known as: ALDACTONE TAKE 1 TABLET BY MOUTH  DAILY   Vitamin D 50 MCG (2000 UT) tablet Take 2,000 Units by mouth daily.       Follow-up Information    , MD Follow up in 1 month(s).   Specialty: Pulmonary Disease Why: pulmonary embolism, plan to order repeat echo Contact information: [address] [place] [address] [place] [phone]        [id number], MD. Schedule an appointment as soon as possible for a visit in 1 week(s).   Specialty: Family Medicine Contact information: [address] [place] [address]  [place] [phone]        [id number], MD Follow up in 1 month(s).   Specialty: Cardiology Contact information: [address] [place] [address] [place] [phone]        [facility] Follow up.   Specialty: Oncology Why: Office will call you with a follow-up appointment-if you do not hear from them-please give them a call. Contact information: [address] [phone] [facility] mc [place] [id number] [place] [phone]             No Known Allergies    Other Procedures/Studies: DG Chest 2 View  Result Date: 08/09/2020 CLINICAL DATA:  Shortness of breath for 2  weeks. Right-sided rib pain. EXAM: CHEST - 2 VIEW COMPARISON:  03/09/2018. FINDINGS: Lungs are suboptimally inflated. Head aortic atherosclerosis. Normal heart size. Asymmetric hazy opacities within the left midlung and left lower lobe are noted. The right hilum appears asymmetrically enlarged and increased in size compared with previous imaging. Multi level degenerative disc disease identified within the thoracic spine. IMPRESSION: 1. Asymmetric hazy opacities within the left midlung and left lower lobe concerning for pneumonia. 2. Asymmetric enlargement of the right hilum. This is nonspecific and may reflect AP technique and hypoinflation. Recommend further evaluation with nonemergent contrast-enhanced CT of the chest. Electronically Signed   By: Signa Kell M.D.   On: 08/09/2020 11:18   DG Ribs Unilateral Right  Result Date: 08/09/2020 CLINICAL DATA:  Right rib pain and shortness of breath for 2 weeks. EXAM: RIGHT RIBS - 2 VIEW COMPARISON:  Chest radiograph of earlier today. FINDINGS: Two views of right-sided ribs demonstrate no displaced fracture. No pleural fluid or pneumothorax. Osteopenia. S shaped thoracic spine curvature. IMPRESSION: No acute osseous abnormality. Electronically Signed   By: Jeronimo Greaves M.D.   On: 08/09/2020 12:20   CT Angio Chest PE W  and/or Wo Contrast  Result Date: 08/09/2020 CLINICAL DATA:  Chest pain, shortness of breath EXAM: CT ANGIOGRAPHY CHEST WITH CONTRAST TECHNIQUE: Multidetector CT imaging of the chest was performed using the standard protocol during bolus administration of intravenous contrast. Multiplanar CT image reconstructions and MIPs were obtained to evaluate the vascular anatomy. CONTRAST:  74mL OMNIPAQUE IOHEXOL 350 MG/ML SOLN COMPARISON:  Chest x-ray today FINDINGS: Cardiovascular: Extensive pulmonary embolus noted in the right pulmonary arteries including right main pulmonary artery with occlusion of the right lower lobe pulmonary artery. Pulmonary emboli also noted in the right upper lobe. No pulmonary emboli on the left. Evidence of right heart strain with an RV/LV ratio of 1.22. Reflux of contrast into the IVC and hepatic veins suggests right heart dysfunction. Coronary artery and aortic atherosclerosis. No aortic aneurysm. Mediastinum/Nodes: No mediastinal, hilar, or axillary adenopathy. Trachea and esophagus are unremarkable. Thyroid unremarkable. Lungs/Pleura: Atelectasis noted medially and posteriorly in the left lung base. No effusions or confluent opacities otherwise. Upper Abdomen: Scattered hypodensities throughout the liver cannot be characterized on this study, favor cysts. Musculoskeletal: Chest wall soft tissues are unremarkable. No acute bony abnormality. Review of the MIP images confirms the above findings. IMPRESSION: Extensive pulmonary emboli within the right lung. CT evidence of right heart strain (RV/LV Ratio = 1.22) consistent with at least submassive (intermediate risk) PE. The presence of right heart strain has been associated with an increased risk of morbidity and mortality. Coronary artery disease. Atelectasis in the left lower lobe. Aortic Atherosclerosis (ICD10-I70.0). These results were called by telephone at the time of interpretation on 08/09/2020 at 1:56 pm to provider [facility] , who  verbally acknowledged these results. Electronically Signed   By: Charlett Nose M.D.   On: 08/09/2020 13:57   ECHOCARDIOGRAM COMPLETE  Result Date: 08/10/2020    ECHOCARDIOGRAM REPORT   Patient Name:   Lynn Brown Date of Exam: 08/10/2020 Medical Rec #:  [id number]      Height:       61.0 in Accession #:    [id number]     Weight:       189.7 lb Date of Birth:  [birth year]       BSA:          1.847 m Patient Age:    58 years  BP:           119/67 mmHg Patient Gender: F              HR:           68 bpm. Exam Location:  Inpatient Procedure: 2D Echo, 3D Echo, Cardiac Doppler, Color Doppler and Strain Analysis Indications:    Pulmonary Embolus  History:        Patient has prior history of Echocardiogram examinations, most                 recent 09/25/2017. Signs/Symptoms:Murmur.  Sonographer:    Neomia Dear RDCS Referring Phys: [id number] Emeline General IMPRESSIONS  1. Findings consistent with acute PE and RV volume/pressure overload (cor pulmonale). RVSP severely elevated ~77 mmHG.  2. Left ventricular ejection fraction, by estimation, is 55 to 60%. The left ventricle has normal function. The left ventricle has no regional wall motion abnormalities. There is mild concentric left ventricular hypertrophy. Left ventricular diastolic parameters are consistent with Grade I diastolic dysfunction (impaired relaxation). There is the interventricular septum is flattened in systole and diastole, consistent with right ventricular pressure and volume overload. The average left ventricular global longitudinal strain is -13.4 %. The global longitudinal strain is abnormal.  3. Right ventricular systolic function is mildly reduced. The right ventricular size is moderately enlarged. There is severely elevated pulmonary artery systolic pressure. The estimated right ventricular systolic pressure is 76.6 mmHg.  4. Right atrial size was mild to moderately dilated.  5. The mitral valve is grossly normal. Trivial mitral valve regurgitation.  No evidence of mitral stenosis.  6. Tricuspid valve regurgitation is moderate.  7. The aortic valve is tricuspid. Aortic valve regurgitation is trivial. No aortic stenosis is present.  8. The inferior vena cava is normal in size with greater than 50% respiratory variability, suggesting right atrial pressure of 3 mmHg. Comparison(s): Changes from prior study are noted. Acute RV failure/cor pulmonale is present. Conclusion(s)/Recommendation(s): Findings consistent with Cor Pulmonale. FINDINGS  Left Ventricle: Left ventricular ejection fraction, by estimation, is 55 to 60%. The left ventricle has normal function. The left ventricle has no regional wall motion abnormalities. The average left ventricular global longitudinal strain is -13.4 %. The global longitudinal strain is abnormal. 3D left ventricular ejection fraction analysis performed but not reported based on interpreter judgement due to suboptimal quality. The left ventricular internal cavity size was normal in size. There is mild concentric left ventricular hypertrophy. The interventricular septum is flattened in systole and diastole, consistent with right ventricular pressure and volume overload. Left ventricular diastolic parameters are consistent with Grade I diastolic dysfunction (impaired relaxation). Right Ventricle: The right ventricular size is moderately enlarged. No increase in right ventricular wall thickness. Right ventricular systolic function is mildly reduced. There is severely elevated pulmonary artery systolic pressure. The tricuspid regurgitant velocity is 4.29 m/s, and with an assumed right atrial pressure of 3 mmHg, the estimated right ventricular systolic pressure is 76.6 mmHg. Left Atrium: Left atrial size was normal in size. Right Atrium: Right atrial size was mild to moderately dilated. Pericardium: Trivial pericardial effusion is present. Mitral Valve: The mitral valve is grossly normal. Trivial mitral valve regurgitation. No evidence  of mitral valve stenosis. Tricuspid Valve: The tricuspid valve is grossly normal. Tricuspid valve regurgitation is moderate . No evidence of tricuspid stenosis. Aortic Valve: The aortic valve is tricuspid. Aortic valve regurgitation is trivial. Aortic regurgitation PHT measures 597 msec. No aortic stenosis is present. Aortic valve mean gradient measures  3.0 mmHg. Aortic valve peak gradient measures 5.9 mmHg. Aortic valve area, by VTI measures 2.42 cm. Pulmonic Valve: The pulmonic valve was grossly normal. Pulmonic valve regurgitation is mild. No evidence of pulmonic stenosis. Aorta: The aortic root and ascending aorta are structurally normal, with no evidence of dilitation. Venous: The right upper pulmonary vein is normal. The inferior vena cava is normal in size with greater than 50% respiratory variability, suggesting right atrial pressure of 3 mmHg. IAS/Shunts: There is left bowing of the interatrial septum, suggestive of elevated right atrial pressure. The atrial septum is grossly normal.  LEFT VENTRICLE PLAX 2D LVIDd:         3.70 cm     Diastology LVIDs:         2.50 cm     LV e' medial:    2.89 cm/s LV PW:         1.20 cm     LV E/e' medial:  19.7 LV IVS:        1.31 cm     LV e' lateral:   4.73 cm/s LVOT diam:     2.00 cm     LV E/e' lateral: 12.0 LV SV:         58 LV SV Index:   31          2D Longitudinal Strain LVOT Area:     3.14 cm    2D Strain GLS Avg:     -13.4 %  LV Volumes (MOD) LV vol d, MOD A2C: 36.1 ml 3D Volume EF: LV vol d, MOD A4C: 50.7 ml 3D EF:        82 % LV vol s, MOD A2C: 15.7 ml LV EDV:       235 ml LV vol s, MOD A4C: 28.7 ml LV ESV:       43 ml LV SV MOD A2C:     20.4 ml LV SV:        192 ml LV SV MOD A4C:     50.7 ml LV SV MOD BP:      24.2 ml RIGHT VENTRICLE TAPSE (M-mode): 1.5 cm  PULMONARY VEINS                         A Reversal Duration: 106.00 msec                         A Reversal Velocity: 24.40 cm/s                         Diastolic Velocity:  25.30 cm/s                          S/D Velocity:        1.90                         Systolic Velocity:   47.10 cm/s LEFT ATRIUM             Index       RIGHT ATRIUM           Index LA diam:        3.50 cm 1.90 cm/m  RA Area:     29.80 cm LA Vol (A2C):   63.0 ml 34.11 ml/m RA Volume:   117.00 ml 63.35 ml/m LA Vol (A4C):  40.7 ml 22.04 ml/m LA Biplane Vol: 52.3 ml 28.32 ml/m  AORTIC VALVE                   PULMONIC VALVE AV Area (Vmax):    2.65 cm    PV Vmax:       0.80 m/s AV Area (Vmean):   2.44 cm    PV Vmean:      61.000 cm/s AV Area (VTI):     2.42 cm    PV VTI:        0.215 m AV Vmax:           121.00 cm/s PV Peak grad:  2.6 mmHg AV Vmean:          83.300 cm/s PV Mean grad:  2.0 mmHg AV VTI:            0.239 m AV Peak Grad:      5.9 mmHg AV Mean Grad:      3.0 mmHg LVOT Vmax:         102.00 cm/s LVOT Vmean:        64.600 cm/s LVOT VTI:          0.184 m LVOT/AV VTI ratio: 0.77 AI PHT:            597 msec  AORTA Ao Root diam: 3.30 cm Ao Asc diam:  3.20 cm MITRAL VALVE                TRICUSPID VALVE MV Area (PHT): 2.66 cm     TR Peak grad:   73.6 mmHg MV Decel Time: 285 msec     TR Vmax:        429.00 cm/s MV E velocity: 56.80 cm/s MV A velocity: 101.00 cm/s  SHUNTS MV E/A ratio:  0.56         Systemic VTI:  0.18 m                             Systemic Diam: 2.00 cm Lennie Odor MD Electronically signed by Lennie Odor MD Signature Date/Time: 08/10/2020/12:04:30 PM    Final    OCT, Retina - OU - Both Eyes  Result Date: 07/18/2020 Right Eye Quality was good. Scan locations included subfoveal. Central Foveal Thickness: 297. Findings include vitreomacular adhesion , cystoid macular edema. Left Eye Quality was good. Scan locations included subfoveal. Central Foveal Thickness: 272. Progression has been stable. Findings include vitreomacular adhesion . 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  May consider vitrectomy with release of this so as to prevent progression of vision  loss but also a chance to recover acuity in the right eye Left eye with more focal vitreal macular adhesion yet with no visual acuity impact would observe  VAS [place] LOWER EXTREMITY VENOUS (DVT)  Result Date: 08/11/2020  Lower Venous DVT Study Indications: Swelling, and pulmonary embolism.  Risk Factors: None identified. Anticoagulation: Heparin. Comparison Study: No previous Performing Technologist: Clint Guy RVT  Examination Guidelines: A complete evaluation includes B-mode imaging, spectral Doppler, color Doppler, and power Doppler as needed of all accessible portions of each vessel. Bilateral testing is considered an integral part of a complete examination. Limited examinations for reoccurring indications may be performed as noted. The reflux portion of the exam is performed with the patient in reverse Trendelenburg.  +---------+---------------+---------+-----------+----------+--------------+ RIGHT    CompressibilityPhasicitySpontaneityPropertiesThrombus Aging +---------+---------------+---------+-----------+----------+--------------+ CFV  Full           Yes      Yes                                 +---------+---------------+---------+-----------+----------+--------------+ SFJ      Full                                                        +---------+---------------+---------+-----------+----------+--------------+ FV Prox  Full                                                        +---------+---------------+---------+-----------+----------+--------------+ FV Mid   Full                                                        +---------+---------------+---------+-----------+----------+--------------+ FV DistalFull                                                        +---------+---------------+---------+-----------+----------+--------------+ PFV      Full                                                         +---------+---------------+---------+-----------+----------+--------------+ POP      Full           Yes      Yes                                 +---------+---------------+---------+-----------+----------+--------------+ PTV      Full                                                        +---------+---------------+---------+-----------+----------+--------------+ PERO     Full                                                        +---------+---------------+---------+-----------+----------+--------------+   +---------+---------------+---------+-----------+----------+--------------+ LEFT     CompressibilityPhasicitySpontaneityPropertiesThrombus Aging +---------+---------------+---------+-----------+----------+--------------+ CFV      Full           Yes      Yes                                 +---------+---------------+---------+-----------+----------+--------------+  SFJ      Full                                                        +---------+---------------+---------+-----------+----------+--------------+ FV Prox  Full                                                        +---------+---------------+---------+-----------+----------+--------------+ FV Mid   Full                                                        +---------+---------------+---------+-----------+----------+--------------+ FV DistalFull                                                        +---------+---------------+---------+-----------+----------+--------------+ PFV      Full                                                        +---------+---------------+---------+-----------+----------+--------------+ POP      Full           Yes      Yes                                 +---------+---------------+---------+-----------+----------+--------------+ PTV      Full                                                         +---------+---------------+---------+-----------+----------+--------------+ PERO     Full                                                        +---------+---------------+---------+-----------+----------+--------------+  Summary: BILATERAL: - No evidence of deep vein thrombosis seen in the lower extremities, bilaterally. - No evidence of superficial venous thrombosis in the lower extremities, bilaterally. - RIGHT: - A cystic structure is found in the popliteal fossa.  LEFT: - No cystic structure found in the popliteal fossa.  *See table(s) above for measurements and observations. Electronically signed by Lemar Livings MD on 08/11/2020 at 5:59:23 PM.    Final      TODAY-DAY OF DISCHARGE:  Subjective:   Lynn Brown today has no headache,no chest abdominal pain,no new weakness tingling or numbness, feels much better wants to go home today.   Objective:  Blood pressure 117/64, pulse 60, temperature 97.8 F (36.6 C), temperature source Oral, resp. rate 14, height 5\' 1"  (1.549 m), weight 86 kg, SpO2 97 %.  Intake/Output Summary (Last 24 hours) at 08/12/2020 0959 Last data filed at 08/11/2020 1700 Gross per 24 hour  Intake 56.08 ml  Output --  Net 56.08 ml   Filed Weights   08/09/20 1711 08/09/20 2041  Weight: 85.7 kg 86 kg    Exam: Awake Alert, Oriented *3, No new F.N deficits, Normal affect [place].AT,PERRAL Supple Neck,No JVD, No cervical lymphadenopathy appriciated.  Symmetrical Chest wall movement, Good air movement bilaterally, CTAB RRR,No Gallops,Rubs or new Murmurs, No Parasternal Heave +ve B.Sounds, Abd Soft, Non tender, No organomegaly appriciated, No rebound -guarding or rigidity. No Cyanosis, Clubbing or edema, No new Rash or bruise   PERTINENT RADIOLOGIC STUDIES: ECHOCARDIOGRAM COMPLETE  Result Date: 08/10/2020    ECHOCARDIOGRAM REPORT   Patient Name:   Lynn Brown Date of Exam: 08/10/2020 Medical Rec #:  08/12/2020      Height:       61.0 in Accession #:    [id number]      Weight:       189.7 lb Date of Birth:  [birth year]       BSA:          1.847 m Patient Age:    88 years       BP:           119/67 mmHg Patient Gender: F              HR:           68 bpm. Exam Location:  Inpatient Procedure: 2D Echo, 3D Echo, Cardiac Doppler, Color Doppler and Strain Analysis Indications:    Pulmonary Embolus  History:        Patient has prior history of Echocardiogram examinations, most                 recent 09/25/2017. Signs/Symptoms:Murmur.  Sonographer:    11/25/2017 RDCS Referring Phys: Neomia Dear [id number] IMPRESSIONS  1. Findings consistent with acute PE and RV volume/pressure overload (cor pulmonale). RVSP severely elevated ~77 mmHG.  2. Left ventricular ejection fraction, by estimation, is 55 to 60%. The left ventricle has normal function. The left ventricle has no regional wall motion abnormalities. There is mild concentric left ventricular hypertrophy. Left ventricular diastolic parameters are consistent with Grade I diastolic dysfunction (impaired relaxation). There is the interventricular septum is flattened in systole and diastole, consistent with right ventricular pressure and volume overload. The average left ventricular global longitudinal strain is -13.4 %. The global longitudinal strain is abnormal.  3. Right ventricular systolic function is mildly reduced. The right ventricular size is moderately enlarged. There is severely elevated pulmonary artery systolic pressure. The estimated right ventricular systolic pressure is 76.6 mmHg.  4. Right atrial size was mild to moderately dilated.  5. The mitral valve is grossly normal. Trivial mitral valve regurgitation. No evidence of mitral stenosis.  6. Tricuspid valve regurgitation is moderate.  7. The aortic valve is tricuspid. Aortic valve regurgitation is trivial. No aortic stenosis is present.  8. The inferior vena cava is normal in size with greater than 50% respiratory variability, suggesting right atrial pressure of 3 mmHg.  Comparison(s): Changes from prior study are noted. Acute RV failure/cor pulmonale is present. Conclusion(s)/Recommendation(s): Findings consistent with Cor Pulmonale. FINDINGS  Left Ventricle: Left ventricular ejection fraction, by estimation, is 55 to 60%. The left ventricle  has normal function. The left ventricle has no regional wall motion abnormalities. The average left ventricular global longitudinal strain is -13.4 %. The global longitudinal strain is abnormal. 3D left ventricular ejection fraction analysis performed but not reported based on interpreter judgement due to suboptimal quality. The left ventricular internal cavity size was normal in size. There is mild concentric left ventricular hypertrophy. The interventricular septum is flattened in systole and diastole, consistent with right ventricular pressure and volume overload. Left ventricular diastolic parameters are consistent with Grade I diastolic dysfunction (impaired relaxation). Right Ventricle: The right ventricular size is moderately enlarged. No increase in right ventricular wall thickness. Right ventricular systolic function is mildly reduced. There is severely elevated pulmonary artery systolic pressure. The tricuspid regurgitant velocity is 4.29 m/s, and with an assumed right atrial pressure of 3 mmHg, the estimated right ventricular systolic pressure is 76.6 mmHg. Left Atrium: Left atrial size was normal in size. Right Atrium: Right atrial size was mild to moderately dilated. Pericardium: Trivial pericardial effusion is present. Mitral Valve: The mitral valve is grossly normal. Trivial mitral valve regurgitation. No evidence of mitral valve stenosis. Tricuspid Valve: The tricuspid valve is grossly normal. Tricuspid valve regurgitation is moderate . No evidence of tricuspid stenosis. Aortic Valve: The aortic valve is tricuspid. Aortic valve regurgitation is trivial. Aortic regurgitation PHT measures 597 msec. No aortic stenosis is present.  Aortic valve mean gradient measures 3.0 mmHg. Aortic valve peak gradient measures 5.9 mmHg. Aortic valve area, by VTI measures 2.42 cm. Pulmonic Valve: The pulmonic valve was grossly normal. Pulmonic valve regurgitation is mild. No evidence of pulmonic stenosis. Aorta: The aortic root and ascending aorta are structurally normal, with no evidence of dilitation. Venous: The right upper pulmonary vein is normal. The inferior vena cava is normal in size with greater than 50% respiratory variability, suggesting right atrial pressure of 3 mmHg. IAS/Shunts: There is left bowing of the interatrial septum, suggestive of elevated right atrial pressure. The atrial septum is grossly normal.  LEFT VENTRICLE PLAX 2D LVIDd:         3.70 cm     Diastology LVIDs:         2.50 cm     LV e' medial:    2.89 cm/s LV PW:         1.20 cm     LV E/e' medial:  19.7 LV IVS:        1.31 cm     LV e' lateral:   4.73 cm/s LVOT diam:     2.00 cm     LV E/e' lateral: 12.0 LV SV:         58 LV SV Index:   31          2D Longitudinal Strain LVOT Area:     3.14 cm    2D Strain GLS Avg:     -13.4 %  LV Volumes (MOD) LV vol d, MOD A2C: 36.1 ml 3D Volume EF: LV vol d, MOD A4C: 50.7 ml 3D EF:        82 % LV vol s, MOD A2C: 15.7 ml LV EDV:       235 ml LV vol s, MOD A4C: 28.7 ml LV ESV:       43 ml LV SV MOD A2C:     20.4 ml LV SV:        192 ml LV SV MOD A4C:     50.7 ml LV SV MOD BP:  24.2 ml RIGHT VENTRICLE TAPSE (M-mode): 1.5 cm  PULMONARY VEINS                         A Reversal Duration: 106.00 msec                         A Reversal Velocity: 24.40 cm/s                         Diastolic Velocity:  25.30 cm/s                         S/D Velocity:        1.90                         Systolic Velocity:   47.10 cm/s LEFT ATRIUM             Index       RIGHT ATRIUM           Index LA diam:        3.50 cm 1.90 cm/m  RA Area:     29.80 cm LA Vol (A2C):   63.0 ml 34.11 ml/m RA Volume:   117.00 ml 63.35 ml/m LA Vol (A4C):   40.7 ml 22.04 ml/m LA  Biplane Vol: 52.3 ml 28.32 ml/m  AORTIC VALVE                   PULMONIC VALVE AV Area (Vmax):    2.65 cm    PV Vmax:       0.80 m/s AV Area (Vmean):   2.44 cm    PV Vmean:      61.000 cm/s AV Area (VTI):     2.42 cm    PV VTI:        0.215 m AV Vmax:           121.00 cm/s PV Peak grad:  2.6 mmHg AV Vmean:          83.300 cm/s PV Mean grad:  2.0 mmHg AV VTI:            0.239 m AV Peak Grad:      5.9 mmHg AV Mean Grad:      3.0 mmHg LVOT Vmax:         102.00 cm/s LVOT Vmean:        64.600 cm/s LVOT VTI:          0.184 m LVOT/AV VTI ratio: 0.77 AI PHT:            597 msec  AORTA Ao Root diam: 3.30 cm Ao Asc diam:  3.20 cm MITRAL VALVE                TRICUSPID VALVE MV Area (PHT): 2.66 cm     TR Peak grad:   73.6 mmHg MV Decel Time: 285 msec     TR Vmax:        429.00 cm/s MV E velocity: 56.80 cm/s MV A velocity: 101.00 cm/s  SHUNTS MV E/A ratio:  0.56         Systemic VTI:  0.18 m                             Systemic Diam: 2.00 cm Lennie Odor MD  Electronically signed by Lennie OdorWesley O'Neal MD Signature Date/Time: 08/10/2020/12:04:30 PM    Final    VAS US LOWER EXTREMITY VENOUS (DVT)  Result Date: 08/11/2020  Lower Venous DVT Study Indications: Swelling, and pulmonary embolism.  Risk Factors: None identified. Anticoagulation: Heparin. Comparison Study: No previous Performing Technologist: Clint GuyLisa Gibson RVT  Examination Guidelines: A complete evaluation includes B-mode imaging, spectral Doppler, color Doppler, and power Doppler as needed of all accessible portions of each vessel. Bilateral testing is considered an integral part of a complete examination. Limited examinations for reoccurring indications may be performed as noted. The reflux portion of the exam is performed with the patient in reverse Trendelenburg.  +---------+---------------+---------+-----------+----------+--------------+ RIGHT    CompressibilityPhasicitySpontaneityPropertiesThrombus Aging  +---------+---------------+---------+-----------+----------+--------------+ CFV      Full           Yes      Yes                                 +---------+---------------+---------+-----------+----------+--------------+ SFJ      Full                                                        +---------+---------------+---------+-----------+----------+--------------+ FV Prox  Full                                                        +---------+---------------+---------+-----------+----------+--------------+ FV Mid   Full                                                        +---------+---------------+---------+-----------+----------+--------------+ FV DistalFull                                                        +---------+---------------+---------+-----------+----------+--------------+ PFV      Full                                                        +---------+---------------+---------+-----------+----------+--------------+ POP      Full           Yes      Yes                                 +---------+---------------+---------+-----------+----------+--------------+ PTV      Full                                                        +---------+---------------+---------+-----------+----------+--------------+  PERO     Full                                                        +---------+---------------+---------+-----------+----------+--------------+   +---------+---------------+---------+-----------+----------+--------------+ LEFT     CompressibilityPhasicitySpontaneityPropertiesThrombus Aging +---------+---------------+---------+-----------+----------+--------------+ CFV      Full           Yes      Yes                                 +---------+---------------+---------+-----------+----------+--------------+ SFJ      Full                                                         +---------+---------------+---------+-----------+----------+--------------+ FV Prox  Full                                                        +---------+---------------+---------+-----------+----------+--------------+ FV Mid   Full                                                        +---------+---------------+---------+-----------+----------+--------------+ FV DistalFull                                                        +---------+---------------+---------+-----------+----------+--------------+ PFV      Full                                                        +---------+---------------+---------+-----------+----------+--------------+ POP      Full           Yes      Yes                                 +---------+---------------+---------+-----------+----------+--------------+ PTV      Full                                                        +---------+---------------+---------+-----------+----------+--------------+ PERO     Full                                                        +---------+---------------+---------+-----------+----------+--------------+  Summary: BILATERAL: - No evidence of deep vein thrombosis seen in the lower extremities, bilaterally. - No evidence of superficial venous thrombosis in the lower extremities, bilaterally. - RIGHT: - A cystic structure is found in the popliteal fossa.  LEFT: - No cystic structure found in the popliteal fossa.  *See table(s) above for measurements and observations. Electronically signed by Lemar Livings MD on 08/11/2020 at 5:59:23 PM.    Final      PERTINENT LAB RESULTS: CBC: Recent Labs    08/10/20 0209 08/11/20 0135  WBC 5.6 4.9  HGB 12.5 12.3  HCT 38.5 38.1  PLT 161 162   CMET CMP     Component Value Date/Time   NA 138 08/09/2020 1041   NA 142 07/04/2018 1404   K 4.1 08/09/2020 1041   CL 109 08/09/2020 1041   CO2 22 08/09/2020 1041   GLUCOSE 118 (H) 08/09/2020 1041   BUN 22  08/09/2020 1041   BUN 19 07/04/2018 1404   CREATININE 1.02 (H) 08/09/2020 1041   CALCIUM 9.1 08/09/2020 1041   PROT 6.0 (L) 03/10/2018 0647   PROT 7.3 09/25/2017 0943   ALBUMIN 3.2 (L) 03/10/2018 0647   ALBUMIN 4.5 09/25/2017 0943   AST 28 03/10/2018 0647   ALT 26 03/10/2018 0647   ALKPHOS 45 03/10/2018 0647   BILITOT 1.0 03/10/2018 0647   BILITOT 0.5 09/25/2017 0943   GFRNONAA 54 (L) 08/09/2020 1041   GFRAA 58 (L) 07/04/2018 1404    GFR Estimated Creatinine Clearance: 39.4 mL/min (A) (by C-G formula based on SCr of 1.02 mg/dL (H)). No results for input(s): LIPASE, AMYLASE in the last 72 hours. No results for input(s): CKTOTAL, CKMB, CKMBINDEX, TROPONINI in the last 72 hours. Invalid input(s): POCBNP No results for input(s): DDIMER in the last 72 hours. No results for input(s): HGBA1C in the last 72 hours. No results for input(s): CHOL, HDL, LDLCALC, TRIG, CHOLHDL, LDLDIRECT in the last 72 hours. No results for input(s): TSH, T4TOTAL, T3FREE, THYROIDAB in the last 72 hours.  Invalid input(s): FREET3 No results for input(s): VITAMINB12, FOLATE, FERRITIN, TIBC, IRON, RETICCTPCT in the last 72 hours. Coags: No results for input(s): INR in the last 72 hours.  Invalid input(s): PT Microbiology: Recent Results (from the past 240 hour(s))  SARS CORONAVIRUS 2 (TAT 6-24 HRS) Nasopharyngeal Nasopharyngeal Swab     Status: None   Collection Time: 08/09/20 12:49 PM   Specimen: Nasopharyngeal Swab  Result Value Ref Range Status   SARS Coronavirus 2 NEGATIVE NEGATIVE Final    Comment: (NOTE) SARS-CoV-2 target nucleic acids are NOT DETECTED.  The SARS-CoV-2 RNA is generally detectable in upper and lower respiratory specimens during the acute phase of infection. Negative results do not preclude SARS-CoV-2 infection, do not rule out co-infections with other pathogens, and should not be used as the sole basis for treatment or other patient management decisions. Negative results must be  combined with clinical observations, patient history, and epidemiological information. The expected result is Negative.  Fact Sheet for Patients: HairSlick.no  Fact Sheet for Healthcare Providers: quierodirigir.com  This test is not yet approved or cleared by the [place] FDA and  has been authorized for detection and/or diagnosis of SARS-CoV-2 by FDA under an Emergency Use Authorization (EUA). This EUA will remain  in effect (meaning this test can be used) for the duration of the COVID-19 declaration under Se ction 564(b)(1) of the Act, 21 U.S.C. section 360bbb-3(b)(1), unless the authorization is terminated or revoked sooner.  Performed at [facility]  [facility], [address], [place], [place] [address]     FURTHER DISCHARGE INSTRUCTIONS:  Get Medicines reviewed and adjusted: Please take all your medications with you for your next visit with your Primary MD  Laboratory/radiological data: Please request your Primary MD to go over all hospital tests and procedure/radiological results at the follow up, please ask your Primary MD to get all Hospital records sent to his/her office.  In some cases, they will be blood work, cultures and biopsy results pending at the time of your discharge. Please request that your primary care M.D. goes through all the records of your hospital data and follows up on these results.  Also Note the following: If you experience worsening of your admission symptoms, develop shortness of breath, life threatening emergency, suicidal or homicidal thoughts you must seek medical attention immediately by calling 911 or calling your MD immediately  if symptoms less severe.  You must read complete instructions/literature along with all the possible adverse reactions/side effects for all the Medicines you take and that have been prescribed to you. Take any new Medicines after you have completely understood and  accpet all the possible adverse reactions/side effects.   Do not drive when taking Pain medications or sleeping medications (Benzodaizepines)  Do not take more than prescribed Pain, Sleep and Anxiety Medications. It is not advisable to combine anxiety,sleep and pain medications without talking with your primary care practitioner  Special Instructions: If you have smoked or chewed Tobacco  in the last 2 yrs please stop smoking, stop any regular Alcohol  and or any Recreational drug use.  Wear Seat belts while driving.  Please note: You were cared for by a hospitalist during your hospital stay. Once you are discharged, your primary care physician will handle any further medical issues. Please note that NO REFILLS for any discharge medications will be authorized once you are discharged, as it is imperative that you return to your primary care physician (or establish a relationship with a primary care physician if you do not have one) for your post hospital discharge needs so that they can reassess your need for medications and monitor your lab values.  Total Time spent coordinating discharge including counseling, education and face to face time equals 35 minutes.  SignedJeoffrey Massed 08/12/2020 9:59 AM

## 2020-08-12 NOTE — Plan of Care (Signed)
Discharge instructions reviewed with patient, all questions/concerns addressed. Pt a&o x4, in no distress, breathing even and unlabored, no SOB noted. Pt denies any s/sx at this time. Vitals WNL. Pt to DC home w/ familyl.

## 2020-08-12 NOTE — Evaluation (Signed)
Occupational Therapy Evaluation Patient Details Name: Lynn Brown MRN: [id number] DOB: 03-Apr-1932 Today's Date: 08/12/2020    History of Present Illness 87 y.o. female presented with right-sided chest pain and shortness of breath which has been going on for about a month gradually getting worse. 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 Pt occasionally used a cane in the community. Pt with increasing difficulty with IADLs leading up to admission requiring assist from daughter. Pt presents now with minor deficits in standing balance, endurance and cardiopulmonary tolerance. Pt overall Supervision for transfers, min guard for safety in mobility without AD (used railing in hallway for support). Pt overall Setup for UB ADLs and Min A for LB ADLs due to difficulty with R knee ROM and reaching feet. Educated pt on energy conservation strategies and use of AE to maximize efficiency with LB ADLs - plan to assess carryover of these strategies during next session. Discussed use of Rollator for improved balance and use of energy conservation strategies during daily tasks. VSS on RA.    Follow Up Recommendations  No OT follow up;Supervision - Intermittent    Equipment Recommendations  Other (comment) (Rollator)    Recommendations for Other Services       Precautions / Restrictions Precautions Precautions: Fall Restrictions Weight Bearing Restrictions: No      Mobility Bed Mobility Overal bed mobility: Modified Independent             General bed mobility comments: Incr time    Transfers Overall transfer level: Needs assistance Equipment used: None Transfers: Sit to/from Stand Sit to Stand:  Supervision         General transfer comment: Incr time to rise. Supervision for safety and lines    Balance Overall balance assessment: Mild deficits observed, not formally tested                                         ADL either performed or assessed with clinical judgement   ADL Overall ADL's : Needs assistance/impaired Eating/Feeding: Independent;Sitting   Grooming: Set up;Standing;Wash/dry hands Grooming Details (indicate cue type and reason): No LOB, hand hygiene after toileting Upper Body Bathing: Set up;Sitting   Lower Body Bathing: Min guard;Sit to/from stand   Upper Body Dressing : Set up;Sitting   Lower Body Dressing: Minimal assistance;Sit to/from stand   Toilet Transfer: Min guard;Ambulation Toilet Transfer Details (indicate cue type and reason): furniture walking to bathroom, use of grab bars but has higher toilet at home Toileting- Clothing Manipulation and Hygiene: Set up;Sit to/from stand       Functional mobility during ADLs: Min guard General ADL Comments: Limited by decreased cardiopulmonary tolerance, weakness and higher level dynamic balance though pt reports progressing since admission. Educated on energy conservation strategies with handout provided (as well as AE handout education) with pt already implementing tasks but receptive of new strategies to trial. Discussed use of Rollator for use of energy conservation as pt has practiced this with PT     Vision Baseline Vision/History: Wears glasses Wears Glasses: At all times Patient Visual Report: No change from baseline Vision Assessment?: No apparent visual  deficits     Perception     Praxis      Pertinent Vitals/Pain Pain Assessment: Faces Faces Pain Scale: Hurts a little bit Pain Location: R knee with mobility Pain Descriptors / Indicators: Sore Pain Intervention(s): Monitored during session     Hand Dominance Right   Extremity/Trunk Assessment Upper Extremity  Assessment Upper Extremity Assessment: Overall WFL for tasks assessed   Lower Extremity Assessment Lower Extremity Assessment: Defer to PT evaluation   Cervical / Trunk Assessment Cervical / Trunk Assessment: Kyphotic   Communication Communication Communication: HOH   Cognition Arousal/Alertness: Awake/alert Behavior During Therapy: WFL for tasks assessed/performed Overall Cognitive Status: Within Functional Limits for tasks assessed                                     General Comments  SpO2 > 92% on RA, HR WFL though pt 2/4 DOE and fatigued after hallway mobility.    Exercises     Shoulder Instructions      Home Living Family/patient expects to be discharged to:: Private residence Living Arrangements: Children Available Help at Discharge: Family;Available 24 hours/day Type of Home: House Home Access: Level entry     Home Layout: Multi-level Alternate Level Stairs-Number of Steps: 10 Alternate Level Stairs-Rails: Right Bathroom Shower/Tub: Tub/shower unit   Bathroom Toilet: Handicapped height Bathroom Accessibility: Yes   Home Equipment: Shower seat;Cane - single point;Hand held shower head;Adaptive equipment Adaptive Equipment: Reacher        Prior Functioning/Environment Level of Independence: Needs assistance  Gait / Transfers Assistance Needed: ambulates without AD in house and limited community distances wtih cane ADL's / Homemaking Assistance Needed: Independent with ADLs, daughter has been assisting with IADLs due to pt fatigue recently. Typically, pt able to cook simple meals (eggs on stove, etc)            OT Problem List: Decreased strength;Decreased activity tolerance;Impaired balance (sitting and/or standing);Cardiopulmonary status limiting activity      OT Treatment/Interventions: Self-care/ADL training;Therapeutic exercise;Energy conservation;DME and/or AE instruction;Therapeutic activities;Patient/family education;Balance  training    OT Goals(Current goals can be found in the care plan section) Acute Rehab OT Goals Patient Stated Goal: continue improving breathing and endurance OT Goal Formulation: With patient Time For Goal Achievement: 08/26/20 Potential to Achieve Goals: Good ADL Goals Pt Will Perform Lower Body Dressing: with modified independence;with adaptive equipment;sit to/from stand;sitting/lateral leans Additional ADL Goal #1: Pt to verbalize at least 3 energy conservation strategies to implement during ADLs/IADLs Additional ADL Goal #2: Pt to demo mobility using Rollator at Modified Independence with appropriate safety techniques during ADL/IADL tasks  OT Frequency: Min 2X/week   Barriers to D/C:            Co-evaluation              AM-PAC OT "6 Clicks" Daily Activity     Outcome Measure Help from another person eating meals?: None Help from another person taking care of personal grooming?: A Little Help from another person toileting, which includes using toliet, bedpan, or urinal?: A Little Help from another person bathing (including washing, rinsing, drying)?: A Little Help from another person to put on and taking off regular upper body clothing?: A Little Help from another person to put on and taking off regular lower body clothing?: A Little 6 Click Score: 19   End of Session Equipment Utilized During Treatment: Gait belt Nurse Communication: Mobility status  Activity Tolerance: Patient tolerated treatment well Patient left: in chair;with call bell/phone within reach;with chair alarm set  OT Visit Diagnosis: Other abnormalities of gait and mobility (R26.89)                Time: 2952-8413 OT Time Calculation (min): 32 min Charges:  OT General Charges $OT Visit: 1 Visit OT Evaluation $OT Eval Moderate Complexity: 1 Mod OT Treatments $Self Care/Home Management : 8-22 mins  Bradd Canary, OTR/L Acute Rehab Services Office: [phone]  Lorre Munroe 08/12/2020, 8:10 AM

## 2020-08-12 NOTE — TOC Transition Note (Signed)
Transition of Care [facility]) - CM/SW Discharge Note   Patient Details  Name: Lynn Brown MRN: [id number] Date of Birth: 1932/02/14  [facility] CM/SW Contact:  Epifanio Lesches, RN Phone Number: 08/12/2020, 2:30 PM   Clinical Narrative:    Patient will DC to: home Anticipated DC date: 08/12/2020 Family notified: yes, daughter Transport by: car   Admitted with PE, hx of PAD-s/p left SFA/popliteal artery stenting on 04/2020, HTN, HLD. Per MD patient ready for DC today. RN, patient, and patient's family notified of DC. Order noted for [facility] services PT/OT. Pt ageeable to services, no preference. Referral made with [facility] and accepted, SOC early next week, Monday or Tuesday. [facility] will address OT needs.  [facility] pharmacy delivered Rx med to pt prior to d/c.  Post hospital follow appointments noted on AVS.  RNCM will sign off for now as intervention is no longer needed. Please consult [place] again if new needs arise.    Final next level of care: Home w Home Health Services Barriers to Discharge: No Barriers Identified   Patient Goals and CMS Choice     Choice offered to / list presented to : Patient  Discharge Placement                       Discharge Plan and Services                          HH Arranged: PT [facility] Agency: [facility] Date HH Agency Contacted: 08/12/20 Time HH Agency Contacted: 1348    Social Determinants of Health (SDOH) Interventions     Readmission Risk Interventions No flowsheet data found.

## 2020-08-12 NOTE — Care Management Important Message (Signed)
Important Message  Patient Details  Name: LARIAH FLEER MRN: [id number] Date of Birth: 02/19/1932   Medicare Important Message Given:  Yes - Important Message mailed due to current National Emergency  Verbal consent obtained due to current National Emergency    Contact Name: Anayelli Call Date: 08/12/20  Time: 1136 Phone: [phone] Outcome: Spoke with contact Important Message mailed to: Patient address on file    Orson Aloe 08/12/2020, 11:37 AM

## 2020-08-15 ENCOUNTER — Telehealth: Payer: Self-pay | Admitting: Physician Assistant

## 2020-08-15 NOTE — Telephone Encounter (Signed)
Received a new hem referral from the hospital for pe. Ms. Lynn Brown has been cld and scheduled to see Karena Addison on 3/23 at 9am. Appt date and time has been given to the pt's daughter. Aware to arrive 20 minutes early.

## 2020-08-16 DIAGNOSIS — I10 Essential (primary) hypertension: Secondary | ICD-10-CM | POA: Diagnosis not present

## 2020-08-16 DIAGNOSIS — R269 Unspecified abnormalities of gait and mobility: Secondary | ICD-10-CM | POA: Diagnosis not present

## 2020-08-16 DIAGNOSIS — M6281 Muscle weakness (generalized): Secondary | ICD-10-CM | POA: Diagnosis not present

## 2020-08-16 DIAGNOSIS — I2693 Single subsegmental pulmonary embolism without acute cor pulmonale: Secondary | ICD-10-CM | POA: Diagnosis not present

## 2020-08-16 DIAGNOSIS — M179 Osteoarthritis of knee, unspecified: Secondary | ICD-10-CM | POA: Diagnosis not present

## 2020-08-16 NOTE — Progress Notes (Signed)
[facility] [phone]   [phone]  INITIAL CONSULT NOTE  Patient Care Team: Laurann Montana, MD as PCP - General (Family Medicine) Lyn Records, MD as PCP - Cardiology (Cardiology)  Hematological/Oncological History 1) 08/09/2020-08/12/2020:  -Admitted for extensive pulmonary emboli in the right lun -Echo:EF 55-60%, RVSP 77 mmHg-RV systolic function mildly reduced. -Bilateral LE doppler US: No DVT -IV heparin x 48 hours and then transitioned to Eliquis  2) 08/17/2020: Establish care with Georga Kaufmann PA-C   CHIEF COMPLAINTS/PURPOSE OF CONSULTATION:  "Pulmonary emboli"  HISTORY OF PRESENTING ILLNESS:  Lynn Brown 87 y.o. female with medical history significant for PAD-s/p left SFA/popliteal artery stenting on 04/2020, HTN and HLD.   On review of the previous records, patient presented to the emergency room on 08/09/2020 due to worsening shortness of breath upon exertion and right sided shoulder pain. CTA chest was obtained that revealed extensive pulmonary emboli within the right lung.  There was CT evidence of right heart strain consistent with at least a submassive PE.  Echocardiogram revealed RV systolic function was mildly reduced with an EF of 55 to 60%.  Bilateral lower extremity Doppler ultrasound was obtained that was negative for DVT. Patient was started on IV heparin x 48 hours and then transitioned to Eliquis.  On exam today, patient reports improvement of pleuritic chest pain and shortness of breath.  Her energy levels are overall stable where she can complete her ADLs on her own.  She does use a cane to ambulate.  Patient has a good appetite without any noticeable weight changes.  She denies any nausea, vomiting or abdominal pain.  Patient has regular bowel movements without any hematochezia or melena.  Patient has chronic lower extremity edema secondary to PAD. Patient denies any fevers, chills, chest pain or cough.  She has no other  complaints.  Rest of the 10 point ROS is low.  MEDICAL HISTORY:  Past Medical History:  Diagnosis Date  . Allergic rhinitis   . Anxiety   . Bunion    PES PLANUS  . Hearing loss    HEARING AIDS, BUT DOES NOT WEAR THEM CONSISTENTLY.   Marland Kitchen Hypercholesteremia   . Hypertension   . Hypothyroid   . Leaky heart valve    DR. HARWANI  . Mild obesity   . Osteoarthritis of knees, bilateral   . Osteoporosis 12/2017   T score -3.3  . Venous insufficiency     SURGICAL HISTORY: Past Surgical History:  Procedure Laterality Date  . ABDOMINAL AORTOGRAM W/LOWER EXTREMITY Left 05/04/2020   Procedure: ABDOMINAL AORTOGRAM W/LOWER EXTREMITY;  Surgeon: Leonie Douglas, MD;  Location: [facility];  Service: Cardiovascular;  Laterality: Left;  . CATARACT EXTRACTION, BILATERAL  2013   DR. SHAPIRO   . HYSTEROSCOPY     D & C  . LEFT HEART CATH AND CORONARY ANGIOGRAPHY N/A 03/10/2018   Procedure: LEFT HEART CATH AND CORONARY ANGIOGRAPHY;  Surgeon: Lyn Records, MD;  Location: [facility];  Service: Cardiovascular;  Laterality: N/A;  . NEPHRECTOMY LIVING DONOR  1978  . PERIPHERAL VASCULAR INTERVENTION Left 05/04/2020   Procedure: PERIPHERAL VASCULAR INTERVENTION;  Surgeon: Leonie Douglas, MD;  Location: [facility];  Service: Cardiovascular;  Laterality: Left;  Femoral popliteal    SOCIAL HISTORY: Social History   Socioeconomic History  . Marital status: Widowed    Spouse name: Not on file  . Number of children: 4  . Years of education: Not on  file  . Highest education level: Not on file  Occupational History  . Occupation: RETIRED  Tobacco Use  . Smoking status: Never Smoker  . Smokeless tobacco: Never Used  Vaping Use  . Vaping Use: Never used  Substance and Sexual Activity  . Alcohol use: No    Alcohol/week: 0.0 standard drinks  . Drug use: No  . Sexual activity: Never    Comment: 1st intercourse 87 yo-Fewer than 5 partners  Other Topics Concern  . Not on file   Social History Narrative  . Not on file   Social Determinants of Health   Financial Resource Strain: Not on file  Food Insecurity: Not on file  Transportation Needs: Not on file  Physical Activity: Not on file  Stress: Not on file  Social Connections: Not on file  Intimate Partner Violence: Not on file    FAMILY HISTORY: Family History  Problem Relation Age of Onset  . Hypertension Mother   . CVA Mother   . Diabetes Sister   . Hypertension Sister   . Heart disease Sister   . Multiple sclerosis Sister   . Hypertension Father   . CVA Father   . Cancer Brother        prostrate  . Hypertension Brother   . CVA Brother   . Seizures Brother   . Pancreatic cancer Sister   . Kidney disease Sister   . Kidney failure Sister   . Other Brother        PNEUMONIA AS A BABY  . Hypertension Brother   . Lymphoma Daughter   . Deep vein thrombosis Sister   . Pulmonary embolism Niece   . Breast cancer Neg Hx     ALLERGIES:  has No Known Allergies.  MEDICATIONS:  Current Outpatient Medications  Medication Sig Dispense Refill  . Acetaminophen 500 MG capsule     . APIXABAN (ELIQUIS) VTE STARTER PACK (10MG  AND 5MG ) Take as directed on package: start with two-5mg  tablets twice daily for 7 days. On day 8, switch to one-5mg  tablet twice daily. 1 each 0  . ascorbic acid (VITAMIN C) 500 MG tablet Take 500 mg by mouth in the morning and at bedtime.    aspirin EC 81 MG tablet Take 1 tablet (81 mg total) by mouth daily. Swallow whole. 90 tablet 3  . Calcium Citrate-Vitamin D 315-250 MG-UNIT TABS Take 1 tablet by mouth daily.    . cetirizine (ZYRTEC) 10 MG tablet Take 10 mg by mouth daily as needed (allergies.).     Cholecalciferol (VITAMIN D) 2000 UNITS tablet Take 2,000 Units by mouth daily.    . clorazepate (TRANXENE) 7.5 MG tablet Take 7.5 mg by mouth daily as needed for anxiety.     . Coenzyme Q10 (COQ10) 50 MG CAPS Take 50 mg by mouth daily at 2 PM.    . levothyroxine (SYNTHROID) 112  MCG tablet Take 112 mcg by mouth every morning.    . magnesium oxide (MAG-OX) 400 MG tablet Take 400 mg by mouth every evening.     . metoprolol tartrate (LOPRESSOR) 25 MG tablet Take 0.5 tablets (12.5 mg total) by mouth 2 (two) times daily. 60 tablet 0  . nitroGLYCERIN (NITROSTAT) 0.4 MG SL tablet Place 1 tablet (0.4 mg total) under the tongue every 5 (five) minutes as needed for chest pain. (Patient taking differently: Place 0.4 mg under the tongue every 5 (five) minutes x 3 doses as needed for chest pain.) 30 tablet 0  .  ofloxacin (OCUFLOX) 0.3 % ophthalmic solution Place 1 drop into the right eye 4 (four) times daily for 21 days. 5 mL 0  . prednisoLONE acetate (PRED FORTE) 1 % ophthalmic suspension Place 1 drop into the right eye 4 (four) times daily for 21 days. 5 mL 0  . rosuvastatin (CRESTOR) 20 MG tablet Take 20 mg by mouth every evening.     Marland Kitchen. spironolactone (ALDACTONE) 25 MG tablet TAKE 1 TABLET BY MOUTH  DAILY (Patient taking differently: Take 25 mg by mouth daily.) 90 tablet 3   No current facility-administered medications for this visit.    REVIEW OF SYSTEMS:   Constitutional: ( - ) fevers, ( - )  chills , ( - ) night sweats Eyes: ( - ) blurriness of vision, ( - ) double vision, ( - ) watery eyes Ears, nose, mouth, throat, and face: ( - ) mucositis, ( - ) sore throat Respiratory: ( - ) cough, ( - ) dyspnea, ( - ) wheezes Cardiovascular: ( - ) palpitation, ( - ) chest discomfort, ( + ) lower extremity swelling Gastrointestinal:  ( - ) nausea, ( - ) heartburn, ( - ) change in bowel habits Skin: ( - ) abnormal skin rashes Lymphatics: ( - ) new lymphadenopathy, ( - ) easy bruising Neurological: ( - ) numbness, ( - ) tingling, ( - ) new weaknesses Behavioral/Psych: ( - ) mood change, ( - ) new changes  All other systems were reviewed with the patient and are negative.  PHYSICAL EXAMINATION: ECOG PERFORMANCE STATUS: 1 - Symptomatic but completely ambulatory  Vitals:   08/17/20  0908  BP: (!) 144/78  Pulse: 68  Resp: 20  Temp: (!) 97 F (36.1 C)  SpO2: 99%   Filed Weights   08/17/20 0908  Weight: 190 lb 12.8 oz (86.5 kg)    GENERAL: well appearing female in NAD  SKIN: skin color, texture, turgor are normal, no rashes or significant lesions EYES: conjunctiva are pink and non-injected, sclera clear OROPHARYNX: no exudate, no erythema; lips, buccal mucosa, and tongue normal  NECK: supple, non-tender LYMPH:  no palpable lymphadenopathy in the cervical, axillary or supraclavicular lymph nodes.  LUNGS: clear to auscultation and percussion with normal breathing effort HEART: regular rate & rhythm and no murmurs. +bilateral lower extremity edema with venous stasis dermatitis.   ABDOMEN: soft, non-tender, non-distended, normal bowel sounds Musculoskeletal: no cyanosis of digits and no clubbing  PSYCH: alert & oriented x 3, fluent speech NEURO: no focal motor/sensory deficits  LABORATORY DATA:  I have reviewed the data as listed CBC Latest Ref Rng & Units 08/17/2020 08/11/2020 08/10/2020  WBC 4.0 - 10.5 K/uL 3.7(L) 4.9 5.6  Hemoglobin 12.0 - 15.0 g/dL 16.113.9 09.612.3 04.512.5  Hematocrit 36.0 - 46.0 % 43.1 38.1 38.5  Platelets 150 - 400 K/uL 222 162 161    CMP Latest Ref Rng & Units 08/17/2020 08/09/2020 05/04/2020  Glucose 70 - 99 mg/dL 409(W108(H) 119(J118(H) 478(G103(H)  BUN 8 - 23 mg/dL 16 22 95(A28(H)  Creatinine 0.44 - 1.00 mg/dL 2.13(Y1.04(H) 8.65(H1.02(H) 8.461.00  Sodium 135 - 145 mmol/L 142 138 141  Potassium 3.5 - 5.1 mmol/L 5.3(H) 4.1 4.5  Chloride 98 - 111 mmol/L 106 109 106  CO2 22 - 32 mmol/L 26 22 -  Calcium 8.9 - 10.3 mg/dL 9.5 9.1 -  Total Protein 6.5 - 8.1 g/dL 8.0 - -  Total Bilirubin 0.3 - 1.2 mg/dL 0.6 - -  Alkaline Phos 38 - 126 U/L 74 - -  AST 15 - 41 U/L 17 - -  ALT 0 - 44 U/L 20 - -   RADIOGRAPHIC STUDIES: I have personally reviewed the radiological images as listed and agreed with the findings in the report. DG Chest 2 View  Result Date: 08/09/2020 CLINICAL DATA:   Shortness of breath for 2 weeks. Right-sided rib pain. EXAM: CHEST - 2 VIEW COMPARISON:  03/09/2018. FINDINGS: Lungs are suboptimally inflated. Head aortic atherosclerosis. Normal heart size. Asymmetric hazy opacities within the left midlung and left lower lobe are noted. The right hilum appears asymmetrically enlarged and increased in size compared with previous imaging. Multi level degenerative disc disease identified within the thoracic spine. IMPRESSION: 1. Asymmetric hazy opacities within the left midlung and left lower lobe concerning for pneumonia. 2. Asymmetric enlargement of the right hilum. This is nonspecific and may reflect AP technique and hypoinflation. Recommend further evaluation with nonemergent contrast-enhanced CT of the chest. Electronically Signed   By: Signa Kell M.D.   On: 08/09/2020 11:18   DG Ribs Unilateral Right  Result Date: 08/09/2020 CLINICAL DATA:  Right rib pain and shortness of breath for 2 weeks. EXAM: RIGHT RIBS - 2 VIEW COMPARISON:  Chest radiograph of earlier today. FINDINGS: Two views of right-sided ribs demonstrate no displaced fracture. No pleural fluid or pneumothorax. Osteopenia. S shaped thoracic spine curvature. IMPRESSION: No acute osseous abnormality. Electronically Signed   By: Jeronimo Greaves M.D.   On: 08/09/2020 12:20   CT Angio Chest PE W and/or Wo Contrast  Result Date: 08/09/2020 CLINICAL DATA:  Chest pain, shortness of breath EXAM: CT ANGIOGRAPHY CHEST WITH CONTRAST TECHNIQUE: Multidetector CT imaging of the chest was performed using the standard protocol during bolus administration of intravenous contrast. Multiplanar CT image reconstructions and MIPs were obtained to evaluate the vascular anatomy. CONTRAST:  74mL OMNIPAQUE IOHEXOL 350 MG/ML SOLN COMPARISON:  Chest x-ray today FINDINGS: Cardiovascular: Extensive pulmonary embolus noted in the right pulmonary arteries including right main pulmonary artery with occlusion of the right lower lobe pulmonary  artery. Pulmonary emboli also noted in the right upper lobe. No pulmonary emboli on the left. Evidence of right heart strain with an RV/LV ratio of 1.22. Reflux of contrast into the IVC and hepatic veins suggests right heart dysfunction. Coronary artery and aortic atherosclerosis. No aortic aneurysm. Mediastinum/Nodes: No mediastinal, hilar, or axillary adenopathy. Trachea and esophagus are unremarkable. Thyroid unremarkable. Lungs/Pleura: Atelectasis noted medially and posteriorly in the left lung base. No effusions or confluent opacities otherwise. Upper Abdomen: Scattered hypodensities throughout the liver cannot be characterized on this study, favor cysts. Musculoskeletal: Chest wall soft tissues are unremarkable. No acute bony abnormality. Review of the MIP images confirms the above findings. IMPRESSION: Extensive pulmonary emboli within the right lung. CT evidence of right heart strain (RV/LV Ratio = 1.22) consistent with at least submassive (intermediate risk) PE. The presence of right heart strain has been associated with an increased risk of morbidity and mortality. Coronary artery disease. Atelectasis in the left lower lobe. Aortic Atherosclerosis (ICD10-I70.0). These results were called by telephone at the time of interpretation on 08/09/2020 at 1:56 pm to provider [facility] , who verbally acknowledged these results. Electronically Signed   By: Charlett Nose M.D.   On: 08/09/2020 13:57   ECHOCARDIOGRAM COMPLETE  Result Date: 08/10/2020    ECHOCARDIOGRAM REPORT   Patient Name:   DEISHA STULL Date of Exam: 08/10/2020 Medical Rec #:  [id number]      Height:       61.0 in  Accession #:    [id number]     Weight:       189.7 lb Date of Birth:  1931-06-18       BSA:          1.847 m Patient Age:    88 years       BP:           119/67 mmHg Patient Gender: F              HR:           68 bpm. Exam Location:  Inpatient Procedure: 2D Echo, 3D Echo, Cardiac Doppler, Color Doppler and Strain Analysis Indications:     Pulmonary Embolus  History:        Patient has prior history of Echocardiogram examinations, most                 recent 09/25/2017. Signs/Symptoms:Murmur.  Sonographer:    Neomia Dear RDCS Referring Phys: [id number] Emeline General IMPRESSIONS  1. Findings consistent with acute PE and RV volume/pressure overload (cor pulmonale). RVSP severely elevated ~77 mmHG.  2. Left ventricular ejection fraction, by estimation, is 55 to 60%. The left ventricle has normal function. The left ventricle has no regional wall motion abnormalities. There is mild concentric left ventricular hypertrophy. Left ventricular diastolic parameters are consistent with Grade I diastolic dysfunction (impaired relaxation). There is the interventricular septum is flattened in systole and diastole, consistent with right ventricular pressure and volume overload. The average left ventricular global longitudinal strain is -13.4 %. The global longitudinal strain is abnormal.  3. Right ventricular systolic function is mildly reduced. The right ventricular size is moderately enlarged. There is severely elevated pulmonary artery systolic pressure. The estimated right ventricular systolic pressure is 76.6 mmHg.  4. Right atrial size was mild to moderately dilated.  5. The mitral valve is grossly normal. Trivial mitral valve regurgitation. No evidence of mitral stenosis.  6. Tricuspid valve regurgitation is moderate.  7. The aortic valve is tricuspid. Aortic valve regurgitation is trivial. No aortic stenosis is present.  8. The inferior vena cava is normal in size with greater than 50% respiratory variability, suggesting right atrial pressure of 3 mmHg. Comparison(s): Changes from prior study are noted. Acute RV failure/cor pulmonale is present. Conclusion(s)/Recommendation(s): Findings consistent with Cor Pulmonale. FINDINGS  Left Ventricle: Left ventricular ejection fraction, by estimation, is 55 to 60%. The left ventricle has normal function. The left  ventricle has no regional wall motion abnormalities. The average left ventricular global longitudinal strain is -13.4 %. The global longitudinal strain is abnormal. 3D left ventricular ejection fraction analysis performed but not reported based on interpreter judgement due to suboptimal quality. The left ventricular internal cavity size was normal in size. There is mild concentric left ventricular hypertrophy. The interventricular septum is flattened in systole and diastole, consistent with right ventricular pressure and volume overload. Left ventricular diastolic parameters are consistent with Grade I diastolic dysfunction (impaired relaxation). Right Ventricle: The right ventricular size is moderately enlarged. No increase in right ventricular wall thickness. Right ventricular systolic function is mildly reduced. There is severely elevated pulmonary artery systolic pressure. The tricuspid regurgitant velocity is 4.29 m/s, and with an assumed right atrial pressure of 3 mmHg, the estimated right ventricular systolic pressure is 76.6 mmHg. Left Atrium: Left atrial size was normal in size. Right Atrium: Right atrial size was mild to moderately dilated. Pericardium: Trivial pericardial effusion is present. Mitral Valve: The mitral valve is grossly normal. Trivial mitral  valve regurgitation. No evidence of mitral valve stenosis. Tricuspid Valve: The tricuspid valve is grossly normal. Tricuspid valve regurgitation is moderate . No evidence of tricuspid stenosis. Aortic Valve: The aortic valve is tricuspid. Aortic valve regurgitation is trivial. Aortic regurgitation PHT measures 597 msec. No aortic stenosis is present. Aortic valve mean gradient measures 3.0 mmHg. Aortic valve peak gradient measures 5.9 mmHg. Aortic valve area, by VTI measures 2.42 cm. Pulmonic Valve: The pulmonic valve was grossly normal. Pulmonic valve regurgitation is mild. No evidence of pulmonic stenosis. Aorta: The aortic root and ascending aorta  are structurally normal, with no evidence of dilitation. Venous: The right upper pulmonary vein is normal. The inferior vena cava is normal in size with greater than 50% respiratory variability, suggesting right atrial pressure of 3 mmHg. IAS/Shunts: There is left bowing of the interatrial septum, suggestive of elevated right atrial pressure. The atrial septum is grossly normal.  LEFT VENTRICLE PLAX 2D LVIDd:         3.70 cm     Diastology LVIDs:         2.50 cm     LV e' medial:    2.89 cm/s LV PW:         1.20 cm     LV E/e' medial:  19.7 LV IVS:        1.31 cm     LV e' lateral:   4.73 cm/s LVOT diam:     2.00 cm     LV E/e' lateral: 12.0 LV SV:         58 LV SV Index:   31          2D Longitudinal Strain LVOT Area:     3.14 cm    2D Strain GLS Avg:     -13.4 %  LV Volumes (MOD) LV vol d, MOD A2C: 36.1 ml 3D Volume EF: LV vol d, MOD A4C: 50.7 ml 3D EF:        82 % LV vol s, MOD A2C: 15.7 ml LV EDV:       235 ml LV vol s, MOD A4C: 28.7 ml LV ESV:       43 ml LV SV MOD A2C:     20.4 ml LV SV:        192 ml LV SV MOD A4C:     50.7 ml LV SV MOD BP:      24.2 ml RIGHT VENTRICLE TAPSE (M-mode): 1.5 cm  PULMONARY VEINS                         A Reversal Duration: 106.00 msec                         A Reversal Velocity: 24.40 cm/s                         Diastolic Velocity:  25.30 cm/s                         S/D Velocity:        1.90                         Systolic Velocity:   47.10 cm/s LEFT ATRIUM             Index       RIGHT ATRIUM  Index LA diam:        3.50 cm 1.90 cm/m  RA Area:     29.80 cm LA Vol (A2C):   63.0 ml 34.11 ml/m RA Volume:   117.00 ml 63.35 ml/m LA Vol (A4C):   40.7 ml 22.04 ml/m LA Biplane Vol: 52.3 ml 28.32 ml/m  AORTIC VALVE                   PULMONIC VALVE AV Area (Vmax):    2.65 cm    PV Vmax:       0.80 m/s AV Area (Vmean):   2.44 cm    PV Vmean:      61.000 cm/s AV Area (VTI):     2.42 cm    PV VTI:        0.215 m AV Vmax:           121.00 cm/s PV Peak grad:  2.6 mmHg AV  Vmean:          83.300 cm/s PV Mean grad:  2.0 mmHg AV VTI:            0.239 m AV Peak Grad:      5.9 mmHg AV Mean Grad:      3.0 mmHg LVOT Vmax:         102.00 cm/s LVOT Vmean:        64.600 cm/s LVOT VTI:          0.184 m LVOT/AV VTI ratio: 0.77 AI PHT:            597 msec  AORTA Ao Root diam: 3.30 cm Ao Asc diam:  3.20 cm MITRAL VALVE                TRICUSPID VALVE MV Area (PHT): 2.66 cm     TR Peak grad:   73.6 mmHg MV Decel Time: 285 msec     TR Vmax:        429.00 cm/s MV E velocity: 56.80 cm/s MV A velocity: 101.00 cm/s  SHUNTS MV E/A ratio:  0.56         Systemic VTI:  0.18 m                             Systemic Diam: 2.00 cm Lennie Odor MD Electronically signed by Lennie Odor MD Signature Date/Time: 08/10/2020/12:04:30 PM    Final    VAS [place] LOWER EXTREMITY VENOUS (DVT)  Result Date: 08/11/2020  Lower Venous DVT Study Indications: Swelling, and pulmonary embolism.  Risk Factors: None identified. Anticoagulation: Heparin. Comparison Study: No previous Performing Technologist: Clint Guy RVT  Examination Guidelines: A complete evaluation includes B-mode imaging, spectral Doppler, color Doppler, and power Doppler as needed of all accessible portions of each vessel. Bilateral testing is considered an integral part of a complete examination. Limited examinations for reoccurring indications may be performed as noted. The reflux portion of the exam is performed with the patient in reverse Trendelenburg.  +---------+---------------+---------+-----------+----------+--------------+ RIGHT    CompressibilityPhasicitySpontaneityPropertiesThrombus Aging +---------+---------------+---------+-----------+----------+--------------+ CFV      Full           Yes      Yes                                 +---------+---------------+---------+-----------+----------+--------------+ SFJ      Full                                                         +---------+---------------+---------+-----------+----------+--------------+  FV Prox  Full                                                        +---------+---------------+---------+-----------+----------+--------------+ FV Mid   Full                                                        +---------+---------------+---------+-----------+----------+--------------+ FV DistalFull                                                        +---------+---------------+---------+-----------+----------+--------------+ PFV      Full                                                        +---------+---------------+---------+-----------+----------+--------------+ POP      Full           Yes      Yes                                 +---------+---------------+---------+-----------+----------+--------------+ PTV      Full                                                        +---------+---------------+---------+-----------+----------+--------------+ PERO     Full                                                        +---------+---------------+---------+-----------+----------+--------------+   +---------+---------------+---------+-----------+----------+--------------+ LEFT     CompressibilityPhasicitySpontaneityPropertiesThrombus Aging +---------+---------------+---------+-----------+----------+--------------+ CFV      Full           Yes      Yes                                 +---------+---------------+---------+-----------+----------+--------------+ SFJ      Full                                                        +---------+---------------+---------+-----------+----------+--------------+ FV Prox  Full                                                        +---------+---------------+---------+-----------+----------+--------------+  FV Mid   Full                                                         +---------+---------------+---------+-----------+----------+--------------+ FV DistalFull                                                        +---------+---------------+---------+-----------+----------+--------------+ PFV      Full                                                        +---------+---------------+---------+-----------+----------+--------------+ POP      Full           Yes      Yes                                 +---------+---------------+---------+-----------+----------+--------------+ PTV      Full                                                        +---------+---------------+---------+-----------+----------+--------------+ PERO     Full                                                        +---------+---------------+---------+-----------+----------+--------------+  Summary: BILATERAL: - No evidence of deep vein thrombosis seen in the lower extremities, bilaterally. - No evidence of superficial venous thrombosis in the lower extremities, bilaterally. - RIGHT: - A cystic structure is found in the popliteal fossa.  LEFT: - No cystic structure found in the popliteal fossa.  *See table(s) above for measurements and observations. Electronically signed by Lemar Livings MD on 08/11/2020 at 5:59:23 PM.    Final     ASSESSMENT & PLAN Lynn Brown is a 87 y.o. female presenting to the clinic for evaluation after recent diagnosis of submassive pulmonary emboli involving the right lung. Patient is accompanied by her daughter for this visit.   Reviewed risk factors that can provoke a DVT/PE including recent travel, infectious/inflammatory etiologies, hormone therapy, surgery and pregnancy. Patient does not exhibit any risk factors that contributed to her recent PE so recommendation is indefinite anticoagulation. Recommend further with hypercoagulable panel, CBC with differential and CMP.   #Pulmonary emboli in the right lung --CTA chest from 08/09/2020 revealed  extensive pulmonary emboli within the right lung.  --Echo from 08/10/2020 :EF 55-60%, RVSP 77 mmHg-RV systolic function mildly reduced. --Bilateral LE doppler US from 3/16/022: No DVT --Currently on Eliquis with good toleration. Sent refill today.  --Based on review of records, history and physical; this is consistent with an unprovoked event so recommend indefinite anticoagulation.  --  Has family history of DVT/PE including sister and niece.  --Recommend further workup with hypercoagulable panel, CBC with differential and CMP.  --RTC in 3 months with repeat labs.   #Peripheral artery disease --Underwent SFA/popliteal artery stenting on 04/2020 --Vascular surgery recommend to stop Plavix and continue with aspirin.    Orders Placed This Encounter  Procedures  . Antithrombin III  . Protein C activity  . Protein C, total  . Protein S activity  . Protein S, total  . Lupus anticoagulant panel  . Beta-2-glycoprotein i abs, IgG/M/A  . Homocysteine, serum  . Factor 5 leiden  . Prothrombin gene mutation  . Cardiolipin antibodies, IgG, IgM, IgA  . CBC with Differential (Cancer Center Only)    Standing Status:   Future    Number of Occurrences:   1    Standing Expiration Date:   08/17/2021  . CMP (Cancer Center only)    Standing Status:   Future    Number of Occurrences:   1    Standing Expiration Date:   08/17/2021    All questions were answered. The patient knows to call the clinic with any problems, questions or concerns.  A total of more than 60 minutes were spent on this encounter and over half of that time was spent on counseling and coordination of care as outlined above.    Georga Kaufmann, PA-C Department of Hematology/Oncology [facility] Phone: [phone]

## 2020-08-17 ENCOUNTER — Encounter: Payer: Self-pay | Admitting: Physician Assistant

## 2020-08-17 ENCOUNTER — Other Ambulatory Visit: Payer: Self-pay

## 2020-08-17 ENCOUNTER — Inpatient Hospital Stay: Payer: Medicare Other

## 2020-08-17 ENCOUNTER — Inpatient Hospital Stay: Payer: Medicare Other | Attending: Physician Assistant | Admitting: Physician Assistant

## 2020-08-17 VITALS — BP 144/78 | HR 68 | Temp 97.0°F | Resp 20 | Ht 61.0 in | Wt 190.8 lb

## 2020-08-17 DIAGNOSIS — Z807 Family history of other malignant neoplasms of lymphoid, hematopoietic and related tissues: Secondary | ICD-10-CM | POA: Insufficient documentation

## 2020-08-17 DIAGNOSIS — Z8042 Family history of malignant neoplasm of prostate: Secondary | ICD-10-CM | POA: Diagnosis not present

## 2020-08-17 DIAGNOSIS — I10 Essential (primary) hypertension: Secondary | ICD-10-CM | POA: Diagnosis not present

## 2020-08-17 DIAGNOSIS — Z7901 Long term (current) use of anticoagulants: Secondary | ICD-10-CM | POA: Diagnosis not present

## 2020-08-17 DIAGNOSIS — I2699 Other pulmonary embolism without acute cor pulmonale: Secondary | ICD-10-CM | POA: Diagnosis not present

## 2020-08-17 DIAGNOSIS — Z800 Family history of malignant neoplasm of digestive organs: Secondary | ICD-10-CM

## 2020-08-17 DIAGNOSIS — Z8249 Family history of ischemic heart disease and other diseases of the circulatory system: Secondary | ICD-10-CM

## 2020-08-17 DIAGNOSIS — I739 Peripheral vascular disease, unspecified: Secondary | ICD-10-CM | POA: Insufficient documentation

## 2020-08-17 LAB — CBC WITH DIFFERENTIAL (CANCER CENTER ONLY)
Abs Immature Granulocytes: 0 10*3/uL (ref 0.00–0.07)
Basophils Absolute: 0 10*3/uL (ref 0.0–0.1)
Basophils Relative: 1 %
Eosinophils Absolute: 0.1 10*3/uL (ref 0.0–0.5)
Eosinophils Relative: 2 %
HCT: 43.1 % (ref 36.0–46.0)
Hemoglobin: 13.9 g/dL (ref 12.0–15.0)
Immature Granulocytes: 0 %
Lymphocytes Relative: 34 %
Lymphs Abs: 1.3 10*3/uL (ref 0.7–4.0)
MCH: 30.3 pg (ref 26.0–34.0)
MCHC: 32.3 g/dL (ref 30.0–36.0)
MCV: 93.9 fL (ref 80.0–100.0)
Monocytes Absolute: 0.3 10*3/uL (ref 0.1–1.0)
Monocytes Relative: 9 %
Neutro Abs: 2 10*3/uL (ref 1.7–7.7)
Neutrophils Relative %: 54 %
Platelet Count: 222 10*3/uL (ref 150–400)
RBC: 4.59 MIL/uL (ref 3.87–5.11)
RDW: 14 % (ref 11.5–15.5)
WBC Count: 3.7 10*3/uL — ABNORMAL LOW (ref 4.0–10.5)
nRBC: 0 % (ref 0.0–0.2)

## 2020-08-17 LAB — ANTITHROMBIN III: AntiThromb III Func: 107 % (ref 75–120)

## 2020-08-17 LAB — CMP (CANCER CENTER ONLY)
ALT: 20 U/L (ref 0–44)
AST: 17 U/L (ref 15–41)
Albumin: 4 g/dL (ref 3.5–5.0)
Alkaline Phosphatase: 74 U/L (ref 38–126)
Anion gap: 10 (ref 5–15)
BUN: 16 mg/dL (ref 8–23)
CO2: 26 mmol/L (ref 22–32)
Calcium: 9.5 mg/dL (ref 8.9–10.3)
Chloride: 106 mmol/L (ref 98–111)
Creatinine: 1.04 mg/dL — ABNORMAL HIGH (ref 0.44–1.00)
GFR, Estimated: 52 mL/min — ABNORMAL LOW (ref >60)
Glucose, Bld: 108 mg/dL — ABNORMAL HIGH (ref 70–99)
Potassium: 5.3 mmol/L — ABNORMAL HIGH (ref 3.5–5.1)
Sodium: 142 mmol/L (ref 135–145)
Total Bilirubin: 0.6 mg/dL (ref 0.3–1.2)
Total Protein: 8 g/dL (ref 6.5–8.1)

## 2020-08-17 MED ORDER — APIXABAN 5 MG PO TABS: 5.0000 mg | ORAL_TABLET | Freq: Two times a day (BID) | ORAL | 3 refills | Status: DC

## 2020-08-18 ENCOUNTER — Telehealth: Payer: Self-pay | Admitting: Physician Assistant

## 2020-08-18 DIAGNOSIS — R269 Unspecified abnormalities of gait and mobility: Secondary | ICD-10-CM | POA: Diagnosis not present

## 2020-08-18 DIAGNOSIS — M179 Osteoarthritis of knee, unspecified: Secondary | ICD-10-CM | POA: Diagnosis not present

## 2020-08-18 DIAGNOSIS — I2693 Single subsegmental pulmonary embolism without acute cor pulmonale: Secondary | ICD-10-CM | POA: Diagnosis not present

## 2020-08-18 DIAGNOSIS — M6281 Muscle weakness (generalized): Secondary | ICD-10-CM | POA: Diagnosis not present

## 2020-08-18 DIAGNOSIS — I10 Essential (primary) hypertension: Secondary | ICD-10-CM | POA: Diagnosis not present

## 2020-08-18 LAB — PROTEIN S, TOTAL: Protein S Ag, Total: 140 % (ref 60–150)

## 2020-08-18 LAB — PROTEIN S ACTIVITY: Protein S Activity: 101 % (ref 63–140)

## 2020-08-18 LAB — PROTEIN C, TOTAL: Protein C, Total: 118 % (ref 60–150)

## 2020-08-18 LAB — PROTEIN C ACTIVITY: Protein C Activity: 154 % (ref 73–180)

## 2020-08-18 LAB — HOMOCYSTEINE: Homocysteine: 16.2 umol/L (ref 0.0–21.3)

## 2020-08-18 NOTE — Telephone Encounter (Signed)
Scheduled follow-up appointment per 3/23 los. Unable to leave voicemail. Mailed calendar.

## 2020-08-19 ENCOUNTER — Telehealth: Payer: Self-pay | Admitting: *Deleted

## 2020-08-19 LAB — LUPUS ANTICOAGULANT PANEL
DRVVT: 89.3 s — ABNORMAL HIGH (ref 0.0–47.0)
PTT Lupus Anticoagulant: 39.4 s (ref 0.0–51.9)

## 2020-08-19 LAB — CARDIOLIPIN ANTIBODIES, IGG, IGM, IGA
Anticardiolipin IgA: <9 APL U/mL (ref 0–11)
Anticardiolipin IgG: 9 GPL U/mL (ref 0–14)
Anticardiolipin IgM: 34 MPL U/mL — ABNORMAL HIGH (ref 0–12)

## 2020-08-19 LAB — BETA-2-GLYCOPROTEIN I ABS, IGG/M/A
Beta-2 Glyco I IgG: <9 GPI IgG units (ref 0–20)
Beta-2-Glycoprotein I IgA: <9 GPI IgA units (ref 0–25)
Beta-2-Glycoprotein I IgM: 11 GPI IgM units (ref 0–32)

## 2020-08-19 LAB — DRVVT CONFIRM: dRVVT Confirm: 1.2 ratio (ref 0.8–1.2)

## 2020-08-19 LAB — DRVVT MIX: dRVVT Mix: 64 s — ABNORMAL HIGH (ref 0.0–40.4)

## 2020-08-19 NOTE — Telephone Encounter (Signed)
Prolia insurance verification has been sent awaiting Summary of benefits  

## 2020-08-22 DIAGNOSIS — Z86711 Personal history of pulmonary embolism: Secondary | ICD-10-CM | POA: Diagnosis not present

## 2020-08-22 DIAGNOSIS — I10 Essential (primary) hypertension: Secondary | ICD-10-CM | POA: Diagnosis not present

## 2020-08-22 DIAGNOSIS — Z9289 Personal history of other medical treatment: Secondary | ICD-10-CM | POA: Diagnosis not present

## 2020-08-23 ENCOUNTER — Telehealth: Payer: Self-pay | Admitting: Physician Assistant

## 2020-08-23 ENCOUNTER — Ambulatory Visit (INDEPENDENT_AMBULATORY_CARE_PROVIDER_SITE_OTHER): Payer: Medicare Other | Admitting: Ophthalmology

## 2020-08-23 ENCOUNTER — Encounter (INDEPENDENT_AMBULATORY_CARE_PROVIDER_SITE_OTHER): Payer: Self-pay | Admitting: Ophthalmology

## 2020-08-23 ENCOUNTER — Other Ambulatory Visit: Payer: Self-pay

## 2020-08-23 DIAGNOSIS — H33101 Unspecified retinoschisis, right eye: Secondary | ICD-10-CM

## 2020-08-23 DIAGNOSIS — M6281 Muscle weakness (generalized): Secondary | ICD-10-CM | POA: Diagnosis not present

## 2020-08-23 DIAGNOSIS — I10 Essential (primary) hypertension: Secondary | ICD-10-CM | POA: Diagnosis not present

## 2020-08-23 DIAGNOSIS — H43821 Vitreomacular adhesion, right eye: Secondary | ICD-10-CM | POA: Diagnosis not present

## 2020-08-23 DIAGNOSIS — R269 Unspecified abnormalities of gait and mobility: Secondary | ICD-10-CM | POA: Diagnosis not present

## 2020-08-23 DIAGNOSIS — I2693 Single subsegmental pulmonary embolism without acute cor pulmonale: Secondary | ICD-10-CM | POA: Diagnosis not present

## 2020-08-23 DIAGNOSIS — M179 Osteoarthritis of knee, unspecified: Secondary | ICD-10-CM | POA: Diagnosis not present

## 2020-08-23 NOTE — Patient Instructions (Signed)
Patient instructed to complete her eye medications at the end of this week, August 26, 2020

## 2020-08-23 NOTE — Assessment & Plan Note (Signed)
3-week status post vitrectomy for VMT with secondary foveal macular schisis.  Anatomy has improved nicely.  Acuity has improved slightly at this early time.  10 you to observe

## 2020-08-23 NOTE — Assessment & Plan Note (Signed)
Improving 3 weeks post vitrectomy for VMT triggering this matter

## 2020-08-23 NOTE — Progress Notes (Signed)
08/23/2020     CHIEF COMPLAINT Patient presents for Post-op Follow-up (3 Week s\p OD OCT/Pt states OD is doing okay. Denies ocular pain/discomfort. Using gtts as directed.)   HISTORY OF PRESENT ILLNESS: Lynn Brown is a 87 y.o. female who presents to the clinic today for:   HPI    Post-op Follow-up    In right eye.  Discomfort includes none.  Vision is stable.  I, the attending physician,  performed the HPI with the patient and updated documentation appropriately. Additional comments: 3 Week s\p OD OCT Pt states OD is doing okay. Denies ocular pain/discomfort. Using gtts as directed.       Last edited by Edmon Crape, MD on 08/23/2020 11:19 AM. (History)      Referring physician: Laurann Montana, MD [phone] [address] [place],  [place] [address]  HISTORICAL INFORMATION:   Selected notes from the MEDICAL RECORD NUMBER       CURRENT MEDICATIONS: No current outpatient medications on file. (Ophthalmic Drugs)   No current facility-administered medications for this visit. (Ophthalmic Drugs)   Current Outpatient Medications (Other)  Medication Sig  . Acetaminophen 500 MG capsule   . apixaban (ELIQUIS) 5 MG TABS tablet Take 1 tablet (5 mg total) by mouth 2 (two) times daily.  . APIXABAN (ELIQUIS) VTE STARTER PACK (10MG  AND 5MG ) Take as directed on package: start with two-5mg  tablets twice daily for 7 days. On day 8, switch to one-5mg  tablet twice daily.  ascorbic acid (VITAMIN C) 500 MG tablet Take 500 mg by mouth in the morning and at bedtime.  aspirin EC 81 MG tablet Take 1 tablet (81 mg total) by mouth daily. Swallow whole.  . Calcium Citrate-Vitamin D 315-250 MG-UNIT TABS Take 1 tablet by mouth daily.  . cetirizine (ZYRTEC) 10 MG tablet Take 10 mg by mouth daily as needed (allergies.).   Marland Kitchen Cholecalciferol (VITAMIN D) 2000 UNITS tablet Take 2,000 Units by mouth daily.  . clorazepate (TRANXENE) 7.5 MG tablet Take 7.5 mg by mouth daily as needed for anxiety.   .  Coenzyme Q10 (COQ10) 50 MG CAPS Take 50 mg by mouth daily at 2 PM.  . levothyroxine (SYNTHROID) 112 MCG tablet Take 112 mcg by mouth every morning.  . magnesium oxide (MAG-OX) 400 MG tablet Take 400 mg by mouth every evening.   . metoprolol tartrate (LOPRESSOR) 25 MG tablet Take 0.5 tablets (12.5 mg total) by mouth 2 (two) times daily.  . nitroGLYCERIN (NITROSTAT) 0.4 MG SL tablet Place 1 tablet (0.4 mg total) under the tongue every 5 (five) minutes as needed for chest pain. (Patient taking differently: Place 0.4 mg under the tongue every 5 (five) minutes x 3 doses as needed for chest pain.)  . rosuvastatin (CRESTOR) 20 MG tablet Take 20 mg by mouth every evening.   Marland Kitchen spironolactone (ALDACTONE) 25 MG tablet TAKE 1 TABLET BY MOUTH  DAILY (Patient taking differently: Take 25 mg by mouth daily.)   No current facility-administered medications for this visit. (Other)      REVIEW OF SYSTEMS:    ALLERGIES No Known Allergies  PAST MEDICAL HISTORY Past Medical History:  Diagnosis Date  . Allergic rhinitis   . Anxiety   . Bunion    PES PLANUS  . Hearing loss    HEARING AIDS, BUT DOES NOT WEAR THEM CONSISTENTLY.   Marland Kitchen Hypercholesteremia   . Hypertension   . Hypothyroid   . Leaky heart valve    DR. HARWANI  .  Mild obesity   . Osteoarthritis of knees, bilateral   . Osteoporosis 12/2017   T score -3.3  . Venous insufficiency    Past Surgical History:  Procedure Laterality Date  . ABDOMINAL AORTOGRAM W/LOWER EXTREMITY Left 05/04/2020   Procedure: ABDOMINAL AORTOGRAM W/LOWER EXTREMITY;  Surgeon: Leonie Douglas, MD;  Location: [facility];  Service: Cardiovascular;  Laterality: Left;  . CATARACT EXTRACTION, BILATERAL  2013   DR. SHAPIRO   . HYSTEROSCOPY     D & C  . LEFT HEART CATH AND CORONARY ANGIOGRAPHY N/A 03/10/2018   Procedure: LEFT HEART CATH AND CORONARY ANGIOGRAPHY;  Surgeon: Lyn Records, MD;  Location: [facility];  Service: Cardiovascular;  Laterality: N/A;   . NEPHRECTOMY LIVING DONOR  1978  . PERIPHERAL VASCULAR INTERVENTION Left 05/04/2020   Procedure: PERIPHERAL VASCULAR INTERVENTION;  Surgeon: Leonie Douglas, MD;  Location: [facility];  Service: Cardiovascular;  Laterality: Left;  Femoral popliteal    FAMILY HISTORY Family History  Problem Relation Age of Onset  . Hypertension Mother   . CVA Mother   . Diabetes Sister   . Hypertension Sister   . Heart disease Sister   . Multiple sclerosis Sister   . Hypertension Father   . CVA Father   . Cancer Brother        prostrate  . Hypertension Brother   . CVA Brother   . Seizures Brother   . Pancreatic cancer Sister   . Kidney disease Sister   . Kidney failure Sister   . Other Brother        PNEUMONIA AS A BABY  . Hypertension Brother   . Lymphoma Daughter   . Deep vein thrombosis Sister   . Pulmonary embolism Niece   . Breast cancer Neg Hx     SOCIAL HISTORY Social History   Tobacco Use  . Smoking status: Never Smoker  . Smokeless tobacco: Never Used  Vaping Use  . Vaping Use: Never used  Substance Use Topics  . Alcohol use: No    Alcohol/week: 0.0 standard drinks  . Drug use: No         OPHTHALMIC EXAM:  Base Eye Exam    Visual Acuity (Snellen - Linear)      Right Left   Dist cc 20/60 20/30 +1   Dist ph cc NI    Correction: Glasses       Tonometry (Tonopen, 10:39 AM)      Right Left   Pressure 14 13       Pupils      Pupils Dark Light Shape React APD   Right PERRL 4 3 Round Brisk None   Left PERRL 4 3 Round Brisk None       Neuro/Psych    Oriented x3: Yes   Mood/Affect: Normal       Dilation    Right eye: 1.0% Mydriacyl, 2.5% Phenylephrine @ 10:39 AM        Slit Lamp and Fundus Exam    External Exam      Right Left   External Normal Normal       Slit Lamp Exam      Right Left   Lids/Lashes Normal Normal   Conjunctiva/Sclera White and quiet White and quiet   Cornea Clear Clear   Anterior Chamber Deep and quiet Deep and  quiet   Iris Round and reactive Round and reactive   Lens Posterior chamber intraocular lens Posterior chamber intraocular  lens   Anterior Vitreous Normal Normal       Fundus Exam      Right Left   Posterior Vitreous Clear, avitric    Disc Normal    C/D Ratio 0.5    Macula Pseudocystoid CME, Appears resolved, negative Watzke    Vessels Normal    Periphery Normal           IMAGING AND PROCEDURES  Imaging and Procedures for 08/23/20  OCT, Retina - OU - Both Eyes       Right Eye Quality was good. Scan locations included subfoveal. Central Foveal Thickness: 261. Progression has improved.   Left Eye Quality was good. Scan locations included subfoveal. Central Foveal Thickness: 277. Progression has been stable. Findings include normal observations.   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  Anatomy has improved nicely.  Acuity has improved slightly at this early time.  10 you to observe  Macular retinoschisis, right Improving 3 weeks post vitrectomy for VMT triggering this matter      ICD-10-CM   1. Vitreomacular traction, right  H43.821 OCT, Retina - OU - Both Eyes  2. Macular retinoschisis, right  H33.101     1.  3 weeks post vitrectomy for VMT secondary macular retinoschisis right eye, will continue to observe for continued improvement and acuity stabilization and improvement.  2.  Patient instructed to discontinue her topical medications , On August 26, 2020 3.  Ophthalmic Meds Ordered this visit:  No orders of the defined types were placed in this encounter.      Return in about 6 months (around 02/23/2021) for DILATE OU, OCT.  Patient Instructions  Patient instructed to complete her eye medications at the end of this week, August 26, 2020    Explained the diagnoses, plan, and follow up with the patient and they expressed understanding.  Patient expressed understanding of the importance of proper follow up care.   Alford Highland Camden Mazzaferro M.D. Diseases & Surgery of the Retina and Vitreous [facility] 08/23/20     Abbreviations: M myopia (nearsighted); A astigmatism; H hyperopia (farsighted); P presbyopia; Mrx spectacle prescription;  CTL contact lenses; OD right eye; OS left eye; OU both eyes  XT exotropia; ET esotropia; PEK punctate epithelial keratitis; PEE punctate epithelial erosions; DES dry eye syndrome; MGD meibomian gland dysfunction; ATs artificial tears; PFAT's preservative free artificial tears; NSC nuclear sclerotic cataract; PSC posterior subcapsular cataract; ERM epi-retinal membrane; PVD posterior vitreous detachment; RD retinal detachment; DM diabetes mellitus; DR diabetic retinopathy; NPDR non-proliferative diabetic retinopathy; PDR proliferative diabetic retinopathy; CSME clinically significant macular edema; DME diabetic macular edema; dbh dot blot hemorrhages; CWS cotton wool spot; POAG primary open angle glaucoma; C/D cup-to-disc ratio; HVF humphrey visual field; GVF goldmann visual field; OCT optical coherence tomography; IOP intraocular pressure; BRVO Branch retinal vein occlusion; CRVO central retinal vein occlusion; CRAO central retinal artery occlusion; BRAO branch retinal artery occlusion; RT retinal tear; SB scleral buckle; PPV pars plana vitrectomy; VH Vitreous hemorrhage; PRP panretinal laser photocoagulation; IVK intravitreal kenalog; VMT vitreomacular traction; MH Macular hole;  NVD neovascularization of the disc; NVE neovascularization elsewhere; AREDS age related eye disease study; ARMD age related macular degeneration; POAG primary open angle glaucoma; EBMD epithelial/anterior basement membrane dystrophy; ACIOL  anterior chamber intraocular lens; IOL intraocular lens; PCIOL posterior chamber  intraocular lens; Phaco/IOL phacoemulsification with intraocular lens placement; [place] photorefractive keratectomy; LASIK laser assisted in situ keratomileusis; HTN hypertension; DM diabetes mellitus; COPD chronic obstructive pulmonary disease

## 2020-08-23 NOTE — Telephone Encounter (Signed)
Patient called Lynn Brown inquiring about lab results from 08/17/20. I called patient back and explained that I am still waiting on two additional lab results including factor 5 leiden an prothrombin gene mutation. The rest of the labs did not indicate a hematologic disorder than caused her pulmonary emboli. Since the PE was unprovoked, the recommendation is indefinite anticoagulation with Eliquis.   I will call patient back if the remaining labs indicate further workup. Patient is scheduled to return to the clinic in 3 months with repeat labs.   Patient expressed understanding and satisfaction with the plan provided.

## 2020-08-25 DIAGNOSIS — I10 Essential (primary) hypertension: Secondary | ICD-10-CM | POA: Diagnosis not present

## 2020-08-25 DIAGNOSIS — I2693 Single subsegmental pulmonary embolism without acute cor pulmonale: Secondary | ICD-10-CM | POA: Diagnosis not present

## 2020-08-25 DIAGNOSIS — M179 Osteoarthritis of knee, unspecified: Secondary | ICD-10-CM | POA: Diagnosis not present

## 2020-08-25 DIAGNOSIS — M6281 Muscle weakness (generalized): Secondary | ICD-10-CM | POA: Diagnosis not present

## 2020-08-25 DIAGNOSIS — R269 Unspecified abnormalities of gait and mobility: Secondary | ICD-10-CM | POA: Diagnosis not present

## 2020-08-25 LAB — FACTOR 5 LEIDEN

## 2020-08-25 LAB — PROTHROMBIN GENE MUTATION

## 2020-08-30 DIAGNOSIS — I10 Essential (primary) hypertension: Secondary | ICD-10-CM | POA: Diagnosis not present

## 2020-08-30 DIAGNOSIS — I2693 Single subsegmental pulmonary embolism without acute cor pulmonale: Secondary | ICD-10-CM | POA: Diagnosis not present

## 2020-08-30 DIAGNOSIS — R269 Unspecified abnormalities of gait and mobility: Secondary | ICD-10-CM | POA: Diagnosis not present

## 2020-08-30 DIAGNOSIS — M6281 Muscle weakness (generalized): Secondary | ICD-10-CM | POA: Diagnosis not present

## 2020-08-30 DIAGNOSIS — M179 Osteoarthritis of knee, unspecified: Secondary | ICD-10-CM | POA: Diagnosis not present

## 2020-09-01 ENCOUNTER — Encounter: Payer: Self-pay | Admitting: Pulmonary Disease

## 2020-09-01 ENCOUNTER — Other Ambulatory Visit: Payer: Self-pay

## 2020-09-01 ENCOUNTER — Ambulatory Visit: Payer: Medicare Other | Admitting: Pulmonary Disease

## 2020-09-01 VITALS — BP 118/74 | HR 74 | Temp 97.2°F | Ht 63.0 in | Wt 188.4 lb

## 2020-09-01 DIAGNOSIS — I2694 Multiple subsegmental pulmonary emboli without acute cor pulmonale: Secondary | ICD-10-CM | POA: Diagnosis not present

## 2020-09-01 NOTE — Progress Notes (Signed)
@Patient  ID: , female    DOB: [birth year], 87 y.o.   MRN: [id number]  Chief Complaint  Patient presents with  . Consult    Doing better, breathing is getting better slowly, winded easily    Referring provider: [id number], MD  HPI:   87 year old whom we are seeing in consultation for evaluation of submassive PE.  Most recent PCP note reviewed.  Discharge summary from recent hospitalization for pulmonary embolus reviewed.  Patient was in usual state of health.  She reported about a 1 week history of worsening dyspnea on exertion.  This progressed to severe.  Had dyspnea or shortness of breath at rest as well.  Notably she had a left SFA/popliteal artery stent placed 04/2020.  She presented to the ED.  There CTA was performed of the chest.  On my interpretation and review this revealed large clot burden with clear lungs bilaterally with the exception of linear atelectasis in the left lower lobe at the base.  Radiology read comments on RV strain.  Echocardiogram was obtained and hospitalization reviewed which did demonstrate RV dysfunction.  She was placed on anticoagulation.  Discharged on oral anticoagulation.  Overall her symptoms of shortness of breath, dyspnea exertion are greatly improved.  Markedly better than what brought her to the hospital.  Still have some residual dyspnea on exertion.  Note she has been less active the last few weeks.  She feels like she is weak, working on getting her strength back.  PMH: PE 07/2020, hyperlipidemia, seasonal allergies, hypothyroid Surgical history: Cataract surgery, living donor kidney surgery, PAD intervention 04/2020 with stent Family history: Mother with hypertension, CVA, father with hypertension and CVA Social history: Never smoker, retired, lives in [place] / Pulmonary Flowsheets:   ACT:  No flowsheet data found.  MMRC: No flowsheet data found.  Epworth:  No flowsheet data found.  Tests:   FENO:   No results found for: NITRICOXIDE  PFT: No flowsheet data found.  WALK:  SIX MIN WALK 10/22/2019 08/17/2019  2 Minute Oxygen Saturation % 96 91  2 Minute HR 94 64  4 Minute Oxygen Saturation % 94 91  4 Minute HR 108 67  6 Minute Oxygen Saturation % 93 93  6 Minute HR 113 86    Imaging: Personally reviewed and as per EMR and discussion of this note No results found.  Lab Results: Personally reviewed, no anemia CBC    Component Value Date/Time   WBC 3.7 (L) 08/17/2020 1015   WBC 4.9 08/11/2020 0135   RBC 4.59 08/17/2020 1015   HGB 13.9 08/17/2020 1015   HGB 12.8 09/25/2017 0943   HCT 43.1 08/17/2020 1015   HCT 37.8 09/25/2017 0943   PLT 222 08/17/2020 1015   PLT 180 09/25/2017 0943   MCV 93.9 08/17/2020 1015   MCV 95 09/25/2017 0943   MCH 30.3 08/17/2020 1015   MCHC 32.3 08/17/2020 1015   RDW 14.0 08/17/2020 1015   RDW 14.0 09/25/2017 0943   LYMPHSABS 1.3 08/17/2020 1015   MONOABS 0.3 08/17/2020 1015   EOSABS 0.1 08/17/2020 1015   BASOSABS 0.0 08/17/2020 1015    BMET    Component Value Date/Time   NA 142 08/17/2020 1015   NA 142 07/04/2018 1404   K 5.3 (H) 08/17/2020 1015   CL 106 08/17/2020 1015   CO2 26 08/17/2020 1015   GLUCOSE 108 (H) 08/17/2020 1015   BUN 16 08/17/2020 1015   BUN 19 07/04/2018 1404  CREATININE 1.04 (H) 08/17/2020 1015   CALCIUM 9.5 08/17/2020 1015   GFRNONAA 52 (L) 08/17/2020 1015   GFRAA 58 (L) 07/04/2018 1404    BNP    Component Value Date/Time   BNP 85.9 08/09/2020 1041    ProBNP    Component Value Date/Time   PROBNP 43 09/25/2017 0943   PROBNP 130.3 04/07/2013 1824    Specialty Problems      Pulmonary Problems   SOB (shortness of breath)      No Known Allergies  Immunization History  Administered Date(s) Administered  . Influenza Split 04/26/2008, 05/02/2010, 03/06/2016, 03/07/2018  . Influenza, High Dose Seasonal PF 03/07/2018  . Influenza-Unspecified 06/06/2011, 02/23/2013, 02/24/2014, 04/11/2015,  02/22/2017, 01/21/2019  . PFIZER(Purple Top)SARS-COV-2 Vaccination 06/27/2019, 07/18/2019  . Pneumococcal Conjugate-13 08/25/2013, 04/03/2016  . Pneumococcal Polysaccharide-23 01/10/2009  . Td 01/10/2009  . Tdap 04/11/2015  . Zoster 12/13/2010, 11/05/2017, 01/23/2018  . Zoster Recombinat (Shingrix) 11/05/2017    Past Medical History:  Diagnosis Date  . Allergic rhinitis   . Anxiety   . Bunion    PES PLANUS  . Hearing loss    HEARING AIDS, BUT DOES NOT WEAR THEM CONSISTENTLY.   Marland Kitchen Hypercholesteremia   . Hypertension   . Hypothyroid   . Leaky heart valve    DR. HARWANI  . Mild obesity   . Osteoarthritis of knees, bilateral   . Osteoporosis 12/2017   T score -3.3  . Venous insufficiency     Tobacco History: Social History   Tobacco Use  Smoking Status Never Smoker  Smokeless Tobacco Never Used   Counseling given: Yes   Continue to not smoke  Outpatient Encounter Medications as of 09/01/2020  Medication Sig  . Acetaminophen 500 MG capsule Take 500 mg by mouth every 6 (six) hours as needed for fever or pain.  Marland Kitchen apixaban (ELIQUIS) 5 MG TABS tablet Take 1 tablet (5 mg total) by mouth 2 (two) times daily.  Marland Kitchen ascorbic acid (VITAMIN C) 500 MG tablet Take 500 mg by mouth in the morning and at bedtime.  Marland Kitchen aspirin EC 81 MG tablet Take 1 tablet (81 mg total) by mouth daily. Swallow whole.  . Calcium Citrate-Vitamin D 315-250 MG-UNIT TABS Take 1 tablet by mouth daily.  . cetirizine (ZYRTEC) 10 MG tablet Take 10 mg by mouth daily as needed (allergies.).   Marland Kitchen Cholecalciferol (VITAMIN D) 2000 UNITS tablet Take 2,000 Units by mouth daily.  . clorazepate (TRANXENE) 7.5 MG tablet Take 7.5 mg by mouth daily as needed for anxiety.   . Coenzyme Q10 (COQ10) 50 MG CAPS Take 50 mg by mouth daily at 2 PM.  . levothyroxine (SYNTHROID) 112 MCG tablet Take 112 mcg by mouth every morning.  . magnesium oxide (MAG-OX) 400 MG tablet Take 400 mg by mouth every evening.   . metoprolol tartrate  (LOPRESSOR) 25 MG tablet Take 0.5 tablets (12.5 mg total) by mouth 2 (two) times daily.  . nitroGLYCERIN (NITROSTAT) 0.4 MG SL tablet Place 1 tablet (0.4 mg total) under the tongue every 5 (five) minutes as needed for chest pain.  . rosuvastatin (CRESTOR) 20 MG tablet Take 20 mg by mouth every evening.   Marland Kitchen spironolactone (ALDACTONE) 25 MG tablet TAKE 1 TABLET BY MOUTH  DAILY  . [DISCONTINUED] Acetaminophen 500 MG capsule  (Patient not taking: Reported on 09/28/2020)  . [DISCONTINUED] APIXABAN (ELIQUIS) VTE STARTER PACK (10MG  AND 5MG ) Take as directed on package: start with two-5mg  tablets twice daily for 7 days. On day 8, switch  to one-5mg  tablet twice daily. (Patient not taking: Reported on 09/28/2020)  . [DISCONTINUED] APIXABAN (ELIQUIS) VTE STARTER PACK (10MG  AND 5MG ) TAKE AS DIRECTED ON PACKAGE: START WITH 2 TABLETS (5MG ) BY MOUTH TWICE DAILY FOR 7 DAYS. ON DAY 8, SWITCH TO 1 TABLET (5MG ) BY MOUTH TWICE DAILY. (Patient not taking: Reported on 09/28/2020)  . [DISCONTINUED] ofloxacin (FLOXIN) 0.3 % OTIC solution 10 drops into affected ear (Patient not taking: Reported on 09/28/2020)  . [DISCONTINUED] prednisoLONE acetate (PRED FORTE) 1 % ophthalmic suspension 1 drop into affected eye (Patient not taking: Reported on 09/28/2020)   No facility-administered encounter medications on file as of 09/01/2020.     Review of Systems  Review of Systems  No chest pain on exertion.  No orthopnea or PND.  No worsening lower extremity swelling.  Comprehensive review of systems otherwise negative. Physical Exam  BP 118/74 (BP Location: Left Arm, Cuff Size: Normal)   Pulse 74   Temp (!) 97.2 F (36.2 C) (Oral)   Ht 5\' 3"  (1.6 m)   Wt 188 lb 6.4 oz (85.5 kg)   SpO2 98%   BMI 33.37 kg/m   Wt Readings from Last 5 Encounters:  09/28/20 187 lb 9.6 oz (85.1 kg)  09/01/20 188 lb 6.4 oz (85.5 kg)  08/17/20 190 lb 12.8 oz (86.5 kg)  08/09/20 189 lb 11.2 oz (86 kg)  06/30/20 190 lb 6.4 oz (86.4 kg)    BMI Readings  from Last 5 Encounters:  09/28/20 30.28 kg/m  09/01/20 33.37 kg/m  08/17/20 36.05 kg/m  08/09/20 35.84 kg/m  06/30/20 37.18 kg/m     Physical Exam General: Sitting in chair, no acute distress Eyes: EOMI, no icterus Neck: Supple, no JVP Cardiovascular: Regular rate and rhythm, no murmurs Pulmonary: Clear to auscultation bilaterally, normal work of breathing Abdomen: Nondistended, bowel sounds present MSK: No synovitis, joint effusion Neuro: Normal gait, no weakness Psych: Normal mood, full affect   Assessment & Plan:   Submassive PE: Appears likely unprovoked.  Symptoms improving with anticoagulation.  To continue for the foreseeable future.  Referral to hematology is been placed with which I agreed to help decide length of anticoagulation.  Repeat TTE at 3 months after PE ordered to evaluate RV and if persistent dysfunction remains.  Given improvement in symptoms I am optimistic that RV function will be improved.  Dyspnea on exertion: Improving, initially related to PE.  She feels weak, out of shape.  Suspect element of deconditioning with ongoing symptoms less so residual PE.  Recommend she continue to try to be active.   Return in about 10 weeks (around 11/10/2020).   11/01/20, MD 10/05/2020

## 2020-09-01 NOTE — Patient Instructions (Signed)
Is nice to meet you  Our hematology or blood doctors will help [place] decide how long to stay on the Eliquis or blood thinner.  I have ordered a repeat heart ultrasound for June 2022 to check on the stress that was put on the heart from a blood clot.  I expect this will be improved.  We will meet shortly after the ultrasound has been performed to discuss the results.  Given you feel improved, I am optimistic that the stress on the heart will be much improved.  Return to clinic in 10 weeks for follow-up with Dr. Judeth Horn after echocardiogram performed

## 2020-09-02 DIAGNOSIS — R269 Unspecified abnormalities of gait and mobility: Secondary | ICD-10-CM | POA: Diagnosis not present

## 2020-09-02 DIAGNOSIS — M179 Osteoarthritis of knee, unspecified: Secondary | ICD-10-CM | POA: Diagnosis not present

## 2020-09-02 DIAGNOSIS — M6281 Muscle weakness (generalized): Secondary | ICD-10-CM | POA: Diagnosis not present

## 2020-09-02 DIAGNOSIS — I2693 Single subsegmental pulmonary embolism without acute cor pulmonale: Secondary | ICD-10-CM | POA: Diagnosis not present

## 2020-09-02 DIAGNOSIS — I10 Essential (primary) hypertension: Secondary | ICD-10-CM | POA: Diagnosis not present

## 2020-09-05 DIAGNOSIS — I10 Essential (primary) hypertension: Secondary | ICD-10-CM | POA: Diagnosis not present

## 2020-09-05 DIAGNOSIS — I2693 Single subsegmental pulmonary embolism without acute cor pulmonale: Secondary | ICD-10-CM | POA: Diagnosis not present

## 2020-09-05 DIAGNOSIS — R269 Unspecified abnormalities of gait and mobility: Secondary | ICD-10-CM | POA: Diagnosis not present

## 2020-09-05 DIAGNOSIS — M6281 Muscle weakness (generalized): Secondary | ICD-10-CM | POA: Diagnosis not present

## 2020-09-05 DIAGNOSIS — M179 Osteoarthritis of knee, unspecified: Secondary | ICD-10-CM | POA: Diagnosis not present

## 2020-09-06 DIAGNOSIS — M6281 Muscle weakness (generalized): Secondary | ICD-10-CM | POA: Diagnosis not present

## 2020-09-06 DIAGNOSIS — M179 Osteoarthritis of knee, unspecified: Secondary | ICD-10-CM | POA: Diagnosis not present

## 2020-09-06 DIAGNOSIS — R269 Unspecified abnormalities of gait and mobility: Secondary | ICD-10-CM | POA: Diagnosis not present

## 2020-09-06 DIAGNOSIS — I10 Essential (primary) hypertension: Secondary | ICD-10-CM | POA: Diagnosis not present

## 2020-09-06 DIAGNOSIS — I2693 Single subsegmental pulmonary embolism without acute cor pulmonale: Secondary | ICD-10-CM | POA: Diagnosis not present

## 2020-09-06 NOTE — Telephone Encounter (Signed)
Deductible N/A  OOP MAX $3600 ($1023.7 MET)   Annual exam 02/18/2020 JK  Calcium    9.9         Date 02/18/2020   Upcoming dental procedures   Prior Authorization needed NO  Pt estimated Cost $243       Coverage Details: 20% OF ONE DOSE, $30 ADMIN FEE

## 2020-09-06 NOTE — Telephone Encounter (Signed)
Spoke with patient she states she will call me back if she decides to proceed with Prolia. Will send a letter out as well as far as courtesy

## 2020-09-13 DIAGNOSIS — I5032 Chronic diastolic (congestive) heart failure: Secondary | ICD-10-CM | POA: Diagnosis not present

## 2020-09-13 DIAGNOSIS — I2510 Atherosclerotic heart disease of native coronary artery without angina pectoris: Secondary | ICD-10-CM | POA: Diagnosis not present

## 2020-09-13 DIAGNOSIS — M810 Age-related osteoporosis without current pathological fracture: Secondary | ICD-10-CM | POA: Diagnosis not present

## 2020-09-13 DIAGNOSIS — I10 Essential (primary) hypertension: Secondary | ICD-10-CM | POA: Diagnosis not present

## 2020-09-13 DIAGNOSIS — M1711 Unilateral primary osteoarthritis, right knee: Secondary | ICD-10-CM | POA: Diagnosis not present

## 2020-09-13 DIAGNOSIS — E039 Hypothyroidism, unspecified: Secondary | ICD-10-CM | POA: Diagnosis not present

## 2020-09-13 DIAGNOSIS — I2693 Single subsegmental pulmonary embolism without acute cor pulmonale: Secondary | ICD-10-CM | POA: Diagnosis not present

## 2020-09-13 DIAGNOSIS — M6281 Muscle weakness (generalized): Secondary | ICD-10-CM | POA: Diagnosis not present

## 2020-09-13 DIAGNOSIS — E785 Hyperlipidemia, unspecified: Secondary | ICD-10-CM | POA: Diagnosis not present

## 2020-09-13 DIAGNOSIS — M179 Osteoarthritis of knee, unspecified: Secondary | ICD-10-CM | POA: Diagnosis not present

## 2020-09-13 DIAGNOSIS — R269 Unspecified abnormalities of gait and mobility: Secondary | ICD-10-CM | POA: Diagnosis not present

## 2020-09-15 DIAGNOSIS — I10 Essential (primary) hypertension: Secondary | ICD-10-CM | POA: Diagnosis not present

## 2020-09-15 DIAGNOSIS — M179 Osteoarthritis of knee, unspecified: Secondary | ICD-10-CM | POA: Diagnosis not present

## 2020-09-15 DIAGNOSIS — M6281 Muscle weakness (generalized): Secondary | ICD-10-CM | POA: Diagnosis not present

## 2020-09-15 DIAGNOSIS — R269 Unspecified abnormalities of gait and mobility: Secondary | ICD-10-CM | POA: Diagnosis not present

## 2020-09-15 DIAGNOSIS — I2693 Single subsegmental pulmonary embolism without acute cor pulmonale: Secondary | ICD-10-CM | POA: Diagnosis not present

## 2020-09-19 DIAGNOSIS — I10 Essential (primary) hypertension: Secondary | ICD-10-CM | POA: Diagnosis not present

## 2020-09-19 DIAGNOSIS — I2693 Single subsegmental pulmonary embolism without acute cor pulmonale: Secondary | ICD-10-CM | POA: Diagnosis not present

## 2020-09-19 DIAGNOSIS — M179 Osteoarthritis of knee, unspecified: Secondary | ICD-10-CM | POA: Diagnosis not present

## 2020-09-19 DIAGNOSIS — M6281 Muscle weakness (generalized): Secondary | ICD-10-CM | POA: Diagnosis not present

## 2020-09-19 DIAGNOSIS — R269 Unspecified abnormalities of gait and mobility: Secondary | ICD-10-CM | POA: Diagnosis not present

## 2020-09-22 DIAGNOSIS — R269 Unspecified abnormalities of gait and mobility: Secondary | ICD-10-CM | POA: Diagnosis not present

## 2020-09-22 DIAGNOSIS — I10 Essential (primary) hypertension: Secondary | ICD-10-CM | POA: Diagnosis not present

## 2020-09-22 DIAGNOSIS — M179 Osteoarthritis of knee, unspecified: Secondary | ICD-10-CM | POA: Diagnosis not present

## 2020-09-22 DIAGNOSIS — M6281 Muscle weakness (generalized): Secondary | ICD-10-CM | POA: Diagnosis not present

## 2020-09-22 DIAGNOSIS — I2693 Single subsegmental pulmonary embolism without acute cor pulmonale: Secondary | ICD-10-CM | POA: Diagnosis not present

## 2020-09-26 DIAGNOSIS — I2693 Single subsegmental pulmonary embolism without acute cor pulmonale: Secondary | ICD-10-CM | POA: Diagnosis not present

## 2020-09-26 DIAGNOSIS — M179 Osteoarthritis of knee, unspecified: Secondary | ICD-10-CM | POA: Diagnosis not present

## 2020-09-26 DIAGNOSIS — I10 Essential (primary) hypertension: Secondary | ICD-10-CM | POA: Diagnosis not present

## 2020-09-26 DIAGNOSIS — M6281 Muscle weakness (generalized): Secondary | ICD-10-CM | POA: Diagnosis not present

## 2020-09-26 DIAGNOSIS — R269 Unspecified abnormalities of gait and mobility: Secondary | ICD-10-CM | POA: Diagnosis not present

## 2020-09-27 NOTE — Progress Notes (Addendum)
Cardiology Office Note:    Date:  09/28/2020   ID:  Lynn Brown, DOB 12/31/1931, MRN [id number]  PCP:  Laurann MontanaWhite, Cynthia, MD   [facility] Providers Cardiologist:  Lesleigh NoeHenry W Smith III, MD     Referring MD: Laurann MontanaWhite, Cynthia, MD   Chief Complaint:  Hospitalization Follow-up (S/p submassive PE)    Patient Profile:    Lynn Brown is a 87 y.o. 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 RVSP severely elevated ~77 mmHG.  EF 55-60, no RWMA, mild LVH, GR 1 DD, GLS -13.4%, mildly reduced RVSF, RVSP 76.6, mild to moderate RAE, trivial MR, moderate TR, trivial AI  Chest CTA 08/09/2020 IMPRESSION: Extensive pulmonary emboli within the right lung. CT evidence of right heart strain (RV/LV Ratio = 1.22) consistent with at least submassive (intermediate risk) PE. The presence of right heart strain has been associated with an increased risk of morbidity and mortality. Coronary artery disease. Atelectasis in the left lower lobe. Aortic Atherosclerosis (ICD10-I70.0).  LEFT HEART CATH AND CORONARY ANGIOGRAPHY 03/10/2018 Narrative  Coronary artery ectasia involving the proximal and mid circumflex as well as the proximal to mid RCA.  The left circumflex appears to have eccentric globular filling defect consistent with thrombus.  The left anterior descending fusiform aneurysm has flow abnormality and either thrombus or flow disturbance  mimicking thrombus.  Both vessels have slow filling and slow flow.  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  Normal left ventricular function.  Supraventricular tachycardia 140 bpm upon entering the Cath Lab.  15 mg of IV metoprolol was given and 5 mg aliquots over 15 minutes and led to resolution.  RECOMMENDATIONS:  Dual antiplatelet therapy with aspirin and Plavix versus anticoagulation with warfarin/NOAC -there is more data in favor of anticoagulation than dual antiplatelet therapy.  Would not recommend percutaneous intervention and circumflex.  If LAD aneurysm grows, could consider stent graft.  Aneurysm can be followed by coronary CTA if desired.   NM Myocar Multi W/Spect W/Wall Motion / EF 10/08/2016 Narrative IMPRESSION: 1. No reversible ischemia or infarction. 2. Normal left ventricular wall motion. 3. Left ventricular ejection fraction 75% 4. Non invasive risk stratification*: Low   History of Present Illness: Lynn Brown was last seen by Dr. Katrinka BlazingSmith in 9/21.  She was admitted 3/15-3/18 with submassive pulmonary embolism with associated RV strain and severe pulmonary hypertension.  She was hemodynamically stable and not felt to require lytic therapy.  She was followed by critical care and recommendation was to repeat echocardiogram in 1 month.  She was placed on anticoagulation with Apixaban.  Her pulmonary embolism was felt to be unprovoked and she will require lifelong anticoagulation.  She returns for follow-up.  She is here today with her daughter.  Since discharge, she has seen pulmonology and hematology.  Work-up for hypercoagulable state is ongoing.  She seems to be tolerating anticoagulation.  She notes that her breathing is significantly improved.  She still does get short of breath with some activities.  She sleeps on an incline chronically.  She has not had chest discomfort, syncope.  She has  chronic lower extremity swelling.  This is largely unchanged.  Leg edema does improve with elevation        Past Medical History:  Diagnosis Date  . Allergic rhinitis   . Anxiety   . Bunion    PES PLANUS  . Hearing loss    HEARING AIDS, BUT DOES NOT WEAR THEM CONSISTENTLY.   Marland Kitchen Hypercholesteremia   . Hypertension   . Hypothyroid   . Leaky heart valve    DR. HARWANI  . Mild obesity   . Osteoarthritis of knees, bilateral   . Osteoporosis 12/2017   T score -3.3  . Venous insufficiency     Current Medications: Current Meds  Medication Sig  . Acetaminophen 500 MG capsule Take 500 mg by mouth every 6 (six) hours as needed for fever or pain.  Marland Kitchen apixaban (ELIQUIS) 5 MG TABS tablet Take 1 tablet (5 mg total) by mouth 2 (two) times daily.  Marland Kitchen ascorbic acid (VITAMIN C) 500 MG tablet Take 500 mg by mouth in the morning and at bedtime.  Marland Kitchen aspirin EC 81 MG tablet Take 1 tablet (81 mg total) by mouth daily. Swallow whole.  . Calcium Citrate-Vitamin D 315-250 MG-UNIT TABS Take 1 tablet by mouth daily.  . cetirizine (ZYRTEC) 10 MG tablet Take 10 mg by mouth daily as needed (allergies.).   Marland Kitchen Cholecalciferol (VITAMIN D) 2000 UNITS tablet Take 2,000 Units by mouth daily.  . clorazepate (TRANXENE) 7.5 MG tablet Take 7.5 mg by mouth daily as needed for anxiety.   . Coenzyme Q10 (COQ10) 50 MG CAPS Take 50 mg by mouth daily at 2 PM.  . furosemide (LASIX) 20 MG tablet Take 1 tablet (20 mg total) by mouth as needed for fluid or edema.  Marland Kitchen levothyroxine (SYNTHROID) 112 MCG tablet Take 112 mcg by mouth every morning.  . magnesium oxide (MAG-OX) 400 MG tablet Take 400 mg by mouth every evening.   . metoprolol tartrate (LOPRESSOR) 25 MG tablet Take 0.5 tablets (12.5 mg total) by mouth 2 (two) times daily.  . nitroGLYCERIN (NITROSTAT) 0.4 MG SL tablet Place 1 tablet (0.4 mg total) under the tongue every 5 (five) minutes as needed for chest pain.  . rosuvastatin (CRESTOR) 20 MG tablet Take 20 mg by mouth every  evening.   Marland Kitchen spironolactone (ALDACTONE) 25 MG tablet TAKE 1 TABLET BY MOUTH  DAILY     Allergies:   Patient has no known allergies.   Social History   Tobacco Use  . Smoking status: Never Smoker  . Smokeless tobacco: Never Used  Vaping Use  . Vaping Use: Never used  Substance Use Topics  . Alcohol use: No    Alcohol/week: 0.0 standard drinks  . Drug use: No     Family Hx: The patient's family history includes CVA in her brother, father, and mother; Cancer in her brother; Deep vein thrombosis in her sister; Diabetes in her sister; Heart disease in her sister; Hypertension in her brother, brother, father, mother, and sister; Kidney disease in her sister; Kidney failure in her sister; Lymphoma in her daughter; Multiple sclerosis in her sister; Other in her brother; Pancreatic cancer in her sister; Pulmonary embolism in her niece; Seizures in her brother. There is no history of Breast cancer.  ROS - See HPI  EKGs/Labs/Other Test Reviewed:    EKG:  EKG is not ordered today.  The ekg ordered today demonstrates n/a EKG when she was admitted to the hospital with a PE on 08/09/20 was personally reviewed and demonstrated NSR, HR 77, QTc 445, +S1/Q3/T3  Recent Labs: 08/09/2020: B Natriuretic Peptide 85.9 08/17/2020: ALT 20; BUN 16; Creatinine 1.04; Hemoglobin 13.9; Platelet Count 222; Potassium 5.3; Sodium 142   Recent Lipid Panel No results found for: CHOL, TRIG, HDL, CHOLHDL, LDLCALC, LDLDIRECT    Risk Assessment/Calculations:      Physical Exam:    VS:  BP 138/70   Pulse 73   Ht 5\' 6"  (1.676 m)   Wt 187 lb 9.6 oz (85.1 kg)   SpO2 93%   BMI 30.28 kg/m     Wt Readings from Last 3 Encounters:  09/28/20 187 lb 9.6 oz (85.1 kg)  09/01/20 188 lb 6.4 oz (85.5 kg)  08/17/20 190 lb 12.8 oz (86.5 kg)     Constitutional:      Appearance: Healthy appearance. Not in distress.  Neck:     Vascular: JVD normal.  Pulmonary:     Effort: Pulmonary effort is normal.     Breath sounds:  No wheezing. No rales.  Cardiovascular:     Normal rate. Regular rhythm. Normal S1. Normal S2.     Murmurs: There is no murmur.  Edema:    Pretibial: bilateral 2+ edema of the pretibial area.    Ankle: bilateral 2+ edema of the ankle.    Comments: + stasis changes Abdominal:     Palpations: Abdomen is soft.  Skin:    General: Skin is warm and dry.  Neurological:     Mental Status: Alert and oriented to person, place and time.     Cranial Nerves: Cranial nerves are intact.          ASSESSMENT & PLAN:    1. History of pulmonary embolism 2. Other secondary pulmonary hypertension (HCC) She had submassive right-sided pulmonary embolism.  She had significant pulmonary hypertension and RV dysfunction.  Her breathing is much improved.  She has seen pulmonology already.  She does have an echocardiogram pending next month.  Hopefully, her pulmonary pressures will improve.  She currently has bilateral lower extremity swelling.  However, this is chronic and she notes that this is unchanged.  Overall, her volume status seems to be stable.  As outlined below, I will give her furosemide to take as needed for leg swelling.  Otherwise continue current therapy.  She remains on Apixaban and seems to be tolerating this well.  She will likely need lifelong anticoagulation given unprovoked pulmonary embolism  3. Coronary artery disease involving native coronary artery of native heart without angina pectoris History of non-STEMI in 2019.  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  She had been on anticoagulation for quite some time but this was discontinued in 2021.  She is now back on anticoagulation in the setting of recent pulmonary embolism.  She is not having anginal symptoms.  Continue aspirin, beta-blocker and statin therapy.  4. Essential hypertension Fair control.  Continue current dose of spironolactone, metoprolol tartrate.  5. Chronic heart failure with preserved ejection fraction  (HCC) NYHA IIb-III.  Volume status seems to be stable.  Lower extremity edema is likely related to venous insufficiency.  She is still recovering from pulmonary embolism and gets short of breath with some activities.  However, her lungs are clear and her neck veins are flat.  She is already on daily spironolactone.  I will  give her furosemide to take 20 mg daily as needed for significant leg swelling.  I have asked her to contact us if she takes this more than 2 days in a row.  She has a solitary kidney (kidney donor) and would need follow-up BMET to reassess potassium, creatinine.      Dispo:  Return in about 3 months (around 12/29/2020) for Routine Follow Up w/ Dr. Katrinka Blazing.   Medication Adjustments/Labs and Tests Ordered: Current medicines are reviewed at length with the patient today.  Concerns regarding medicines are outlined above.  Tests Ordered: No orders of the defined types were placed in this encounter.  Medication Changes: Meds ordered this encounter  Medications  . furosemide (LASIX) 20 MG tablet    Sig: Take 1 tablet (20 mg total) by mouth as needed for fluid or edema.    Dispense:  30 tablet    Refill:  3    Signed, Shantai Tiedeman, PA-C  09/28/2020 5:18 PM    [facility] [address] [place], [place], [place]  [address] Phone: [phone]; Fax: [phone]

## 2020-09-28 ENCOUNTER — Encounter: Payer: Self-pay | Admitting: Physician Assistant

## 2020-09-28 ENCOUNTER — Ambulatory Visit: Payer: Medicare Other | Admitting: Physician Assistant

## 2020-09-28 ENCOUNTER — Other Ambulatory Visit: Payer: Self-pay

## 2020-09-28 VITALS — BP 138/70 | HR 73 | Ht 66.0 in | Wt 187.6 lb

## 2020-09-28 DIAGNOSIS — I10 Essential (primary) hypertension: Secondary | ICD-10-CM | POA: Diagnosis not present

## 2020-09-28 DIAGNOSIS — Z86711 Personal history of pulmonary embolism: Secondary | ICD-10-CM

## 2020-09-28 DIAGNOSIS — I2729 Other secondary pulmonary hypertension: Secondary | ICD-10-CM

## 2020-09-28 DIAGNOSIS — I5032 Chronic diastolic (congestive) heart failure: Secondary | ICD-10-CM | POA: Diagnosis not present

## 2020-09-28 DIAGNOSIS — I2510 Atherosclerotic heart disease of native coronary artery without angina pectoris: Secondary | ICD-10-CM

## 2020-09-28 DIAGNOSIS — I471 Supraventricular tachycardia: Secondary | ICD-10-CM

## 2020-09-28 DIAGNOSIS — E7800 Pure hypercholesterolemia, unspecified: Secondary | ICD-10-CM

## 2020-09-28 MED ORDER — FUROSEMIDE 20 MG PO TABS: 20.0000 mg | ORAL_TABLET | ORAL | 3 refills | Status: DC | PRN

## 2020-09-28 NOTE — Patient Instructions (Addendum)
Medication Instructions:  Your physician has recommended you make the following change in your medication:   1.  Take Lasix everyday as needed by mouth ( 20 mg) for swelling.  Call if you take > than 2 days or       send [employer].  *If you need a refill on your cardiac medications before your next appointment, please call your pharmacy*   Lab Work: -NONE  If you have labs (blood work) drawn today and your tests are completely normal, you will receive your results only by: Marland Kitchen MyChart Message (if you have MyChart) OR . A paper copy in the mail If you have any lab test that is abnormal or we need to change your treatment, we will call you to review the results.   Testing/Procedures: Keep your appointment for echo in June.   Follow-Up: At [facility], you and your health needs are our priority.  As part of our continuing mission to provide you with exceptional heart care, we have created designated Provider Care Teams.  These Care Teams include your primary Cardiologist (physician) and Advanced Practice Providers (APPs -  Physician Assistants and Nurse Practitioners) who all work together to provide you with the care you need, when you need it.  We recommend signing up for the patient portal called "MyChart".  Sign up information is provided on this After Visit Summary.  MyChart is used to connect with patients for Virtual Visits (Telemedicine).  Patients are able to view lab/test results, encounter notes, upcoming appointments, etc.  Non-urgent messages can be sent to your provider as well.   To learn more about what you can do with MyChart, go to [email or web address].    Your next appointment:   2 month(s)  With Dr. Katrinka Blazing on Wednesday, July 20 @ 9:40 am.   The format for your next appointment:   In Person  Provider:   Verdis Prime, MD   Other Instructions -None

## 2020-09-29 DIAGNOSIS — I10 Essential (primary) hypertension: Secondary | ICD-10-CM | POA: Diagnosis not present

## 2020-09-29 DIAGNOSIS — R269 Unspecified abnormalities of gait and mobility: Secondary | ICD-10-CM | POA: Diagnosis not present

## 2020-09-29 DIAGNOSIS — I2693 Single subsegmental pulmonary embolism without acute cor pulmonale: Secondary | ICD-10-CM | POA: Diagnosis not present

## 2020-09-29 DIAGNOSIS — M6281 Muscle weakness (generalized): Secondary | ICD-10-CM | POA: Diagnosis not present

## 2020-09-29 DIAGNOSIS — M179 Osteoarthritis of knee, unspecified: Secondary | ICD-10-CM | POA: Diagnosis not present

## 2020-10-03 DIAGNOSIS — M179 Osteoarthritis of knee, unspecified: Secondary | ICD-10-CM | POA: Diagnosis not present

## 2020-10-03 DIAGNOSIS — I10 Essential (primary) hypertension: Secondary | ICD-10-CM | POA: Diagnosis not present

## 2020-10-03 DIAGNOSIS — I2693 Single subsegmental pulmonary embolism without acute cor pulmonale: Secondary | ICD-10-CM | POA: Diagnosis not present

## 2020-10-03 DIAGNOSIS — R269 Unspecified abnormalities of gait and mobility: Secondary | ICD-10-CM | POA: Diagnosis not present

## 2020-10-03 DIAGNOSIS — M6281 Muscle weakness (generalized): Secondary | ICD-10-CM | POA: Diagnosis not present

## 2020-10-06 DIAGNOSIS — M6281 Muscle weakness (generalized): Secondary | ICD-10-CM | POA: Diagnosis not present

## 2020-10-06 DIAGNOSIS — I10 Essential (primary) hypertension: Secondary | ICD-10-CM | POA: Diagnosis not present

## 2020-10-06 DIAGNOSIS — R269 Unspecified abnormalities of gait and mobility: Secondary | ICD-10-CM | POA: Diagnosis not present

## 2020-10-06 DIAGNOSIS — M179 Osteoarthritis of knee, unspecified: Secondary | ICD-10-CM | POA: Diagnosis not present

## 2020-10-06 DIAGNOSIS — I2693 Single subsegmental pulmonary embolism without acute cor pulmonale: Secondary | ICD-10-CM | POA: Diagnosis not present

## 2020-11-09 ENCOUNTER — Other Ambulatory Visit: Payer: Self-pay

## 2020-11-09 ENCOUNTER — Ambulatory Visit (HOSPITAL_COMMUNITY): Payer: Medicare Other | Attending: Internal Medicine

## 2020-11-09 DIAGNOSIS — I2694 Multiple subsegmental pulmonary emboli without acute cor pulmonale: Secondary | ICD-10-CM | POA: Diagnosis not present

## 2020-11-09 LAB — ECHOCARDIOGRAM COMPLETE
Area-P 1/2: 1.94 cm2
S' Lateral: 2.4 cm

## 2020-11-17 ENCOUNTER — Other Ambulatory Visit: Payer: Self-pay | Admitting: Physician Assistant

## 2020-11-17 DIAGNOSIS — I2699 Other pulmonary embolism without acute cor pulmonale: Secondary | ICD-10-CM

## 2020-11-18 ENCOUNTER — Other Ambulatory Visit: Payer: Self-pay

## 2020-11-18 ENCOUNTER — Inpatient Hospital Stay: Payer: Medicare Other | Attending: Physician Assistant | Admitting: Physician Assistant

## 2020-11-18 ENCOUNTER — Inpatient Hospital Stay: Payer: Medicare Other

## 2020-11-18 DIAGNOSIS — Z7901 Long term (current) use of anticoagulants: Secondary | ICD-10-CM | POA: Insufficient documentation

## 2020-11-18 DIAGNOSIS — I739 Peripheral vascular disease, unspecified: Secondary | ICD-10-CM | POA: Diagnosis not present

## 2020-11-18 DIAGNOSIS — Z86711 Personal history of pulmonary embolism: Secondary | ICD-10-CM | POA: Diagnosis not present

## 2020-11-18 DIAGNOSIS — I2699 Other pulmonary embolism without acute cor pulmonale: Secondary | ICD-10-CM | POA: Diagnosis not present

## 2020-11-18 LAB — CBC WITH DIFFERENTIAL (CANCER CENTER ONLY)
Abs Immature Granulocytes: 0.01 10*3/uL (ref 0.00–0.07)
Basophils Absolute: 0 10*3/uL (ref 0.0–0.1)
Basophils Relative: 1 %
Eosinophils Absolute: 0.1 10*3/uL (ref 0.0–0.5)
Eosinophils Relative: 3 %
HCT: 39.9 % (ref 36.0–46.0)
Hemoglobin: 13.2 g/dL (ref 12.0–15.0)
Immature Granulocytes: 0 %
Lymphocytes Relative: 43 %
Lymphs Abs: 1.8 10*3/uL (ref 0.7–4.0)
MCH: 30.9 pg (ref 26.0–34.0)
MCHC: 33.1 g/dL (ref 30.0–36.0)
MCV: 93.4 fL (ref 80.0–100.0)
Monocytes Absolute: 0.5 10*3/uL (ref 0.1–1.0)
Monocytes Relative: 12 %
Neutro Abs: 1.7 10*3/uL (ref 1.7–7.7)
Neutrophils Relative %: 41 %
Platelet Count: 173 10*3/uL (ref 150–400)
RBC: 4.27 MIL/uL (ref 3.87–5.11)
RDW: 14.4 % (ref 11.5–15.5)
WBC Count: 4.1 10*3/uL (ref 4.0–10.5)
nRBC: 0 % (ref 0.0–0.2)

## 2020-11-18 LAB — CMP (CANCER CENTER ONLY)
ALT: 13 U/L (ref 0–44)
AST: 18 U/L (ref 15–41)
Albumin: 3.9 g/dL (ref 3.5–5.0)
Alkaline Phosphatase: 59 U/L (ref 38–126)
Anion gap: 10 (ref 5–15)
BUN: 23 mg/dL (ref 8–23)
CO2: 26 mmol/L (ref 22–32)
Calcium: 10 mg/dL (ref 8.9–10.3)
Chloride: 106 mmol/L (ref 98–111)
Creatinine: 0.93 mg/dL (ref 0.44–1.00)
GFR, Estimated: 60 mL/min — ABNORMAL LOW (ref >60)
Glucose, Bld: 92 mg/dL (ref 70–99)
Potassium: 4.5 mmol/L (ref 3.5–5.1)
Sodium: 142 mmol/L (ref 135–145)
Total Bilirubin: 0.8 mg/dL (ref 0.3–1.2)
Total Protein: 7.6 g/dL (ref 6.5–8.1)

## 2020-11-18 NOTE — Progress Notes (Signed)
[facility] [phone]   [phone]) [phone]  INITIAL PROGRESS NOTE  Patient Care Team: Laurann Montana, MD as PCP - General (Family Medicine) Lyn Records, MD as PCP - Cardiology (Cardiology)  Hematological/Oncological History 1) 08/09/2020-08/12/2020:  -Admitted for extensive pulmonary emboli in the right lung -Echo:EF 55-60%, RVSP 77 mmHg-RV systolic function mildly reduced. -Bilateral LE doppler US: No DVT -IV heparin x 48 hours and then transitioned to Eliquis  2) 08/17/2020: Establish care with Georga Kaufmann PA-C   CHIEF COMPLAINT: Pulmonary emboli currently on Eliquis  HISTORY OF PRESENTING ILLNESS:  Lynn Brown 87 y.o. female returns for a follow up for history of pulmonary emboli, currently on eliquis. She reports that she is feeling well without any significant limitations. She is able to ambulate on her own using a cane and can complete her ADL independently. Patient has a good appetite without any noticeable weight changes.  She denies any nausea, vomiting or abdominal pain.  Patient has regular bowel movements without any hematochezia or melena.  Patient has chronic lower extremity edema secondary to PAD. Patient denies any fevers, chills, chest pain or cough.  She has no other complaints.  Rest of the 10 point ROS is low.  MEDICAL HISTORY:  Past Medical History:  Diagnosis Date   Allergic rhinitis    Anxiety    Bunion    PES PLANUS   Hearing loss    HEARING AIDS, BUT DOES NOT WEAR THEM CONSISTENTLY.    Hypercholesteremia    Hypertension    Hypothyroid    Leaky heart valve    DR. HARWANI   Mild obesity    Osteoarthritis of knees, bilateral    Osteoporosis 12/2017   T score -3.3   Venous insufficiency     SURGICAL HISTORY: Past Surgical History:  Procedure Laterality Date   ABDOMINAL AORTOGRAM W/LOWER EXTREMITY Left 05/04/2020   Procedure: ABDOMINAL AORTOGRAM W/LOWER EXTREMITY;  Surgeon: Leonie Douglas, MD;  Location: [facility]  INVASIVE CV LAB;  Service: Cardiovascular;  Laterality: Left;   CATARACT EXTRACTION, BILATERAL  2013   DR. SHAPIRO    HYSTEROSCOPY     D & C   LEFT HEART CATH AND CORONARY ANGIOGRAPHY N/A 03/10/2018   Procedure: LEFT HEART CATH AND CORONARY ANGIOGRAPHY;  Surgeon: Lyn Records, MD;  Location: [facility] INVASIVE CV LAB;  Service: Cardiovascular;  Laterality: N/A;   NEPHRECTOMY LIVING DONOR  1978   PERIPHERAL VASCULAR INTERVENTION Left 05/04/2020   Procedure: PERIPHERAL VASCULAR INTERVENTION;  Surgeon: Leonie Douglas, MD;  Location: [facility] INVASIVE CV LAB;  Service: Cardiovascular;  Laterality: Left;  Femoral popliteal    SOCIAL HISTORY: Social History   Socioeconomic History   Marital status: Widowed    Spouse name: Not on file   Number of children: 4   Years of education: Not on file   Highest education level: Not on file  Occupational History   Occupation: RETIRED  Tobacco Use   Smoking status: Never   Smokeless tobacco: Never  Vaping Use   Vaping Use: Never used  Substance and Sexual Activity   Alcohol use: No    Alcohol/week: 0.0 standard drinks   Drug use: No   Sexual activity: Never    Comment: 1st intercourse 87 yo-Fewer than 5 partners  Other Topics Concern   Not on file  Social History Narrative   Not on file   Social Determinants of Health   Financial Resource Strain: Not on file  Food Insecurity: Not on file  Transportation Needs: Not on file  Physical Activity: Not on file  Stress: Not on file  Social Connections: Not on file  Intimate Partner Violence: Not on file    FAMILY HISTORY: Family History  Problem Relation Age of Onset   Hypertension Mother    CVA Mother    Diabetes Sister    Hypertension Sister    Heart disease Sister    Multiple sclerosis Sister    Hypertension Father    CVA Father    Cancer Brother        prostrate   Hypertension Brother    CVA Brother    Seizures Brother    Pancreatic cancer Sister    Kidney disease Sister    Kidney  failure Sister    Other Brother        PNEUMONIA AS A BABY   Hypertension Brother    Lymphoma Daughter    Deep vein thrombosis Sister    Pulmonary embolism Niece    Breast cancer Neg Hx     ALLERGIES:  has No Known Allergies.  MEDICATIONS:  Current Outpatient Medications  Medication Sig Dispense Refill   Acetaminophen 500 MG capsule Take 500 mg by mouth every 6 (six) hours as needed for fever or pain.     apixaban (ELIQUIS) 5 MG TABS tablet Take 1 tablet (5 mg total) by mouth 2 (two) times daily. 60 tablet 3   ascorbic acid (VITAMIN C) 500 MG tablet Take 500 mg by mouth in the morning and at bedtime.     aspirin EC 81 MG tablet Take 1 tablet (81 mg total) by mouth daily. Swallow whole. 90 tablet 3   Calcium Citrate-Vitamin D 315-250 MG-UNIT TABS Take 1 tablet by mouth daily.     cetirizine (ZYRTEC) 10 MG tablet Take 10 mg by mouth daily as needed (allergies.).      Cholecalciferol (VITAMIN D) 2000 UNITS tablet Take 2,000 Units by mouth daily.     clorazepate (TRANXENE) 7.5 MG tablet Take 7.5 mg by mouth daily as needed for anxiety.      Coenzyme Q10 (COQ10) 50 MG CAPS Take 50 mg by mouth daily at 2 PM.     furosemide (LASIX) 20 MG tablet Take 1 tablet (20 mg total) by mouth as needed for fluid or edema. 30 tablet 3   levothyroxine (SYNTHROID) 112 MCG tablet Take 112 mcg by mouth every morning.     magnesium oxide (MAG-OX) 400 MG tablet Take 400 mg by mouth every evening.      metoprolol tartrate (LOPRESSOR) 25 MG tablet Take 0.5 tablets (12.5 mg total) by mouth 2 (two) times daily. 60 tablet 0   nitroGLYCERIN (NITROSTAT) 0.4 MG SL tablet Place 1 tablet (0.4 mg total) under the tongue every 5 (five) minutes as needed for chest pain. 30 tablet 0   rosuvastatin (CRESTOR) 20 MG tablet Take 20 mg by mouth every evening.      spironolactone (ALDACTONE) 25 MG tablet TAKE 1 TABLET BY MOUTH  DAILY 90 tablet 3   No current facility-administered medications for this visit.    REVIEW OF  SYSTEMS:   Constitutional: ( - ) fevers, ( - )  chills , ( - ) night sweats Eyes: ( - ) blurriness of vision, ( - ) double vision, ( - ) watery eyes Ears, nose, mouth, throat, and face: ( - ) mucositis, ( - ) sore throat Respiratory: ( - ) cough, ( - ) dyspnea, ( - ) wheezes Cardiovascular: ( - )  palpitation, ( - ) chest discomfort, ( + ) lower extremity swelling Gastrointestinal:  ( - ) nausea, ( - ) heartburn, ( - ) change in bowel habits Skin: ( - ) abnormal skin rashes Lymphatics: ( - ) new lymphadenopathy, ( - ) easy bruising Neurological: ( - ) numbness, ( - ) tingling, ( - ) new weaknesses Behavioral/Psych: ( - ) mood change, ( - ) new changes  All other systems were reviewed with the patient and are negative.  PHYSICAL EXAMINATION: ECOG PERFORMANCE STATUS: 1 - Symptomatic but completely ambulatory  Vitals:   11/18/20 1258  BP: 136/72  Pulse: 71  Resp: 17  Temp: 97.6 F (36.4 C)  SpO2: 98%   Filed Weights   11/18/20 1258  Weight: 189 lb 8 oz (86 kg)    GENERAL: well appearing female in NAD  SKIN: skin color, texture, turgor are normal, no rashes or significant lesions EYES: conjunctiva are pink and non-injected, sclera clear OROPHARYNX: no exudate, no erythema; lips, buccal mucosa, and tongue normal  NECK: supple, non-tender LYMPH:  no palpable lymphadenopathy in the cervical, axillary or supraclavicular lymph nodes.  LUNGS: clear to auscultation and percussion with normal breathing effort HEART: regular rate & rhythm and no murmurs. +bilateral lower extremity edema with darkening pigmentation of skin.  ABDOMEN: soft, non-tender, non-distended, normal bowel sounds Musculoskeletal: no cyanosis of digits and no clubbing  PSYCH: alert & oriented x 3, fluent speech NEURO: no focal motor/sensory deficits  LABORATORY DATA:  I have reviewed the data as listed CBC Latest Ref Rng & Units 11/18/2020 08/17/2020 08/11/2020  WBC 4.0 - 10.5 K/uL 4.1 3.7(L) 4.9  Hemoglobin 12.0 -  15.0 g/dL 71.0 62.6 94.8  Hematocrit 36.0 - 46.0 % 39.9 43.1 38.1  Platelets 150 - 400 K/uL 173 222 162    CMP Latest Ref Rng & Units 11/18/2020 08/17/2020 08/09/2020  Glucose 70 - 99 mg/dL 92 546(E) 703(J)  BUN 8 - 23 mg/dL 23 16 22   Creatinine 0.44 - 1.00 mg/dL 0.09) 3.81(W)  Sodium 135 - 145 mmol/L 142 142 138  Potassium 3.5 - 5.1 mmol/L 4.5 5.3(H) 4.1  Chloride 98 - 111 mmol/L 106 106 109  CO2 22 - 32 mmol/L 26 26 22   Calcium 8.9 - 10.3 mg/dL 2.99(B 9.5 9.1  Total Protein 6.5 - 8.1 g/dL 7.6 8.0 -  Total Bilirubin 0.3 - 1.2 mg/dL 0.8 0.6 -  Alkaline Phos 38 - 126 U/L 59 74 -  AST 15 - 41 U/L 18 17 -  ALT 0 - 44 U/L 13 20 -   RADIOGRAPHIC STUDIES: I have personally reviewed the radiological images as listed and agreed with the findings in the report. ECHOCARDIOGRAM COMPLETE  Result Date: 11/09/2020    ECHOCARDIOGRAM REPORT   Patient Name:   Lynn Brown Date of Exam: 11/09/2020 Medical Rec #:  Lynn Brown      Height:       66.0 in Accession #:    11/11/2020     Weight:       187.6 lb Date of Birth:  1931/12/25       BSA:          1.946 m Patient Age:    88 years       BP:           138/70 mmHg Patient Gender: F              HR:  70 bpm. Exam Location:  [address] Procedure: 2D Echo, 3D Echo, Cardiac Doppler, Color Doppler and Strain Analysis Indications:    I26.94 Pulmonary embolus.  History:        Patient has prior history of Echocardiogram examinations, most                 recent 08/10/2020. Previous Myocardial Infarction,                 Signs/Symptoms:Shortness of Breath; Risk Factors:Hypertension                 and Dyslipidemia. Pulmonary embolus.  Sonographer:    Garald Braver, RDCS Referring Phys: [phone] MATTHEW R HUNSUCKER IMPRESSIONS  1. Left ventricular ejection fraction, by estimation, is 55 to 60%. Left ventricular ejection fraction by 3D volume is 56 %. The left ventricle has normal function. The left ventricle has no regional wall motion abnormalities. There is  mild left ventricular hypertrophy. Left ventricular diastolic parameters are consistent with Grade I diastolic dysfunction (impaired relaxation). There is the interventricular septum is flattened in systole, consistent with right ventricular pressure overload.  2. Right ventricular systolic function is mildly reduced. The right ventricular size is normal. There is moderately elevated pulmonary artery systolic pressure. The estimated right ventricular systolic pressure is 49.2 mmHg.  3. The mitral valve is abnormal. Trivial mitral valve regurgitation.  4. The aortic valve is tricuspid. Aortic valve regurgitation is not visualized. Comparison(s): Changes from prior study are noted. 08/10/20 EF 55-60%. PA pressure . FINDINGS  Left Ventricle: Left ventricular ejection fraction, by estimation, is 55 to 60%. Left ventricular ejection fraction by 3D volume is 56 %. The left ventricle has normal function. The left ventricle has no regional wall motion abnormalities. The left ventricular internal cavity size was normal in size. There is mild left ventricular hypertrophy. The interventricular septum is flattened in systole, consistent with right ventricular pressure overload. Left ventricular diastolic parameters are consistent with Grade I diastolic dysfunction (impaired relaxation). Indeterminate filling pressures. Right Ventricle: The right ventricular size is normal. No increase in right ventricular wall thickness. Right ventricular systolic function is mildly reduced. There is moderately elevated pulmonary artery systolic pressure. The tricuspid regurgitant velocity is 3.21 m/s, and with an assumed right atrial pressure of 8 mmHg, the estimated right ventricular systolic pressure is 49.2 mmHg. Left Atrium: Left atrial size was normal in size. Right Atrium: Right atrial size was normal in size. Pericardium: There is no evidence of pericardial effusion. Mitral Valve: The mitral valve is abnormal. There is mild  thickening of the mitral valve leaflet(s). Trivial mitral valve regurgitation. Tricuspid Valve: The tricuspid valve is grossly normal. Tricuspid valve regurgitation is mild. Aortic Valve: The aortic valve is tricuspid. Aortic valve regurgitation is not visualized. Pulmonic Valve: The pulmonic valve was grossly normal. Pulmonic valve regurgitation is mild to moderate. Aorta: The aortic root and ascending aorta are structurally normal, with no evidence of dilitation. IAS/Shunts: No atrial level shunt detected by color flow Doppler.  LEFT VENTRICLE PLAX 2D LVIDd:         3.50 cm         Diastology LVIDs:         2.40 cm         LV e' medial:    4.13 cm/s LV PW:         1.10 cm         LV E/e' medial:  9.8 LV IVS:  1.20 cm         LV e' lateral:   4.57 cm/s LVOT diam:     1.90 cm         LV E/e' lateral: 8.9 LV SV:         57 LV SV Index:   29              2D LVOT Area:     2.84 cm        Longitudinal                                Strain                                2D Strain GLS  -22.0 %                                (A2C):                                2D Strain GLS  -16.1 %                                (A3C):                                2D Strain GLS  -19.3 %                                (A4C):                                2D Strain GLS  -19.1 %                                Avg:                                 3D Volume EF                                LV 3D EF:    Left                                             ventricular                                             ejection                                             fraction  by                                             3D volume                                             is 56 %.                                 3D Volume EF:                                3D EF:        56 %                                LV EDV:       69 ml                                LV ESV:       30 ml                                LV SV:        39 ml RIGHT  VENTRICLE RV Basal diam:  3.20 cm RV S prime:     8.04 cm/s TAPSE (M-mode): 1.3 cm RVSP:           49.2 mmHg LEFT ATRIUM             Index       RIGHT ATRIUM           Index LA diam:        4.20 cm 2.16 cm/m  RA Pressure: 8.00 mmHg LA Vol (A2C):   38.3 ml 19.68 ml/m RA Area:     14.50 cm LA Vol (A4C):   38.1 ml 19.58 ml/m RA Volume:   32.90 ml  16.91 ml/m LA Biplane Vol: 40.9 ml 21.02 ml/m  AORTIC VALVE LVOT Vmax:   94.20 cm/s LVOT Vmean:  62.800 cm/s LVOT VTI:    0.201 m  AORTA Ao Root diam: 3.50 cm Ao Asc diam:  3.80 cm MITRAL VALVE               TRICUSPID VALVE                            TR Peak grad:   41.2 mmHg                            TR Vmax:        321.00 cm/s MV E velocity: 40.50 cm/s  Estimated RAP:  8.00 mmHg MV A velocity: 89.20 cm/s  RVSP:           49.2 mmHg MV E/A ratio:  0.45  SHUNTS                            Systemic VTI:  0.20 m                            Systemic Diam: 1.90 cm Zoila Shutter MD Electronically signed by Zoila Shutter MD Signature Date/Time: 11/09/2020/12:41:23 PM    Final      ASSESSMENT & PLAN Lynn Brown is a 87 y.o. female presenting to the clinic for  a follow up while on Eliquis following submassive pulmonary emboli involving the right lung.   #Pulmonary emboli in the right lung, unprovoked:  --CTA chest from 08/09/2020 revealed extensive pulmonary emboli within the right lung.  --Echo from 08/10/2020 :EF 55-60%, RVSP 77 mmHg-RV systolic function mildly reduced. --Bilateral LE doppler US from 3/16/022: No DVT --Currently on Eliquis with good toleration. Since PE was unprovoked, recommendation is indefinite anticoagulation.  -Prior hypercoagulable workup from 08/17/2020 was negative for antiphospholipid antibody syndrome, prothrombin gene mutation, factor V Leiden mutation, protein C and S deficiency.  --Labs today were reviewed that required no intervention. --Patient will return in 3 months with repeat labs. At that time, we will  discuss transitioning Eliquis to maintenance dose.   #Peripheral vascular disease --Underwent SFA/popliteal artery stenting on 04/2020 --Vascular surgery recommend to stop Plavix and continue with aspirin.   No orders of the defined types were placed in this encounter.   All questions were answered. The patient knows to call the clinic with any problems, questions or concerns.  I have spent a total of 25 minutes minutes of face-to-face and non-face-to-face time, preparing to see the patient, obtaining and/or reviewing separately obtained history, performing a medically appropriate examination, counseling and educating the patient, documenting clinical information in the electronic health record, and care coordination.   Georga Kaufmann, PA-C Department of Hematology/Oncology [facility] Phone: [phone]

## 2020-11-29 DIAGNOSIS — R350 Frequency of micturition: Secondary | ICD-10-CM | POA: Diagnosis not present

## 2020-12-13 NOTE — Progress Notes (Signed)
Cardiology Office Note:    Date:  12/14/2020   ID:  Lynn Brown, DOB [birth year], MRN [id number]  PCP:  Laurann Montana, MD  Cardiologist:  Lesleigh Noe, MD   Referring MD: Laurann Montana, MD   Chief Complaint  Patient presents with   Coronary Artery Disease   Hypertension   Follow-up    PE     History of Present Illness:    Lynn Brown is a 87 y.o. female with a hx of nephrectomy as a kidney donor, hyperlipidemia, chronic lower extremity edema, hyperlipidemia, hypothyroidism, "leaky heart valve", LAD aneurysm/circumflex ectasia/RCA ectasia.  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  Eliquis was started as therapy for coronary microembolization from aneurysm and eventually discontinued.  Subsequent development of bilateral submassive pulmonary emboli and now back on Eliquis.    Had unprovoked pulmonary embolism in March 2022.  Taken care of by primary care/hospitalist.  Cardiology was not involved in management.  Plavix was discontinued.  It was being used for PAD along with aspirin.  Aspirin was continued.  She is on Eliquis at this time.  Being managed by pulmonary and hematology.  Her pulmonary embolism etiology was never identified.  Having a thrombophilia evaluation and decision about long-term anticoagulation will be occurring at that point.  In my estimation given her overall phenotype, long-term anticoagulation with Eliquis with same most appropriate.  Not currently having angina.  Past Medical History:  Diagnosis Date   Allergic rhinitis    Anxiety    Bunion    PES PLANUS   Hearing loss    HEARING AIDS, BUT DOES NOT WEAR THEM CONSISTENTLY.    Hypercholesteremia    Hypertension    Hypothyroid    Leaky heart valve    DR. HARWANI   Mild obesity    Osteoarthritis of knees, bilateral    Osteoporosis 12/2017   T score -3.3   Venous insufficiency     Past Surgical History:  Procedure Laterality Date   ABDOMINAL AORTOGRAM  W/LOWER EXTREMITY Left 05/04/2020   Procedure: ABDOMINAL AORTOGRAM W/LOWER EXTREMITY;  Surgeon: Leonie Douglas, MD;  Location: [facility];  Service: Cardiovascular;  Laterality: Left;   CATARACT EXTRACTION, BILATERAL  2013   DR. SHAPIRO    HYSTEROSCOPY     D & C   LEFT HEART CATH AND CORONARY ANGIOGRAPHY N/A 03/10/2018   Procedure: LEFT HEART CATH AND CORONARY ANGIOGRAPHY;  Surgeon: Lyn Records, MD;  Location: [facility];  Service: Cardiovascular;  Laterality: N/A;   NEPHRECTOMY LIVING DONOR  1978   PERIPHERAL VASCULAR INTERVENTION Left 05/04/2020   Procedure: PERIPHERAL VASCULAR INTERVENTION;  Surgeon: Leonie Douglas, MD;  Location: [facility];  Service: Cardiovascular;  Laterality: Left;  Femoral popliteal    Current Medications: Current Meds  Medication Sig   Acetaminophen 500 MG capsule Take 500 mg by mouth every 6 (six) hours as needed for fever or pain.   apixaban (ELIQUIS) 5 MG TABS tablet Take 1 tablet (5 mg total) by mouth 2 (two) times daily.   ascorbic acid (VITAMIN C) 500 MG tablet Take 500 mg by mouth in the morning and at bedtime.   aspirin EC 81 MG tablet Take 1 tablet (81 mg total) by mouth daily. Swallow whole.   Calcium Citrate-Vitamin D 315-250 MG-UNIT TABS Take 1 tablet by mouth daily.   cetirizine (ZYRTEC) 10 MG tablet Take 10 mg by mouth daily as needed (allergies.).    Cholecalciferol (  VITAMIN D) 2000 UNITS tablet Take 2,000 Units by mouth daily.   clorazepate (TRANXENE) 7.5 MG tablet Take 7.5 mg by mouth daily as needed for anxiety.    Coenzyme Q10 (COQ10) 50 MG CAPS Take 50 mg by mouth daily at 2 PM.   furosemide (LASIX) 20 MG tablet Take 20 mg by mouth daily as needed.   levothyroxine (SYNTHROID) 112 MCG tablet Take 112 mcg by mouth every morning.   magnesium oxide (MAG-OX) 400 MG tablet Take 400 mg by mouth every evening.    metoprolol tartrate (LOPRESSOR) 25 MG tablet Take 0.5 tablets (12.5 mg total) by mouth 2 (two) times daily.    nitroGLYCERIN (NITROSTAT) 0.4 MG SL tablet Place 1 tablet (0.4 mg total) under the tongue every 5 (five) minutes as needed for chest pain.   rosuvastatin (CRESTOR) 20 MG tablet Take 20 mg by mouth every evening.    spironolactone (ALDACTONE) 25 MG tablet TAKE 1 TABLET BY MOUTH  DAILY   [DISCONTINUED] furosemide (LASIX) 20 MG tablet Take 1 tablet (20 mg total) by mouth as needed for fluid or edema.     Allergies:   Patient has no known allergies.   Social History   Socioeconomic History   Marital status: Widowed    Spouse name: Not on file   Number of children: 4   Years of education: Not on file   Highest education level: Not on file  Occupational History   Occupation: RETIRED  Tobacco Use   Smoking status: Never   Smokeless tobacco: Never  Vaping Use   Vaping Use: Never used  Substance and Sexual Activity   Alcohol use: No    Alcohol/week: 0.0 standard drinks   Drug use: No   Sexual activity: Never    Comment: 1st intercourse 87 yo-Fewer than 5 partners  Other Topics Concern   Not on file  Social History Narrative   Not on file   Social Determinants of Health   Financial Resource Strain: Not on file  Food Insecurity: Not on file  Transportation Needs: Not on file  Physical Activity: Not on file  Stress: Not on file  Social Connections: Not on file     Family History: The patient's family history includes CVA in her brother, father, and mother; Cancer in her brother; Deep vein thrombosis in her sister; Diabetes in her sister; Heart disease in her sister; Hypertension in her brother, brother, father, mother, and sister; Kidney disease in her sister; Kidney failure in her sister; Lymphoma in her daughter; Multiple sclerosis in her sister; Other in her brother; Pancreatic cancer in her sister; Pulmonary embolism in her niece; Seizures in her brother. There is no history of Breast cancer.  ROS:   Please see the history of present illness.    Frail.  Appetite is been  stable.  Lower extremity swelling is improved.  She is using occasional dose of furosemide since this was given by Tereso NewcomerScott Weaver.  Echo as noted below does not demonstrate any RV strain.  All other systems reviewed and are negative.  EKGs/Labs/Other Studies Reviewed:    The following studies were reviewed today: ECHO 10/2020: IMPRESSIONS   1. Left ventricular ejection fraction, by estimation, is 55 to 60%. Left  ventricular ejection fraction by 3D volume is 56 %. The left ventricle has  normal function. The left ventricle has no regional wall motion  abnormalities. There is mild left  ventricular hypertrophy. Left ventricular diastolic parameters are  consistent with Grade I diastolic dysfunction (impaired  relaxation). There  is the interventricular septum is flattened in systole, consistent with  right ventricular pressure overload.   2. Right ventricular systolic function is mildly reduced. The right  ventricular size is normal. There is moderately elevated pulmonary artery  systolic pressure. The estimated right ventricular systolic pressure is  49.2 mmHg.   3. The mitral valve is abnormal. Trivial mitral valve regurgitation.   4. The aortic valve is tricuspid. Aortic valve regurgitation is not  visualized.   Comparison(s): Changes from prior study are noted. 08/10/20 EF 55-60%. PA  pressure .   EKG:  EKG not performed  Recent Labs: 08/09/2020: B Natriuretic Peptide 85.9 11/18/2020: ALT 13; BUN 23; Creatinine 0.93; Hemoglobin 13.2; Platelet Count 173; Potassium 4.5; Sodium 142  Recent Lipid Panel No results found for: CHOL, TRIG, HDL, CHOLHDL, VLDL, LDLCALC, LDLDIRECT  Physical Exam:    VS:  BP 138/72   Pulse 72   Ht 5\' 6"  (1.676 m)   Wt 188 lb (85.3 kg)   SpO2 97%   BMI 30.34 kg/m     Wt Readings from Last 3 Encounters:  12/14/20 188 lb (85.3 kg)  11/18/20 189 lb 8 oz (86 kg)  09/28/20 187 lb 9.6 oz (85.1 kg)     GEN: Elderly. No acute distress HEENT:  Normal NECK: No JVD. LYMPHATICS: No lymphadenopathy CARDIAC: No murmur. RRR S4 gallop, but with bilateral lower extremity skin lichenification and 1-2+ ankle edema. VASCULAR:  Normal Pulses. No bruits. RESPIRATORY:  Clear to auscultation without rales, wheezing or rhonchi  ABDOMEN: Soft, non-tender, non-distended, No pulsatile mass, MUSCULOSKELETAL: No deformity  SKIN: Warm and dry NEUROLOGIC:  Alert and oriented x 3 PSYCHIATRIC:  Normal affect   ASSESSMENT:    1. NSTEMI (non-ST elevated myocardial infarction) (HCC)   2. Essential hypertension   3. Hypercholesteremia   4. Chronic diastolic heart failure (HCC)   5. Coronary aneurysm   6. Chronic anticoagulation   7. Multiple subsegmental pulmonary emboli without acute cor pulmonale (HCC)    PLAN:    In order of problems listed above:  No recurrence of ischemic symptoms. Blood pressure control is great given her age.  She uses either spironolactone or furosemide not both.  Says she urinated too much when taking both. Continue high intensity statin therapy. Diuretics are being used to control volume in this patient with known diastolic heart failure.  Spironolactone seems to work well.  Furosemide was added but she has not been compliant because of too much urination. No ischemic symptoms.  Eliquis therapy will help prevent aneurysm clotting.  From my perspective aspirin therapy could be discontinued.  I understand it is being used because of concern about PAD. Continue Eliquis.  Consider discontinuing aspirin. Agree with Eliquis.   Medication Adjustments/Labs and Tests Ordered: Current medicines are reviewed at length with the patient today.  Concerns regarding medicines are outlined above.  No orders of the defined types were placed in this encounter.  No orders of the defined types were placed in this encounter.   Patient Instructions  Medication Instructions:  Your physician recommends that you continue on your current  medications as directed. Please refer to the Current Medication list given to you today.  *If you need a refill on your cardiac medications before your next appointment, please call your pharmacy*   Lab Work: None If you have labs (blood work) drawn today and your tests are completely normal, you will receive your results only by: MyChart Message (if you have MyChart) OR  A paper copy in the mail If you have any lab test that is abnormal or we need to change your treatment, we will call you to review the results.   Testing/Procedures: None   Follow-Up: At [facility], you and your health needs are our priority.  As part of our continuing mission to provide you with exceptional heart care, we have created designated Provider Care Teams.  These Care Teams include your primary Cardiologist (physician) and Advanced Practice Providers (APPs -  Physician Assistants and Nurse Practitioners) who all work together to provide you with the care you need, when you need it.  We recommend signing up for the patient portal called "MyChart".  Sign up information is provided on this After Visit Summary.  MyChart is used to connect with patients for Virtual Visits (Telemedicine).  Patients are able to view lab/test results, encounter notes, upcoming appointments, etc.  Non-urgent messages can be sent to your provider as well.   To learn more about what you can do with MyChart, go to [email or web address].    Your next appointment:   9-12 month(s)  The format for your next appointment:   In Person  Provider:   You may see Lesleigh Noe, MD or one of the following Advanced Practice Providers on your designated Care Team:   Nada Boozer, NP    Other Instructions     Signed, Lesleigh Noe, MD  12/14/2020 10:28 AM    [facility]

## 2020-12-14 ENCOUNTER — Other Ambulatory Visit: Payer: Self-pay

## 2020-12-14 ENCOUNTER — Encounter: Payer: Self-pay | Admitting: Interventional Cardiology

## 2020-12-14 ENCOUNTER — Ambulatory Visit: Payer: Medicare Other | Admitting: Interventional Cardiology

## 2020-12-14 VITALS — BP 138/72 | HR 72 | Ht 66.0 in | Wt 188.0 lb

## 2020-12-14 DIAGNOSIS — I10 Essential (primary) hypertension: Secondary | ICD-10-CM

## 2020-12-14 DIAGNOSIS — E7800 Pure hypercholesterolemia, unspecified: Secondary | ICD-10-CM

## 2020-12-14 DIAGNOSIS — I5032 Chronic diastolic (congestive) heart failure: Secondary | ICD-10-CM

## 2020-12-14 DIAGNOSIS — I2541 Coronary artery aneurysm: Secondary | ICD-10-CM | POA: Diagnosis not present

## 2020-12-14 DIAGNOSIS — I2694 Multiple subsegmental pulmonary emboli without acute cor pulmonale: Secondary | ICD-10-CM | POA: Diagnosis not present

## 2020-12-14 DIAGNOSIS — I214 Non-ST elevation (NSTEMI) myocardial infarction: Secondary | ICD-10-CM | POA: Diagnosis not present

## 2020-12-14 DIAGNOSIS — Z7901 Long term (current) use of anticoagulants: Secondary | ICD-10-CM

## 2020-12-14 NOTE — Patient Instructions (Signed)
Medication Instructions:  Your physician recommends that you continue on your current medications as directed. Please refer to the Current Medication list given to you today.  *If you need a refill on your cardiac medications before your next appointment, please call your pharmacy*   Lab Work: None If you have labs (blood work) drawn today and your tests are completely normal, you will receive your results only by: MyChart Message (if you have MyChart) OR A paper copy in the mail If you have any lab test that is abnormal or we need to change your treatment, we will call you to review the results.   Testing/Procedures: None   Follow-Up: At [facility], you and your health needs are our priority.  As part of our continuing mission to provide you with exceptional heart care, we have created designated Provider Care Teams.  These Care Teams include your primary Cardiologist (physician) and Advanced Practice Providers (APPs -  Physician Assistants and Nurse Practitioners) who all work together to provide you with the care you need, when you need it.  We recommend signing up for the patient portal called "MyChart".  Sign up information is provided on this After Visit Summary.  MyChart is used to connect with patients for Virtual Visits (Telemedicine).  Patients are able to view lab/test results, encounter notes, upcoming appointments, etc.  Non-urgent messages can be sent to your provider as well.   To learn more about what you can do with MyChart, go to [email or web address].    Your next appointment:   9-12 month(s)  The format for your next appointment:   In Person  Provider:   You may see Henry W Smith III, MD or one of the following Advanced Practice Providers on your designated Care Team:   Laura Ingold, NP   Other Instructions   

## 2020-12-29 DIAGNOSIS — H6121 Impacted cerumen, right ear: Secondary | ICD-10-CM | POA: Diagnosis not present

## 2020-12-29 DIAGNOSIS — Z0000 Encounter for general adult medical examination without abnormal findings: Secondary | ICD-10-CM | POA: Diagnosis not present

## 2020-12-29 DIAGNOSIS — D72819 Decreased white blood cell count, unspecified: Secondary | ICD-10-CM | POA: Diagnosis not present

## 2020-12-29 DIAGNOSIS — E785 Hyperlipidemia, unspecified: Secondary | ICD-10-CM | POA: Diagnosis not present

## 2020-12-29 DIAGNOSIS — I10 Essential (primary) hypertension: Secondary | ICD-10-CM | POA: Diagnosis not present

## 2020-12-29 DIAGNOSIS — R7303 Prediabetes: Secondary | ICD-10-CM | POA: Diagnosis not present

## 2020-12-29 DIAGNOSIS — N3281 Overactive bladder: Secondary | ICD-10-CM | POA: Diagnosis not present

## 2020-12-29 DIAGNOSIS — I2510 Atherosclerotic heart disease of native coronary artery without angina pectoris: Secondary | ICD-10-CM | POA: Diagnosis not present

## 2020-12-29 DIAGNOSIS — E039 Hypothyroidism, unspecified: Secondary | ICD-10-CM | POA: Diagnosis not present

## 2020-12-29 DIAGNOSIS — E559 Vitamin D deficiency, unspecified: Secondary | ICD-10-CM | POA: Diagnosis not present

## 2020-12-29 DIAGNOSIS — R29818 Other symptoms and signs involving the nervous system: Secondary | ICD-10-CM | POA: Diagnosis not present

## 2020-12-31 ENCOUNTER — Emergency Department (HOSPITAL_COMMUNITY): Payer: Medicare Other

## 2020-12-31 ENCOUNTER — Encounter (HOSPITAL_COMMUNITY): Payer: Self-pay | Admitting: Emergency Medicine

## 2020-12-31 ENCOUNTER — Inpatient Hospital Stay (HOSPITAL_COMMUNITY)
Admission: EM | Admit: 2020-12-31 | Discharge: 2021-01-05 | DRG: 543 | Disposition: A | Discharge status: Home Health | Payer: Medicare Other | Attending: Surgery | Admitting: Surgery

## 2020-12-31 ENCOUNTER — Other Ambulatory Visit: Payer: Self-pay

## 2020-12-31 DIAGNOSIS — Z974 Presence of external hearing-aid: Secondary | ICD-10-CM

## 2020-12-31 DIAGNOSIS — S62114A Nondisplaced fracture of triquetrum [cuneiform] bone, right wrist, initial encounter for closed fracture: Secondary | ICD-10-CM | POA: Diagnosis not present

## 2020-12-31 DIAGNOSIS — M170 Bilateral primary osteoarthritis of knee: Secondary | ICD-10-CM | POA: Diagnosis not present

## 2020-12-31 DIAGNOSIS — E039 Hypothyroidism, unspecified: Secondary | ICD-10-CM | POA: Diagnosis not present

## 2020-12-31 DIAGNOSIS — R0781 Pleurodynia: Secondary | ICD-10-CM | POA: Diagnosis not present

## 2020-12-31 DIAGNOSIS — Z823 Family history of stroke: Secondary | ICD-10-CM

## 2020-12-31 DIAGNOSIS — Z807 Family history of other malignant neoplasms of lymphoid, hematopoietic and related tissues: Secondary | ICD-10-CM

## 2020-12-31 DIAGNOSIS — M80031A Age-related osteoporosis with current pathological fracture, right forearm, initial encounter for fracture: Secondary | ICD-10-CM | POA: Diagnosis not present

## 2020-12-31 DIAGNOSIS — W109XXA Fall (on) (from) unspecified stairs and steps, initial encounter: Secondary | ICD-10-CM | POA: Diagnosis present

## 2020-12-31 DIAGNOSIS — Z833 Family history of diabetes mellitus: Secondary | ICD-10-CM | POA: Diagnosis not present

## 2020-12-31 DIAGNOSIS — E7800 Pure hypercholesterolemia, unspecified: Secondary | ICD-10-CM | POA: Diagnosis present

## 2020-12-31 DIAGNOSIS — I517 Cardiomegaly: Secondary | ICD-10-CM | POA: Diagnosis not present

## 2020-12-31 DIAGNOSIS — S199XXA Unspecified injury of neck, initial encounter: Secondary | ICD-10-CM | POA: Diagnosis not present

## 2020-12-31 DIAGNOSIS — W19XXXA Unspecified fall, initial encounter: Secondary | ICD-10-CM

## 2020-12-31 DIAGNOSIS — S2249XA Multiple fractures of ribs, unspecified side, initial encounter for closed fracture: Secondary | ICD-10-CM

## 2020-12-31 DIAGNOSIS — S2242XA Multiple fractures of ribs, left side, initial encounter for closed fracture: Secondary | ICD-10-CM

## 2020-12-31 DIAGNOSIS — M79661 Pain in right lower leg: Secondary | ICD-10-CM | POA: Diagnosis not present

## 2020-12-31 DIAGNOSIS — S2243XA Multiple fractures of ribs, bilateral, initial encounter for closed fracture: Secondary | ICD-10-CM | POA: Diagnosis not present

## 2020-12-31 DIAGNOSIS — M1711 Unilateral primary osteoarthritis, right knee: Secondary | ICD-10-CM | POA: Diagnosis not present

## 2020-12-31 DIAGNOSIS — M25531 Pain in right wrist: Secondary | ICD-10-CM | POA: Diagnosis not present

## 2020-12-31 DIAGNOSIS — J9811 Atelectasis: Secondary | ICD-10-CM | POA: Diagnosis not present

## 2020-12-31 DIAGNOSIS — M800AXA Age-related osteoporosis with current pathological fracture, other site, initial encounter for fracture: Secondary | ICD-10-CM | POA: Diagnosis present

## 2020-12-31 DIAGNOSIS — Z86711 Personal history of pulmonary embolism: Secondary | ICD-10-CM

## 2020-12-31 DIAGNOSIS — I252 Old myocardial infarction: Secondary | ICD-10-CM

## 2020-12-31 DIAGNOSIS — I5032 Chronic diastolic (congestive) heart failure: Secondary | ICD-10-CM | POA: Diagnosis present

## 2020-12-31 DIAGNOSIS — R0902 Hypoxemia: Secondary | ICD-10-CM | POA: Diagnosis not present

## 2020-12-31 DIAGNOSIS — M7989 Other specified soft tissue disorders: Secondary | ICD-10-CM | POA: Diagnosis not present

## 2020-12-31 DIAGNOSIS — Z20822 Contact with and (suspected) exposure to covid-19: Secondary | ICD-10-CM | POA: Diagnosis present

## 2020-12-31 DIAGNOSIS — Z841 Family history of disorders of kidney and ureter: Secondary | ICD-10-CM | POA: Diagnosis not present

## 2020-12-31 DIAGNOSIS — M25461 Effusion, right knee: Secondary | ICD-10-CM | POA: Diagnosis not present

## 2020-12-31 DIAGNOSIS — Z7901 Long term (current) use of anticoagulants: Secondary | ICD-10-CM

## 2020-12-31 DIAGNOSIS — Z905 Acquired absence of kidney: Secondary | ICD-10-CM | POA: Diagnosis not present

## 2020-12-31 DIAGNOSIS — Z79899 Other long term (current) drug therapy: Secondary | ICD-10-CM

## 2020-12-31 DIAGNOSIS — Z800 Family history of malignant neoplasm of digestive organs: Secondary | ICD-10-CM

## 2020-12-31 DIAGNOSIS — S80919A Unspecified superficial injury of unspecified knee, initial encounter: Secondary | ICD-10-CM | POA: Diagnosis not present

## 2020-12-31 DIAGNOSIS — R0789 Other chest pain: Secondary | ICD-10-CM | POA: Diagnosis not present

## 2020-12-31 DIAGNOSIS — F419 Anxiety disorder, unspecified: Secondary | ICD-10-CM | POA: Diagnosis present

## 2020-12-31 DIAGNOSIS — Z79890 Hormone replacement therapy (postmenopausal): Secondary | ICD-10-CM

## 2020-12-31 DIAGNOSIS — Z820 Family history of epilepsy and other diseases of the nervous system: Secondary | ICD-10-CM

## 2020-12-31 DIAGNOSIS — M7121 Synovial cyst of popliteal space [Baker], right knee: Secondary | ICD-10-CM | POA: Diagnosis not present

## 2020-12-31 DIAGNOSIS — I110 Hypertensive heart disease with heart failure: Secondary | ICD-10-CM | POA: Diagnosis present

## 2020-12-31 DIAGNOSIS — H9193 Unspecified hearing loss, bilateral: Secondary | ICD-10-CM | POA: Diagnosis not present

## 2020-12-31 DIAGNOSIS — M80061A Age-related osteoporosis with current pathological fracture, right lower leg, initial encounter for fracture: Principal | ICD-10-CM | POA: Diagnosis present

## 2020-12-31 DIAGNOSIS — S82154A Nondisplaced fracture of right tibial tuberosity, initial encounter for closed fracture: Secondary | ICD-10-CM | POA: Diagnosis not present

## 2020-12-31 DIAGNOSIS — Z743 Need for continuous supervision: Secondary | ICD-10-CM | POA: Diagnosis not present

## 2020-12-31 DIAGNOSIS — S62101A Fracture of unspecified carpal bone, right wrist, initial encounter for closed fracture: Secondary | ICD-10-CM | POA: Diagnosis not present

## 2020-12-31 DIAGNOSIS — M7120 Synovial cyst of popliteal space [Baker], unspecified knee: Secondary | ICD-10-CM | POA: Diagnosis not present

## 2020-12-31 DIAGNOSIS — M25551 Pain in right hip: Secondary | ICD-10-CM | POA: Diagnosis not present

## 2020-12-31 DIAGNOSIS — Z7982 Long term (current) use of aspirin: Secondary | ICD-10-CM

## 2020-12-31 DIAGNOSIS — S0990XA Unspecified injury of head, initial encounter: Secondary | ICD-10-CM | POA: Diagnosis not present

## 2020-12-31 DIAGNOSIS — R29898 Other symptoms and signs involving the musculoskeletal system: Secondary | ICD-10-CM | POA: Diagnosis not present

## 2020-12-31 DIAGNOSIS — Z8249 Family history of ischemic heart disease and other diseases of the circulatory system: Secondary | ICD-10-CM | POA: Diagnosis not present

## 2020-12-31 DIAGNOSIS — M25561 Pain in right knee: Secondary | ICD-10-CM | POA: Diagnosis not present

## 2020-12-31 DIAGNOSIS — M25552 Pain in left hip: Secondary | ICD-10-CM | POA: Diagnosis not present

## 2020-12-31 LAB — BASIC METABOLIC PANEL
Anion gap: 7 (ref 5–15)
BUN: 24 mg/dL — ABNORMAL HIGH (ref 8–23)
CO2: 26 mmol/L (ref 22–32)
Calcium: 9.5 mg/dL (ref 8.9–10.3)
Chloride: 106 mmol/L (ref 98–111)
Creatinine, Ser: 1.04 mg/dL — ABNORMAL HIGH (ref 0.44–1.00)
GFR, Estimated: 52 mL/min — ABNORMAL LOW (ref >60)
Glucose, Bld: 96 mg/dL (ref 70–99)
Potassium: 4 mmol/L (ref 3.5–5.1)
Sodium: 139 mmol/L (ref 135–145)

## 2020-12-31 LAB — CBC
HCT: 41.1 % (ref 36.0–46.0)
Hemoglobin: 13 g/dL (ref 12.0–15.0)
MCH: 30.5 pg (ref 26.0–34.0)
MCHC: 31.6 g/dL (ref 30.0–36.0)
MCV: 96.5 fL (ref 80.0–100.0)
Platelets: 173 10*3/uL (ref 150–400)
RBC: 4.26 MIL/uL (ref 3.87–5.11)
RDW: 13.9 % (ref 11.5–15.5)
WBC: 5.8 10*3/uL (ref 4.0–10.5)
nRBC: 0 % (ref 0.0–0.2)

## 2020-12-31 LAB — RESP PANEL BY RT-PCR (FLU A&B, COVID) ARPGX2
Influenza A by PCR: NEGATIVE
Influenza B by PCR: NEGATIVE
SARS Coronavirus 2 by RT PCR: NEGATIVE

## 2020-12-31 MED ORDER — HYDROMORPHONE HCL 1 MG/ML IJ SOLN
0.5000 mg | Freq: Once | INTRAMUSCULAR | Status: AC
Start: 2020-12-31 — End: 2020-12-31
  Administered 2020-12-31: 0.5 mg via INTRAVENOUS

## 2020-12-31 MED ORDER — MORPHINE SULFATE (PF) 4 MG/ML IV SOLN
4.0000 mg | Freq: Once | INTRAVENOUS | Status: AC
  Administered 2020-12-31: 4 mg via INTRAVENOUS
  Filled 2020-12-31: qty 1

## 2020-12-31 NOTE — Progress Notes (Signed)
Orthopedic Tech Progress Note Patient Details:  Lynn Brown March 28, 1932 [id number]  Dropped off splinting supplies and nursing staff was attempting to get an IV started on her to administer medication for pain before I started. Communicated to give ortho tech a call when all is ready.   Patient ID: Lynn Brown, female   DOB: 1931/09/03, 87 y.o.   MRN: [id number]  Docia Furl 12/31/2020, 6:19 PM

## 2020-12-31 NOTE — ED Notes (Signed)
Called ortho for splint application 

## 2020-12-31 NOTE — ED Provider Notes (Signed)
[facility] EMERGENCY DEPARTMENT Provider Note   CSN: [id number] Arrival date & time: 12/31/20  1259     History Chief Complaint  Patient presents with   Lynn Brown is a 87 y.o. female with a past medical history significant for hypothyroidism, hypertension, hypercholesteremia, history of NSTEMI, history of PE on chronic Eliquis who presents to the ED after mechanical fall that occurred yesterday evening.  Patient states she slipped down 2 stairs landing directly on her right wrist.  Patient endorses right wrist pain, left/right rib pain, and right lower extremity pain.  Patient states after the mechanical fall she was having difficulties ambulating due to RLE pain. Patient typically ambulates with a cane occasionally at baseline.  No head injury or loss of consciousness.  Patient denies neck pain.  Denies headache, visual changes, speech changes, unilateral weakness.  Admits to rib pain with deep inspiration.  Pain is worse with palpation.  No treatment prior to arrival.  History obtained from patient, daughter and past medical records. No interpreter used during encounter.       Past Medical History:  Diagnosis Date   Allergic rhinitis    Anxiety    Bunion    PES PLANUS   Hearing loss    HEARING AIDS, BUT DOES NOT WEAR THEM CONSISTENTLY.    Hypercholesteremia    Hypertension    Hypothyroid    Leaky heart valve    DR. HARWANI   Mild obesity    Osteoarthritis of knees, bilateral    Osteoporosis 12/2017   T score -3.3   Venous insufficiency     Patient Active Problem List   Diagnosis Date Noted   History of pulmonary embolism 11/18/2020   Pulmonary emboli (HCC) 08/09/2020   SOB (shortness of breath)    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   Vitreomacular traction, right 04/14/2020   Macular retinoschisis, right 04/14/2020   Coronary aneurysm 06/26/2018   NSTEMI (non-ST elevated myocardial infarction) (HCC) 03/09/2018   Chronic  diastolic heart failure (HCC) 09/25/2017   Hypothyroid    Essential hypertension    Hypercholesteremia    Osteopenia 04/27/2008    Past Surgical History:  Procedure Laterality Date   ABDOMINAL AORTOGRAM W/LOWER EXTREMITY Left 05/04/2020   Procedure: ABDOMINAL AORTOGRAM W/LOWER EXTREMITY;  Surgeon: Leonie Douglas, MD;  Location: [facility] INVASIVE CV [facility];  Service: Cardiovascular;  Laterality: Left;   CATARACT EXTRACTION, BILATERAL  2013   DR. SHAPIRO    HYSTEROSCOPY     D & C   LEFT HEART CATH AND CORONARY ANGIOGRAPHY N/A 03/10/2018   Procedure: LEFT HEART CATH AND CORONARY ANGIOGRAPHY;  Surgeon: Lyn Records, MD;  Location: [facility] IN[facility] CV [facility];  Service: Cardiovascular;  Laterality: N/A;   NEPHRECTOMY LIVING DONOR  1978   PERIPHERAL VASCULAR INTERVENTION Left 05/04/2020   Procedure: PERIPHERAL VASCULAR INTERVENTION;  Surgeon: Leonie Douglas, MD;  Location: [facility];  Service: Cardiovascular;  Laterality: Left;  Femoral popliteal     OB History     Gravida  5   Para  4   Term      Preterm      AB  1   Living  4      SAB  1   IAB      Ectopic      Multiple      Live Births              Family History  Problem  Relation Age of Onset   Hypertension Mother    CVA Mother    Diabetes Sister    Hypertension Sister    Heart disease Sister    Multiple sclerosis Sister    Hypertension Father    CVA Father    Cancer Brother        prostrate   Hypertension Brother    CVA Brother    Seizures Brother    Pancreatic cancer Sister    Kidney disease Sister    Kidney failure Sister    Other Brother        PNEUMONIA AS A BABY   Hypertension Brother    Lymphoma Daughter    Deep vein thrombosis Sister    Pulmonary embolism Niece    Breast cancer Neg Hx     Social History   Tobacco Use   Smoking status: Never   Smokeless tobacco: Never  Vaping Use   Vaping Use: Never used  Substance Use Topics   Alcohol use: No    Alcohol/week: 0.0 standard  drinks   Drug use: No    Home Medications Prior to Admission medications   Medication Sig Start Date End Date Taking? Authorizing Provider  Acetaminophen 500 MG capsule Take 500 mg by mouth every 6 (six) hours as needed for fever or pain.    [provider]  apixaban (ELIQUIS) 5 MG TABS tablet Take 1 tablet (5 mg total) by mouth 2 (two) times daily. 08/17/20   Briant Cedarhayil, Irene T, PA-C  ascorbic acid (VITAMIN C) 500 MG tablet Take 500 mg by mouth in the morning and at bedtime.    [provider]  aspirin EC 81 MG tablet Take 1 tablet (81 mg total) by mouth daily. Swallow whole. 02/05/20   Lyn RecordsSmith, Henry W, MD  Calcium Citrate-Vitamin D [phone] MG-UNIT TABS Take 1 tablet by mouth daily.    [provider]  cetirizine (ZYRTEC) 10 MG tablet Take 10 mg by mouth daily as needed (allergies.).  09/07/19   [provider]  Cholecalciferol (VITAMIN D) 2000 UNITS tablet Take 2,000 Units by mouth daily.    [provider]  clorazepate (TRANXENE) 7.5 MG tablet Take 7.5 mg by mouth daily as needed for anxiety.     [provider]  Coenzyme Q10 (COQ10) 50 MG CAPS Take 50 mg by mouth daily at 2 PM.    [provider]  furosemide (LASIX) 20 MG tablet Take 20 mg by mouth daily as needed.    [provider]  levothyroxine (SYNTHROID) 112 MCG tablet Take 112 mcg by mouth every morning. 06/30/20   [provider]  magnesium oxide (MAG-OX) 400 MG tablet Take 400 mg by mouth every evening.     [provider]  metoprolol tartrate (LOPRESSOR) 25 MG tablet Take 0.5 tablets (12.5 mg total) by mouth 2 (two) times daily. 03/12/18   Albertine GratesXu, Fang, MD  nitroGLYCERIN (NITROSTAT) 0.4 MG SL tablet Place 1 tablet (0.4 mg total) under the tongue every 5 (five) minutes as needed for chest pain. 03/12/18   Albertine GratesXu, Fang, MD  rosuvastatin (CRESTOR) 20 MG tablet Take 20 mg by mouth every evening.     [provider]  spironolactone (ALDACTONE) 25 MG  tablet TAKE 1 TABLET BY MOUTH  DAILY 03/29/20   Lyn RecordsSmith, Henry W, MD    Allergies    Patient has no known allergies.  Review of Systems   Review of Systems  Eyes:  Negative for visual disturbance.  Respiratory:  Negative for shortness of breath.   Cardiovascular:  Positive for chest pain (rib pain).  Gastrointestinal:  Negative for abdominal pain, nausea and vomiting.  Musculoskeletal:  Positive for arthralgias, back pain (chronic low back pain), gait problem and joint swelling.  Neurological:  Negative for numbness and headaches.  All other systems reviewed and are negative.  Physical Exam Updated Vital Signs BP 127/74   Pulse 71   Temp 98.3 F (36.8 C)   Resp 20   Ht 5\' 1"  (1.549 m)   Wt 85.3 kg   SpO2 90%   BMI 35.52 kg/m   Physical Exam Vitals and nursing note reviewed.  Constitutional:      General: She is not in acute distress.    Appearance: She is not ill-appearing.  HENT:     Head: Normocephalic.  Eyes:     Pupils: Pupils are equal, round, and reactive to light.  Neck:     Comments: No cervical midline tenderness. Cardiovascular:     Rate and Rhythm: Normal rate and regular rhythm.     Pulses: Normal pulses.     Heart sounds: Normal heart sounds. No murmur heard.   No friction rub. No gallop.  Pulmonary:     Effort: Pulmonary effort is normal.     Breath sounds: Normal breath sounds.  Chest:     Comments: TTP to left side of chest wall without crepitus or deformity Abdominal:     General: Abdomen is flat. There is no distension.     Palpations: Abdomen is soft.     Tenderness: There is no abdominal tenderness. There is no guarding or rebound.  Musculoskeletal:        General: Normal range of motion.     Cervical back: Neck supple.     Comments: Erythema and tenderness to dorsal aspect of right wrist. Radial pulse intact.  No snuffbox tenderness.  Decreased range of motion of right wrist.  No tenderness over right elbow or hand/fingers.  No thoracic or  lumbar midline tenderness. RLE externally rotated; however patient notes this is chronic. No hip tenderness bilaterally.  Skin:    General: Skin is warm and dry.     Comments: Chronic skin changes to bilateral lower extremities  Neurological:     General: No focal deficit present.     Mental Status: She is alert.  Psychiatric:        Mood and Affect: Mood normal.        Behavior: Behavior normal.    ED Results / Procedures / Treatments   Labs (all labs ordered are listed, but only abnormal results are displayed) Labs Reviewed  BASIC METABOLIC PANEL - Abnormal; Notable for the following components:      Result Value   BUN 24 (*)    Creatinine, Ser 1.04 (*)    GFR, Estimated 52 (*)    All other components within normal limits  RESP PANEL BY RT-PCR (FLU A&B, COVID) [id number]  CBC    EKG None  Radiology DG Ribs Unilateral Right  Result Date: 12/31/2020 CLINICAL DATA:  Acute RIGHT chest and rib pain following fall. Initial encounter. EXAM: RIGHT RIBS - 2 VIEW COMPARISON:  08/09/2020 radiographs FINDINGS: No fracture or other bone lesions are seen involving the ribs. IMPRESSION: Negative. Electronically Signed   By: 08/11/2020 M.D.   On: 12/31/2020 15:12   DG Ribs Unilateral W/Chest Left  Result Date: 12/31/2020 CLINICAL DATA:  Fall, wrist and knee fracture.  Left chest pain. EXAM:  LEFT RIBS AND CHEST - 3+ VIEW COMPARISON:  08/09/2020 FINDINGS: There are acute minimally displaced fractures of the left sixth and seventh ribs laterally. Remote healed fractures of the third and fourth ribs noted. Lung volumes are small and there is bibasilar atelectasis. No pneumothorax or pleural effusion. Cardiac size within normal limits given poor pulmonary insufflation. Pulmonary vascularity is normal. IMPRESSION: Acute left sixth and seventh rib fractures.  No pneumothorax. Electronically Signed   By: Helyn Numbers MD   On: 12/31/2020 15:08   DG Wrist Complete Right  Result Date: 12/31/2020 CLINICAL  DATA:  Fall.  Pain in the RIGHT wrist. EXAM: RIGHT WRIST - COMPLETE 3+ VIEW COMPARISON:  None. FINDINGS: There is lucency traversing the triquetrum and mild irregularity along the dorsum of the wrist on the LATERAL view. Findings raise a question of triquetrum fracture. Alignment of the carpals is otherwise normal. No radiopaque foreign body. IMPRESSION: Possible triquetrum fracture. Electronically Signed   By: Norva Pavlov M.D.   On: 12/31/2020 15:09   DG Tibia/Fibula Right  Result Date: 12/31/2020 CLINICAL DATA:  Larey Seat yesterday. RIGHT hip pain. RIGHT LOWER leg pain. EXAM: RIGHT TIBIA AND FIBULA - 2 VIEW COMPARISON:  None. FINDINGS: There are marked degenerative changes involving MEDIAL, LATERAL, in patellofemoral compartment of the knee. There is no acute fracture or subluxation. Note is made of pes planus. Soft tissues are unremarkable. IMPRESSION: Significant degenerative changes at the knee. No evidence for acute abnormality. Electronically Signed   By: Norva Pavlov M.D.   On: 12/31/2020 16:59   CT HEAD WO CONTRAST ( )  Result Date: 12/31/2020 CLINICAL DATA:  87 year old female with head and neck injury from fall today. Initial encounter. EXAM: CT HEAD WITHOUT CONTRAST CT CERVICAL SPINE WITHOUT CONTRAST TECHNIQUE: Multidetector CT imaging of the head and cervical spine was performed following the standard protocol without intravenous contrast. Multiplanar CT image reconstructions of the cervical spine were also generated. COMPARISON:  08/23/2004 cervical spine radiographs FINDINGS: CT HEAD FINDINGS Brain: No evidence of acute infarction, hemorrhage, hydrocephalus, extra-axial collection or mass lesion/mass effect. Atrophy, probable mild chronic small-vessel white matter ischemic changes and bilateral globus pallidus calcifications noted. Vascular: Carotid atherosclerotic calcifications are noted. Skull: Normal. Negative for fracture or focal lesion. Sinuses/Orbits: No acute abnormality. Mucosal  thickening within the RIGHT maxillary sinus noted. Other: None CT CERVICAL SPINE FINDINGS Alignment: Normal. Skull base and vertebrae: No acute fracture. No primary bone lesion or focal pathologic process. Soft tissues and spinal canal: No prevertebral fluid or swelling. No visible canal hematoma. Disc levels: Moderate multilevel degenerative disc disease, spondylosis and facet arthropathy noted contributing to mild central spinal and mild to moderate bony foraminal narrowing at multiple levels. Upper chest: No acute abnormality Other: None IMPRESSION: 1. No evidence of acute intracranial abnormality. Atrophy and probable mild chronic small-vessel white matter ischemic changes. 2. No static evidence of acute injury to the cervical spine. Moderate multilevel degenerative changes. Electronically Signed   By: Harmon Pier M.D.   On: 12/31/2020 17:18   CT Cervical Spine Wo Contrast  Result Date: 12/31/2020 CLINICAL DATA:  87 year old female with head and neck injury from fall today. Initial encounter. EXAM: CT HEAD WITHOUT CONTRAST CT CERVICAL SPINE WITHOUT CONTRAST TECHNIQUE: Multidetector CT imaging of the head and cervical spine was performed following the standard protocol without intravenous contrast. Multiplanar CT image reconstructions of the cervical spine were also generated. COMPARISON:  08/23/2004 cervical spine radiographs FINDINGS: CT HEAD FINDINGS Brain: No evidence of acute infarction, hemorrhage, hydrocephalus, extra-axial collection  or mass lesion/mass effect. Atrophy, probable mild chronic small-vessel white matter ischemic changes and bilateral globus pallidus calcifications noted. Vascular: Carotid atherosclerotic calcifications are noted. Skull: Normal. Negative for fracture or focal lesion. Sinuses/Orbits: No acute abnormality. Mucosal thickening within the RIGHT maxillary sinus noted. Other: None CT CERVICAL SPINE FINDINGS Alignment: Normal. Skull base and vertebrae: No acute fracture. No  primary bone lesion or focal pathologic process. Soft tissues and spinal canal: No prevertebral fluid or swelling. No visible canal hematoma. Disc levels: Moderate multilevel degenerative disc disease, spondylosis and facet arthropathy noted contributing to mild central spinal and mild to moderate bony foraminal narrowing at multiple levels. Upper chest: No acute abnormality Other: None IMPRESSION: 1. No evidence of acute intracranial abnormality. Atrophy and probable mild chronic small-vessel white matter ischemic changes. 2. No static evidence of acute injury to the cervical spine. Moderate multilevel degenerative changes. Electronically Signed   By: Harmon Pier M.D.   On: 12/31/2020 17:18   CT Knee Right Wo Contrast  Result Date: 12/31/2020 CLINICAL DATA:  Limping, acute, right knee pain EXAM: CT OF THE radiograph 12/31/2020 KNEE WITHOUT CONTRAST TECHNIQUE: Multidetector CT imaging of the RIGHT knee was performed according to the standard protocol. Multiplanar CT image reconstructions were also generated. COMPARISON:  Radiograph 12/31/2020 FINDINGS: Bones/Joint/Cartilage Moderate to severe tricompartmental degenerative changes including essentially bone-on-bone contact in the significantly narrowed medial femorotibial and patellofemoral compartments with exuberant periarticular spurring. More moderate change seen in the partly narrowed lateral femorotibial compartment as well. Corticated joint body versus fabella posteriorly. Subcortical sclerosis involving the medial tibial plateau concerning for an underlying osteochondral lesion. Conspicuous linear lucency concerning for nondisplaced fracture seen extending through the posteromedial corner on sagittal imaging and into the exuberant posterior osteophytes (10/75). Large knee joint effusion with evidence of synovitis. Partially decompressing into a Baker's cyst posteriorly demonstrating a layering fluid-fluid level. Ligaments Suboptimally assessed by CT.  Muscles and Tendons No clear musculotendinous injury.  No retracted or torn tendons. Soft tissues Soft tissue swelling of the knee is most pronounced anteriorly. Joint effusion and bearing Baker's cyst, as above. Extensive atherosclerotic calcification. Vasculature incompletely assessed. IMPRESSION: Diffuse bony demineralization, may limit detection of subtle injury. Features concerning for a nondisplaced fracture lucency through the posteromedial corner of the tibial plateau. Additional subcortical sclerotic changes in a configuration suspicious for osteochondral lesion/spontaneous osteonecrosis of the medial tibial plateau weight-bearing articular surface as well. Large effusion partially decompressing into a Baker's cyst with evidence of synovitis and layering fluid fluid levels which can reflect hemarthrosis in the setting of fracture. Moderate to severe tricompartmental degenerative changes most pronounced in the medial femorotibial and patellofemoral compartments where there is essentially complete effacement of the joint space. Atherosclerosis. Electronically Signed   By: Kreg Shropshire M.D.   On: 12/31/2020 19:21   DG Knee Complete 4 Views Right  Result Date: 12/31/2020 CLINICAL DATA:  87 year old female with acute RIGHT knee pain following fall today. Initial encounter. EXAM: RIGHT KNEE - COMPLETE 4+ VIEW COMPARISON:  12/08/2016 FINDINGS: No acute fracture or dislocation. Severe tricompartmental joint space narrowing and osteophytosis noted. No joint effusion is present. IMPRESSION: 1. No acute abnormality. 2. Severe tricompartmental degenerative changes. Electronically Signed   By: Harmon Pier M.D.   On: 12/31/2020 15:06   DG HIP UNILAT WITH PELVIS 2-3 VIEWS LEFT  Result Date: 12/31/2020 CLINICAL DATA:  Larey Seat yesterday.  Pain. EXAM: DG HIP (WITH OR WITHOUT PELVIS) 2-3V LEFT COMPARISON:  None. FINDINGS: There is no acute fracture or subluxation. Femoral artery calcification  and vascular stents noted.  Moderate stool burden in the rectosigmoid colon. There is sclerosis of the symphysis pubis. IMPRESSION: No evidence for acute  abnormality. Electronically Signed   By: Norva Pavlov M.D.   On: 12/31/2020 17:00   DG HIP UNILAT WITH PELVIS 2-3 VIEWS RIGHT  Result Date: 12/31/2020 CLINICAL DATA:  Larey Seat yesterday.  RIGHT hip pain. EXAM: DG HIP (WITH OR WITHOUT PELVIS) 2-3V RIGHT COMPARISON:  Pelvis 12/31/2020 FINDINGS: There is no acute fracture or subluxation. Note is made of dense atherosclerotic calcification of the femoral artery. There is sclerosis of the symphysis pubis. IMPRESSION: No evidence for acute  abnormality. Electronically Signed   By: Norva Pavlov M.D.   On: 12/31/2020 17:01    Procedures Procedures   Medications Ordered in ED Medications  morphine 4 MG/ML injection 4 mg (4 mg Intravenous Given 12/31/20 1812)    ED Course  I have reviewed the triage vital signs and the nursing notes.  Pertinent labs & imaging results that were available during my care of the patient were reviewed by me and considered in my medical decision making (see chart for details).    MDM Rules/Calculators/A&P                          87 year old female presents to the ED after mechanical fall that occurred yesterday evening.  Patient slipped down 2 stairs landing directly on her right wrist.  Patient endorses right wrist pain, left-sided rib pain, and right lower extremity pain.  No head injury or loss of consciousness.  She is on chronic Eliquis.  Upon arrival, stable vitals.  Patient in no acute distress.  Physical exam significant for tenderness throughout right wrist, left side of ribs without crepitus or deformity.  Right lower extremity externally rotated however, patient notes this is a chronic finding.  Normal neurological exam.  No cervical, thoracic, or lumbar midline tenderness.  Abdomen soft, nondistended, nontender.  Routine labs, rib x-rays, right knee, and right wrist x-rays ordered at  triage.  We will add CT head and cervical spine.  Bilateral hip and right tib-fib x-ray ordered to rule out bony fracture.  IV morphine given. Discussed case with Dr. Criss Alvine who evaluated patient at bedside and agrees with assessment and plan.   CBC reassuring with no leukocytosis and normal hemoglobin.  BMP significant for mild elevated creatinine at 1.04 and BUN at 24.  No major electrolyte derangements.  Right knee and right-sided ribs personally reviewed which are negative for any bony fractures or acute abnormalities.  Right wrist fracture demonstrates possible triquetrium fracture. Given significant tenderness, high suspicion for fracture. Patient placed in sugar tong splint. Will have patient follow-up with Dr. Melvyn Novas in the outpatient setting for further evaluation.  Left-sided rib x-ray demonstrates acute sixth and seventh rib fracture without pneumothorax. Patient given incentive spirometer.   Patient unable to ambulate following the fall and notes most of her pain is around her knee. Will obtain CT knee to rule out occult fracture.   CT knee personally reviewed which demonstrates: IMPRESSION:  Diffuse bony demineralization, may limit detection of subtle injury.     Features concerning for a nondisplaced fracture lucency through the  posteromedial corner of the tibial plateau.     Additional subcortical sclerotic changes in a configuration  suspicious for osteochondral lesion/spontaneous osteonecrosis of the  medial tibial plateau weight-bearing articular surface as well.     Large effusion partially decompressing into a Baker's cyst with  evidence of synovitis and layering fluid fluid levels which can  reflect hemarthrosis in the setting of fracture.     Moderate to severe tricompartmental degenerative changes most  pronounced in the medial femorotibial and patellofemoral  compartments where there is essentially complete effacement of the  joint space.   Discussed case with  Dr. Donell Beers with trauma surgery who recommends reaching out to orthopedics to see their thoughts on right knee CT to confirm fracture.  Discussed with Dr. Ave Filter with orthopedics who reviewed the CT scan and notes knee is most likely fractured. He recommends knee immobilizer. He will see patient in the AM.   Dr. Donell Beers with trauma agrees to admit patient for further treatment.  Final Clinical Impression(s) / ED Diagnoses Final diagnoses:  Fall  Nondisplaced fracture of triquetrum (cuneiform) bone, right wrist, initial encounter for closed fracture  Closed fracture of multiple ribs of left side, initial encounter    Rx / DC Orders ED Discharge Orders     None        Jesusita Oka 12/31/20 2103    Pricilla Loveless, MD 12/31/20 2148

## 2020-12-31 NOTE — Progress Notes (Signed)
Orthopedic Tech Progress Note Patient Details:  Lynn Brown [birth year] [id number]   Ortho Devices Type of Ortho Device: Knee Immobilizer Ortho Device/Splint Location: RLE Ortho Device/Splint Interventions: Ordered, Application, Adjustment   Post Interventions Patient Tolerated: Well Instructions Provided: Care of device, Adjustment of device  Lynn Brown Carmine Savoy 12/31/2020, 9:25 PM

## 2020-12-31 NOTE — ED Notes (Signed)
Pt transported to CT ?

## 2020-12-31 NOTE — H&P (Signed)
History   Lynn Brown is an 87 y.o. female.   Chief Complaint:  Chief Complaint  Patient presents with   Fall    Pt is an 87 yo F who came in as an unleveled trauma following a fall that occurred yesterday (12/30/20).  She was going downstairs and missed two steps, striking her left chest, right wrist, and right knee.  She had knee pain, chest pain, and wrist pain.  She was able to get to bed last night, but had issues sleeping due to pain.  Once her daughter came to see her and found out what happened, she came to ED. She had increasing difficulty bearing weight this AM and was sore all over. ED workup showed two rib fractures, and possible wrist fracture and possible tibial plateau fractures.  Given continued issues with weight bearing, we are asked to admit.  She denies any falls in the past few years until this one.  She had no dizziness or syncope surrounding the fall.  She states that she was talking to her son and wasn't paying close attention to what she was doing.     Past Medical History:  Diagnosis Date   Allergic rhinitis    Anxiety    Bunion    PES PLANUS   Hearing loss    HEARING AIDS, BUT DOES NOT WEAR THEM CONSISTENTLY.    Hypercholesteremia    Hypertension    Hypothyroid    Leaky heart valve    DR. HARWANI   Mild obesity    Osteoarthritis of knees, bilateral    Osteoporosis 12/2017   T score -3.3   Venous insufficiency     Past Surgical History:  Procedure Laterality Date   ABDOMINAL AORTOGRAM W/LOWER EXTREMITY Left 05/04/2020   Procedure: ABDOMINAL AORTOGRAM W/LOWER EXTREMITY;  Surgeon: Leonie Douglas, MD;  Location: [facility];  Service: Cardiovascular;  Laterality: Left;   CATARACT EXTRACTION, BILATERAL  2013   DR. SHAPIRO    HYSTEROSCOPY     D & C   LEFT HEART CATH AND CORONARY ANGIOGRAPHY N/A 03/10/2018   Procedure: LEFT HEART CATH AND CORONARY ANGIOGRAPHY;  Surgeon: Lyn Records, MD;  Location: [facility];  Service: Cardiovascular;   Laterality: N/A;   NEPHRECTOMY LIVING DONOR  1978   PERIPHERAL VASCULAR INTERVENTION Left 05/04/2020   Procedure: PERIPHERAL VASCULAR INTERVENTION;  Surgeon: Leonie Douglas, MD;  Location: [facility];  Service: Cardiovascular;  Laterality: Left;  Femoral popliteal    Family History  Problem Relation Age of Onset   Hypertension Mother    CVA Mother    Diabetes Sister    Hypertension Sister    Heart disease Sister    Multiple sclerosis Sister    Hypertension Father    CVA Father    Cancer Brother        prostrate   Hypertension Brother    CVA Brother    Seizures Brother    Pancreatic cancer Sister    Kidney disease Sister    Kidney failure Sister    Other Brother        PNEUMONIA AS A BABY   Hypertension Brother    Lymphoma Daughter    Deep vein thrombosis Sister    Pulmonary embolism Niece    Breast cancer Neg Hx    Social History:  reports that she has never smoked. She has never used smokeless tobacco. She reports that she does not drink alcohol and does not use  drugs.  Allergies  No Known Allergies  Home Medications   Current Meds  Medication Sig   Acetaminophen 500 MG capsule Take 500 mg by mouth every 6 (six) hours as needed for pain.   apixaban (ELIQUIS) 5 MG TABS tablet Take 1 tablet (5 mg total) by mouth 2 (two) times daily.   ascorbic acid (VITAMIN C) 500 MG tablet Take 500 mg by mouth in the morning and at bedtime.   aspirin EC 81 MG tablet Take 1 tablet (81 mg total) by mouth daily. Swallow whole.   Calcium Citrate-Vitamin D 315-250 MG-UNIT TABS Take 1 tablet by mouth daily.   cetirizine (ZYRTEC) 10 MG tablet Take 10 mg by mouth daily as needed (allergies.).    clorazepate (TRANXENE) 7.5 MG tablet Take 7.5 mg by mouth daily as needed for anxiety.    Coenzyme Q10 (COQ10 PO) Take 1 capsule by mouth daily at 2 PM.   furosemide (LASIX) 20 MG tablet Take 20 mg by mouth every morning.   levothyroxine (SYNTHROID) 112 MCG tablet Take 112 mcg by mouth every  morning.   magnesium oxide (MAG-OX) 400 MG tablet Take 400 mg by mouth every evening.    metoprolol tartrate (LOPRESSOR) 25 MG tablet Take 0.5 tablets (12.5 mg total) by mouth 2 (two) times daily.   nitroGLYCERIN (NITROSTAT) 0.4 MG SL tablet Place 1 tablet (0.4 mg total) under the tongue every 5 (five) minutes as needed for chest pain.   rosuvastatin (CRESTOR) 20 MG tablet Take 20 mg by mouth every evening.    VITAMIN D PO Take 1 tablet by mouth daily at 2 PM.     Trauma Course   Results for orders placed or performed during the hospital encounter of 12/31/20 (from the past 48 hour(s))  CBC     Status: None   Collection Time: 12/31/20  1:59 PM  Result Value Ref Range   WBC 5.8 4.0 - 10.5 K/uL   RBC 4.26 3.87 - 5.11 MIL/uL   Hemoglobin 13.0 12.0 - 15.0 g/dL   HCT 21.3 08.6 - 57.8 %   MCV 96.5 80.0 - 100.0 fL   MCH 30.5 26.0 - 34.0 pg   MCHC 31.6 30.0 - 36.0 g/dL   RDW 46.9 62.9 - 52.8 %   Platelets 173 150 - 400 K/uL   nRBC 0.0 0.0 - 0.2 %    Comment: Performed at [facility], [address], [place], [place] [address]  Basic metabolic panel     Status: Abnormal   Collection Time: 12/31/20  1:59 PM  Result Value Ref Range   Sodium 139 135 - 145 mmol/L   Potassium 4.0 3.5 - 5.1 mmol/L   Chloride 106 98 - 111 mmol/L   CO2 26 22 - 32 mmol/L   Glucose, Bld 96 70 - 99 mg/dL    Comment: Glucose reference range applies only to samples taken after fasting for at least 8 hours.   BUN 24 (H) 8 - 23 mg/dL   Creatinine, Ser 4.01 (H) 0.44 - 1.00 mg/dL   Calcium 9.5 8.9 - 02.7 mg/dL   GFR, Estimated 52 (L) >60 mL/min    Comment: (NOTE) Calculated using the CKD-EPI Creatinine Equation (2021)    Anion gap 7 5 - 15    Comment: Performed at [facility], [address], [place], [place] [address]  Resp Panel by RT-PCR (Flu A&B, Covid) Nasopharyngeal Swab     Status: None   Collection Time: 12/31/20  8:01  PM   Specimen: Nasopharyngeal Swab; Nasopharyngeal(NP) swabs in vial  transport medium  Result Value Ref Range   SARS Coronavirus 2 by RT PCR NEGATIVE NEGATIVE    Comment: (NOTE) SARS-CoV-2 target nucleic acids are NOT DETECTED.  The SARS-CoV-2 RNA is generally detectable in upper respiratory specimens during the acute phase of infection. The lowest concentration of SARS-CoV-2 viral copies this assay can detect is 138 copies/mL. A negative result does not preclude SARS-Cov-2 infection and should not be used as the sole basis for treatment or other patient management decisions. A negative result may occur with  improper specimen collection/handling, submission of specimen other than nasopharyngeal swab, presence of viral mutation(s) within the areas targeted by this assay, and inadequate number of viral copies(<138 copies/mL). A negative result must be combined with clinical observations, patient history, and epidemiological information. The expected result is Negative.  Fact Sheet for Patients:  BloggerCourse.com  Fact Sheet for Healthcare Providers:  SeriousBroker.it  This test is no t yet approved or cleared by the [place] FDA and  has been authorized for detection and/or diagnosis of SARS-CoV-2 by FDA under an Emergency Use Authorization (EUA). This EUA will remain  in effect (meaning this test can be used) for the duration of the COVID-19 declaration under Section 564(b)(1) of the Act, 21 U.S.C.section 360bbb-3(b)(1), unless the authorization is terminated  or revoked sooner.       Influenza A by PCR NEGATIVE NEGATIVE   Influenza B by PCR NEGATIVE NEGATIVE    Comment: (NOTE) The Xpert Xpress SARS-CoV-2/FLU/RSV plus assay is intended as an aid in the diagnosis of influenza from Nasopharyngeal swab specimens and should not be used as a sole basis for treatment. Nasal washings and aspirates are unacceptable for Xpert Xpress SARS-CoV-2/FLU/RSV testing.  Fact Sheet for  Patients: BloggerCourse.com  Fact Sheet for Healthcare Providers: SeriousBroker.it  This test is not yet approved or cleared by the [place] FDA and has been authorized for detection and/or diagnosis of SARS-CoV-2 by FDA under an Emergency Use Authorization (EUA). This EUA will remain in effect (meaning this test can be used) for the duration of the COVID-19 declaration under Section 564(b)(1) of the Act, 21 U.S.C. section 360bbb-3(b)(1), unless the authorization is terminated or revoked.  Performed at [facility], [address], [place], [place] [address]    DG Ribs Unilateral Right  Result Date: 12/31/2020 CLINICAL DATA:  Acute RIGHT chest and rib pain following fall. Initial encounter. EXAM: RIGHT RIBS - 2 VIEW COMPARISON:  08/09/2020 radiographs FINDINGS: No fracture or other bone lesions are seen involving the ribs. IMPRESSION: Negative. Electronically Signed   By: Harmon Pier M.D.   On: 12/31/2020 15:12   DG Ribs Unilateral W/Chest Left  Result Date: 12/31/2020 CLINICAL DATA:  Fall, wrist and knee fracture.  Left chest pain. EXAM: LEFT RIBS AND CHEST - 3+ VIEW COMPARISON:  08/09/2020 FINDINGS: There are acute minimally displaced fractures of the left sixth and seventh ribs laterally. Remote healed fractures of the third and fourth ribs noted. Lung volumes are small and there is bibasilar atelectasis. No pneumothorax or pleural effusion. Cardiac size within normal limits given poor pulmonary insufflation. Pulmonary vascularity is normal. IMPRESSION: Acute left sixth and seventh rib fractures.  No pneumothorax. Electronically Signed   By: Helyn Numbers MD   On: 12/31/2020 15:08   DG Wrist Complete Right  Result Date: 12/31/2020 CLINICAL DATA:  Fall.  Pain in the RIGHT wrist. EXAM: RIGHT WRIST - COMPLETE 3+ VIEW COMPARISON:  None. FINDINGS: There is lucency traversing the triquetrum and mild irregularity along the dorsum of  the wrist on the LATERAL view. Findings raise a question of triquetrum fracture. Alignment of the carpals is otherwise normal. No radiopaque foreign body. IMPRESSION: Possible triquetrum fracture. Electronically Signed   By: Norva PavlovElizabeth  Brown M.D.   On: 12/31/2020 15:09   DG Tibia/Fibula Right  Result Date: 12/31/2020 CLINICAL DATA:  Larey SeatFell yesterday. RIGHT hip pain. RIGHT LOWER leg pain. EXAM: RIGHT TIBIA AND FIBULA - 2 VIEW COMPARISON:  None. FINDINGS: There are marked degenerative changes involving MEDIAL, LATERAL, in patellofemoral compartment of the knee. There is no acute fracture or subluxation. Note is made of pes planus. Soft tissues are unremarkable. IMPRESSION: Significant degenerative changes at the knee. No evidence for acute abnormality. Electronically Signed   By: Norva PavlovElizabeth  Brown M.D.   On: 12/31/2020 16:59   CT HEAD WO CONTRAST (5MM)  Result Date: 12/31/2020 CLINICAL DATA:  87 year old female with head and neck injury from fall today. Initial encounter. EXAM: CT HEAD WITHOUT CONTRAST CT CERVICAL SPINE WITHOUT CONTRAST TECHNIQUE: Multidetector CT imaging of the head and cervical spine was performed following the standard protocol without intravenous contrast. Multiplanar CT image reconstructions of the cervical spine were also generated. COMPARISON:  08/23/2004 cervical spine radiographs FINDINGS: CT HEAD FINDINGS Brain: No evidence of acute infarction, hemorrhage, hydrocephalus, extra-axial collection or mass lesion/mass effect. Atrophy, probable mild chronic small-vessel white matter ischemic changes and bilateral globus pallidus calcifications noted. Vascular: Carotid atherosclerotic calcifications are noted. Skull: Normal. Negative for fracture or focal lesion. Sinuses/Orbits: No acute abnormality. Mucosal thickening within the RIGHT maxillary sinus noted. Other: None CT CERVICAL SPINE FINDINGS Alignment: Normal. Skull base and vertebrae: No acute fracture. No primary bone lesion or focal  pathologic process. Soft tissues and spinal canal: No prevertebral fluid or swelling. No visible canal hematoma. Disc levels: Moderate multilevel degenerative disc disease, spondylosis and facet arthropathy noted contributing to mild central spinal and mild to moderate bony foraminal narrowing at multiple levels. Upper chest: No acute abnormality Other: None IMPRESSION: 1. No evidence of acute intracranial abnormality. Atrophy and probable mild chronic small-vessel white matter ischemic changes. 2. No static evidence of acute injury to the cervical spine. Moderate multilevel degenerative changes. Electronically Signed   By: Harmon PierJeffrey  Hu M.D.   On: 12/31/2020 17:18   CT Cervical Spine Wo Contrast  Result Date: 12/31/2020 CLINICAL DATA:  87 year old female with head and neck injury from fall today. Initial encounter. EXAM: CT HEAD WITHOUT CONTRAST CT CERVICAL SPINE WITHOUT CONTRAST TECHNIQUE: Multidetector CT imaging of the head and cervical spine was performed following the standard protocol without intravenous contrast. Multiplanar CT image reconstructions of the cervical spine were also generated. COMPARISON:  08/23/2004 cervical spine radiographs FINDINGS: CT HEAD FINDINGS Brain: No evidence of acute infarction, hemorrhage, hydrocephalus, extra-axial collection or mass lesion/mass effect. Atrophy, probable mild chronic small-vessel white matter ischemic changes and bilateral globus pallidus calcifications noted. Vascular: Carotid atherosclerotic calcifications are noted. Skull: Normal. Negative for fracture or focal lesion. Sinuses/Orbits: No acute abnormality. Mucosal thickening within the RIGHT maxillary sinus noted. Other: None CT CERVICAL SPINE FINDINGS Alignment: Normal. Skull base and vertebrae: No acute fracture. No primary bone lesion or focal pathologic process. Soft tissues and spinal canal: No prevertebral fluid or swelling. No visible canal hematoma. Disc levels: Moderate multilevel degenerative disc  disease, spondylosis and facet arthropathy noted contributing to mild central spinal and mild to moderate bony foraminal narrowing at multiple levels. Upper chest: No acute abnormality Other:  None IMPRESSION: 1. No evidence of acute intracranial abnormality. Atrophy and probable mild chronic small-vessel white matter ischemic changes. 2. No static evidence of acute injury to the cervical spine. Moderate multilevel degenerative changes. Electronically Signed   By: Harmon Pier M.D.   On: 12/31/2020 17:18   CT Knee Right Wo Contrast  Result Date: 12/31/2020 CLINICAL DATA:  Limping, acute, right knee pain EXAM: CT OF THE radiograph 12/31/2020 KNEE WITHOUT CONTRAST TECHNIQUE: Multidetector CT imaging of the RIGHT knee was performed according to the standard protocol. Multiplanar CT image reconstructions were also generated. COMPARISON:  Radiograph 12/31/2020 FINDINGS: Bones/Joint/Cartilage Moderate to severe tricompartmental degenerative changes including essentially bone-on-bone contact in the significantly narrowed medial femorotibial and patellofemoral compartments with exuberant periarticular spurring. More moderate change seen in the partly narrowed lateral femorotibial compartment as well. Corticated joint body versus fabella posteriorly. Subcortical sclerosis involving the medial tibial plateau concerning for an underlying osteochondral lesion. Conspicuous linear lucency concerning for nondisplaced fracture seen extending through the posteromedial corner on sagittal imaging and into the exuberant posterior osteophytes (10/75). Large knee joint effusion with evidence of synovitis. Partially decompressing into a Baker's cyst posteriorly demonstrating a layering fluid-fluid level. Ligaments Suboptimally assessed by CT. Muscles and Tendons No clear musculotendinous injury.  No retracted or torn tendons. Soft tissues Soft tissue swelling of the knee is most pronounced anteriorly. Joint effusion and bearing Baker's  cyst, as above. Extensive atherosclerotic calcification. Vasculature incompletely assessed. IMPRESSION: Diffuse bony demineralization, may limit detection of subtle injury. Features concerning for a nondisplaced fracture lucency through the posteromedial corner of the tibial plateau. Additional subcortical sclerotic changes in a configuration suspicious for osteochondral lesion/spontaneous osteonecrosis of the medial tibial plateau weight-bearing articular surface as well. Large effusion partially decompressing into a Baker's cyst with evidence of synovitis and layering fluid fluid levels which can reflect hemarthrosis in the setting of fracture. Moderate to severe tricompartmental degenerative changes most pronounced in the medial femorotibial and patellofemoral compartments where there is essentially complete effacement of the joint space. Atherosclerosis. Electronically Signed   By: Kreg Shropshire M.D.   On: 12/31/2020 19:21   DG Knee Complete 4 Views Right  Result Date: 12/31/2020 CLINICAL DATA:  87 year old female with acute RIGHT knee pain following fall today. Initial encounter. EXAM: RIGHT KNEE - COMPLETE 4+ VIEW COMPARISON:  12/08/2016 FINDINGS: No acute fracture or dislocation. Severe tricompartmental joint space narrowing and osteophytosis noted. No joint effusion is present. IMPRESSION: 1. No acute abnormality. 2. Severe tricompartmental degenerative changes. Electronically Signed   By: Harmon Pier M.D.   On: 12/31/2020 15:06   DG HIP UNILAT WITH PELVIS 2-3 VIEWS LEFT  Result Date: 12/31/2020 CLINICAL DATA:  Larey Seat yesterday.  Pain. EXAM: DG HIP (WITH OR WITHOUT PELVIS) 2-3V LEFT COMPARISON:  None. FINDINGS: There is no acute fracture or subluxation. Femoral artery calcification and vascular stents noted. Moderate stool burden in the rectosigmoid colon. There is sclerosis of the symphysis pubis. IMPRESSION: No evidence for acute  abnormality. Electronically Signed   By: Norva Pavlov M.D.   On:  12/31/2020 17:00   DG HIP UNILAT WITH PELVIS 2-3 VIEWS RIGHT  Result Date: 12/31/2020 CLINICAL DATA:  Larey Seat yesterday.  RIGHT hip pain. EXAM: DG HIP (WITH OR WITHOUT PELVIS) 2-3V RIGHT COMPARISON:  Pelvis 12/31/2020 FINDINGS: There is no acute fracture or subluxation. Note is made of dense atherosclerotic calcification of the femoral artery. There is sclerosis of the symphysis pubis. IMPRESSION: No evidence for acute  abnormality. Electronically Signed   By: Lanora Manis  Manson Passey M.D.   On: 12/31/2020 17:01    Review of Systems  Constitutional: Negative.   HENT:  Positive for hearing loss.   Eyes: Negative.   Respiratory: Negative.    Cardiovascular:  Positive for chest pain (when takes deep breaths).  Gastrointestinal: Negative.   Endocrine: Negative.   Genitourinary: Negative.   Musculoskeletal:  Positive for arthralgias and joint swelling.  Skin: Negative.   Allergic/Immunologic: Negative.   Neurological: Negative.   Hematological: Negative.   Psychiatric/Behavioral: Negative.      Blood pressure 130/76, pulse 67, temperature 98.3 F (36.8 C), resp. rate 18, height 5\' 1"  (1.549 m), weight 85.3 kg, SpO2 96 %. Physical Exam Vitals reviewed.  Constitutional:      General: She is not in acute distress.    Appearance: Normal appearance. She is normal weight. She is not ill-appearing, toxic-appearing or diaphoretic.  HENT:     Head: Normocephalic and atraumatic.     Right Ear: External ear normal.     Left Ear: External ear normal.     Nose: No congestion or rhinorrhea.     Mouth/Throat:     Comments: Kn 95 mask on Eyes:     General: No scleral icterus.       Right eye: No discharge.        Left eye: No discharge.     Extraocular Movements: Extraocular movements intact.     Conjunctiva/sclera: Conjunctivae normal.     Pupils: Pupils are equal, round, and reactive to light.  Cardiovascular:     Rate and Rhythm: Normal rate and regular rhythm.     Pulses: Normal pulses.     Heart  sounds: Normal heart sounds. No murmur heard.   No gallop.     Comments: +2 pitting edema BLE Pulmonary:     Effort: Pulmonary effort is normal. No respiratory distress.     Breath sounds: Normal breath sounds. No wheezing, rhonchi or rales.  Abdominal:     General: Abdomen is flat. Bowel sounds are normal. There is no distension.     Palpations: Abdomen is soft. There is no mass.     Tenderness: There is no abdominal tenderness. There is no guarding or rebound.  Musculoskeletal:        General: Swelling (right knee and right wrist), tenderness and signs of injury present.     Right lower leg: Edema present.     Left lower leg: Edema present.  Skin:    General: Skin is warm and dry.     Capillary Refill: Capillary refill takes 2 to 3 seconds.     Coloration: Skin is not jaundiced or pale.     Findings: No bruising, erythema, lesion or rash.     Comments: Venous stasis changes BLE  Neurological:     General: No focal deficit present.     Mental Status: She is alert and oriented to person, place, and time.     Cranial Nerves: Cranial nerve deficit (CN VIII) present.     Sensory: No sensory deficit.     Motor: No weakness.     Coordination: Coordination normal.  Psychiatric:        Mood and Affect: Mood normal.        Behavior: Behavior normal.        Thought Content: Thought content normal.        Judgment: Judgment normal.     Assessment/Plan Fall Two left rib fractures (7-8) Possible right wrist fracture Possible right tibial plateau  fracture. H/o NSTEMI H/o PE Chronic diastolic heart failure HTN BLE edema  Admit for pain control Pulmonary toilet Ortho recommended knee immobilizer for possible fracture given symptoms, mechanism, and imaging. Dr. Ave Filter to see in AM. In wrist splint as well for possible triquetrum fx. Will need hand consult.  On eliquis- hold PO blood thinner. If surgery deemed necessary, can do lovenox while awaiting surgery.  If not, can resume  eliquis     Almond Lint 12/31/2020, 9:47 PM   Procedures

## 2020-12-31 NOTE — ED Triage Notes (Addendum)
Patient arrived by PTAR from home after complaining of right wrist, right knee and left rib pain following fall yesterday. No loc. Alert and oriented

## 2020-12-31 NOTE — Progress Notes (Signed)
Orthopedic Tech Progress Note Patient Details:  Lynn Brown [birth year] [id number]   Ortho Devices Type of Ortho Device: Sugartong splint Ortho Device/Splint Location: RUE Ortho Device/Splint Interventions: Ordered, Application, Adjustment   Post Interventions Patient Tolerated: Well Instructions Provided: Care of device  Jiovany Scheffel Carmine Savoy 12/31/2020, 7:15 PM

## 2020-12-31 NOTE — ED Provider Notes (Signed)
Emergency Medicine Provider Triage Evaluation Note  Lynn Brown , a 87 y.o. female  was evaluated in triage.  Pt complains of  a fall that occurred yesterday. Missed a step going down the stairs and fell forward. Denies head injury. Complains of right wrist pain, right knee pain, and left rib pain w/ inspiration. Reports taking anti coagulation though cannot remember which. She is on eliquis. No CP, dizziness, sob surrounding fall. No headache, blurry vision, nausea vomiting    Review of Systems  Positive: As above  Negative: As above   Physical Exam  There were no vitals taken for this visit. Gen:   Awake, no distress   Resp:  Normal effort  MSK:   Moves extremities without difficulty, visible erythema and swelling to the right wrist, with effusion to the right knee.  2+ radial pulses.  Alert and oriented x3, no midline tenderness of cervical spine, moving all 4 extremities without difficulty Other:    Medical Decision Making  Medically screening exam initiated at 1:45 PM.  Appropriate orders placed.  Lynn Brown was informed that the remainder of the evaluation will be completed by another provider, this initial triage assessment does not replace that evaluation, and the importance of remaining in the ED until their evaluation is complete.     Lynn Ferrari, PA-C 12/31/20 1350    Lynn Avena, MD 12/31/20 2149

## 2020-12-31 NOTE — ED Notes (Addendum)
Pt reports she was standing on the stairs and heading down the stairs when slipped on the last step and fell down the last 2 steps per pt. Pt c/o R wrist, L rib and R leg pain. Pt reports she is ambulatory w/ cane at baseline. Pt reports she has a slow gait at baseline and "sometimes" uses her cane. Pt was assisted into bed from wheelchair w/ 4 person assist.

## 2021-01-01 LAB — BASIC METABOLIC PANEL
Anion gap: 3 — ABNORMAL LOW (ref 5–15)
Anion gap: 9 (ref 5–15)
BUN: 19 mg/dL (ref 8–23)
BUN: 19 mg/dL (ref 8–23)
CO2: 25 mmol/L (ref 22–32)
CO2: 21 mmol/L — ABNORMAL LOW (ref 22–32)
Calcium: 7.9 mg/dL — ABNORMAL LOW (ref 8.9–10.3)
Calcium: 8.9 mg/dL (ref 8.9–10.3)
Chloride: 105 mmol/L (ref 98–111)
Chloride: 106 mmol/L (ref 98–111)
Creatinine, Ser: 0.99 mg/dL (ref 0.44–1.00)
Creatinine, Ser: 0.9 mg/dL (ref 0.44–1.00)
GFR, Estimated: 55 mL/min — ABNORMAL LOW (ref >60)
GFR, Estimated: >60 mL/min (ref >60)
Glucose, Bld: 476 mg/dL — ABNORMAL HIGH (ref 70–99)
Glucose, Bld: 123 mg/dL — ABNORMAL HIGH (ref 70–99)
Potassium: 6.3 mmol/L (ref 3.5–5.1)
Potassium: 4.4 mmol/L (ref 3.5–5.1)
Sodium: 133 mmol/L — ABNORMAL LOW (ref 135–145)
Sodium: 136 mmol/L (ref 135–145)

## 2021-01-01 LAB — CBC
HCT: 36.2 % (ref 36.0–46.0)
Hemoglobin: 11.8 g/dL — ABNORMAL LOW (ref 12.0–15.0)
MCH: 31.4 pg (ref 26.0–34.0)
MCHC: 32.6 g/dL (ref 30.0–36.0)
MCV: 96.3 fL (ref 80.0–100.0)
Platelets: 156 10*3/uL (ref 150–400)
RBC: 3.76 MIL/uL — ABNORMAL LOW (ref 3.87–5.11)
RDW: 14.1 % (ref 11.5–15.5)
WBC: 4.9 10*3/uL (ref 4.0–10.5)
nRBC: 0 % (ref 0.0–0.2)

## 2021-01-01 LAB — CREATININE, SERUM
Creatinine, Ser: 1.03 mg/dL — ABNORMAL HIGH (ref 0.44–1.00)
GFR, Estimated: 53 mL/min — ABNORMAL LOW (ref >60)

## 2021-01-01 LAB — GLUCOSE, CAPILLARY: Glucose-Capillary: 107 mg/dL — ABNORMAL HIGH (ref 70–99)

## 2021-01-01 MED ORDER — FUROSEMIDE 20 MG PO TABS
20.0000 mg | ORAL_TABLET | Freq: Every morning | ORAL | Status: DC
  Administered 2021-01-01 – 2021-01-05 (×5): 20 mg via ORAL
  Filled 2021-01-01 (×5): qty 1

## 2021-01-01 MED ORDER — ONDANSETRON HCL 4 MG/2ML IJ SOLN: 4.0000 mg | Freq: Four times a day (QID) | INTRAMUSCULAR | Status: DC | PRN

## 2021-01-01 MED ORDER — METOPROLOL TARTRATE 12.5 MG HALF TABLET
12.5000 mg | ORAL_TABLET | Freq: Two times a day (BID) | ORAL | Status: DC
  Administered 2021-01-01 – 2021-01-05 (×9): 12.5 mg via ORAL
  Filled 2021-01-01 (×10): qty 1

## 2021-01-01 MED ORDER — FENTANYL CITRATE (PF) 100 MCG/2ML IJ SOLN
25.0000 ug | INTRAMUSCULAR | Status: DC | PRN
  Administered 2021-01-01 (×3): 25 ug via INTRAVENOUS
  Filled 2021-01-01 (×3): qty 2

## 2021-01-01 MED ORDER — PROCHLORPERAZINE EDISYLATE 10 MG/2ML IJ SOLN: 5.0000 mg | Freq: Four times a day (QID) | INTRAMUSCULAR | Status: DC | PRN

## 2021-01-01 MED ORDER — KCL IN DEXTROSE-NACL 20-5-0.45 MEQ/L-%-% IV SOLN
INTRAVENOUS | Status: DC
  Filled 2021-01-01 (×3): qty 1000

## 2021-01-01 MED ORDER — CALCIUM CARBONATE-VITAMIN D 500-200 MG-UNIT PO TABS
1.0000 | ORAL_TABLET | Freq: Every day | ORAL | Status: DC
  Administered 2021-01-01 – 2021-01-05 (×5): 1 via ORAL
  Filled 2021-01-01 (×5): qty 1

## 2021-01-01 MED ORDER — LORATADINE 10 MG PO TABS
10.0000 mg | ORAL_TABLET | Freq: Every day | ORAL | Status: DC
  Administered 2021-01-01 – 2021-01-05 (×5): 10 mg via ORAL
  Filled 2021-01-01 (×5): qty 1

## 2021-01-01 MED ORDER — TRAMADOL HCL 50 MG PO TABS
50.0000 mg | ORAL_TABLET | Freq: Four times a day (QID) | ORAL | Status: DC | PRN
Start: 2021-01-01 — End: 2021-01-02
  Administered 2021-01-01 – 2021-01-02 (×3): 50 mg via ORAL
  Filled 2021-01-01 (×3): qty 1

## 2021-01-01 MED ORDER — LEVOTHYROXINE SODIUM 112 MCG PO TABS
112.0000 ug | ORAL_TABLET | Freq: Every morning | ORAL | Status: DC
  Administered 2021-01-01 – 2021-01-05 (×5): 112 ug via ORAL
  Filled 2021-01-01 (×5): qty 1

## 2021-01-01 MED ORDER — NITROGLYCERIN 0.4 MG SL SUBL: 0.4000 mg | SUBLINGUAL_TABLET | SUBLINGUAL | Status: DC | PRN

## 2021-01-01 MED ORDER — ROSUVASTATIN CALCIUM 20 MG PO TABS
20.0000 mg | ORAL_TABLET | Freq: Every evening | ORAL | Status: DC
  Administered 2021-01-01 – 2021-01-04 (×4): 20 mg via ORAL
  Filled 2021-01-01 (×4): qty 1

## 2021-01-01 MED ORDER — DOCUSATE SODIUM 100 MG PO CAPS
100.0000 mg | ORAL_CAPSULE | Freq: Two times a day (BID) | ORAL | Status: DC
  Administered 2021-01-01 – 2021-01-05 (×10): 100 mg via ORAL
  Filled 2021-01-01 (×10): qty 1

## 2021-01-01 MED ORDER — OXYCODONE HCL 5 MG PO TABS
5.0000 mg | ORAL_TABLET | Freq: Four times a day (QID) | ORAL | Status: DC | PRN
Start: 2021-01-01 — End: 2021-01-02
  Administered 2021-01-01 – 2021-01-02 (×4): 5 mg via ORAL
  Filled 2021-01-01 (×4): qty 1

## 2021-01-01 MED ORDER — ENOXAPARIN SODIUM 30 MG/0.3ML IJ SOSY
30.0000 mg | PREFILLED_SYRINGE | Freq: Two times a day (BID) | INTRAMUSCULAR | Status: DC
  Administered 2021-01-02: 30 mg via SUBCUTANEOUS
  Filled 2021-01-01: qty 0.3

## 2021-01-01 MED ORDER — CLORAZEPATE DIPOTASSIUM 3.75 MG PO TABS: 7.5000 mg | ORAL_TABLET | Freq: Every day | ORAL | Status: DC | PRN

## 2021-01-01 MED ORDER — MAGNESIUM OXIDE -MG SUPPLEMENT 400 (240 MG) MG PO TABS
400.0000 mg | ORAL_TABLET | Freq: Every evening | ORAL | Status: DC
  Administered 2021-01-01 – 2021-01-04 (×4): 400 mg via ORAL
  Filled 2021-01-01 (×4): qty 1

## 2021-01-01 MED ORDER — PROCHLORPERAZINE MALEATE 10 MG PO TABS
10.0000 mg | ORAL_TABLET | Freq: Four times a day (QID) | ORAL | Status: DC | PRN
  Filled 2021-01-01: qty 1

## 2021-01-01 MED ORDER — ACETAMINOPHEN 325 MG PO TABS: 650.0000 mg | ORAL_TABLET | ORAL | Status: DC | PRN

## 2021-01-01 MED ORDER — ONDANSETRON 4 MG PO TBDP: 4.0000 mg | ORAL_TABLET | Freq: Four times a day (QID) | ORAL | Status: DC | PRN

## 2021-01-01 NOTE — Consult Note (Signed)
Reason for Consult: Right knee pain after fall a home Referring Physician: Trauma  Lynn Brown is an 87 y.o. female.  HPI: Patient reports that on Friday 12/30/20 she was talking to her son at home when she fell down stairs. She reports that prior to the fall, she does have a history of right knee pain due to osteoarthritis but was able to go for daily walks. She states that now she has significant pain in the right knee which prohibits her from putting weight on the knee. Patient denies previous surgery on the right knee.  Past Medical History:  Diagnosis Date   Allergic rhinitis    Anxiety    Bunion    PES PLANUS   Hearing loss    HEARING AIDS, BUT DOES NOT WEAR THEM CONSISTENTLY.    Hypercholesteremia    Hypertension    Hypothyroid    Leaky heart valve    DR. HARWANI   Mild obesity    Osteoarthritis of knees, bilateral    Osteoporosis 12/2017   T score -3.3   Venous insufficiency     Past Surgical History:  Procedure Laterality Date   ABDOMINAL AORTOGRAM W/LOWER EXTREMITY Left 05/04/2020   Procedure: ABDOMINAL AORTOGRAM W/LOWER EXTREMITY;  Surgeon: Leonie Douglas, MD;  Location: [facility];  Service: Cardiovascular;  Laterality: Left;   CATARACT EXTRACTION, BILATERAL  2013   DR. SHAPIRO    HYSTEROSCOPY     D & C   LEFT HEART CATH AND CORONARY ANGIOGRAPHY N/A 03/10/2018   Procedure: LEFT HEART CATH AND CORONARY ANGIOGRAPHY;  Surgeon: Lyn Records, MD;  Location: [facility];  Service: Cardiovascular;  Laterality: N/A;   NEPHRECTOMY LIVING DONOR  1978   PERIPHERAL VASCULAR INTERVENTION Left 05/04/2020   Procedure: PERIPHERAL VASCULAR INTERVENTION;  Surgeon: Leonie Douglas, MD;  Location: [facility];  Service: Cardiovascular;  Laterality: Left;  Femoral popliteal    Family History  Problem Relation Age of Onset   Hypertension Mother    CVA Mother    Diabetes Sister    Hypertension Sister    Heart disease Sister    Multiple sclerosis Sister     Hypertension Father    CVA Father    Cancer Brother        prostrate   Hypertension Brother    CVA Brother    Seizures Brother    Pancreatic cancer Sister    Kidney disease Sister    Kidney failure Sister    Other Brother        PNEUMONIA AS A BABY   Hypertension Brother    Lymphoma Daughter    Deep vein thrombosis Sister    Pulmonary embolism Niece    Breast cancer Neg Hx     Social History:  reports that she has never smoked. She has never used smokeless tobacco. She reports that she does not drink alcohol and does not use drugs.  Allergies: No Known Allergies  Medications: I have reviewed the patient's current medications.  Results for orders placed or performed during the hospital encounter of 12/31/20 (from the past 48 hour(s))  CBC     Status: None   Collection Time: 12/31/20  1:59 PM  Result Value Ref Range   WBC 5.8 4.0 - 10.5 K/uL   RBC 4.26 3.87 - 5.11 MIL/uL   Hemoglobin 13.0 12.0 - 15.0 g/dL   HCT 16.1 09.6 - 04.5 %   MCV 96.5 80.0 - 100.0 fL  MCH 30.5 26.0 - 34.0 pg   MCHC 31.6 30.0 - 36.0 g/dL   RDW 89.3 81.0 - 17.5 %   Platelets 173 150 - 400 K/uL   nRBC 0.0 0.0 - 0.2 %    Comment: Performed at [facility], [address], [place], [place] [address]  Basic metabolic panel     Status: Abnormal   Collection Time: 12/31/20  1:59 PM  Result Value Ref Range   Sodium 139 135 - 145 mmol/L   Potassium 4.0 3.5 - 5.1 mmol/L   Chloride 106 98 - 111 mmol/L   CO2 26 22 - 32 mmol/L   Glucose, Bld 96 70 - 99 mg/dL    Comment: Glucose reference range applies only to samples taken after fasting for at least 8 hours.   BUN 24 (H) 8 - 23 mg/dL   Creatinine, Ser 5.27 (H) 0.44 - 1.00 mg/dL   Calcium 9.5 8.9 - 78.2 mg/dL   GFR, Estimated 52 (L) >60 mL/min    Comment: (NOTE) Calculated using the CKD-EPI Creatinine Equation (2021)    Anion gap 7 5 - 15    Comment: Performed at [facility], [address], [place], [place] [address]  Resp Panel by  RT-PCR (Flu A&B, Covid) Nasopharyngeal Swab     Status: None   Collection Time: 12/31/20  8:01 PM   Specimen: Nasopharyngeal Swab; Nasopharyngeal(NP) swabs in vial transport medium  Result Value Ref Range   SARS Coronavirus 2 by RT PCR NEGATIVE NEGATIVE    Comment: (NOTE) SARS-CoV-2 target nucleic acids are NOT DETECTED.  The SARS-CoV-2 RNA is generally detectable in upper respiratory specimens during the acute phase of infection. The lowest concentration of SARS-CoV-2 viral copies this assay can detect is 138 copies/mL. A negative result does not preclude SARS-Cov-2 infection and should not be used as the sole basis for treatment or other patient management decisions. A negative result may occur with  improper specimen collection/handling, submission of specimen other than nasopharyngeal swab, presence of viral mutation(s) within the areas targeted by this assay, and inadequate number of viral copies(<138 copies/mL). A negative result must be combined with clinical observations, patient history, and epidemiological information. The expected result is Negative.  Fact Sheet for Patients:  BloggerCourse.com  Fact Sheet for Healthcare Providers:  SeriousBroker.it  This test is no t yet approved or cleared by the [place] FDA and  has been authorized for detection and/or diagnosis of SARS-CoV-2 by FDA under an Emergency Use Authorization (EUA). This EUA will remain  in effect (meaning this test can be used) for the duration of the COVID-19 declaration under Section 564(b)(1) of the Act, 21 U.S.C.section 360bbb-3(b)(1), unless the authorization is terminated  or revoked sooner.       Influenza A by PCR NEGATIVE NEGATIVE   Influenza B by PCR NEGATIVE NEGATIVE    Comment: (NOTE) The Xpert Xpress SARS-CoV-2/FLU/RSV plus assay is intended as an aid in the diagnosis of influenza from Nasopharyngeal swab specimens and should not be  used as a sole basis for treatment. Nasal washings and aspirates are unacceptable for Xpert Xpress SARS-CoV-2/FLU/RSV testing.  Fact Sheet for Patients: BloggerCourse.com  Fact Sheet for Healthcare Providers: SeriousBroker.it  This test is not yet approved or cleared by the [place] FDA and has been authorized for detection and/or diagnosis of SARS-CoV-2 by FDA under an Emergency Use Authorization (EUA). This EUA will remain in effect (meaning this test can be used) for the duration of the  COVID-19 declaration under Section 564(b)(1) of the Act, 21 U.S.C. section 360bbb-3(b)(1), unless the authorization is terminated or revoked.  Performed at [facility], [address] [place], [place] [address]   Creatinine, serum     Status: Abnormal   Collection Time: 01/01/21  5:49 AM  Result Value Ref Range   Creatinine, Ser 1.03 (H) 0.44 - 1.00 mg/dL   GFR, Estimated 53 (L) >60 mL/min    Comment: (NOTE) Calculated using the CKD-EPI Creatinine Equation (2021) Performed at [facility], [address] [place], [place] [address]   CBC     Status: Abnormal   Collection Time: 01/01/21  5:49 AM  Result Value Ref Range   WBC 4.9 4.0 - 10.5 K/uL   RBC 3.76 (L) 3.87 - 5.11 MIL/uL   Hemoglobin 11.8 (L) 12.0 - 15.0 g/dL   HCT 76.8 11.5 - 72.6 %   MCV 96.3 80.0 - 100.0 fL   MCH 31.4 26.0 - 34.0 pg   MCHC 32.6 30.0 - 36.0 g/dL   RDW 20.3 55.9 - 74.1 %   Platelets 156 150 - 400 K/uL   nRBC 0.0 0.0 - 0.2 %    Comment: Performed at [facility], [address], [place], [place] [address]    DG Ribs Unilateral Right  Result Date: 12/31/2020 CLINICAL DATA:  Acute RIGHT chest and rib pain following fall. Initial encounter. EXAM: RIGHT RIBS - 2 VIEW COMPARISON:  08/09/2020 radiographs FINDINGS: No fracture or other bone lesions are seen involving the ribs. IMPRESSION: Negative. Electronically Signed   By: Harmon Pier M.D.    On: 12/31/2020 15:12   DG Ribs Unilateral W/Chest Left  Result Date: 12/31/2020 CLINICAL DATA:  Fall, wrist and knee fracture.  Left chest pain. EXAM: LEFT RIBS AND CHEST - 3+ VIEW COMPARISON:  08/09/2020 FINDINGS: There are acute minimally displaced fractures of the left sixth and seventh ribs laterally. Remote healed fractures of the third and fourth ribs noted. Lung volumes are small and there is bibasilar atelectasis. No pneumothorax or pleural effusion. Cardiac size within normal limits given poor pulmonary insufflation. Pulmonary vascularity is normal. IMPRESSION: Acute left sixth and seventh rib fractures.  No pneumothorax. Electronically Signed   By: Helyn Numbers MD   On: 12/31/2020 15:08   DG Wrist Complete Right  Result Date: 12/31/2020 CLINICAL DATA:  Fall.  Pain in the RIGHT wrist. EXAM: RIGHT WRIST - COMPLETE 3+ VIEW COMPARISON:  None. FINDINGS: There is lucency traversing the triquetrum and mild irregularity along the dorsum of the wrist on the LATERAL view. Findings raise a question of triquetrum fracture. Alignment of the carpals is otherwise normal. No radiopaque foreign body. IMPRESSION: Possible triquetrum fracture. Electronically Signed   By: Norva Pavlov M.D.   On: 12/31/2020 15:09   DG Tibia/Fibula Right  Result Date: 12/31/2020 CLINICAL DATA:  Larey Seat yesterday. RIGHT hip pain. RIGHT LOWER leg pain. EXAM: RIGHT TIBIA AND FIBULA - 2 VIEW COMPARISON:  None. FINDINGS: There are marked degenerative changes involving MEDIAL, LATERAL, in patellofemoral compartment of the knee. There is no acute fracture or subluxation. Note is made of pes planus. Soft tissues are unremarkable. IMPRESSION: Significant degenerative changes at the knee. No evidence for acute abnormality. Electronically Signed   By: Norva Pavlov M.D.   On: 12/31/2020 16:59   CT HEAD WO CONTRAST ( )  Result Date: 12/31/2020 CLINICAL DATA:  87 year old female with head and neck injury from fall today. Initial  encounter. EXAM: CT HEAD  WITHOUT CONTRAST CT CERVICAL SPINE WITHOUT CONTRAST TECHNIQUE: Multidetector CT imaging of the head and cervical spine was performed following the standard protocol without intravenous contrast. Multiplanar CT image reconstructions of the cervical spine were also generated. COMPARISON:  08/23/2004 cervical spine radiographs FINDINGS: CT HEAD FINDINGS Brain: No evidence of acute infarction, hemorrhage, hydrocephalus, extra-axial collection or mass lesion/mass effect. Atrophy, probable mild chronic small-vessel white matter ischemic changes and bilateral globus pallidus calcifications noted. Vascular: Carotid atherosclerotic calcifications are noted. Skull: Normal. Negative for fracture or focal lesion. Sinuses/Orbits: No acute abnormality. Mucosal thickening within the RIGHT maxillary sinus noted. Other: None CT CERVICAL SPINE FINDINGS Alignment: Normal. Skull base and vertebrae: No acute fracture. No primary bone lesion or focal pathologic process. Soft tissues and spinal canal: No prevertebral fluid or swelling. No visible canal hematoma. Disc levels: Moderate multilevel degenerative disc disease, spondylosis and facet arthropathy noted contributing to mild central spinal and mild to moderate bony foraminal narrowing at multiple levels. Upper chest: No acute abnormality Other: None IMPRESSION: 1. No evidence of acute intracranial abnormality. Atrophy and probable mild chronic small-vessel white matter ischemic changes. 2. No static evidence of acute injury to the cervical spine. Moderate multilevel degenerative changes. Electronically Signed   By: Harmon Pier M.D.   On: 12/31/2020 17:18   CT Cervical Spine Wo Contrast  Result Date: 12/31/2020 CLINICAL DATA:  87 year old female with head and neck injury from fall today. Initial encounter. EXAM: CT HEAD WITHOUT CONTRAST CT CERVICAL SPINE WITHOUT CONTRAST TECHNIQUE: Multidetector CT imaging of the head and cervical spine was performed  following the standard protocol without intravenous contrast. Multiplanar CT image reconstructions of the cervical spine were also generated. COMPARISON:  08/23/2004 cervical spine radiographs FINDINGS: CT HEAD FINDINGS Brain: No evidence of acute infarction, hemorrhage, hydrocephalus, extra-axial collection or mass lesion/mass effect. Atrophy, probable mild chronic small-vessel white matter ischemic changes and bilateral globus pallidus calcifications noted. Vascular: Carotid atherosclerotic calcifications are noted. Skull: Normal. Negative for fracture or focal lesion. Sinuses/Orbits: No acute abnormality. Mucosal thickening within the RIGHT maxillary sinus noted. Other: None CT CERVICAL SPINE FINDINGS Alignment: Normal. Skull base and vertebrae: No acute fracture. No primary bone lesion or focal pathologic process. Soft tissues and spinal canal: No prevertebral fluid or swelling. No visible canal hematoma. Disc levels: Moderate multilevel degenerative disc disease, spondylosis and facet arthropathy noted contributing to mild central spinal and mild to moderate bony foraminal narrowing at multiple levels. Upper chest: No acute abnormality Other: None IMPRESSION: 1. No evidence of acute intracranial abnormality. Atrophy and probable mild chronic small-vessel white matter ischemic changes. 2. No static evidence of acute injury to the cervical spine. Moderate multilevel degenerative changes. Electronically Signed   By: Harmon Pier M.D.   On: 12/31/2020 17:18   CT Knee Right Wo Contrast  Result Date: 12/31/2020 CLINICAL DATA:  Limping, acute, right knee pain EXAM: CT OF THE radiograph 12/31/2020 KNEE WITHOUT CONTRAST TECHNIQUE: Multidetector CT imaging of the RIGHT knee was performed according to the standard protocol. Multiplanar CT image reconstructions were also generated. COMPARISON:  Radiograph 12/31/2020 FINDINGS: Bones/Joint/Cartilage Moderate to severe tricompartmental degenerative changes including  essentially bone-on-bone contact in the significantly narrowed medial femorotibial and patellofemoral compartments with exuberant periarticular spurring. More moderate change seen in the partly narrowed lateral femorotibial compartment as well. Corticated joint body versus fabella posteriorly. Subcortical sclerosis involving the medial tibial plateau concerning for an underlying osteochondral lesion. Conspicuous linear lucency concerning for nondisplaced fracture seen extending through the posteromedial corner on sagittal imaging and into  the exuberant posterior osteophytes (10/75). Large knee joint effusion with evidence of synovitis. Partially decompressing into a Baker's cyst posteriorly demonstrating a layering fluid-fluid level. Ligaments Suboptimally assessed by CT. Muscles and Tendons No clear musculotendinous injury.  No retracted or torn tendons. Soft tissues Soft tissue swelling of the knee is most pronounced anteriorly. Joint effusion and bearing Baker's cyst, as above. Extensive atherosclerotic calcification. Vasculature incompletely assessed. IMPRESSION: Diffuse bony demineralization, may limit detection of subtle injury. Features concerning for a nondisplaced fracture lucency through the posteromedial corner of the tibial plateau. Additional subcortical sclerotic changes in a configuration suspicious for osteochondral lesion/spontaneous osteonecrosis of the medial tibial plateau weight-bearing articular surface as well. Large effusion partially decompressing into a Baker's cyst with evidence of synovitis and layering fluid fluid levels which can reflect hemarthrosis in the setting of fracture. Moderate to severe tricompartmental degenerative changes most pronounced in the medial femorotibial and patellofemoral compartments where there is essentially complete effacement of the joint space. Atherosclerosis. Electronically Signed   By: Kreg Shropshire M.D.   On: 12/31/2020 19:21   DG Knee Complete 4 Views  Right  Result Date: 12/31/2020 CLINICAL DATA:  87 year old female with acute RIGHT knee pain following fall today. Initial encounter. EXAM: RIGHT KNEE - COMPLETE 4+ VIEW COMPARISON:  12/08/2016 FINDINGS: No acute fracture or dislocation. Severe tricompartmental joint space narrowing and osteophytosis noted. No joint effusion is present. IMPRESSION: 1. No acute abnormality. 2. Severe tricompartmental degenerative changes. Electronically Signed   By: Harmon Pier M.D.   On: 12/31/2020 15:06   DG HIP UNILAT WITH PELVIS 2-3 VIEWS LEFT  Result Date: 12/31/2020 CLINICAL DATA:  Larey Seat yesterday.  Pain. EXAM: DG HIP (WITH OR WITHOUT PELVIS) 2-3V LEFT COMPARISON:  None. FINDINGS: There is no acute fracture or subluxation. Femoral artery calcification and vascular stents noted. Moderate stool burden in the rectosigmoid colon. There is sclerosis of the symphysis pubis. IMPRESSION: No evidence for acute  abnormality. Electronically Signed   By: Norva Pavlov M.D.   On: 12/31/2020 17:00   DG HIP UNILAT WITH PELVIS 2-3 VIEWS RIGHT  Result Date: 12/31/2020 CLINICAL DATA:  Larey Seat yesterday.  RIGHT hip pain. EXAM: DG HIP (WITH OR WITHOUT PELVIS) 2-3V RIGHT COMPARISON:  Pelvis 12/31/2020 FINDINGS: There is no acute fracture or subluxation. Note is made of dense atherosclerotic calcification of the femoral artery. There is sclerosis of the symphysis pubis. IMPRESSION: No evidence for acute  abnormality. Electronically Signed   By: Norva Pavlov M.D.   On: 12/31/2020 17:01    Review of Systems  Constitutional:  Positive for fatigue. Negative for chills, diaphoresis and fever.  HENT:  Negative for congestion, facial swelling, sneezing and trouble swallowing.   Respiratory:  Positive for shortness of breath. Negative for apnea.        Secondary to pain associated with rib fractures  Cardiovascular:  Positive for chest pain. Negative for palpitations.       Secondary to pain associated with rib fractures   Gastrointestinal:  Negative for abdominal pain, nausea and vomiting.  Genitourinary:  Negative for pelvic pain.  Musculoskeletal:  Positive for arthralgias, gait problem, joint swelling and myalgias.  Skin:  Negative for color change and wound.  Neurological:  Negative for speech difficulty, light-headedness and headaches.  Psychiatric/Behavioral:  Negative for behavioral problems and confusion. The patient is not nervous/anxious.   Blood pressure 129/76, pulse (!) 55, temperature 97.8 F (36.6 C), temperature source Oral, resp. rate 20, height  (1.549 m), weight 85.3 kg, SpO2 98 %.  Physical Exam Constitutional:      General: She is not in acute distress.    Appearance: Normal appearance. She is not toxic-appearing.  HENT:     Head: Normocephalic and atraumatic.  Eyes:     Extraocular Movements: Extraocular movements intact.  Cardiovascular:     Rate and Rhythm: Normal rate and regular rhythm.     Pulses: Normal pulses.  Pulmonary:     Effort: Pulmonary effort is normal.  Musculoskeletal:     Cervical back: Normal range of motion.     Comments: Right knee shows moderate soft tissue swelling and slight effusion. No open wound or abrasion. Tenderness to palpation along the medial and lateral joint lines. ROM not attempted due to known fracture. Sensation to light touch intact in BLE. Able to dorsiflex and plantar flex the ankles. Distal pulses 2+. Compartment soft.   Skin:    Findings: No erythema.  Neurological:     Mental Status: She is alert and oriented to person, place, and time.  Psychiatric:        Mood and Affect: Mood normal.        Behavior: Behavior normal.        Thought Content: Thought content normal.        Judgment: Judgment normal.    Assessment/Plan: Nondisplaced fracture through the right posteromedial corner of the tibial plateau. No surgical intervention necessary.The fracture site is partially in the weight bearing surface of the tibial plateau so the  patient will remain nonweightbearing for the right LE. She could be touch down weightbearing but concern for patient's ability to control movement and weight distrubution given age related weakness and right wrist fracture which also NWB. Maintain knee immobilizer. Follow up in office in 2 weeks with Dr Ave Filterhandler.   Marcus Schwandt L. [place], PA-C 01/01/2021, 7:47 AM

## 2021-01-01 NOTE — Plan of Care (Signed)

## 2021-01-01 NOTE — Plan of Care (Signed)
  Problem: Pain Managment: Goal: General experience of comfort will improve Outcome: Progressing   Problem: Safety: Goal: Ability to remain free from injury will improve Outcome: Progressing   Problem: Skin Integrity: Goal: Risk for impaired skin integrity will decrease Outcome: Progressing   

## 2021-01-01 NOTE — Progress Notes (Signed)
Patient ID: Lynn Brown, female   DOB: [birth year], 87 y.o.   MRN: [id number]  Ok from ortho standpoint to lift bedrest order so she can work with PT/OT but defer to primary team if ok

## 2021-01-01 NOTE — Plan of Care (Signed)

## 2021-01-01 NOTE — Progress Notes (Signed)
PT Cancellation Note  Patient Details Name: Lynn Brown MRN: [id number] DOB: 08-Jan-1932   Cancelled Treatment:    Reason Eval/Treat Not Completed: Other (comment) still waiting on wrist splint/brace, RN asks [place] to hold until that has arrived. Will f/u and initiate eval as able.   Madelaine Etienne, DPT, PN2   Supplemental Physical Therapist [facility]    Pager [phone] Acute Rehab Office [phone]

## 2021-01-01 NOTE — Progress Notes (Signed)
Patient admited from the ER. Pt is is alert and oriented X 4. Vital signs are stable. Patient oriented to the floor. Patient denies pain. Patient is resting at this time. Call bell is within reach. Will monitor.

## 2021-01-01 NOTE — Progress Notes (Signed)
Assessment & Plan: HD#2 - Fall from steps Left rib fractures (7-8) Right wrist fracture Right tibial plateau fracture - NWB for 2 weeks - Dr. Ave Filter  H/o NSTEMI H/o PE Chronic diastolic heart failure HTN BLE edema   Admit for pain control Pulmonary toilet / IS use - ordered  On eliquis- hold PO blood thinner. If surgery deemed necessary, can do lovenox while awaiting surgery.  If not, can resume eliquis.  Abnormal labs this AM - elevated glucose and potassium - appears labs were drawn from site above current IV.  Will repeat this AM.  PT/OT consults for NWB, fractures - ordered.  Advance to regular diet - no OR planned.         Darnell Level, MD       [facility]       Office: [phone]   Chief Complaint: Fall on steps  Subjective: Patient in bed, comfortable.  Tolerating liquid diet.  Objective: Vital signs in last 24 hours: Temp:  [97.8 F (36.6 C)-98.3 F (36.8 C)] 97.8 F (36.6 C) (08/07 0729) Pulse Rate:  [55-76] 55 (08/07 0729) Resp:  [15-33] 20 (08/07 0729) BP: (127-181)/(68-94) 129/76 (08/07 0729) SpO2:  [90 %-98 %] 98 % (08/07 0729) Weight:  [85.3 kg] 85.3 kg (08/06 1554) Last BM Date: 12/29/20  Intake/Output from previous day: 08/06 0701 - 08/07 0700 In: 240.2 [I.V.:240.2] Out: -  Intake/Output this shift: No intake/output data recorded.  Physical Exam: HEENT - sclerae clear, mucous membranes moist Neck - soft Chest - clear bilaterally Cor - RRR Abdomen - soft without distension; BS present; non-tender Ext - right wrist in splint; right knee immobilizer in place Neuro - alert & oriented, no focal deficits  Lab Results:  Recent Labs    12/31/20 1359 01/01/21 0549  WBC 5.8 4.9  HGB 13.0 11.8*  HCT 41.1 36.2  PLT 173 156   BMET Recent Labs    12/31/20 1359 01/01/21 0549  NA 139 133*  K 4.0 6.3*  CL 106 105  CO2 26 25  GLUCOSE 96 476*  BUN 24* 19  CREATININE 1.04* 0.99  1.03*  CALCIUM 9.5 7.9*    PT/INR No results for input(s): LABPROT, INR in the last 72 hours. Comprehensive Metabolic Panel:    Component Value Date/Time   NA 133 (L) 01/01/2021 0549   NA 139 12/31/2020 1359   NA 142 07/04/2018 1404   NA 144 01/30/2018 1508   K 6.3 (HH) 01/01/2021 0549   K 4.0 12/31/2020 1359   CL 105 01/01/2021 0549   CL 106 12/31/2020 1359   CO2 25 01/01/2021 0549   CO2 26 12/31/2020 1359   BUN 19 01/01/2021 0549   BUN 24 (H) 12/31/2020 1359   BUN 19 07/04/2018 1404   BUN 18 01/30/2018 1508   CREATININE 1.03 (H) 01/01/2021 0549   CREATININE 0.99 01/01/2021 0549   CREATININE 0.93 11/18/2020 1248   CREATININE 1.04 (H) 08/17/2020 1015   GLUCOSE 476 (H) 01/01/2021 0549   GLUCOSE 96 12/31/2020 1359   CALCIUM 7.9 (L) 01/01/2021 0549   CALCIUM 9.5 12/31/2020 1359   AST 18 11/18/2020 1248   AST 17 08/17/2020 1015   ALT 13 11/18/2020 1248   ALT 20 08/17/2020 1015   ALKPHOS 59 11/18/2020 1248   ALKPHOS 74 08/17/2020 1015   BILITOT 0.8 11/18/2020 1248   BILITOT 0.6 08/17/2020 1015   PROT 7.6 11/18/2020 1248   PROT 8.0 08/17/2020 1015   PROT 7.3 09/25/2017  2423   ALBUMIN 3.9 11/18/2020 1248   ALBUMIN 4.0 08/17/2020 1015   ALBUMIN 4.5 09/25/2017 5361    Studies/Results: DG Ribs Unilateral Right  Result Date: 12/31/2020 CLINICAL DATA:  Acute RIGHT chest and rib pain following fall. Initial encounter. EXAM: RIGHT RIBS - 2 VIEW COMPARISON:  08/09/2020 radiographs FINDINGS: No fracture or other bone lesions are seen involving the ribs. IMPRESSION: Negative. Electronically Signed   By: Harmon Pier M.D.   On: 12/31/2020 15:12   DG Ribs Unilateral W/Chest Left  Result Date: 12/31/2020 CLINICAL DATA:  Fall, wrist and knee fracture.  Left chest pain. EXAM: LEFT RIBS AND CHEST - 3+ VIEW COMPARISON:  08/09/2020 FINDINGS: There are acute minimally displaced fractures of the left sixth and seventh ribs laterally. Remote healed fractures of the third and fourth ribs noted. Lung volumes are small and  there is bibasilar atelectasis. No pneumothorax or pleural effusion. Cardiac size within normal limits given poor pulmonary insufflation. Pulmonary vascularity is normal. IMPRESSION: Acute left sixth and seventh rib fractures.  No pneumothorax. Electronically Signed   By: Helyn Numbers MD   On: 12/31/2020 15:08   DG Wrist Complete Right  Result Date: 12/31/2020 CLINICAL DATA:  Fall.  Pain in the RIGHT wrist. EXAM: RIGHT WRIST - COMPLETE 3+ VIEW COMPARISON:  None. FINDINGS: There is lucency traversing the triquetrum and mild irregularity along the dorsum of the wrist on the LATERAL view. Findings raise a question of triquetrum fracture. Alignment of the carpals is otherwise normal. No radiopaque foreign body. IMPRESSION: Possible triquetrum fracture. Electronically Signed   By: Norva Pavlov M.D.   On: 12/31/2020 15:09   DG Tibia/Fibula Right  Result Date: 12/31/2020 CLINICAL DATA:  Larey Seat yesterday. RIGHT hip pain. RIGHT LOWER leg pain. EXAM: RIGHT TIBIA AND FIBULA - 2 VIEW COMPARISON:  None. FINDINGS: There are marked degenerative changes involving MEDIAL, LATERAL, in patellofemoral compartment of the knee. There is no acute fracture or subluxation. Note is made of pes planus. Soft tissues are unremarkable. IMPRESSION: Significant degenerative changes at the knee. No evidence for acute abnormality. Electronically Signed   By: Norva Pavlov M.D.   On: 12/31/2020 16:59   CT HEAD WO CONTRAST ( )  Result Date: 12/31/2020 CLINICAL DATA:  87 year old female with head and neck injury from fall today. Initial encounter. EXAM: CT HEAD WITHOUT CONTRAST CT CERVICAL SPINE WITHOUT CONTRAST TECHNIQUE: Multidetector CT imaging of the head and cervical spine was performed following the standard protocol without intravenous contrast. Multiplanar CT image reconstructions of the cervical spine were also generated. COMPARISON:  08/23/2004 cervical spine radiographs FINDINGS: CT HEAD FINDINGS Brain: No evidence of acute  infarction, hemorrhage, hydrocephalus, extra-axial collection or mass lesion/mass effect. Atrophy, probable mild chronic small-vessel white matter ischemic changes and bilateral globus pallidus calcifications noted. Vascular: Carotid atherosclerotic calcifications are noted. Skull: Normal. Negative for fracture or focal lesion. Sinuses/Orbits: No acute abnormality. Mucosal thickening within the RIGHT maxillary sinus noted. Other: None CT CERVICAL SPINE FINDINGS Alignment: Normal. Skull base and vertebrae: No acute fracture. No primary bone lesion or focal pathologic process. Soft tissues and spinal canal: No prevertebral fluid or swelling. No visible canal hematoma. Disc levels: Moderate multilevel degenerative disc disease, spondylosis and facet arthropathy noted contributing to mild central spinal and mild to moderate bony foraminal narrowing at multiple levels. Upper chest: No acute abnormality Other: None IMPRESSION: 1. No evidence of acute intracranial abnormality. Atrophy and probable mild chronic small-vessel white matter ischemic changes. 2. No static evidence of acute injury to the  cervical spine. Moderate multilevel degenerative changes. Electronically Signed   By: Harmon Pier M.D.   On: 12/31/2020 17:18   CT Cervical Spine Wo Contrast  Result Date: 12/31/2020 CLINICAL DATA:  87 year old female with head and neck injury from fall today. Initial encounter. EXAM: CT HEAD WITHOUT CONTRAST CT CERVICAL SPINE WITHOUT CONTRAST TECHNIQUE: Multidetector CT imaging of the head and cervical spine was performed following the standard protocol without intravenous contrast. Multiplanar CT image reconstructions of the cervical spine were also generated. COMPARISON:  08/23/2004 cervical spine radiographs FINDINGS: CT HEAD FINDINGS Brain: No evidence of acute infarction, hemorrhage, hydrocephalus, extra-axial collection or mass lesion/mass effect. Atrophy, probable mild chronic small-vessel white matter ischemic changes  and bilateral globus pallidus calcifications noted. Vascular: Carotid atherosclerotic calcifications are noted. Skull: Normal. Negative for fracture or focal lesion. Sinuses/Orbits: No acute abnormality. Mucosal thickening within the RIGHT maxillary sinus noted. Other: None CT CERVICAL SPINE FINDINGS Alignment: Normal. Skull base and vertebrae: No acute fracture. No primary bone lesion or focal pathologic process. Soft tissues and spinal canal: No prevertebral fluid or swelling. No visible canal hematoma. Disc levels: Moderate multilevel degenerative disc disease, spondylosis and facet arthropathy noted contributing to mild central spinal and mild to moderate bony foraminal narrowing at multiple levels. Upper chest: No acute abnormality Other: None IMPRESSION: 1. No evidence of acute intracranial abnormality. Atrophy and probable mild chronic small-vessel white matter ischemic changes. 2. No static evidence of acute injury to the cervical spine. Moderate multilevel degenerative changes. Electronically Signed   By: Harmon Pier M.D.   On: 12/31/2020 17:18   CT Knee Right Wo Contrast  Result Date: 12/31/2020 CLINICAL DATA:  Limping, acute, right knee pain EXAM: CT OF THE radiograph 12/31/2020 KNEE WITHOUT CONTRAST TECHNIQUE: Multidetector CT imaging of the RIGHT knee was performed according to the standard protocol. Multiplanar CT image reconstructions were also generated. COMPARISON:  Radiograph 12/31/2020 FINDINGS: Bones/Joint/Cartilage Moderate to severe tricompartmental degenerative changes including essentially bone-on-bone contact in the significantly narrowed medial femorotibial and patellofemoral compartments with exuberant periarticular spurring. More moderate change seen in the partly narrowed lateral femorotibial compartment as well. Corticated joint body versus fabella posteriorly. Subcortical sclerosis involving the medial tibial plateau concerning for an underlying osteochondral lesion. Conspicuous  linear lucency concerning for nondisplaced fracture seen extending through the posteromedial corner on sagittal imaging and into the exuberant posterior osteophytes (10/75). Large knee joint effusion with evidence of synovitis. Partially decompressing into a Baker's cyst posteriorly demonstrating a layering fluid-fluid level. Ligaments Suboptimally assessed by CT. Muscles and Tendons No clear musculotendinous injury.  No retracted or torn tendons. Soft tissues Soft tissue swelling of the knee is most pronounced anteriorly. Joint effusion and bearing Baker's cyst, as above. Extensive atherosclerotic calcification. Vasculature incompletely assessed. IMPRESSION: Diffuse bony demineralization, may limit detection of subtle injury. Features concerning for a nondisplaced fracture lucency through the posteromedial corner of the tibial plateau. Additional subcortical sclerotic changes in a configuration suspicious for osteochondral lesion/spontaneous osteonecrosis of the medial tibial plateau weight-bearing articular surface as well. Large effusion partially decompressing into a Baker's cyst with evidence of synovitis and layering fluid fluid levels which can reflect hemarthrosis in the setting of fracture. Moderate to severe tricompartmental degenerative changes most pronounced in the medial femorotibial and patellofemoral compartments where there is essentially complete effacement of the joint space. Atherosclerosis. Electronically Signed   By: Kreg Shropshire M.D.   On: 12/31/2020 19:21   DG Knee Complete 4 Views Right  Result Date: 12/31/2020 CLINICAL DATA:  87 year old female with  acute RIGHT knee pain following fall today. Initial encounter. EXAM: RIGHT KNEE - COMPLETE 4+ VIEW COMPARISON:  12/08/2016 FINDINGS: No acute fracture or dislocation. Severe tricompartmental joint space narrowing and osteophytosis noted. No joint effusion is present. IMPRESSION: 1. No acute abnormality. 2. Severe tricompartmental degenerative  changes. Electronically Signed   By: Harmon PierJeffrey  Hu M.D.   On: 12/31/2020 15:06   DG HIP UNILAT WITH PELVIS 2-3 VIEWS LEFT  Result Date: 12/31/2020 CLINICAL DATA:  Larey SeatFell yesterday.  Pain. EXAM: DG HIP (WITH OR WITHOUT PELVIS) 2-3V LEFT COMPARISON:  None. FINDINGS: There is no acute fracture or subluxation. Femoral artery calcification and vascular stents noted. Moderate stool burden in the rectosigmoid colon. There is sclerosis of the symphysis pubis. IMPRESSION: No evidence for acute  abnormality. Electronically Signed   By: Norva PavlovElizabeth  Brown M.D.   On: 12/31/2020 17:00   DG HIP UNILAT WITH PELVIS 2-3 VIEWS RIGHT  Result Date: 12/31/2020 CLINICAL DATA:  Larey SeatFell yesterday.  RIGHT hip pain. EXAM: DG HIP (WITH OR WITHOUT PELVIS) 2-3V RIGHT COMPARISON:  Pelvis 12/31/2020 FINDINGS: There is no acute fracture or subluxation. Note is made of dense atherosclerotic calcification of the femoral artery. There is sclerosis of the symphysis pubis. IMPRESSION: No evidence for acute  abnormality. Electronically Signed   By: Norva PavlovElizabeth  Brown M.D.   On: 12/31/2020 17:01      Darnell Levelodd Zemirah Krasinski 01/01/2021   Patient ID: Carl BestWillie M Jemison, female   DOB: 12/05/1931, 87 y.o.   MRN: [id number]

## 2021-01-01 NOTE — Progress Notes (Signed)
Orthopedic Tech Progress Note Patient Details:  Lynn Brown 1931/07/17 [id number]  Ortho Devices Type of Ortho Device: Velcro wrist splint Ortho Device/Splint Location: RUE Ortho Device/Splint Interventions: Ordered, Application, Adjustment   Post Interventions Patient Tolerated: Well Instructions Provided: Adjustment of device, Care of device, Poper ambulation with device  Vyncent Overby 01/01/2021, 2:38 PM

## 2021-01-01 NOTE — Progress Notes (Signed)
Received a critical lab value of k 6.3, on call MD notified with no new orders at this time.Pt remains alert/oriented in no apparent distress.. No complaints.

## 2021-01-01 NOTE — Consult Note (Signed)
Reason for Consult:R wrist fx Referring Physician: Dr. Deboraha Sprang Lynn Brown is an 87 y.o. female.  HPI: fall going downstairs occurred 12/30/20 daughter found her on the floor she came to ED with workup showing two rib fractures, and possible wrist fracture and possible tibial plateau fractures.  Given continued issues with weight bearing, she was admitted to medicine.  She denies any falls in the past few years until this one.  She had no dizziness or syncope surrounding the fall.  She states that she was talking to her son and wasn't paying close attention to what she was doing.  Past Medical History:  Diagnosis Date   Allergic rhinitis    Anxiety    Bunion    PES PLANUS   Hearing loss    HEARING AIDS, BUT DOES NOT WEAR THEM CONSISTENTLY.    Hypercholesteremia    Hypertension    Hypothyroid    Leaky heart valve    DR. HARWANI   Mild obesity    Osteoarthritis of knees, bilateral    Osteoporosis 12/2017   T score -3.3   Venous insufficiency     Past Surgical History:  Procedure Laterality Date   ABDOMINAL AORTOGRAM W/LOWER EXTREMITY Left 05/04/2020   Procedure: ABDOMINAL AORTOGRAM W/LOWER EXTREMITY;  Surgeon: Leonie Douglas, MD;  Location: [facility];  Service: Cardiovascular;  Laterality: Left;   CATARACT EXTRACTION, BILATERAL  2013   DR. SHAPIRO    HYSTEROSCOPY     D & C   LEFT HEART CATH AND CORONARY ANGIOGRAPHY N/A 03/10/2018   Procedure: LEFT HEART CATH AND CORONARY ANGIOGRAPHY;  Surgeon: Lyn Records, MD;  Location: [facility];  Service: Cardiovascular;  Laterality: N/A;   NEPHRECTOMY LIVING DONOR  1978   PERIPHERAL VASCULAR INTERVENTION Left 05/04/2020   Procedure: PERIPHERAL VASCULAR INTERVENTION;  Surgeon: Leonie Douglas, MD;  Location: [facility];  Service: Cardiovascular;  Laterality: Left;  Femoral popliteal    Family History  Problem Relation Age of Onset   Hypertension Mother    CVA Mother    Diabetes Sister    Hypertension  Sister    Heart disease Sister    Multiple sclerosis Sister    Hypertension Father    CVA Father    Cancer Brother        prostrate   Hypertension Brother    CVA Brother    Seizures Brother    Pancreatic cancer Sister    Kidney disease Sister    Kidney failure Sister    Other Brother        PNEUMONIA AS A BABY   Hypertension Brother    Lymphoma Daughter    Deep vein thrombosis Sister    Pulmonary embolism Niece    Breast cancer Neg Hx     Social History:  reports that she has never smoked. She has never used smokeless tobacco. She reports that she does not drink alcohol and does not use drugs.  Allergies: No Known Allergies  Medications: I have reviewed the patient's current medications.  Results for orders placed or performed during the hospital encounter of 12/31/20 (from the past 48 hour(s))  CBC     Status: None   Collection Time: 12/31/20  1:59 PM  Result Value Ref Range   WBC 5.8 4.0 - 10.5 K/uL   RBC 4.26 3.87 - 5.11 MIL/uL   Hemoglobin 13.0 12.0 - 15.0 g/dL   HCT 40.9 81.1 - 91.4 %   MCV 96.5  80.0 - 100.0 fL   MCH 30.5 26.0 - 34.0 pg   MCHC 31.6 30.0 - 36.0 g/dL   RDW 22.6 33.3 - 54.5 %   Platelets 173 150 - 400 K/uL   nRBC 0.0 0.0 - 0.2 %    Comment: Performed at [facility], [address], [place], [place] [address]  Basic metabolic panel     Status: Abnormal   Collection Time: 12/31/20  1:59 PM  Result Value Ref Range   Sodium 139 135 - 145 mmol/L   Potassium 4.0 3.5 - 5.1 mmol/L   Chloride 106 98 - 111 mmol/L   CO2 26 22 - 32 mmol/L   Glucose, Bld 96 70 - 99 mg/dL    Comment: Glucose reference range applies only to samples taken after fasting for at least 8 hours.   BUN 24 (H) 8 - 23 mg/dL   Creatinine, Ser 8.93 (H) 0.44 - 1.00 mg/dL   Calcium 9.5 8.9 - 73.4 mg/dL   GFR, Estimated 52 (L) >60 mL/min    Comment: (NOTE) Calculated using the CKD-EPI Creatinine Equation (2021)    Anion gap 7 5 - 15    Comment: Performed at [facility]  [facility], [address] [place], [place] [address]  Resp Panel by RT-PCR (Flu A&B, Covid) Nasopharyngeal Swab     Status: None   Collection Time: 12/31/20  8:01 PM   Specimen: Nasopharyngeal Swab; Nasopharyngeal(NP) swabs in vial transport medium  Result Value Ref Range   SARS Coronavirus 2 by RT PCR NEGATIVE NEGATIVE    Comment: (NOTE) SARS-CoV-2 target nucleic acids are NOT DETECTED.  The SARS-CoV-2 RNA is generally detectable in upper respiratory specimens during the acute phase of infection. The lowest concentration of SARS-CoV-2 viral copies this assay can detect is 138 copies/mL. A negative result does not preclude SARS-Cov-2 infection and should not be used as the sole basis for treatment or other patient management decisions. A negative result may occur with  improper specimen collection/handling, submission of specimen other than nasopharyngeal swab, presence of viral mutation(s) within the areas targeted by this assay, and inadequate number of viral copies(<138 copies/mL). A negative result must be combined with clinical observations, patient history, and epidemiological information. The expected result is Negative.  Fact Sheet for Patients:  BloggerCourse.com  Fact Sheet for Healthcare Providers:  SeriousBroker.it  This test is no t yet approved or cleared by the [place] FDA and  has been authorized for detection and/or diagnosis of SARS-CoV-2 by FDA under an Emergency Use Authorization (EUA). This EUA will remain  in effect (meaning this test can be used) for the duration of the COVID-19 declaration under Section 564(b)(1) of the Act, 21 U.S.C.section 360bbb-3(b)(1), unless the authorization is terminated  or revoked sooner.       Influenza A by PCR NEGATIVE NEGATIVE   Influenza B by PCR NEGATIVE NEGATIVE    Comment: (NOTE) The Xpert Xpress SARS-CoV-2/FLU/RSV plus assay is intended as an aid in the diagnosis of  influenza from Nasopharyngeal swab specimens and should not be used as a sole basis for treatment. Nasal washings and aspirates are unacceptable for Xpert Xpress SARS-CoV-2/FLU/RSV testing.  Fact Sheet for Patients: BloggerCourse.com  Fact Sheet for Healthcare Providers: SeriousBroker.it  This test is not yet approved or cleared by the [place] FDA and has been authorized for detection and/or diagnosis of SARS-CoV-2 by FDA under an Emergency Use Authorization (EUA). This EUA will remain in effect (meaning this test can be  used) for the duration of the COVID-19 declaration under Section 564(b)(1) of the Act, 21 U.S.C. section 360bbb-3(b)(1), unless the authorization is terminated or revoked.  Performed at [facility], [address] [place], [place] [address]   Creatinine, serum     Status: Abnormal   Collection Time: 01/01/21  5:49 AM  Result Value Ref Range   Creatinine, Ser 1.03 (H) 0.44 - 1.00 mg/dL   GFR, Estimated 53 (L) >60 mL/min    Comment: (NOTE) Calculated using the CKD-EPI Creatinine Equation (2021) Performed at [facility], [address] [place], [place] [address]   CBC     Status: Abnormal   Collection Time: 01/01/21  5:49 AM  Result Value Ref Range   WBC 4.9 4.0 - 10.5 K/uL   RBC 3.76 (L) 3.87 - 5.11 MIL/uL   Hemoglobin 11.8 (L) 12.0 - 15.0 g/dL   HCT 09.836.2 11.936.0 - 14.746.0 %   MCV 96.3 80.0 - 100.0 fL   MCH 31.4 26.0 - 34.0 pg   MCHC 32.6 30.0 - 36.0 g/dL   RDW 82.914.1 56.211.5 - 13.015.5 %   Platelets 156 150 - 400 K/uL   nRBC 0.0 0.0 - 0.2 %    Comment: Performed at [facility], [address] [place], [place] [address]  Basic metabolic panel     Status: Abnormal   Collection Time: 01/01/21  5:49 AM  Result Value Ref Range   Sodium 133 (L) 135 - 145 mmol/L   Potassium 6.3 (HH) 3.5 - 5.1 mmol/L    Comment: CRITICAL RESULT CALLED TO, READ BACK BY AND VERIFIED WITH: K KIANG RN BY SSTEPHENS  [id number]    Chloride 105 98 - 111 mmol/L   CO2 25 22 - 32 mmol/L   Glucose, Bld 476 (H) 70 - 99 mg/dL    Comment: Glucose reference range applies only to samples taken after fasting for at least 8 hours.   BUN 19 8 - 23 mg/dL   Creatinine, Ser 4.690.99 0.44 - 1.00 mg/dL   Calcium 7.9 (L) 8.9 - 10.3 mg/dL   GFR, Estimated 55 (L) >60 mL/min    Comment: (NOTE) Calculated using the CKD-EPI Creatinine Equation (2021)    Anion gap 3 (L) 5 - 15    Comment: Performed at [facility], [address] [place], [place] [address]  Glucose, capillary     Status: Abnormal   Collection Time: 01/01/21  9:41 AM  Result Value Ref Range   Glucose-Capillary 107 (H) 70 - 99 mg/dL    Comment: Glucose reference range applies only to samples taken after fasting for at least 8 hours.    DG Ribs Unilateral Right  Result Date: 12/31/2020 CLINICAL DATA:  Acute RIGHT chest and rib pain following fall. Initial encounter. EXAM: RIGHT RIBS - 2 VIEW COMPARISON:  08/09/2020 radiographs FINDINGS: No fracture or other bone lesions are seen involving the ribs. IMPRESSION: Negative. Electronically Signed   By: Harmon PierJeffrey  Hu M.D.   On: 12/31/2020 15:12   DG Ribs Unilateral W/Chest Left  Result Date: 12/31/2020 CLINICAL DATA:  Fall, wrist and knee fracture.  Left chest pain. EXAM: LEFT RIBS AND CHEST - 3+ VIEW COMPARISON:  08/09/2020 FINDINGS: There are acute minimally displaced fractures of the left sixth and seventh ribs laterally. Remote healed fractures of the third and fourth ribs noted. Lung volumes are small and there is bibasilar atelectasis. No pneumothorax or pleural effusion. Cardiac size within normal limits given poor  pulmonary insufflation. Pulmonary vascularity is normal. IMPRESSION: Acute left sixth and seventh rib fractures.  No pneumothorax. Electronically Signed   By: Helyn Numbers MD   On: 12/31/2020 15:08   DG Wrist Complete Right  Result Date: 12/31/2020 CLINICAL DATA:  Fall.  Pain in the RIGHT wrist.  EXAM: RIGHT WRIST - COMPLETE 3+ VIEW COMPARISON:  None. FINDINGS: There is lucency traversing the triquetrum and mild irregularity along the dorsum of the wrist on the LATERAL view. Findings raise a question of triquetrum fracture. Alignment of the carpals is otherwise normal. No radiopaque foreign body. IMPRESSION: Possible triquetrum fracture. Electronically Signed   By: Norva Pavlov M.D.   On: 12/31/2020 15:09   DG Tibia/Fibula Right  Result Date: 12/31/2020 CLINICAL DATA:  Larey Seat yesterday. RIGHT hip pain. RIGHT LOWER leg pain. EXAM: RIGHT TIBIA AND FIBULA - 2 VIEW COMPARISON:  None. FINDINGS: There are marked degenerative changes involving MEDIAL, LATERAL, in patellofemoral compartment of the knee. There is no acute fracture or subluxation. Note is made of pes planus. Soft tissues are unremarkable. IMPRESSION: Significant degenerative changes at the knee. No evidence for acute abnormality. Electronically Signed   By: Norva Pavlov M.D.   On: 12/31/2020 16:59   CT HEAD WO CONTRAST ( )  Result Date: 12/31/2020 CLINICAL DATA:  87 year old female with head and neck injury from fall today. Initial encounter. EXAM: CT HEAD WITHOUT CONTRAST CT CERVICAL SPINE WITHOUT CONTRAST TECHNIQUE: Multidetector CT imaging of the head and cervical spine was performed following the standard protocol without intravenous contrast. Multiplanar CT image reconstructions of the cervical spine were also generated. COMPARISON:  08/23/2004 cervical spine radiographs FINDINGS: CT HEAD FINDINGS Brain: No evidence of acute infarction, hemorrhage, hydrocephalus, extra-axial collection or mass lesion/mass effect. Atrophy, probable mild chronic small-vessel white matter ischemic changes and bilateral globus pallidus calcifications noted. Vascular: Carotid atherosclerotic calcifications are noted. Skull: Normal. Negative for fracture or focal lesion. Sinuses/Orbits: No acute abnormality. Mucosal thickening within the RIGHT maxillary  sinus noted. Other: None CT CERVICAL SPINE FINDINGS Alignment: Normal. Skull base and vertebrae: No acute fracture. No primary bone lesion or focal pathologic process. Soft tissues and spinal canal: No prevertebral fluid or swelling. No visible canal hematoma. Disc levels: Moderate multilevel degenerative disc disease, spondylosis and facet arthropathy noted contributing to mild central spinal and mild to moderate bony foraminal narrowing at multiple levels. Upper chest: No acute abnormality Other: None IMPRESSION: 1. No evidence of acute intracranial abnormality. Atrophy and probable mild chronic small-vessel white matter ischemic changes. 2. No static evidence of acute injury to the cervical spine. Moderate multilevel degenerative changes. Electronically Signed   By: Harmon Pier M.D.   On: 12/31/2020 17:18   CT Cervical Spine Wo Contrast  Result Date: 12/31/2020 CLINICAL DATA:  87 year old female with head and neck injury from fall today. Initial encounter. EXAM: CT HEAD WITHOUT CONTRAST CT CERVICAL SPINE WITHOUT CONTRAST TECHNIQUE: Multidetector CT imaging of the head and cervical spine was performed following the standard protocol without intravenous contrast. Multiplanar CT image reconstructions of the cervical spine were also generated. COMPARISON:  08/23/2004 cervical spine radiographs FINDINGS: CT HEAD FINDINGS Brain: No evidence of acute infarction, hemorrhage, hydrocephalus, extra-axial collection or mass lesion/mass effect. Atrophy, probable mild chronic small-vessel white matter ischemic changes and bilateral globus pallidus calcifications noted. Vascular: Carotid atherosclerotic calcifications are noted. Skull: Normal. Negative for fracture or focal lesion. Sinuses/Orbits: No acute abnormality. Mucosal thickening within the RIGHT maxillary sinus noted. Other: None CT CERVICAL SPINE FINDINGS Alignment: Normal. Skull base and  vertebrae: No acute fracture. No primary bone lesion or focal pathologic  process. Soft tissues and spinal canal: No prevertebral fluid or swelling. No visible canal hematoma. Disc levels: Moderate multilevel degenerative disc disease, spondylosis and facet arthropathy noted contributing to mild central spinal and mild to moderate bony foraminal narrowing at multiple levels. Upper chest: No acute abnormality Other: None IMPRESSION: 1. No evidence of acute intracranial abnormality. Atrophy and probable mild chronic small-vessel white matter ischemic changes. 2. No static evidence of acute injury to the cervical spine. Moderate multilevel degenerative changes. Electronically Signed   By: Harmon Pier M.D.   On: 12/31/2020 17:18   CT Knee Right Wo Contrast  Result Date: 12/31/2020 CLINICAL DATA:  Limping, acute, right knee pain EXAM: CT OF THE radiograph 12/31/2020 KNEE WITHOUT CONTRAST TECHNIQUE: Multidetector CT imaging of the RIGHT knee was performed according to the standard protocol. Multiplanar CT image reconstructions were also generated. COMPARISON:  Radiograph 12/31/2020 FINDINGS: Bones/Joint/Cartilage Moderate to severe tricompartmental degenerative changes including essentially bone-on-bone contact in the significantly narrowed medial femorotibial and patellofemoral compartments with exuberant periarticular spurring. More moderate change seen in the partly narrowed lateral femorotibial compartment as well. Corticated joint body versus fabella posteriorly. Subcortical sclerosis involving the medial tibial plateau concerning for an underlying osteochondral lesion. Conspicuous linear lucency concerning for nondisplaced fracture seen extending through the posteromedial corner on sagittal imaging and into the exuberant posterior osteophytes (10/75). Large knee joint effusion with evidence of synovitis. Partially decompressing into a Baker's cyst posteriorly demonstrating a layering fluid-fluid level. Ligaments Suboptimally assessed by CT. Muscles and Tendons No clear musculotendinous  injury.  No retracted or torn tendons. Soft tissues Soft tissue swelling of the knee is most pronounced anteriorly. Joint effusion and bearing Baker's cyst, as above. Extensive atherosclerotic calcification. Vasculature incompletely assessed. IMPRESSION: Diffuse bony demineralization, may limit detection of subtle injury. Features concerning for a nondisplaced fracture lucency through the posteromedial corner of the tibial plateau. Additional subcortical sclerotic changes in a configuration suspicious for osteochondral lesion/spontaneous osteonecrosis of the medial tibial plateau weight-bearing articular surface as well. Large effusion partially decompressing into a Baker's cyst with evidence of synovitis and layering fluid fluid levels which can reflect hemarthrosis in the setting of fracture. Moderate to severe tricompartmental degenerative changes most pronounced in the medial femorotibial and patellofemoral compartments where there is essentially complete effacement of the joint space. Atherosclerosis. Electronically Signed   By: Kreg Shropshire M.D.   On: 12/31/2020 19:21   DG Knee Complete 4 Views Right  Result Date: 12/31/2020 CLINICAL DATA:  87 year old female with acute RIGHT knee pain following fall today. Initial encounter. EXAM: RIGHT KNEE - COMPLETE 4+ VIEW COMPARISON:  12/08/2016 FINDINGS: No acute fracture or dislocation. Severe tricompartmental joint space narrowing and osteophytosis noted. No joint effusion is present. IMPRESSION: 1. No acute abnormality. 2. Severe tricompartmental degenerative changes. Electronically Signed   By: Harmon Pier M.D.   On: 12/31/2020 15:06   DG HIP UNILAT WITH PELVIS 2-3 VIEWS LEFT  Result Date: 12/31/2020 CLINICAL DATA:  Larey Seat yesterday.  Pain. EXAM: DG HIP (WITH OR WITHOUT PELVIS) 2-3V LEFT COMPARISON:  None. FINDINGS: There is no acute fracture or subluxation. Femoral artery calcification and vascular stents noted. Moderate stool burden in the rectosigmoid colon.  There is sclerosis of the symphysis pubis. IMPRESSION: No evidence for acute  abnormality. Electronically Signed   By: Norva Pavlov M.D.   On: 12/31/2020 17:00   DG HIP UNILAT WITH PELVIS 2-3 VIEWS RIGHT  Result Date: 12/31/2020 CLINICAL DATA:  Fell yesterday.  RIGHT hip pain. EXAM: DG HIP (WITH OR WITHOUT PELVIS) 2-3V RIGHT COMPARISON:  Pelvis 12/31/2020 FINDINGS: There is no acute fracture or subluxation. Note is made of dense atherosclerotic calcification of the femoral artery. There is sclerosis of the symphysis pubis. IMPRESSION: No evidence for acute  abnormality. Electronically Signed   By: Norva Pavlov M.D.   On: 12/31/2020 17:01    Review of Systems  Musculoskeletal:  Positive for arthralgias.  Blood pressure 111/71, pulse 70, temperature 97.7 F (36.5 C), temperature source Oral, resp. rate 16, height  (1.549 m), weight 85.3 kg, SpO2 94 %. Physical Exam Constitutional:      General: She is not in acute distress.    Appearance: Normal appearance.  Cardiovascular:     Rate and Rhythm: Normal rate.     Pulses: Normal pulses.  Pulmonary:     Effort: Pulmonary effort is normal. No respiratory distress.  Abdominal:     General: Abdomen is flat. There is no distension.  Musculoskeletal:     Comments: Minimal swelling of the wrist with splint removal, ROM intact slight discomfort, TTP ulnar wrist, grip intact. RHD.   Skin:    General: Skin is warm and dry.     Findings: No erythema or rash.  Neurological:     General: No focal deficit present.     Mental Status: She is alert and oriented to person, place, and time.  Psychiatric:        Mood and Affect: Mood normal.        Behavior: Behavior normal.    Assessment/Plan: HAND CONSULT:  R triquetrum fracture nondisplaced  Will transition from current sugartong splint to removable velcro wrist splint.  Recommendations for knee looked like NWB x2 weeks for possible post med tibial plateau fx with mod-severe underlying  OA of the knee.  She may put some weight through the wrist in the splint, if this is not comfortable may obtain a platform walker if needed.  Wrist fractre can be f/u by same ortho as knee will likely be nonoperative management with a repeat xray in the next few weeks.  Ivin Booty Audley Hinojos 01/01/2021, 11:56 AM

## 2021-01-02 ENCOUNTER — Inpatient Hospital Stay (HOSPITAL_COMMUNITY): Payer: Medicare Other

## 2021-01-02 LAB — BASIC METABOLIC PANEL
Anion gap: 8 (ref 5–15)
BUN: 15 mg/dL (ref 8–23)
CO2: 24 mmol/L (ref 22–32)
Calcium: 9 mg/dL (ref 8.9–10.3)
Chloride: 104 mmol/L (ref 98–111)
Creatinine, Ser: 0.91 mg/dL (ref 0.44–1.00)
GFR, Estimated: >60 mL/min (ref >60)
Glucose, Bld: 112 mg/dL — ABNORMAL HIGH (ref 70–99)
Potassium: 4.5 mmol/L (ref 3.5–5.1)
Sodium: 136 mmol/L (ref 135–145)

## 2021-01-02 LAB — CBC
HCT: 40.6 % (ref 36.0–46.0)
Hemoglobin: 13 g/dL (ref 12.0–15.0)
MCH: 30.7 pg (ref 26.0–34.0)
MCHC: 32 g/dL (ref 30.0–36.0)
MCV: 96 fL (ref 80.0–100.0)
Platelets: 156 10*3/uL (ref 150–400)
RBC: 4.23 MIL/uL (ref 3.87–5.11)
RDW: 13.8 % (ref 11.5–15.5)
WBC: 5 10*3/uL (ref 4.0–10.5)
nRBC: 0 % (ref 0.0–0.2)

## 2021-01-02 MED ORDER — ACETAMINOPHEN 500 MG PO TABS: 1000.0000 mg | ORAL_TABLET | Freq: Four times a day (QID) | ORAL | Status: DC

## 2021-01-02 MED ORDER — POLYETHYLENE GLYCOL 3350 17 G PO PACK
17.0000 g | PACK | Freq: Every day | ORAL | Status: DC | PRN
  Filled 2021-01-02: qty 1

## 2021-01-02 MED ORDER — APIXABAN 5 MG PO TABS
5.0000 mg | ORAL_TABLET | Freq: Two times a day (BID) | ORAL | Status: DC
  Administered 2021-01-02 – 2021-01-05 (×6): 5 mg via ORAL
  Filled 2021-01-02 (×6): qty 1

## 2021-01-02 MED ORDER — METHOCARBAMOL 500 MG PO TABS
500.0000 mg | ORAL_TABLET | Freq: Four times a day (QID) | ORAL | Status: DC | PRN
  Administered 2021-01-04: 500 mg via ORAL
  Filled 2021-01-02: qty 1

## 2021-01-02 MED ORDER — LIDOCAINE 5 % EX PTCH
1.0000 | MEDICATED_PATCH | CUTANEOUS | Status: DC
  Administered 2021-01-02 – 2021-01-05 (×4): 1 via TRANSDERMAL
  Filled 2021-01-02 (×4): qty 1

## 2021-01-02 MED ORDER — FENTANYL CITRATE (PF) 100 MCG/2ML IJ SOLN
25.0000 ug | INTRAMUSCULAR | Status: DC | PRN
Start: 2021-01-02 — End: 2021-01-05

## 2021-01-02 MED ORDER — OXYCODONE HCL 5 MG PO TABS
5.0000 mg | ORAL_TABLET | Freq: Four times a day (QID) | ORAL | Status: DC | PRN
  Administered 2021-01-02 – 2021-01-04 (×7): 10 mg via ORAL
  Administered 2021-01-05: 5 mg via ORAL
  Filled 2021-01-02 (×5): qty 2
  Filled 2021-01-02: qty 1
  Filled 2021-01-02 (×2): qty 2

## 2021-01-02 MED ORDER — ACETAMINOPHEN 500 MG PO TABS
1000.0000 mg | ORAL_TABLET | Freq: Four times a day (QID) | ORAL | Status: DC
  Administered 2021-01-02 – 2021-01-05 (×12): 1000 mg via ORAL
  Filled 2021-01-02 (×13): qty 2

## 2021-01-02 NOTE — Progress Notes (Addendum)
Subjective: CC: Doing well. Pain over the left ribs, right wrist and right knee where she has known fx's. She denies other areas of pain. No sob. No abdominal pain, n/v. Has not gotten out of bed. She is voiding. Tolerating diet. No BM since admission. Lives at home with her daughter. Uses a cane occasionally.   Objective: Vital signs in last 24 hours: Temp:  [97.6 F (36.4 C)-98.2 F (36.8 C)] 97.8 F (36.6 C) (08/08 0810) Pulse Rate:  [57-70] 63 (08/08 0810) Resp:  [15-17] 17 (08/08 0810) BP: (111-137)/(71-86) 128/86 (08/08 0810) SpO2:  [90 %-97 %] 90 % (08/08 0810) Last BM Date: 12/29/20  Intake/Output from previous day: 08/07 0701 - 08/08 0700 In: 432.8 [I.V.:432.8] Out: 1750 [Urine:1750] Intake/Output this shift: No intake/output data recorded.  PE: Gen:  Alert, NAD, pleasant HEENT: EOM's intact, pupils equal and round Card:  RRR Pulm:  CTAB, no W/R/R, effort normal, left rib ttp Abd: Soft, ND, NT +BS Ext: R wrist splint in place. Digitis wwp. Right knee immobilizer in place. Trace b/l LE edema.  Psych: A&Ox3  Skin: no rashes noted, warm and dry  Lab Results:  Recent Labs    12/31/20 1359 01/01/21 0549  WBC 5.8 4.9  HGB 13.0 11.8*  HCT 41.1 36.2  PLT 173 156   BMET Recent Labs    01/01/21 0549 01/01/21 1104  NA 133* 136  K 6.3* 4.4  CL 105 106  CO2 25 21*  GLUCOSE 476* 123*  BUN 19 19  CREATININE 0.99  1.03* 0.90  CALCIUM 7.9* 8.9   PT/INR No results for input(s): LABPROT, INR in the last 72 hours. CMP     Component Value Date/Time   NA 136 01/01/2021 1104   NA 142 07/04/2018 1404   K 4.4 01/01/2021 1104   CL 106 01/01/2021 1104   CO2 21 (L) 01/01/2021 1104   GLUCOSE 123 (H) 01/01/2021 1104   BUN 19 01/01/2021 1104   BUN 19 07/04/2018 1404   CREATININE 0.90 01/01/2021 1104   CREATININE 0.93 11/18/2020 1248   CALCIUM 8.9 01/01/2021 1104   PROT 7.6 11/18/2020 1248   PROT 7.3 09/25/2017 0943   ALBUMIN 3.9 11/18/2020 1248    ALBUMIN 4.5 09/25/2017 0943   AST 18 11/18/2020 1248   ALT 13 11/18/2020 1248   ALKPHOS 59 11/18/2020 1248   BILITOT 0.8 11/18/2020 1248   GFRNONAA >60 01/01/2021 1104   GFRNONAA 60 (L) 11/18/2020 1248   GFRAA 58 (L) 07/04/2018 1404   Lipase  No results found for: LIPASE  Studies/Results: DG Ribs Unilateral Right  Result Date: 12/31/2020 CLINICAL DATA:  Acute RIGHT chest and rib pain following fall. Initial encounter. EXAM: RIGHT RIBS - 2 VIEW COMPARISON:  08/09/2020 radiographs FINDINGS: No fracture or other bone lesions are seen involving the ribs. IMPRESSION: Negative. Electronically Signed   By: Harmon PierJeffrey  Hu M.D.   On: 12/31/2020 15:12   DG Ribs Unilateral W/Chest Left  Result Date: 12/31/2020 CLINICAL DATA:  Fall, wrist and knee fracture.  Left chest pain. EXAM: LEFT RIBS AND CHEST - 3+ VIEW COMPARISON:  08/09/2020 FINDINGS: There are acute minimally displaced fractures of the left sixth and seventh ribs laterally. Remote healed fractures of the third and fourth ribs noted. Lung volumes are small and there is bibasilar atelectasis. No pneumothorax or pleural effusion. Cardiac size within normal limits given poor pulmonary insufflation. Pulmonary vascularity is normal. IMPRESSION: Acute left sixth and seventh rib fractures.  No pneumothorax.  Electronically Signed   By: Helyn Numbers MD   On: 12/31/2020 15:08   DG Wrist Complete Right  Result Date: 12/31/2020 CLINICAL DATA:  Fall.  Pain in the RIGHT wrist. EXAM: RIGHT WRIST - COMPLETE 3+ VIEW COMPARISON:  None. FINDINGS: There is lucency traversing the triquetrum and mild irregularity along the dorsum of the wrist on the LATERAL view. Findings raise a question of triquetrum fracture. Alignment of the carpals is otherwise normal. No radiopaque foreign body. IMPRESSION: Possible triquetrum fracture. Electronically Signed   By: Norva Pavlov M.D.   On: 12/31/2020 15:09   DG Tibia/Fibula Right  Result Date: 12/31/2020 CLINICAL DATA:  Larey Seat  yesterday. RIGHT hip pain. RIGHT LOWER leg pain. EXAM: RIGHT TIBIA AND FIBULA - 2 VIEW COMPARISON:  None. FINDINGS: There are marked degenerative changes involving MEDIAL, LATERAL, in patellofemoral compartment of the knee. There is no acute fracture or subluxation. Note is made of pes planus. Soft tissues are unremarkable. IMPRESSION: Significant degenerative changes at the knee. No evidence for acute abnormality. Electronically Signed   By: Norva Pavlov M.D.   On: 12/31/2020 16:59   CT HEAD WO CONTRAST ( )  Result Date: 12/31/2020 CLINICAL DATA:  87 year old female with head and neck injury from fall today. Initial encounter. EXAM: CT HEAD WITHOUT CONTRAST CT CERVICAL SPINE WITHOUT CONTRAST TECHNIQUE: Multidetector CT imaging of the head and cervical spine was performed following the standard protocol without intravenous contrast. Multiplanar CT image reconstructions of the cervical spine were also generated. COMPARISON:  08/23/2004 cervical spine radiographs FINDINGS: CT HEAD FINDINGS Brain: No evidence of acute infarction, hemorrhage, hydrocephalus, extra-axial collection or mass lesion/mass effect. Atrophy, probable mild chronic small-vessel white matter ischemic changes and bilateral globus pallidus calcifications noted. Vascular: Carotid atherosclerotic calcifications are noted. Skull: Normal. Negative for fracture or focal lesion. Sinuses/Orbits: No acute abnormality. Mucosal thickening within the RIGHT maxillary sinus noted. Other: None CT CERVICAL SPINE FINDINGS Alignment: Normal. Skull base and vertebrae: No acute fracture. No primary bone lesion or focal pathologic process. Soft tissues and spinal canal: No prevertebral fluid or swelling. No visible canal hematoma. Disc levels: Moderate multilevel degenerative disc disease, spondylosis and facet arthropathy noted contributing to mild central spinal and mild to moderate bony foraminal narrowing at multiple levels. Upper chest: No acute  abnormality Other: None IMPRESSION: 1. No evidence of acute intracranial abnormality. Atrophy and probable mild chronic small-vessel white matter ischemic changes. 2. No static evidence of acute injury to the cervical spine. Moderate multilevel degenerative changes. Electronically Signed   By: Harmon Pier M.D.   On: 12/31/2020 17:18   CT Cervical Spine Wo Contrast  Result Date: 12/31/2020 CLINICAL DATA:  87 year old female with head and neck injury from fall today. Initial encounter. EXAM: CT HEAD WITHOUT CONTRAST CT CERVICAL SPINE WITHOUT CONTRAST TECHNIQUE: Multidetector CT imaging of the head and cervical spine was performed following the standard protocol without intravenous contrast. Multiplanar CT image reconstructions of the cervical spine were also generated. COMPARISON:  08/23/2004 cervical spine radiographs FINDINGS: CT HEAD FINDINGS Brain: No evidence of acute infarction, hemorrhage, hydrocephalus, extra-axial collection or mass lesion/mass effect. Atrophy, probable mild chronic small-vessel white matter ischemic changes and bilateral globus pallidus calcifications noted. Vascular: Carotid atherosclerotic calcifications are noted. Skull: Normal. Negative for fracture or focal lesion. Sinuses/Orbits: No acute abnormality. Mucosal thickening within the RIGHT maxillary sinus noted. Other: None CT CERVICAL SPINE FINDINGS Alignment: Normal. Skull base and vertebrae: No acute fracture. No primary bone lesion or focal pathologic process. Soft tissues and spinal canal:  No prevertebral fluid or swelling. No visible canal hematoma. Disc levels: Moderate multilevel degenerative disc disease, spondylosis and facet arthropathy noted contributing to mild central spinal and mild to moderate bony foraminal narrowing at multiple levels. Upper chest: No acute abnormality Other: None IMPRESSION: 1. No evidence of acute intracranial abnormality. Atrophy and probable mild chronic small-vessel white matter ischemic changes.  2. No static evidence of acute injury to the cervical spine. Moderate multilevel degenerative changes. Electronically Signed   By: Harmon Pier M.D.   On: 12/31/2020 17:18   CT Knee Right Wo Contrast  Result Date: 12/31/2020 CLINICAL DATA:  Limping, acute, right knee pain EXAM: CT OF THE radiograph 12/31/2020 KNEE WITHOUT CONTRAST TECHNIQUE: Multidetector CT imaging of the RIGHT knee was performed according to the standard protocol. Multiplanar CT image reconstructions were also generated. COMPARISON:  Radiograph 12/31/2020 FINDINGS: Bones/Joint/Cartilage Moderate to severe tricompartmental degenerative changes including essentially bone-on-bone contact in the significantly narrowed medial femorotibial and patellofemoral compartments with exuberant periarticular spurring. More moderate change seen in the partly narrowed lateral femorotibial compartment as well. Corticated joint body versus fabella posteriorly. Subcortical sclerosis involving the medial tibial plateau concerning for an underlying osteochondral lesion. Conspicuous linear lucency concerning for nondisplaced fracture seen extending through the posteromedial corner on sagittal imaging and into the exuberant posterior osteophytes (10/75). Large knee joint effusion with evidence of synovitis. Partially decompressing into a Baker's cyst posteriorly demonstrating a layering fluid-fluid level. Ligaments Suboptimally assessed by CT. Muscles and Tendons No clear musculotendinous injury.  No retracted or torn tendons. Soft tissues Soft tissue swelling of the knee is most pronounced anteriorly. Joint effusion and bearing Baker's cyst, as above. Extensive atherosclerotic calcification. Vasculature incompletely assessed. IMPRESSION: Diffuse bony demineralization, may limit detection of subtle injury. Features concerning for a nondisplaced fracture lucency through the posteromedial corner of the tibial plateau. Additional subcortical sclerotic changes in a  configuration suspicious for osteochondral lesion/spontaneous osteonecrosis of the medial tibial plateau weight-bearing articular surface as well. Large effusion partially decompressing into a Baker's cyst with evidence of synovitis and layering fluid fluid levels which can reflect hemarthrosis in the setting of fracture. Moderate to severe tricompartmental degenerative changes most pronounced in the medial femorotibial and patellofemoral compartments where there is essentially complete effacement of the joint space. Atherosclerosis. Electronically Signed   By: Kreg Shropshire M.D.   On: 12/31/2020 19:21   DG Knee Complete 4 Views Right  Result Date: 12/31/2020 CLINICAL DATA:  87 year old female with acute RIGHT knee pain following fall today. Initial encounter. EXAM: RIGHT KNEE - COMPLETE 4+ VIEW COMPARISON:  12/08/2016 FINDINGS: No acute fracture or dislocation. Severe tricompartmental joint space narrowing and osteophytosis noted. No joint effusion is present. IMPRESSION: 1. No acute abnormality. 2. Severe tricompartmental degenerative changes. Electronically Signed   By: Harmon Pier M.D.   On: 12/31/2020 15:06   DG HIP UNILAT WITH PELVIS 2-3 VIEWS LEFT  Result Date: 12/31/2020 CLINICAL DATA:  Larey Seat yesterday.  Pain. EXAM: DG HIP (WITH OR WITHOUT PELVIS) 2-3V LEFT COMPARISON:  None. FINDINGS: There is no acute fracture or subluxation. Femoral artery calcification and vascular stents noted. Moderate stool burden in the rectosigmoid colon. There is sclerosis of the symphysis pubis. IMPRESSION: No evidence for acute  abnormality. Electronically Signed   By: Norva Pavlov M.D.   On: 12/31/2020 17:00   DG HIP UNILAT WITH PELVIS 2-3 VIEWS RIGHT  Result Date: 12/31/2020 CLINICAL DATA:  Larey Seat yesterday.  RIGHT hip pain. EXAM: DG HIP (WITH OR WITHOUT PELVIS) 2-3V RIGHT COMPARISON:  Pelvis 12/31/2020 FINDINGS: There is no acute fracture or subluxation. Note is made of dense atherosclerotic calcification of the  femoral artery. There is sclerosis of the symphysis pubis. IMPRESSION: No evidence for acute  abnormality. Electronically Signed   By: Norva Pavlov M.D.   On: 12/31/2020 17:01    Anti-infectives: Anti-infectives (From admission, onward)    None        Assessment/Plan Fall Left rib fractures (7-8) - dedicated AP CXR today. Pulm toilet. IS Right wrist fracture - Per Ortho/Hand. Removable Velcro wrist splint. PT/OT Right tibial plateau fracture - Per Ortho. TDWB for transfers only. Otherwise NWB. KI. Follow up in 2 weeks with Dr Ave Filter for repeat xrays. PT/OT H/o NSTEMI H/o PE - restart Eliquis  Hx Chronic diastolic heart failure  Hx HTN - home meds  Hx HLD - home meds  Hx Hypothroidism - home synthroid BLE edema - home lasix  FEN - Soft, SLIV - repeat labs  VTE - SCDs, restart home Eliquis. On prophylactic lovenox currently ID - None Foley - None. Voiding.  Dispo: PT/OT   LOS: 2 days    Jacinto Halim , [facility] 01/02/2021, 8:29 AM Please see Amion for pager number during day hours 7:00am-4:30pm

## 2021-01-02 NOTE — Evaluation (Signed)
Physical Therapy Evaluation Patient Details Name: Lynn Brown MRN: [id number] DOB: [birth year] Today's Date: 01/02/2021   History of Present Illness  The pt is an 87 yo female presenting 8/6 after sustaining a fall down 2 stairs at home. Work up revealed R tibial plateau fx, R wrist fx, L rib 7-8 fx. Plan for non-op management of all injuries. PMH includes: HTN, arthritis, HLD, PVD status post left SFA and popliteal stenting 04/2020 on aspirin Plavix, and admission for PE 08/09/20.   Clinical Impression  Pt in bed upon arrival of PT, agreeable to evaluation at this time. Prior to admission the pt was mobilizing with intermittent use of cane for longer/community distances, but was mostly independent, living with family in a home where the pt's bedroom was on the 3rd floor. The pt now presents with limitations in functional mobility, strength, power, dynamic stability, and endurance due to above dx and resulting pain, and will continue to benefit from skilled PT to address these deficits. The pt was eager and motivated to mobilize, but requires mod-maxA of 2 for all bed mobility as well as OOB mobility at this time due to deficits and WB restrictions. The pt was unable to generate any form of pivot/steps at this time, and will benefit from continued skilled PT acutely as well as following d/c to facilitate functional return and achieve pt's goal of return to independence.      Follow Up Recommendations CIR    Equipment Recommendations  Wheelchair (measurements PT);Wheelchair cushion (measurements PT) (R platform RW. WC with elevating legrests)    Recommendations for Other Services       Precautions / Restrictions Precautions Precautions: Fall Precaution Comments: R UE & LE fxs, L 7-8 rib fxs Required Braces or Orthoses: Knee Immobilizer - Right;Splint/Cast Knee Immobilizer - Right: On at all times Splint/Cast: Removable Velcro wrist splint, on at all times Restrictions Weight Bearing  Restrictions: Yes RUE Weight Bearing: Partial weight bearing RUE Partial Weight Bearing Percentage or Pounds: 50% with splint on RLE Weight Bearing: Non weight bearing Other Position/Activity Restrictions: verbal orders for TDWB for transfers only      Mobility  Bed Mobility Overal bed mobility: Needs Assistance Bed Mobility: Supine to Sit     Supine to sit: Mod assist;+2 for physical assistance;+2 for safety/equipment     General bed mobility comments: Pt requried physical assist to negotiate RLE towards the EOB and vc throughout for all sequencing and problem solving. Pt required increased time and the [facility] significantly elevated. She was able to use her LUE to elevate her turnk into sitting however needed mod A +2 to adjust her hips closer to the EOB with use of the chuck pad.    Transfers Overall transfer level: Needs assistance Equipment used: Right platform walker;2 person hand held assist Transfers: Sit to/from Stand Sit to Stand: Max assist;+2 safety/equipment;+2 physical assistance;From elevated surface         General transfer comment: 2x sit<>stand this session. 1st attempt was with 2 person hand held with max A for boosting and to maintain WB status. 2nd attmept with R platform walker and max A +2. Pt was able to stand up taller with use of platform however was unable to progress to pivoting. She benefits from visual and verbal cues.  Ambulation/Gait             General Gait Details: pt unable to generate any steps while maintaining WB status despite maxA of 2  Balance Overall balance assessment: Needs assistance;History of Falls Sitting-balance support: Feet supported Sitting balance-Leahy Scale: Fair     Standing balance support: Bilateral upper extremity supported Standing balance-Leahy Scale: Poor Standing balance comment: dependent on RW and maxA of 2                             Pertinent Vitals/Pain Pain Assessment:  Faces Faces Pain Scale: Hurts little more Pain Location: L ribs > R extremeties Pain Descriptors / Indicators: Discomfort;Grimacing Pain Intervention(s): Limited activity within patient's tolerance;Monitored during session;Repositioned;Premedicated before session    Home Living Family/patient expects to be discharged to:: Private residence Living Arrangements: Children Available Help at Discharge: Family;Available 24 hours/day Type of Home: House Home Access: Level entry     Home Layout: Multi-level Home Equipment: Shower seat;Cane - single point;Hand held shower head;Adaptive equipment;Walker - 4 wheels Additional Comments: Pt's bedroom is on the 3rd level; per daughter they are planning to move recliner and bed onto the 1st level for pt to stay until she is able to navigate stairs. Tub shower on main level. Good family support.    Prior Function Level of Independence: Needs assistance   Gait / Transfers Assistance Needed: ambulates without AD in house and limited community distances wtih cane  ADL's / Homemaking Assistance Needed: Indep with BADLs, daughter assists with IADLs, does not drive        Hand Dominance   Dominant Hand: Right    Extremity/Trunk Assessment   Upper Extremity Assessment Upper Extremity Assessment: Defer to OT evaluation    Lower Extremity Assessment Lower Extremity Assessment: Generalized weakness;RLE deficits/detail RLE Deficits / Details: in KI, pt with limited ROM at ankle (PF/DF) but reports full sensation. pt unable to complete SLR or hip abd/add in supine without physical assist RLE: Unable to fully assess due to immobilization RLE Sensation: WNL    Cervical / Trunk Assessment Cervical / Trunk Assessment: Kyphotic  Communication   Communication: HOH  Cognition Arousal/Alertness: Awake/alert Behavior During Therapy: WFL for tasks assessed/performed Overall Cognitive Status: Within Functional Limits for tasks assessed                                  General Comments: incrased time for processing and initiation      General Comments General comments (skin integrity, edema, etc.): SpO2 at 86% upon arrival while resting in bed, declined to 82% while sitting EOB and unable to recover on RA. Pt in no distress and reporting no SOB despite low reading. Placed 2L nasal canula and pt was able to recover to 91% given incrased time and deep breathing cues. Pt left sitting in chair with 2L nasal canula. RN aware.    Exercises     Assessment/Plan    PT Assessment Patient needs continued PT services  PT Problem List Decreased strength;Decreased range of motion;Decreased activity tolerance;Decreased balance;Decreased mobility;Decreased coordination;Pain       PT Treatment Interventions DME instruction;Gait training;Stair training;Functional mobility training;Therapeutic activities;Therapeutic exercise;Balance training;Patient/family education    PT Goals (Current goals can be found in the Care Plan section)  Acute Rehab PT Goals Patient Stated Goal: home PT Goal Formulation: With patient Time For Goal Achievement: 01/16/21 Potential to Achieve Goals: Good    Frequency Min 4X/week   Barriers to discharge        Co-evaluation PT/OT/SLP Co-Evaluation/Treatment: Yes Reason for Co-Treatment: Complexity of the patient's  impairments (multi-system involvement);For patient/therapist safety;To address functional/ADL transfers PT goals addressed during session: Mobility/safety with mobility;Balance;Proper use of DME OT goals addressed during session: ADL's and self-care;Proper use of Adaptive equipment and DME       AM-PAC PT "6 Clicks" Mobility  Outcome Measure Help needed turning from your back to your side while in a flat bed without using bedrails?: A Lot Help needed moving from lying on your back to sitting on the side of a flat bed without using bedrails?: A Lot Help needed moving to and from a bed to a chair  (including a wheelchair)?: Total Help needed standing up from a chair using your arms (e.g., wheelchair or bedside chair)?: A Lot Help needed to walk in hospital room?: Total Help needed climbing 3-5 steps with a railing? : Total 6 Click Score: 9    End of Session Equipment Utilized During Treatment: Gait belt;Oxygen Activity Tolerance: Patient limited by fatigue Patient left: in chair;with call bell/phone within reach;with chair alarm set;with family/visitor present Nurse Communication: Mobility status PT Visit Diagnosis: Unsteadiness on feet (R26.81);Other abnormalities of gait and mobility (R26.89);Muscle weakness (generalized) (M62.81);Pain Pain - Right/Left: Right Pain - part of body: Arm;Leg    Time: 0930-1016 PT Time Calculation (min) (ACUTE ONLY): 46 min   Charges:   PT Evaluation $PT Eval Moderate Complexity: 1 Mod          Nasser Ku [place] Hun, PT, DPT   Acute Rehabilitation Department Pager #: [phone]  Gaetana Michaelis 01/02/2021, 11:07 AM

## 2021-01-02 NOTE — Progress Notes (Addendum)
Occupational Therapy Evaluation Patient Details Name: Lynn Brown MRN: [id number] DOB: 16-Mar-1932 Today's Date: 01/02/2021    History of Present Illness 87 y.o. female presented with right-sided chest pain and shortness of breath which has been going on for about a month gradually getting worse. 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 Work up revealed R tibial plateau fx, R wrist fx, L rib 7-8 fx. Plan for non-op management of all injuries. PMH includes: HTN, arthritis, HLD, PVD status post left SFA and popliteal stenting 04/2020 on aspirin Plavix, and admission for PE 08/09/20.    Follow Up Recommendations  CIR    Equipment Recommendations  3 in 1 bedside commode (Pt is unsure if they have a wc at home - family is looking into it. If not, pt will need a wc with elevating leg rests. & R platform walker.)    Recommendations for Other Services Rehab consult     Precautions / Restrictions Precautions Precautions: Fall Precaution Comments: R UE & LE fxs, L 7-8 rib fxs Required Braces or Orthoses: Knee Immobilizer - Right;Splint/Cast Knee Immobilizer - Right: On at all times Splint/Cast: Removable Velcro wrist splint, on at all times Restrictions Weight Bearing Restrictions: Yes RUE Weight Bearing: Partial weight bearing RUE Partial Weight Bearing Percentage or Pounds: 50% with splint on RLE Weight Bearing: Non weight bearing (TDWB for transfers only)      Mobility Bed Mobility Overal bed mobility: Needs Assistance Bed Mobility: Supine to Sit     Supine to sit: Mod assist;+2 for physical assistance;+2 for safety/equipment     General bed mobility comments: Pt requried physical assist to negotiate RLE  towards the EOB and vc throughout for all sequencing and problem solving. Pt required increased time and the [facility] significantly elevated. She was able to use her LUE to elevate her turnk into sitting however needed mod A +2 to adjust her hips closer to the EOB with use of the chuck pad.    Transfers Overall transfer level: Needs assistance Equipment used: Right platform walker;2 person hand held assist Transfers: Sit to/from Stand Sit to Stand: Max assist;+2 safety/equipment;+2 physical assistance;From elevated surface         General transfer comment: 2x sit<>stand this session. 1st attempt was with 2 person hand held with max A for boosting and to maintain WB status. 2nd attmept with R platform walker and max A +2. Pt was able to stand up taller with use of platform however was unable to progress to pivoting. She benefits from visual and verbal cues.    Balance Overall balance assessment: Needs assistance;History of Falls Sitting-balance support: Feet supported Sitting balance-Leahy Scale: Fair     Standing balance support: Bilateral upper extremity supported Standing balance-Leahy Scale: Poor                             ADL either performed or assessed with clinical judgement   ADL Overall ADL's : Needs assistance/impaired Eating/Feeding: Set up;Sitting Eating/Feeding Details (indicate cue type and reason): assist for packages Grooming: Wash/dry hands;Wash/dry face;Oral care;Applying deodorant;Brushing hair;Minimal assistance;Sitting   Upper Body Bathing: Minimal assistance;Sitting   Lower Body Bathing: Maximal assistance;+2 for  physical assistance;+2 for safety/equipment;Sit to/from stand   Upper Body Dressing : Minimal assistance;Sitting   Lower Body Dressing: Maximal assistance;+2 for physical assistance;+2 for safety/equipment;Sit to/from stand   Toilet Transfer: Maximal assistance;+2 for physical assistance;+2 for safety/equipment;Squat-pivot;BSC;RW    Toileting- Clothing Manipulation and Hygiene: Maximal assistance;+2 for physical assistance;+2 for safety/equipment;Sit to/from stand       Functional mobility during ADLs: Maximal assistance;+2 for physical assistance;+2 for safety/equipment (sit<>stand only this session) General ADL Comments: pt requires increased assist levels due to multiple fractures and WB status.     Vision Baseline Vision/History: Wears glasses Wears Glasses: At all times Patient Visual Report: No change from baseline Vision Assessment?: No apparent visual deficits            Pertinent Vitals/Pain Pain Assessment: Faces Faces Pain Scale: Hurts little more Pain Location: L ribs > R extremeties Pain Descriptors / Indicators: Discomfort;Grimacing Pain Intervention(s): Limited activity within patient's tolerance;Monitored during session     Hand Dominance Right   Extremity/Trunk Assessment Upper Extremity Assessment Upper Extremity Assessment: Generalized weakness (limited ROM of bilat shoulders; per report pt hurt R shoulder picking up a [place] 3 years ago. L shoulder ROM limited by exacerbation of L rib pain)   Lower Extremity Assessment Lower Extremity Assessment: Defer to PT evaluation   Cervical / Trunk Assessment Cervical / Trunk Assessment: Kyphotic   Communication Communication Communication: HOH   Cognition Arousal/Alertness: Awake/alert Behavior During Therapy: WFL for tasks assessed/performed Overall Cognitive Status: Within Functional Limits for tasks assessed           General Comments: incrased time for processing and initiation   General Comments  SpO2 at 86% upon arrival while resting in bed, declined to 82% while sitting EOB and unable to recover on RA. Placed 2L nasal canula and pt was able to recover to 91% given incrased time and deep breathing cues. Pt left sitting in chair with 2L nasal canula. RN aware.     Home Living Family/patient expects to be discharged to::  Private residence Living Arrangements: Children Available Help at Discharge: Family;Available 24 hours/day Type of Home: House Home Access: Level entry     Home Layout: Multi-level Alternate Level Stairs-Number of Steps: 10 Alternate Level Stairs-Rails: Right Bathroom Shower/Tub: Chief Strategy Officer: Handicapped height Bathroom Accessibility: Yes How Accessible: Accessible via walker Home Equipment: Shower seat;Cane - single point;Hand held shower head;Adaptive equipment;Walker - 4 wheels Adaptive Equipment: Reacher Additional Comments: Pt's bedroom is on the 3rd level; per daughter they are planning to move recliner and bed onto the 1st level for pt to stay until she is able to navigate stairs. Tub shower on main level. Good family support.      Prior Functioning/Environment Level of Independence: Needs assistance  Gait / Transfers Assistance Needed: ambulates without AD in house and limited community distances wtih cane ADL's / Homemaking Assistance Needed: Indep with BADLs, daughter assists with IADLs, does not drive Communication / Swallowing Assistance Needed: WFL          OT Problem List: Decreased strength;Decreased range of motion;Decreased activity tolerance;Impaired balance (sitting and/or standing);Decreased safety awareness;Decreased knowledge of use of DME or AE;Decreased knowledge of precautions;Impaired UE functional use;Pain      OT Treatment/Interventions: Self-care/ADL training;Therapeutic exercise;DME and/or AE instruction;Therapeutic activities;Cognitive remediation/compensation;Patient/family education;Balance training    OT Goals(Current goals can be found in the care plan section) Acute Rehab OT Goals Patient Stated Goal: home OT Goal Formulation: With patient Time For Goal Achievement: 01/16/21 Potential to Achieve  Goals: Fair ADL Goals Pt Will Perform Upper Body Bathing: with set-up;sitting Pt Will Perform Lower Body Bathing: with  set-up;sitting/lateral leans Pt Will Perform Upper Body Dressing: with modified independence;sitting Pt Will Perform Lower Body Dressing: with min assist;sit to/from stand Pt Will Transfer to Toilet: with min assist;stand pivot transfer;bedside commode Pt Will Perform Toileting - Clothing Manipulation and hygiene: with modified independence;sitting/lateral leans  OT Frequency: Min 2X/week       Co-evaluation PT/OT/SLP Co-Evaluation/Treatment: Yes Reason for Co-Treatment: Complexity of the patient's impairments (multi-system involvement);For patient/therapist safety;To address functional/ADL transfers   OT goals addressed during session: ADL's and self-care;Proper use of Adaptive equipment and DME      AM-PAC OT "6 Clicks" Daily Activity     Outcome Measure Help from another person eating meals?: A Little Help from another person taking care of personal grooming?: A Little Help from another person toileting, which includes using toliet, bedpan, or urinal?: A Lot Help from another person bathing (including washing, rinsing, drying)?: A Lot Help from another person to put on and taking off regular upper body clothing?: A Little Help from another person to put on and taking off regular lower body clothing?: A Lot 6 Click Score: 15   End of Session Equipment Utilized During Treatment: Gait belt;Rolling walker;Oxygen Nurse Communication: Mobility status;Precautions;Weight bearing status (SpO2)  Activity Tolerance: Patient tolerated treatment well Patient left: in chair;with call bell/phone within reach;with chair alarm set;with family/visitor present  OT Visit Diagnosis: Unsteadiness on feet (R26.81);Muscle weakness (generalized) (M62.81);Repeated falls (R29.6);Pain                Time: 9211-9417 OT Time Calculation (min): 41 min Charges:  OT General Charges $OT Visit: 1 Visit OT Evaluation $OT Eval Moderate Complexity: 1 Mod OT Treatments $Therapeutic Activity: 8-22  mins  Nechama Escutia A Keymari Sato 01/02/2021, 10:45 AM

## 2021-01-02 NOTE — Progress Notes (Signed)
Inpatient Rehab Admissions Coordinator Note:   Per therapy recommendations, pt was screened for CIR candidacy by Micalah Cabezas, MS CCC-SLP. At this time, Pt. Appears to have functional decline and is a good candidate for CIR. Will place order for rehab consult per protocol.  Please contact me with questions.   Teondra Newburg, MS, CCC-SLP Rehab Admissions Coordinator  [phone] (celll) [phone] (office)  

## 2021-01-02 NOTE — Discharge Instructions (Addendum)
ORTHOPEDIC INSTRUCTIONS Touch down weightbearing for transfers ONLY. Otherwise, do not bear weight on the right leg.  Wear knee immobilizer at all times to prevent the knee from bending.  Follow up in 2 weeks with Dr Ave Filter for repeat xrays of the right knee.   RIB FRACTURES  HOME INSTRUCTIONS   PAIN CONTROL:  Pain is best controlled by a usual combination of three different methods TOGETHER:  Ice/Heat Over the counter pain medication Prescription pain medication You may experience some swelling and bruising in area of broken ribs. Ice packs or heating pads (30-60 minutes up to 6 times a day) will help. Use ice for the first few days to help decrease swelling and bruising, then switch to heat to help relax tight/sore spots and speed recovery. Some people prefer to use ice alone, heat alone, alternating between ice & heat. Experiment to what works for you. Swelling and bruising can take several weeks to resolve.  It is helpful to take an over-the-counter pain medication regularly for the first few weeks. Choose one of the following that works best for you:  Naproxen (Aleve, etc) Two 220mg  tabs twice a day Ibuprofen (Advil, etc) Three 200mg  tabs four times a day (every meal & bedtime) Acetaminophen (Tylenol, etc) 500-650mg  four times a day (every meal & bedtime) A prescription for pain medication (such as oxycodone, hydrocodone, etc) may be given to you upon discharge. Take your pain medication as prescribed.  If you are having problems/concerns with the prescription medicine (does not control pain, nausea, vomiting, rash, itching, etc), please call [phone] to see if we need to switch you to a different pain medicine that will work better for you and/or control your side effect better. If you need a refill on your pain medication, please contact your pharmacy. They will contact our office to request authorization. Prescriptions will not be filled after 5 pm or on week-ends. Avoid  getting constipated. When taking pain medications, it is common to experience some constipation. Increasing fluid intake and taking a fiber supplement (such as Metamucil, Citrucel, FiberCon, MiraLax, etc) 1-2 times a day regularly will usually help prevent this problem from occurring. A mild laxative (prune juice, Milk of Magnesia, MiraLax, etc) should be taken according to package directions if there are no bowel movements after 48 hours.  Watch out for diarrhea. If you have many loose bowel movements, simplify your diet to bland foods & liquids for a few days. Stop any stool softeners and decrease your fiber supplement. Switching to mild anti-diarrheal medications (Kayopectate, Pepto Bismol) can help. If this worsens or does not improve, please call us. FOLLOW UP  If a follow up appointment is needed one will be scheduled for you. If none is needed with our trauma team, please follow up with your primary care provider within 2-3 weeks from discharge. Please call [facility] at [phone] if you have any questions about follow up.  If you have any orthopedic or other injuries you will need to follow up as outlined in your follow up instructions.   WHEN TO CALL us [phone]:  Poor pain control Reactions / problems with new medications (rash/itching, nausea, etc)  Fever over 101.5 F (38.5 C) Worsening swelling or bruising Worsening pain, productive cough, difficulty breathing or any other concerning symptoms  The clinic staff is available to answer your questions during regular business hours (8:30am-5pm). Please don't hesitate to call and ask to speak to one of our nurses for clinical concerns.  If  you have a medical emergency, go to the nearest emergency room or call 911.  A surgeon from [facility] is always on call at the [facility], [place]  [address], [place], [place] [address] ?  MAIN: [phone] ? TOLL FREE: [phone] ?  FAX  [phone]  [email or web address]      Information on Rib Fractures  A rib fracture is a break or crack in one of the bones of the ribs. The ribs are long, curved bones that wrap around your chest and attach to your spine and your breastbone. The ribs protect your heart, lungs, and other organs in the chest. A broken or cracked rib is often painful but is not usually serious. Most rib fractures heal on their own over time. However, rib fractures can be more serious if multiple ribs are broken or if broken ribs move out of place and push against other structures or organs. What are the causes? This condition is caused by: Repetitive movements with high force, such as pitching a baseball or having severe coughing spells. A direct blow to the chest, such as a sports injury, a car accident, or a fall. Cancer that has spread to the bones, which can weaken bones and cause them to break. What are the signs or symptoms? Symptoms of this condition include: Pain when you breathe in or cough. Pain when someone presses on the injured area. Feeling short of breath. How is this diagnosed? This condition is diagnosed with a physical exam and medical history. Imaging tests may also be done, such as: Chest X-ray. CT scan. MRI. Bone scan. Chest ultrasound. How is this treated? Treatment for this condition depends on the severity of the fracture. Most rib fractures usually heal on their own in 1-3 months. Sometimes healing takes longer if there is a cough that does not stop or if there are other activities that make the injury worse (aggravating factors). While you heal, you will be given medicines to control the pain. You will also be taught deep breathing exercises. Severe injuries may require hospitalization or surgery. Follow these instructions at home: Managing pain, stiffness, and swelling If directed, apply ice to the injured area. Put ice in a plastic bag. Place a towel between  your skin and the bag. Leave the ice on for 20 minutes, 2-3 times a day. Take over-the-counter and prescription medicines only as told by your health care provider. Activity Avoid a lot of activity and any activities or movements that cause pain. Be careful during activities and avoid bumping the injured rib. Slowly increase your activity as told by your health care provider. General instructions Do deep breathing exercises as told by your health care provider. This helps prevent pneumonia, which is a common complication of a broken rib. Your health care provider may instruct you to: Take deep breaths several times a day. Try to cough several times a day, holding a pillow against the injured area. Use a device called incentive spirometer to practice deep breathing several times a day. Drink enough fluid to keep your urine pale yellow. Do not wear a rib belt or binder. These restrict breathing, which can lead to pneumonia. Keep all follow-up visits as told by your health care provider. This is important. Contact a health care provider if: You have a fever. Get help right away if: You have difficulty breathing or you are short of breath. You develop a cough that does not stop, or  you cough up thick or bloody sputum. You have nausea, vomiting, or pain in your abdomen. Your pain gets worse and medicine does not help. Summary A rib fracture is a break or crack in one of the bones of the ribs. A broken or cracked rib is often painful but is not usually serious. Most rib fractures heal on their own over time. Treatment for this condition depends on the severity of the fracture. Avoid a lot of activity and any activities or movements that cause pain. This information is not intended to replace advice given to you by your health care provider. Make sure you discuss any questions you have with your health care provider. Document Released: 05/14/2005 Document Revised: 08/13/2016 Document Reviewed:  08/13/2016 Elsevier Interactive Patient Education  2019 [employer].

## 2021-01-02 NOTE — Progress Notes (Signed)
PATIENT ID: Lynn Brown  MRN: [id number]  DOB/AGE:  [birth year] / 87 y.o.   Subjective: Pain is mild. Patient reports that she is feeling much better this morning. No c/o chest pain or SOB. She is eager to try to get up with PT today.    Objective: Vital signs in last 24 hours: Temp:  [97.6 F (36.4 C)-98.2 F (36.8 C)] 98.2 F (36.8 C) (08/08 0431) Pulse Rate:  [57-70] 70 (08/08 0431) Resp:  [15-16] 15 (08/08 0431) BP: (111-137)/(71-85) 135/85 (08/08 0431) SpO2:  [92 %-97 %] 92 % (08/08 0431)  Intake/Output from previous day: 08/07 0701 - 08/08 0700 In: 432.8 [I.V.:432.8] Out: 1750 [Urine:1750] Intake/Output this shift: No intake/output data recorded.  Recent Labs    12/31/20 1359 01/01/21 0549  HGB 13.0 11.8*   Recent Labs    12/31/20 1359 01/01/21 0549  WBC 5.8 4.9  RBC 4.26 3.76*  HCT 41.1 36.2  PLT 173 156   Recent Labs    01/01/21 0549 01/01/21 1104  NA 133* 136  K 6.3* 4.4  CL 105 106  CO2 25 21*  BUN 19 19  CREATININE 0.99  1.03* 0.90  GLUCOSE 476* 123*  CALCIUM 7.9* 8.9     Physical Exam: Right knee shows moderate soft tissue swelling and slight effusion. No open wound or abrasion. Tenderness to palpation along the medial and lateral joint lines. ROM not attempted due to known fracture. Sensation to light touch intact in BLE. Able to dorsiflex and plantar flex the ankles. Distal pulses 2+. Compartment soft.    Assessment/Plan:   Right knee, tibial plateau fracture   Advance diet Up with therapy, touch down weightbearing for transfers only. Otherwise NWB due to concern for ability to consistently use walker correctly and safely given right UE injury.  VTE prophylaxis: pharmacologic prophylaxis (with any of the following: Lovenox)  Patient to work with PT today with above mentioned restrictions. Plan for follow up in 2 weeks with Dr Ave Filter for repeat xrays. Patient to remain in knee immobilizer. DC disposition per trauma med.    Kindel Rochefort L.  [place], PA-C 01/02/2021, 7:33 AM

## 2021-01-02 NOTE — Progress Notes (Signed)
ANTICOAGULATION CONSULT NOTE - Initial Consult  Pharmacy Consult for apixaban Indication:  hx PE  No Known Allergies  Patient Measurements: Height: 5\' 1"  (154.9 cm) Weight: 85.3 kg (188 lb) IBW/kg (Calculated) : 47.8  Vital Signs: Temp: 97.8 F (36.6 C) (08/08 0810) Temp Source: Oral (08/08 0810) BP: 128/86 (08/08 0810) Pulse Rate: 63 (08/08 0810)  Labs: Recent Labs    12/31/20 1359 01/01/21 0549 01/01/21 1104  HGB 13.0 11.8*  --   HCT 41.1 36.2  --   PLT 173 156  --   CREATININE 1.04* 0.99  1.03* 0.90    Estimated Creatinine Clearance: 43.7 mL/min (by C-G formula based on SCr of 0.9 mg/dL).   Medical History: Past Medical History:  Diagnosis Date   Allergic rhinitis    Anxiety    Bunion    PES PLANUS   Hearing loss    HEARING AIDS, BUT DOES NOT WEAR THEM CONSISTENTLY.    Hypercholesteremia    Hypertension    Hypothyroid    Leaky heart valve    DR. HARWANI   Mild obesity    Osteoarthritis of knees, bilateral    Osteoporosis 12/2017   T score -3.3   Venous insufficiency     Medications:  Medications Prior to Admission  Medication Sig Dispense Refill Last Dose   Acetaminophen 500 MG capsule Take 500 mg by mouth every 6 (six) hours as needed for pain.   12/31/2020 at am   apixaban (ELIQUIS) 5 MG TABS tablet Take 1 tablet (5 mg total) by mouth 2 (two) times daily. 60 tablet 3 12/31/2020 at 8am   ascorbic acid (VITAMIN C) 500 MG tablet Take 500 mg by mouth in the morning and at bedtime.   Past Week   aspirin EC 81 MG tablet Take 1 tablet (81 mg total) by mouth daily. Swallow whole. 90 tablet 3 12/31/2020 at am   Calcium Citrate-Vitamin D 315-250 MG-UNIT TABS Take 1 tablet by mouth daily.   couple weeks ago   cetirizine (ZYRTEC) 10 MG tablet Take 10 mg by mouth daily as needed (allergies.).    unknown   clorazepate (TRANXENE) 7.5 MG tablet Take 7.5 mg by mouth daily as needed for anxiety.    12/28/2020   Coenzyme Q10 (COQ10 PO) Take 1 capsule by mouth daily at 2  PM.   12/30/2020   furosemide (LASIX) 20 MG tablet Take 20 mg by mouth every morning.   12/30/2020 at am   levothyroxine (SYNTHROID) 112 MCG tablet Take 112 mcg by mouth every morning.   12/31/2020 at am   magnesium oxide (MAG-OX) 400 MG tablet Take 400 mg by mouth every evening.    12/30/2020 at pm   metoprolol tartrate (LOPRESSOR) 25 MG tablet Take 0.5 tablets (12.5 mg total) by mouth 2 (two) times daily. 60 tablet 0 12/31/2020 at 8am   nitroGLYCERIN (NITROSTAT) 0.4 MG SL tablet Place 1 tablet (0.4 mg total) under the tongue every 5 (five) minutes as needed for chest pain. 30 tablet 0 unknown   rosuvastatin (CRESTOR) 20 MG tablet Take 20 mg by mouth every evening.    12/30/2020 at pm   VITAMIN D PO Take 1 tablet by mouth daily at 2 PM.   12/30/2020   spironolactone (ALDACTONE) 25 MG tablet TAKE 1 TABLET BY MOUTH  DAILY (Patient not taking: No sig reported) 90 tablet 3 Not Taking    Assessment: 64 yof admitted 8/6 after a fall at home in which she sustained multiple injuries. She is  on apixaban PTA for hx PE which was initially held in case surgery was needed. She was placed on enoxaparin for VTE prophylaxis. Pharmacy consulted to resume apixaban since surgery not needed at this time.  No bleeding noted, CBC is normal. Renal function is good and at baseline.  Plan:  D/C enoxaparin Apixaban 5 mg PO bid - begin tonight Pharmacy signing off but will continue to follow peripherally - please re-consult if needed  Thank you for involving pharmacy in this patient's care.  Loura Back, PharmD, BCPS Clinical Pharmacist Clinical phone for 01/02/2021 until 3p is [phone] 01/02/2021 8:42 AM  **Pharmacist phone directory can be found on [email or web address] listed under [facility]**

## 2021-01-03 MED ORDER — POLYETHYLENE GLYCOL 3350 17 G PO PACK
17.0000 g | PACK | Freq: Every day | ORAL | Status: DC
  Administered 2021-01-03 – 2021-01-05 (×3): 17 g via ORAL
  Filled 2021-01-03 (×3): qty 1

## 2021-01-03 NOTE — Progress Notes (Signed)
Trauma/Critical Care Follow Up Note  Subjective:    Overnight Issues:   Objective:  Vital signs for last 24 hours: Temp:  [97.8 F (36.6 C)-98.2 F (36.8 C)] 98 F (36.7 C) (08/08 2133) Pulse Rate:  [56-65] 60 (08/08 2133) Resp:  [15-17] 15 (08/08 2133) BP: (111-135)/(70-86) 129/70 (08/08 2133) SpO2:  [90 %-98 %] 98 % (08/08 2133)  Hemodynamic parameters for last 24 hours:    Intake/Output from previous day: 08/08 0701 - 08/09 0700 In: 240 [P.O.:240] Out: 2200 [Urine:2200]  Intake/Output this shift: No intake/output data recorded.  Vent settings for last 24 hours:    Physical Exam:  Gen: comfortable, no distress Neuro: non-focal exam HEENT: PERRL Neck: supple CV: RRR Pulm: unlabored breathing Abd: soft, NT GU: clear yellow urine Extr: wwp, no edema   Results for orders placed or performed during the hospital encounter of 12/31/20 (from the past 24 hour(s))  CBC     Status: None   Collection Time: 01/02/21 10:40 AM  Result Value Ref Range   WBC 5.0 4.0 - 10.5 K/uL   RBC 4.23 3.87 - 5.11 MIL/uL   Hemoglobin 13.0 12.0 - 15.0 g/dL   HCT 62.1 30.8 - 65.7 %   MCV 96.0 80.0 - 100.0 fL   MCH 30.7 26.0 - 34.0 pg   MCHC 32.0 30.0 - 36.0 g/dL   RDW 84.6 96.2 - 95.2 %   Platelets 156 150 - 400 K/uL   nRBC 0.0 0.0 - 0.2 %  Basic metabolic panel     Status: Abnormal   Collection Time: 01/02/21 10:40 AM  Result Value Ref Range   Sodium 136 135 - 145 mmol/L   Potassium 4.5 3.5 - 5.1 mmol/L   Chloride 104 98 - 111 mmol/L   CO2 24 22 - 32 mmol/L   Glucose, Bld 112 (H) 70 - 99 mg/dL   BUN 15 8 - 23 mg/dL   Creatinine, Ser 8.41 0.44 - 1.00 mg/dL   Calcium 9.0 8.9 - 32.4 mg/dL   GFR, Estimated >40 >10 mL/min   Anion gap 8 5 - 15    Assessment & Plan:  Present on Admission:  Fall from steps    LOS: 3 days   Additional comments:I reviewed the patient's new clinical lab test results.   and I reviewed the patients new imaging test results.    Fall  Left  rib fractures (7-8) - Pulm toilet. IS Right wrist fracture - Per Ortho/Hand. Removable Velcro wrist splint. PT/OT Right tibial plateau fracture - Per Ortho. TDWB for transfers only. Otherwise NWB. KI. Follow up in 2 weeks with Dr Ave Filter for repeat xrays. PT/OT H/o NSTEMI H/o PE - restarted Eliquis Hx Chronic diastolic heart failure  Hx HTN - home meds Hx HLD - home meds Hx Hypothroidism - home synthroid BLE edema - home lasix FEN - Soft, SLIV - repeat labs  VTE - SCDs, restarted home Eliquis ID - none Foley - None. Voiding. Dispo: PT/OT recs for CIR, but patient currently refusing, wants to go home with daughter and HHPT/OT. D/w daughter who is in agreement with this plan, also wants a hospital bed and plan 8/10.    Diamantina Monks, MD Trauma & General Surgery Please use [email or web address] to contact on call provider  01/03/2021  *Care during the described time interval was provided by me. I have reviewed this patient's available data, including medical history, events of note, physical examination and test results as part of my evaluation.

## 2021-01-03 NOTE — TOC CAGE-AID Note (Signed)
Transition of Care [facility]) - CAGE-AID Screening   Patient Details  Name: Lynn Brown MRN: [id number] Date of Birth: Oct 14, 1931  Transition of Care [facility]) CM/SW Contact:    Lossie Faes Tarpley-Carter, LCSWA Phone Number: 01/03/2021, 3:24 PM   Clinical Narrative:  Pt participated in Cage-Aid.  Pt stated she does not use substance or ETOH.  Pt was not offered resources, due to no usage of substance or ETOH.    [name] Tarpley-Carter, MSW, LCSW-A Pronouns:  She/Her/Hers [facility]itions of Care Clinical Social Worker Direct Number:  [phone] [email or web address]  CAGE-AID Screening:    Have You Ever Felt You Ought to Cut Down on Your Drinking or Drug Use?: No Have People Annoyed You By [employer] Your Drinking Or Drug Use?: No Have You Felt Bad Or Guilty About Your Drinking Or Drug Use?: No Have You Ever Had a Drink or Used Drugs First Thing In The Morning to Steady Your Nerves or to Get Rid of a Hangover?: No CAGE-AID Score: 0  Substance Abuse Education Offered: No

## 2021-01-03 NOTE — TOC Initial Note (Signed)
Transition of Care [facility]) - Initial/Assessment Note    Patient Details  Name: Lynn Brown MRN: [id number] Date of Birth: 13-Sep-1931  Transition of Care [facility]) CM/SW Contact:    Glennon Mac, RN Phone Number: 01/03/2021, 4:09 PM  Clinical Narrative:   The pt is an 87 yo female presenting 8/6 after sustaining a fall down 2 stairs at home. Work up revealed R tibial plateau fx, R wrist fx, L rib 7-8 fx. Plan for non-op management of all injuries.   Prior to admission, patient required assistance with ADLs; lives at home with adult daughter.  PT/OT recommending inpatient rehab, however patient and family prefer discharge home with home health services.  Daughter able to provide 24-hour assistance at discharge.  Referral to [facility] health for home health PT/OT, per pt/family choice;  referral to [facility] for hospital bed.  Patient states she has all other needed DME at home, including wheelchair, bedside commode, and rolling walker.  DME agency should contact patient's daughter for delivery information: Cimone Fahey, phone: [phone].           Expected Discharge Plan: Home w Home Health Services Barriers to Discharge: Continued Medical Work up   Patient Goals and CMS Choice Patient states their goals for this hospitalization and ongoing recovery are:: to go home CMS Medicare.gov Compare Post Acute Care list provided to:: Patient Represenative (must comment) (daughter) Choice offered to / list presented to : Adult Children  Expected Discharge Plan and Services Expected Discharge Plan: Home w Home Health Services   Discharge Planning Services: CM Consult Post Acute Care Choice: Home Health Living arrangements for the past 2 months: Single Family Home                 DME Arranged: Hospital bed DME Agency: [facility] Date DME Agency Contacted: 01/03/21 Time DME Agency Contacted: [phone] Representative spoke with at DME Agency: Velna Hatchet HH Arranged: PT, OT HH Agency: [facility] Date [facility] Agency Contacted: 01/03/21 Time HH Agency Contacted: 1608 Representative spoke with at [facility] Agency: Lorenza Chick  Prior Living Arrangements/Services Living arrangements for the past 2 months: Single Family Home Lives with:: Adult Children Patient language and need for interpreter reviewed:: Yes Do you feel safe going back to the place where you live?: Yes      Need for Family Participation in Patient Care: Yes (Comment) Care giver support system in place?: Yes (comment)   Criminal Activity/Legal Involvement Pertinent to Current Situation/Hospitalization: No - Comment as needed  Activities of Daily Living Home Assistive Devices/Equipment: Cane (specify quad or straight) (straight cane) ADL Screening (condition at time of admission) Patient's cognitive ability adequate to safely complete daily activities?: Yes Is the patient deaf or have difficulty hearing?: No Does the patient have difficulty seeing, even when wearing glasses/contacts?: No Does the patient have difficulty concentrating, remembering, or making decisions?: No Patient able to express need for assistance with ADLs?: Yes Does the patient have difficulty dressing or bathing?: Yes Independently performs ADLs?: Yes (appropriate for developmental age) Does the patient have difficulty walking or climbing stairs?: Yes Weakness of Legs: Right Weakness of Arms/Hands: Right                   Emotional Assessment Appearance:: Appears stated age Attitude/Demeanor/Rapport: Engaged Affect (typically observed): Accepting Orientation: : Oriented to Self, Oriented to Place, Oriented to  Time, Oriented to Situation      Admission diagnosis:  Fall [W19.XXXA] Fall from steps [W10.9XXA] Closed  fracture of multiple ribs of left side, initial encounter [S22.42XA] Nondisplaced fracture of triquetrum (cuneiform) bone, right wrist, initial encounter for closed fracture [S62.114A] Patient Active Problem List    Diagnosis Date Noted   Fall from steps 12/31/2020   History of pulmonary embolism 11/18/2020   Pulmonary emboli (HCC) 08/09/2020   SOB (shortness of breath)    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   Vitreomacular traction, right 04/14/2020   Macular retinoschisis, right 04/14/2020   Coronary aneurysm 06/26/2018   NSTEMI (non-ST elevated myocardial infarction) (HCC) 03/09/2018   Chronic diastolic heart failure (HCC) 09/25/2017   Hypothyroid    Essential hypertension    Hypercholesteremia    Osteopenia 04/27/2008   PCP:  Laurann Montana, MD Pharmacy:   [facility] [id number] - [place], [place] - [address] AT [facility] OF [address] & [address] [place]  [address] Phone: [phone] Fax: [phone]     Social Determinants of Health (SDOH) Interventions    Readmission Risk Interventions No flowsheet data found.  Quintella Baton, RN, BSN  Trauma/Neuro ICU Case Manager [phone]

## 2021-01-03 NOTE — Progress Notes (Signed)
Patient suffers from Left rib fractures (7-8), Right wrist fracture, Right tibial plateau fracture which impairs their ability to perform daily activities like bathing, dressing, feeding, grooming, and toileting in the home.  A cane, crutch, or walker will not resolve issue with performing activities of daily living. A wheelchair will allow patient to safely perform daily activities. Patient can safely propel the wheelchair in the home or has a caregiver who can provide assistance. Length of need 6 months . Accessories: elevating leg rests (ELRs), wheel locks, extensions and anti-tippers.   Patient suffers from Left rib fractures (7-8), Right wrist fracture, Right tibial plateau fracture which impairs their ability to perform daily activities like toileting and mobilizing in the home. He has difficulty getting in and out of bed. A hospital bed will allow patient to safely perform daily activities, and allow him to be positioned in ways not feasible with a normal bed.  Pain episodes frequently require immediate changes in body position which cannot be achieved with a normal bed.    Lynn Brown , [facility] Surgery 01/03/2021, 8:48 AM Please see Amion for pager number during day hours 7:00am-4:30pm

## 2021-01-03 NOTE — Progress Notes (Signed)
Inpatient Rehabilitation Admissions Coordinator   I met with patient at bedside. She states she and her daughter prefer direct d/c home. Can set up bedroom on first level with level entry. We will sign off at this time.  Danne Baxter, RN, MSN Rehab Admissions Coordinator [phone] 01/03/2021 12:11 PM

## 2021-01-03 NOTE — Progress Notes (Addendum)
Physical Therapy Treatment Patient Details Name: Lynn Brown MRN: [id number] DOB: [birth year] Today's Date: 01/03/2021    History of Present Illness The pt is an 87 yo female presenting 8/6 after sustaining a fall down 2 stairs at home. Work up revealed R tibial plateau fx, R wrist fx, L rib 7-8 fx. Plan for non-op management of all injuries. PMH includes: HTN, arthritis, HLD, PVD status post left SFA and popliteal stenting 04/2020 on aspirin Plavix, and admission for PE 08/09/20.    PT Comments    Pt supine in bed.  Focused on transfer training and bed mobility.  Continues to require heavy Mod assistance.  Pt continues to benefit from rehab in a post acute setting but family and patient prefer home. Will inform supervising PT of change in recommendations and DME listed below.  She may also need PTAR transport home.     Follow Up Recommendations  Home health PT (refusing placement and CIR signed off.)     Equipment Recommendations  Wheelchair (measurements PT);Wheelchair cushion (measurements PT);Rolling walker with 5" wheels;Hospital bed (youth height RW with platform attachment.  sliding board.)    Recommendations for Other Services       Precautions / Restrictions Precautions Precautions: Fall Precaution Comments: R UE & LE fxs, L 7-8 rib fxs Required Braces or Orthoses: Knee Immobilizer - Right;Splint/Cast Knee Immobilizer - Right: On at all times Splint/Cast: Removable Velcro wrist splint, on at all times Restrictions Weight Bearing Restrictions: Yes RUE Weight Bearing: Partial weight bearing RUE Partial Weight Bearing Percentage or Pounds: 50 % with splint on RLE Weight Bearing: Non weight bearing Other Position/Activity Restrictions: verbal orders for TDWB for transfers only    Mobility  Bed Mobility Overal bed mobility: Needs Assistance Bed Mobility: Supine to Sit;Sit to Supine     Supine to sit: Mod assist;Min assist     General bed mobility comments: Min  assistance to move LEs and rise into sitting mod assistance to scoot to edge of bed.    Transfers Overall transfer level: Needs assistance Equipment used: Right platform walker Transfers: Sit to/from Stand Sit to Stand: Mod assist;From elevated surface         General transfer comment: Stood from edge of bed with youth height RW with R platform attached.  Able to perform static stance with mod assistance for around 2-3 min.  Cues for hand placement and weight bearing on RLE.  Ambulation/Gait Ambulation/Gait assistance:  (Unable.)               Stairs             Wheelchair Mobility    Modified Rankin (Stroke Patients Only)       Balance Overall balance assessment: Needs assistance;History of Falls Sitting-balance support: Feet supported Sitting balance-Leahy Scale: Fair       Standing balance-Leahy Scale: Poor Standing balance comment: dependent on RW and maxA of 2                            Cognition Arousal/Alertness: Awake/alert Behavior During Therapy: WFL for tasks assessed/performed Overall Cognitive Status: Within Functional Limits for tasks assessed                                 General Comments: increased time for processing and initiation      Exercises      General Comments  Pertinent Vitals/Pain Pain Assessment: Faces Faces Pain Scale: Hurts even more Pain Location: L ribs > R extremeties Pain Descriptors / Indicators: Discomfort;Grimacing Pain Intervention(s): Monitored during session;Repositioned    Home Living                      Prior Function            PT Goals (current goals can now be found in the care plan section) Acute Rehab PT Goals Patient Stated Goal: home Potential to Achieve Goals: Good Progress towards PT goals: Progressing toward goals    Frequency    Min 5X/week      PT Plan Discharge plan needs to be updated    Co-evaluation               AM-PAC PT "6 Clicks" Mobility   Outcome Measure  Help needed turning from your back to your side while in a flat bed without using bedrails?: A Lot Help needed moving from lying on your back to sitting on the side of a flat bed without using bedrails?: A Lot Help needed moving to and from a bed to a chair (including a wheelchair)?: Total Help needed standing up from a chair using your arms (e.g., wheelchair or bedside chair)?: A Lot Help needed to walk in hospital room?: Total Help needed climbing 3-5 steps with a railing? : Total 6 Click Score: 9    End of Session Equipment Utilized During Treatment: Gait belt Activity Tolerance: Patient limited by fatigue Patient left: in chair;with call bell/phone within reach;with chair alarm set;with family/visitor present Nurse Communication: Mobility status PT Visit Diagnosis: Unsteadiness on feet (R26.81);Other abnormalities of gait and mobility (R26.89);Muscle weakness (generalized) (M62.81);Pain Pain - Right/Left: Right Pain - part of body: Arm;Leg     Time: 3893-7342 PT Time Calculation (min) (ACUTE ONLY): 38 min  Charges:  $Therapeutic Activity: 38-52 mins                     Lynn Brown R. , PTA Acute Rehabilitation Services Pager [phone] Office [phone]    Almyra Birman Artis Delay 01/03/2021, 5:30 PM

## 2021-01-03 NOTE — Plan of Care (Signed)

## 2021-01-03 NOTE — Care Management Important Message (Signed)
Important Message  Patient Details  Name: Lynn Brown MRN: [id number] Date of Birth: 1931-07-29   Medicare Important Message Given:  Yes     Dorena Bodo 01/03/2021, 3:16 PM

## 2021-01-04 ENCOUNTER — Ambulatory Visit: Payer: Medicare Other | Admitting: Pulmonary Disease

## 2021-01-04 MED ORDER — SENNA 8.6 MG PO TABS
1.0000 | ORAL_TABLET | Freq: Every day | ORAL | Status: DC
  Administered 2021-01-04 – 2021-01-05 (×2): 8.6 mg via ORAL
  Filled 2021-01-04 (×2): qty 1

## 2021-01-04 MED ORDER — MAGNESIUM HYDROXIDE 400 MG/5ML PO SUSP
30.0000 mL | Freq: Once | ORAL | Status: AC
  Administered 2021-01-04: 30 mL via ORAL
  Filled 2021-01-04 (×2): qty 30

## 2021-01-04 NOTE — Progress Notes (Signed)
   Trauma/Critical Care Follow Up Note  Subjective:    Overnight Issues:   Objective:  Vital signs for last 24 hours: Temp:  [97.8 F (36.6 C)-98.4 F (36.9 C)] 97.8 F (36.6 C) (08/10 1158) Pulse Rate:  [56-85] 56 (08/10 1158) Resp:  [16-18] 16 (08/10 1158) BP: (98-130)/(56-70) 106/66 (08/10 1158) SpO2:  [91 %-98 %] 94 % (08/10 1158)  Hemodynamic parameters for last 24 hours:    Intake/Output from previous day: 08/09 0701 - 08/10 0700 In: 240 [P.O.:240] Out: 1225 [Urine:1225]  Intake/Output this shift: Total I/O In: -  Out: 500 [Urine:500]  Vent settings for last 24 hours:    Physical Exam:  Gen: comfortable, no distress Neuro: non-focal exam HEENT: PERRL Neck: supple CV: RRR Pulm: unlabored breathing Abd: soft, NT GU: clear yellow urine Extr: wwp, no edema, R wrist splinted, LLE in KI   No results found for this or any previous visit (from the past 24 hour(s)).  Assessment & Plan:  Present on Admission:  Fall from steps    LOS: 4 days   Additional comments:I reviewed the patient's new clinical lab test results.   and I reviewed the patients new imaging test results.     Fall   Left rib fractures (7-8) - Pulm toilet. IS Right wrist fracture - Per Ortho/Hand. Removable Velcro wrist splint. PT/OT Right tibial plateau fracture - Per Ortho. TDWB for transfers only. Otherwise NWB. KI. Follow up in 2 weeks with Dr Ave Filter for repeat xrays. PT/OT H/o NSTEMI H/o PE - restarted Eliquis Hx Chronic diastolic heart failure  Hx HTN - home meds Hx HLD - home meds Hx Hypothroidism - home synthroid BLE edema - home lasix FEN - Soft, awaiting BM VTE - SCDs, restarted home Eliquis ID - none Foley - None. Voiding. Dispo: plan for d/c home, awaiting delivery of hospital equipment     Diamantina Monks, MD Trauma & General Surgery Please use [email or web address] to contact on call provider  01/04/2021  *Care during the described time interval was provided by me. I  have reviewed this patient's available data, including medical history, events of note, physical examination and test results as part of my evaluation.

## 2021-01-04 NOTE — Progress Notes (Signed)
       Subjective: CC: Delayed entry. Patient was supposed to be d/c this am  Doing well. Having pain in her left ribs and right knee. No real pain in the right wrist. No other areas of pain. Tolerating diet without n/v. Had chick-fila for dinner yesterday. Passing flatus. No BM. Voiding. Working with therapies.   Objective: Vital signs in last 24 hours: Temp:  [97.8 F (36.6 C)-98.4 F (36.9 C)] 97.8 F (36.6 C) (08/10 1158) Pulse Rate:  [56-85] 56 (08/10 1158) Resp:  [16-18] 16 (08/10 1158) BP: (98-130)/(56-70) 106/66 (08/10 1158) SpO2:  [91 %-98 %] 94 % (08/10 1158) Last BM Date: 12/29/20  Intake/Output from previous day: 08/09 0701 - 08/10 0700 In: 240 [P.O.:240] Out: 1225 [Urine:1225] Intake/Output this shift: Total I/O In: -  Out: 500 [Urine:500]  PE: Gen:  Alert, NAD, pleasant HEENT: Pupils equal and round Card:  RRR Pulm:  CTAB, no W/R/R, effort normal, left rib ttp Abd: Soft, ND, NT +BS Ext: R wrist splint in place. Digitis wwp. Right knee immobilizer in place. No LE edema Psych: A&Ox3 Skin: no rashes noted, warm and dry  Lab Results:  Recent Labs    01/02/21 1040  WBC 5.0  HGB 13.0  HCT 40.6  PLT 156   BMET Recent Labs    01/02/21 1040  NA 136  K 4.5  CL 104  CO2 24  GLUCOSE 112*  BUN 15  CREATININE 0.91  CALCIUM 9.0   PT/INR No results for input(s): LABPROT, INR in the last 72 hours. CMP     Component Value Date/Time   NA 136 01/02/2021 1040   NA 142 07/04/2018 1404   K 4.5 01/02/2021 1040   CL 104 01/02/2021 1040   CO2 24 01/02/2021 1040   GLUCOSE 112 (H) 01/02/2021 1040   BUN 15 01/02/2021 1040   BUN 19 07/04/2018 1404   CREATININE 0.91 01/02/2021 1040   CREATININE 0.93 11/18/2020 1248   CALCIUM 9.0 01/02/2021 1040   PROT 7.6 11/18/2020 1248   PROT 7.3 09/25/2017 0943   ALBUMIN 3.9 11/18/2020 1248   ALBUMIN 4.5 09/25/2017 0943   AST 18 11/18/2020 1248   ALT 13 11/18/2020 1248   ALKPHOS 59 11/18/2020 1248   BILITOT 0.8  11/18/2020 1248   GFRNONAA >60 01/02/2021 1040   GFRNONAA 60 (L) 11/18/2020 1248   GFRAA 58 (L) 07/04/2018 1404   Lipase  No results found for: LIPASE  Studies/Results: No results found.  Anti-infectives: Anti-infectives (From admission, onward)    None        Assessment/Plan Fall   Left rib fractures (7-8) - Pulm toilet. IS Right wrist fracture - Per Ortho/Hand. Removable Velcro wrist splint. PT/OT Right tibial plateau fracture - Per Ortho. TDWB for transfers only. Otherwise NWB. KI. Follow up in 2 weeks with Dr Ave Filter for repeat xrays. PT/OT H/o NSTEMI H/o PE - restarted Eliquis Hx Chronic diastolic heart failure  Hx HTN - home meds Hx HLD - home meds Hx Hypothroidism - home synthroid BLE edema - home lasix FEN - Soft, bowel regimen VTE - SCDs, restarted home Eliquis ID - none Foley - None. Voiding. Dispo: D/c AM w/ HH and DME    LOS: 4 days    Jacinto Halim , [facility] Surgery 01/04/2021, 2:54 PM Please see Amion for pager number during day hours 7:00am-4:30pm

## 2021-01-04 NOTE — TOC Progression Note (Signed)
Transition of Care [facility]) - Progression Note    Patient Details  Name: Lynn Brown MRN: [id number] Date of Birth: 12-Jan-1932  [facility] CM/SW Contact  Astrid Drafts Berna Spare, RN Phone Number: 01/04/2021, 2:35 PM  Clinical Narrative: Patient approaching medical stability for discharge; still needs to have bowel movement prior to discharge, per MD.  Spoke with patient's daughter, [name], regarding delivery of hospital bed and slide board to home.  She states she has not heard from [facility], but they are to call her prior to their arrival.  I was able to speak with [employer] [facility] rep who states that DME should be delivered within an hour from this time.  Unable to secure right platform for patient's walker, as area DME agencies currently do not have them in stock.  I spoke with [facility] on [address] and they state that they have right platform in stock at a cost of $80.  Patient's daughter states she is familiar with this medical equipment store, and will plan to pick up right platform for pt;s walker.  Daughter expresses that she does not want her mother discharged home in the evening, and would prefer to have her discharge in a.m.  As patient still needs to have a BM, and is awaiting equipment delivery would recommend that patient discharged tomorrow via non-emergent ambulance transport.  Notified PA of my discussion with daughter.    Expected Discharge Plan: Home w Home Health Services Barriers to Discharge: Continued Medical Work up  Expected Discharge Plan and Services Expected Discharge Plan: Home w Home Health Services   Discharge Planning Services: CM Consult Post Acute Care Choice: Home Health Living arrangements for the past 2 months: Single Family Home Expected Discharge Date: 01/04/21               DME Arranged: Hospital bed, Other see comment (slide board) DME Agency: [facility] Date DME Agency Contacted: 01/03/21 Time DME Agency Contacted:  [phone] Representative spoke with at DME Agency: Velna Hatchet HH Arranged: PT, OT [facility] Agency: [facility] Date [facility] Agency Contacted: 01/03/21 Time HH Agency Contacted: [phone] Representative spoke with at [facility] Agency: Lorenza Chick   Social Determinants of Health (SDOH) Interventions    Readmission Risk Interventions No flowsheet data found.  Quintella Baton, RN, BSN  Trauma/Neuro ICU Case Manager [phone]

## 2021-01-04 NOTE — Progress Notes (Signed)
Physical Therapy Treatment Patient Details Name: Lynn Brown MRN: [id number] DOB: [birth year] Today's Date: 01/04/2021    History of Present Illness The pt is an 87 yo female presenting 8/6 after sustaining a fall down 2 stairs at home. Work up revealed R tibial plateau fx, R wrist fx, L rib 7-8 fx. Plan for non-op management of all injuries. PMH includes: HTN, arthritis, HLD, PVD status post left SFA and popliteal stenting 04/2020 on aspirin Plavix, and admission for PE 08/09/20.    PT Comments    Pt making gradual progress.  She continues to have difficulty with TDWB status for transfers - recommend lateral scoots.  Family had gone home to receive hospital bed delivery and not available for transfer training.  Pt tolerated therapy well in regards to pain control.  Therapy recommended post acute care but family declined, so will need below DME and 24 hr support.  Also, recommend PTAR transport home.   Continue to progress as able.    Follow Up Recommendations  Other (comment) (Recommend CIR but family refused so will need max HH services/PT and 24 hr assist.  Also recommend PTAR transport home)     Equipment Recommendations  Wheelchair (measurements PT);Wheelchair cushion (measurements PT);Rolling walker with 5" wheels;Hospital bed (youth height RW with platform attachment. sliding board.)    Recommendations for Other Services       Precautions / Restrictions Precautions Precautions: Fall Precaution Comments: R UE & LE fxs, L 7-8 rib fxs Required Braces or Orthoses: Knee Immobilizer - Right;Splint/Cast Knee Immobilizer - Right: On at all times Splint/Cast: Removable Velcro wrist splint, on at all times Restrictions RUE Weight Bearing: Partial weight bearing RUE Partial Weight Bearing Percentage or Pounds: 50% with splint on RLE Weight Bearing: Non weight bearing Other Position/Activity Restrictions: verbal orders for TDWB for transfers only    Mobility  Bed Mobility Overal bed  mobility: Needs Assistance Bed Mobility: Supine to Sit     Supine to sit: Mod assist     General bed mobility comments: Min assistance to move LEs and rise into sitting mod assistance to scoot to edge of bed.  Educated on use of draw sheet -would be beneficial at home for family    Transfers Overall transfer level: Needs assistance Equipment used: Right platform walker Transfers: Sit to/from [employer] Transfers Sit to Stand: Mod assist;From elevated surface;+2 safety/equipment Stand pivot transfers: Mod assist;+2 physical assistance       General transfer comment: Stood from edge of bed with youth height RW with R platform attached - cues for use of platform and NWB on R LE.  Pivoted to chair with mod A x 2 and frequent multimodal cues.  Pt had difficulty maintaining TDWB on R LE.  Advised use of lift or lateral scoot back to bed (RN aware)  Ambulation/Gait             General Gait Details: pt unable to generate any steps while maintaining WB status despite maxA of 2   Stairs             Wheelchair Mobility    Modified Rankin (Stroke Patients Only)       Balance Overall balance assessment: Needs assistance;History of Falls Sitting-balance support: Feet supported;No upper extremity supported Sitting balance-Leahy Scale: Fair     Standing balance support: Bilateral upper extremity supported Standing balance-Leahy Scale: Poor Standing balance comment: Requiring RW but can maintain static stand with min guard  Cognition Arousal/Alertness: Awake/alert Behavior During Therapy: WFL for tasks assessed/performed Overall Cognitive Status: Within Functional Limits for tasks assessed                                 General Comments: increased time for processing and initiation      Exercises General Exercises - Lower Extremity Ankle Circles/Pumps: AROM;Both;10 reps;Supine Long Arc Quad: AROM;Left;10  reps;Seated Straight Leg Raises: AAROM;Right;5 reps;Supine    General Comments General comments (skin integrity, edema, etc.): Pt on RA with O2 sats stable during session. Pt and family have declined SNF or CIR.  Discussed DME needs hospital bed, youth RW with platform, sliding board, w/c and bsc with drop arms.  Also recommend PTAR transport.      Pertinent Vitals/Pain Pain Assessment: 0-10 Pain Score: 3  Pain Location: L ribs > R extremeties Pain Descriptors / Indicators: Discomfort Pain Intervention(s): Limited activity within patient's tolerance;Monitored during session;Repositioned    Home Living                      Prior Function            PT Goals (current goals can now be found in the care plan section) Progress towards PT goals: Progressing toward goals    Frequency    Min 5X/week      PT Plan Current plan remains appropriate    Co-evaluation              AM-PAC PT "6 Clicks" Mobility   Outcome Measure  Help needed turning from your back to your side while in a flat bed without using bedrails?: A Lot Help needed moving from lying on your back to sitting on the side of a flat bed without using bedrails?: A Lot Help needed moving to and from a bed to a chair (including a wheelchair)?: Total Help needed standing up from a chair using your arms (e.g., wheelchair or bedside chair)?: Total Help needed to walk in hospital room?: Total Help needed climbing 3-5 steps with a railing? : Total 6 Click Score: 8    End of Session Equipment Utilized During Treatment: Gait belt Activity Tolerance: Patient tolerated treatment well;Other (comment) (limitations in ability to maintain TDWB) Patient left: in chair;with call bell/phone within reach;with chair alarm set Nurse Communication: Mobility status;Need for lift equipment (lift vs lateral scoot back to bed) PT Visit Diagnosis: Unsteadiness on feet (R26.81);Other abnormalities of gait and mobility  (R26.89);Muscle weakness (generalized) (M62.81);Pain Pain - Right/Left: Right Pain - part of body: Arm;Leg     Time: 5701-7793 PT Time Calculation (min) (ACUTE ONLY): 23 min  Charges:  $Therapeutic Exercise: 8-22 mins $Therapeutic Activity: 8-22 mins                     Anise Salvo, PT Acute Rehab Services Pager [phone] [facility] [phone]    Rayetta Humphrey 01/04/2021, 5:16 PM

## 2021-01-04 NOTE — Plan of Care (Signed)

## 2021-01-05 ENCOUNTER — Other Ambulatory Visit (HOSPITAL_COMMUNITY): Payer: Self-pay

## 2021-01-05 MED ORDER — METHOCARBAMOL 500 MG PO TABS
500.0000 mg | ORAL_TABLET | Freq: Four times a day (QID) | ORAL | 0 refills | Status: DC | PRN
  Filled 2021-01-05: qty 45, 12d supply, fill #0

## 2021-01-05 MED ORDER — FLEET ENEMA 7-19 GM/118ML RE ENEM
1.0000 | ENEMA | Freq: Once | RECTAL | Status: AC
  Administered 2021-01-05: 1 via RECTAL
  Filled 2021-01-05: qty 1

## 2021-01-05 MED ORDER — OXYCODONE HCL 5 MG PO TABS
5.0000 mg | ORAL_TABLET | Freq: Four times a day (QID) | ORAL | 0 refills | Status: DC | PRN
  Filled 2021-01-05: qty 20, 3d supply, fill #0

## 2021-01-05 NOTE — Progress Notes (Addendum)
Subjective: CC: Doing well. Having pain in her left ribs and right knee. No pain in the right wrist. No other areas of pain. Tolerating diet without n/v.  Passing flatus. No BM. Voiding. Working with therapies. Daughter at bedside. Reports that hospital bed was delivered yesterday. No IS in room. I brought her one. She pulled 1000 on IS.   Objective: Vital signs in last 24 hours: Temp:  [97.6 F (36.4 C)-98.6 F (37 C)] 97.9 F (36.6 C) (08/11 0745) Pulse Rate:  [56-72] 60 (08/11 0745) Resp:  [16-20] 16 (08/11 0745) BP: (106-137)/(66-77) 125/73 (08/11 0745) SpO2:  [87 %-96 %] 96 % (08/11 0745) Last BM Date: 12/29/20  Intake/Output from previous day: 08/10 0701 - 08/11 0700 In: -  Out: 1100 [Urine:1100] Intake/Output this shift: Total I/O In: 240 [P.O.:240] Out: -   PE: Gen:  Alert, NAD, pleasant HEENT: Pupils equal and round Card:  RRR Pulm:  CTAB, no W/R/R, effort normal, left rib ttp, 1000 on IS.  Abd: Soft, ND, NT +BS Ext: R wrist splint in place. Digitis wwp. Right knee immobilizer in place. No LE edema Psych: A&Ox3 Skin: no rashes noted, warm and dry    Lab Results:  Recent Labs    01/02/21 1040  WBC 5.0  HGB 13.0  HCT 40.6  PLT 156   BMET Recent Labs    01/02/21 1040  NA 136  K 4.5  CL 104  CO2 24  GLUCOSE 112*  BUN 15  CREATININE 0.91  CALCIUM 9.0   PT/INR No results for input(s): LABPROT, INR in the last 72 hours. CMP     Component Value Date/Time   NA 136 01/02/2021 1040   NA 142 07/04/2018 1404   K 4.5 01/02/2021 1040   CL 104 01/02/2021 1040   CO2 24 01/02/2021 1040   GLUCOSE 112 (H) 01/02/2021 1040   BUN 15 01/02/2021 1040   BUN 19 07/04/2018 1404   CREATININE 0.91 01/02/2021 1040   CREATININE 0.93 11/18/2020 1248   CALCIUM 9.0 01/02/2021 1040   PROT 7.6 11/18/2020 1248   PROT 7.3 09/25/2017 0943   ALBUMIN 3.9 11/18/2020 1248   ALBUMIN 4.5 09/25/2017 0943   AST 18 11/18/2020 1248   ALT 13 11/18/2020 1248   ALKPHOS  59 11/18/2020 1248   BILITOT 0.8 11/18/2020 1248   GFRNONAA >60 01/02/2021 1040   GFRNONAA 60 (L) 11/18/2020 1248   GFRAA 58 (L) 07/04/2018 1404   Lipase  No results found for: LIPASE  Studies/Results: No results found.  Anti-infectives: Anti-infectives (From admission, onward)    None        Assessment/Plan Fall Left rib fractures (7-8) - Pulm toilet. IS Right wrist fracture - Per Ortho/Hand. Partial WB. Removable Velcro wrist splint. PT/OT Right tibial plateau fracture - Per Ortho. NWB, TDWB for transfers only. KI. Follow up in 2 weeks with Dr Ave Filter for repeat xrays. PT/OT H/o NSTEMI H/o PE - home Eliquis Hx Chronic diastolic heart failure  Hx HTN - home meds Hx HLD - home meds Hx Hypothroidism - home synthroid BLE edema - no LE edema on exam today. Continue home lasix FEN - Soft, bowel regimen VTE - SCDs, home Eliquis ID - none Foley - None. Voiding. Dispo: Hospital bed has been delivered. Remaining DME to be delivered today per family. Daughter will be able to provide 24 hour assistance at d/c. Referral by TOC made to [facility]. D/c today after BM and remaining DME delivered. Will  need PTAR transport. Will reach out to [facility].    LOS: 5 days    Jacinto Halim , [facility] 01/05/2021, 9:08 AM Please see Amion for pager number during day hours 7:00am-4:30pm

## 2021-01-05 NOTE — Progress Notes (Signed)
Discharge package printed and instructions given to pt's daughter. Verbalizes understanding.

## 2021-01-05 NOTE — Discharge Summary (Signed)
Patient ID: Lynn Brown [id number] Nov 25, 1931 87 y.o.  Admit date: 12/31/2020 Discharge date: 01/05/2021  Admitting Diagnosis: Fall Two left rib fractures (7-8) Possible right wrist fracture Possible right tibial plateau fracture. H/o NSTEMI H/o PE Chronic diastolic heart failure HTN BLE edema  Discharge Diagnosis Fall Left rib fractures (7-8)  Right wrist fracture  Right tibial plateau fracture  H/o NSTEMI H/o PE  Hx Chronic diastolic heart failure  Hx HTN  Hx HLD  Hx Hypothroidism   Consultants Ortho  H&P Pt is an 87 yo F who came in as an unleveled trauma following a fall that occurred yesterday (12/30/20).  She was going downstairs and missed two steps, striking her left chest, right wrist, and right knee.  She had knee pain, chest pain, and wrist pain.  She was able to get to bed last night, but had issues sleeping due to pain.  Once her daughter came to see her and found out what happened, she came to ED. She had increasing difficulty bearing weight this AM and was sore all over. ED workup showed two rib fractures, and possible wrist fracture and possible tibial plateau fractures.  Given continued issues with weight bearing, we are asked to admit.  She denies any falls in the past few years until this one.  She had no dizziness or syncope surrounding the fall.  She states that she was talking to her son and wasn't paying close attention to what she was doing.    Procedures None  Hospital Course:  Lynn Brown is a 87 y.o. female who presented as a non-level trauma following a fall. She was found to have Left rib fractures (7-8), Right wrist fracture and Right tibial plateau fracture. Orthopedics was consulted and recommended non-op, partial WB to RUE and NWB RLE in KI except TDWB for transfers only. They recommended follow up with Dr. Ave Filter in 2 weeks. Home meds restarted. Patient worked with therapies who recommended CIR however patient and family preferred  direct d/c home. HH was arranged along with DME. On 8/11 the patient was voiding well, tolerating diet, working well with therapies, pain well controlled, vital signs stable and felt stable for discharge home. Return precautions discussed. Follow up as noted below.    Allergies as of 01/05/2021   No Known Allergies      Medication List     TAKE these medications    Acetaminophen 500 MG capsule Take 500 mg by mouth every 6 (six) hours as needed for pain.   apixaban 5 MG Tabs tablet Commonly known as: ELIQUIS Take 1 tablet (5 mg total) by mouth 2 (two) times daily.   ascorbic acid 500 MG tablet Commonly known as: VITAMIN C Take 500 mg by mouth in the morning and at bedtime.   aspirin EC 81 MG tablet Take 1 tablet (81 mg total) by mouth daily. Swallow whole.   Calcium Citrate-Vitamin D 315-250 MG-UNIT Tabs Take 1 tablet by mouth daily.   cetirizine 10 MG tablet Commonly known as: ZYRTEC Take 10 mg by mouth daily as needed (allergies.).   clorazepate 7.5 MG tablet Commonly known as: TRANXENE Take 7.5 mg by mouth daily as needed for anxiety.   COQ10 PO Take 1 capsule by mouth daily at 2 PM.   furosemide 20 MG tablet Commonly known as: LASIX Take 20 mg by mouth every morning.   levothyroxine 112 MCG tablet Commonly known as: SYNTHROID Take 112 mcg by mouth every morning.   magnesium oxide  400 MG tablet Commonly known as: MAG-OX Take 400 mg by mouth every evening.   methocarbamol 500 MG tablet Commonly known as: ROBAXIN Take 1 tablet (500 mg total) by mouth every 6 (six) hours as needed for muscle spasms.   metoprolol tartrate 25 MG tablet Commonly known as: LOPRESSOR Take 0.5 tablets (12.5 mg total) by mouth 2 (two) times daily.   nitroGLYCERIN 0.4 MG SL tablet Commonly known as: NITROSTAT Place 1 tablet (0.4 mg total) under the tongue every 5 (five) minutes as needed for chest pain.   oxyCODONE 5 MG immediate release tablet Commonly known as: Oxy  IR/ROXICODONE Take 1-2 tablets (5-10 mg total) by mouth every 6 (six) hours as needed for breakthrough pain.   rosuvastatin 20 MG tablet Commonly known as: CRESTOR Take 20 mg by mouth every evening.   VITAMIN D PO Take 1 tablet by mouth daily at 2 PM.       ASK your doctor about these medications    spironolactone 25 MG tablet Commonly known as: ALDACTONE TAKE 1 TABLET BY MOUTH  DAILY               Durable Medical Equipment  (From admission, onward)           Start     Ordered   01/04/21 1241  For home use only DME Other see comment  Once       Comments: Sliding board  Question:  Length of Need  Answer:  6 Months   01/04/21 1241   01/04/21 1240  For home use only DME Walker platform  Once       Comments: Youth RW with Rt platform attachment  Question:  Patient needs a walker to treat with the following condition  Answer:  Right medial tibial plateau fracture   01/04/21 1241   01/03/21 1524  For home use only DME Hospital bed  Once       Question Answer Comment  Length of Need 6 Months   Patient has (list medical condition): Left rib fractures (7-8), Right wrist fracture, Right tibial plateau fracture   The above medical condition requires: Patient requires the ability to reposition frequently   Bed type Semi-electric      01/03/21 1535   01/03/21 0849  For home use only DME 3 n 1  Once        01/03/21 0848   01/03/21 0848  For home use only DME Walker rolling  Once       Comments: With R platform  Question Answer Comment  Walker: With 5 Inch Wheels   Patient needs a walker to treat with the following condition Rib fractures      01/03/21 0848   01/03/21 0846  For home use only DME standard manual wheelchair with seat cushion  Once       Comments: Patient suffers from Left rib fractures (7-8), Right wrist fracture, Right tibial plateau fracture which impairs their ability to perform daily activities like bathing, dressing, feeding, grooming, and toileting  in the home.  A cane, crutch, or walker will not resolve issue with performing activities of daily living. A wheelchair will allow patient to safely perform daily activities. Patient can safely propel the wheelchair in the home or has a caregiver who can provide assistance. Length of need 6 months . Accessories: elevating leg rests (ELRs), wheel locks, extensions and anti-tippers.   01/03/21 0848              Follow-up  Information     Jones Broom, MD Follow up in 2 week(s).   Specialty: Orthopedic Surgery Contact information: [address] [place] [address] [phone]         [facility] Follow up.   Specialty: Home Health Services Why: Home health physical and occupational therapies; agency will call you to schedule appointments Contact information: [address] [place] [address] [phone]         Laurann Montana, MD Follow up.   Specialty: Family Medicine Why: For follow up Contact information: [address] [place] [address] [phone]         [facility] Follow up.   Why: As needed Contact information: [address] [place] [address] [phone]                Signed: Leary Roca, [facility] 01/05/2021, 4:31 PM Please see Amion for pager number during day hours 7:00am-4:30pm

## 2021-01-05 NOTE — Progress Notes (Signed)
Physical Therapy Treatment Patient Details Name: Lynn Brown MRN: [id number] DOB: 1931-08-27 Today's Date: 01/05/2021    History of Present Illness The pt is an 87 yo female presenting 8/6 after sustaining a fall down 2 stairs at home. Work up revealed R tibial plateau fx, R wrist fx, L rib 7-8 fx. Plan for non-op management of all injuries. PMH includes: HTN, arthritis, HLD, PVD status post left SFA and popliteal stenting 04/2020 on aspirin Plavix, and admission for PE 08/09/20.    PT Comments    Pt supine in bed on arrival this session.  She was on the bed pan but unable to produce a BM.  Pt transferred to bedside commode without DME and used R platform RW to move into standing from commode and back to bed.    Pt to d/c home later today with support from her family.  She continues to benefit from post acute rehab to improve strength and function but family is choosing to d/c home.   Follow Up Recommendations  Other (comment) (Recommend CIR but family refused so will need max HH services/PT and 24 hr assist.  Also recommend PTAR transport home)     Equipment Recommendations  Wheelchair (measurements PT);Wheelchair cushion (measurements PT);Rolling walker with 5" wheels;Hospital bed (youth height RW with platform attachment, sliding board)    Recommendations for Other Services       Precautions / Restrictions Precautions Precautions: Fall Precaution Comments: R UE & LE fxs, L 7-8 rib fxs Required Braces or Orthoses: Knee Immobilizer - Right;Splint/Cast Knee Immobilizer - Right: On at all times Splint/Cast: Removable Velcro wrist splint, on at all times Restrictions Weight Bearing Restrictions: Yes RUE Weight Bearing: Partial weight bearing RUE Partial Weight Bearing Percentage or Pounds: 50% RLE Weight Bearing: Non weight bearing Other Position/Activity Restrictions: verbal orders for TDWB for transfers only    Mobility  Bed Mobility Overal bed mobility: Needs Assistance Bed  Mobility: Supine to Sit;Sit to Supine     Supine to sit: Mod assist Sit to supine: Mod assist   General bed mobility comments: Mod assistance to move to edge of bed and elevate trunk into a seated position.  Cues for hand placement and increased time and effort to scoot to edge of bed.  To return to bed required lifting of R LE.    Transfers Overall transfer level: Needs assistance Equipment used: Right platform walker Transfers: Sit to/from Stand;Stand Pivot Transfers Sit to Stand: Mod assist;From elevated surface;+2 safety/equipment Stand pivot transfers: Mod assist;+2 physical assistance (with out platform to commode and used platform for back to bed and to maintain standing during pericare.)       General transfer comment: Pt required cues for hand placement and weight bearing.  She continues to have difficulty maintaining weight bearing.  Ambulation/Gait Ambulation/Gait assistance:  (unable to maintain weight bearing to progress gt)               Stairs             Wheelchair Mobility    Modified Rankin (Stroke Patients Only)       Balance Overall balance assessment: Needs assistance;History of Falls Sitting-balance support: Feet supported;No upper extremity supported Sitting balance-Leahy Scale: Fair       Standing balance-Leahy Scale: Poor                              Cognition Arousal/Alertness: Awake/alert Behavior During Therapy: WFL for tasks  assessed/performed Overall Cognitive Status: Within Functional Limits for tasks assessed                                 General Comments: increased time for processing and initiation      Exercises      General Comments        Pertinent Vitals/Pain Pain Assessment: Faces Faces Pain Scale: Hurts little more Pain Location: L ribs > R extremeties Pain Descriptors / Indicators: Discomfort Pain Intervention(s): Monitored during session;Repositioned    Home Living                       Prior Function            PT Goals (current goals can now be found in the care plan section) Acute Rehab PT Goals Patient Stated Goal: home Potential to Achieve Goals: Good Progress towards PT goals: Progressing toward goals    Frequency    Min 5X/week      PT Plan Current plan remains appropriate    Co-evaluation              AM-PAC PT "6 Clicks" Mobility   Outcome Measure  Help needed turning from your back to your side while in a flat bed without using bedrails?: A Lot Help needed moving from lying on your back to sitting on the side of a flat bed without using bedrails?: A Lot Help needed moving to and from a bed to a chair (including a wheelchair)?: A Lot Help needed standing up from a chair using your arms (e.g., wheelchair or bedside chair)?: A Lot Help needed to walk in hospital room?: Total Help needed climbing 3-5 steps with a railing? : Total 6 Click Score: 10    End of Session Equipment Utilized During Treatment: Gait belt Activity Tolerance: Patient tolerated treatment well;Other (comment) (limitations to maintain weight bearing) Patient left: in bed;with bed alarm set;with call bell/phone within reach Nurse Communication: Mobility status;Need for lift equipment PT Visit Diagnosis: Unsteadiness on feet (R26.81);Other abnormalities of gait and mobility (R26.89);Muscle weakness (generalized) (M62.81);Pain Pain - Right/Left: Right Pain - part of body: Arm;Leg     Time: 0623-7628 PT Time Calculation (min) (ACUTE ONLY): 23 min  Charges:  $Therapeutic Activity: 23-37 mins                     Bonney Leitz , PTA Acute Rehabilitation Services Pager [phone] Office [phone]    Lynn Brown 01/05/2021, 1:28 PM

## 2021-01-05 NOTE — TOC Transition Note (Signed)
Transition of Care [facility]) - CM/SW Discharge Note   Patient Details  Name: Lynn Brown MRN: [id number] Date of Birth: 1931/12/02  Transition of Care [facility] CM/SW Contact:  Glennon Mac, RN Phone Number: 01/05/2021, 1:19 PM   Clinical Narrative:   Pt medically stable for discharge home today with daughter to provide 24h care.  Notified [facility] of patient discharge.  PTAR notified for transport; daughter [name] in agreement with calling for transport at this time.      Final next level of care: Home w Home Health Services Barriers to Discharge: Barriers Resolved   Patient Goals and CMS Choice Patient states their goals for this hospitalization and ongoing recovery are:: to go home CMS Medicare.gov Compare Post Acute Care list provided to:: Patient Represenative (must comment) (daughter) Choice offered to / list presented to : Adult Children                        Discharge Plan and Services   Discharge Planning Services: CM Consult Post Acute Care Choice: Home Health          DME Arranged: Hospital bed, Other see comment (slide board) DME Agency: [employer] Date DME Agency Contacted: 01/03/21 Time DME Agency Contacted: [phone] Representative spoke with at DME Agency: Velna Hatchet HH Arranged: PT, OT [facility] Agency: [facility] Date [facility] Agency Contacted: 01/03/21 Time HH Agency Contacted: [phone] Representative spoke with at [facility] Agency: Lorenza Chick  Social Determinants of Health (SDOH) Interventions     Readmission Risk Interventions Readmission Risk Prevention Plan 01/05/2021  Post Dischage Appt Complete  Medication Screening Complete  Transportation Screening Complete  Some recent data might be hidden   Quintella Baton, RN, BSN  Trauma/Neuro ICU Case Manager [phone]

## 2021-01-05 NOTE — Plan of Care (Signed)
  Problem: Activity: Goal: Risk for activity intolerance will decrease Outcome: Progressing   Problem: Pain Managment: Goal: General experience of comfort will improve Outcome: Progressing   Problem: Safety: Goal: Ability to remain free from injury will improve Outcome: Progressing   

## 2021-01-06 ENCOUNTER — Other Ambulatory Visit: Payer: Self-pay

## 2021-01-06 DIAGNOSIS — W109XXA Fall (on) (from) unspecified stairs and steps, initial encounter: Secondary | ICD-10-CM

## 2021-01-06 DIAGNOSIS — I214 Non-ST elevation (NSTEMI) myocardial infarction: Secondary | ICD-10-CM

## 2021-01-06 DIAGNOSIS — I5032 Chronic diastolic (congestive) heart failure: Secondary | ICD-10-CM

## 2021-01-06 DIAGNOSIS — I10 Essential (primary) hypertension: Secondary | ICD-10-CM

## 2021-01-06 NOTE — Patient Outreach (Signed)
[facility]) Care Management  01/06/2021  Lynn Brown Oct 29, 1931 [id number]  Hospital follow up:  referral  Spoke with inpatient [facility] RN regarding post hospital needs. Patient was for SNF however family and patient desired home with home health.  Patient/family for additional support for follow up and navigating care needs.  Primary Care Provider:  Laurann Montana, MD [facility]  Plan:  Referral request for [facility] RN Care Coordinator for support.  Daughter, Lynn Brown is the contact.  Charlesetta Shanks, RN BSN CCM [facility]  [phone] business mobile phone Toll free office [phone]  Fax number: [phone] [name]

## 2021-01-07 ENCOUNTER — Other Ambulatory Visit: Payer: Self-pay | Admitting: Physician Assistant

## 2021-01-08 DIAGNOSIS — M810 Age-related osteoporosis without current pathological fracture: Secondary | ICD-10-CM | POA: Diagnosis not present

## 2021-01-08 DIAGNOSIS — S2242XD Multiple fractures of ribs, left side, subsequent encounter for fracture with routine healing: Secondary | ICD-10-CM | POA: Diagnosis not present

## 2021-01-08 DIAGNOSIS — M5030 Other cervical disc degeneration, unspecified cervical region: Secondary | ICD-10-CM | POA: Diagnosis not present

## 2021-01-08 DIAGNOSIS — I110 Hypertensive heart disease with heart failure: Secondary | ICD-10-CM | POA: Diagnosis not present

## 2021-01-08 DIAGNOSIS — E7800 Pure hypercholesterolemia, unspecified: Secondary | ICD-10-CM | POA: Diagnosis not present

## 2021-01-08 DIAGNOSIS — I5032 Chronic diastolic (congestive) heart failure: Secondary | ICD-10-CM | POA: Diagnosis not present

## 2021-01-08 DIAGNOSIS — S82141D Displaced bicondylar fracture of right tibia, subsequent encounter for closed fracture with routine healing: Secondary | ICD-10-CM | POA: Diagnosis not present

## 2021-01-08 DIAGNOSIS — E039 Hypothyroidism, unspecified: Secondary | ICD-10-CM | POA: Diagnosis not present

## 2021-01-08 DIAGNOSIS — M170 Bilateral primary osteoarthritis of knee: Secondary | ICD-10-CM | POA: Diagnosis not present

## 2021-01-08 DIAGNOSIS — S62101D Fracture of unspecified carpal bone, right wrist, subsequent encounter for fracture with routine healing: Secondary | ICD-10-CM | POA: Diagnosis not present

## 2021-01-08 DIAGNOSIS — M4802 Spinal stenosis, cervical region: Secondary | ICD-10-CM | POA: Diagnosis not present

## 2021-01-08 DIAGNOSIS — M47812 Spondylosis without myelopathy or radiculopathy, cervical region: Secondary | ICD-10-CM | POA: Diagnosis not present

## 2021-01-10 ENCOUNTER — Other Ambulatory Visit: Payer: Self-pay | Admitting: *Deleted

## 2021-01-10 DIAGNOSIS — S2242XD Multiple fractures of ribs, left side, subsequent encounter for fracture with routine healing: Secondary | ICD-10-CM | POA: Diagnosis not present

## 2021-01-10 DIAGNOSIS — S82141D Displaced bicondylar fracture of right tibia, subsequent encounter for closed fracture with routine healing: Secondary | ICD-10-CM | POA: Diagnosis not present

## 2021-01-10 DIAGNOSIS — I110 Hypertensive heart disease with heart failure: Secondary | ICD-10-CM | POA: Diagnosis not present

## 2021-01-10 DIAGNOSIS — S62101D Fracture of unspecified carpal bone, right wrist, subsequent encounter for fracture with routine healing: Secondary | ICD-10-CM | POA: Diagnosis not present

## 2021-01-10 DIAGNOSIS — I5032 Chronic diastolic (congestive) heart failure: Secondary | ICD-10-CM | POA: Diagnosis not present

## 2021-01-10 NOTE — Patient Outreach (Signed)
[facility]) Care Management  01/10/2021  BEANCA KIESTER 10/03/1931 [id number]   Referral Date: 8/12 Referral Source: Hospital liaison Referral Reason: Recent hospital discharge Insurance: [facility]   Outreach attempt #1, successful to daughter Elita Quick.   Identity verified.  This care manager introduced self and stated purpose of call.  [facility] care management services explained.  Per daughter, they are not sure if they would like to participate, state they already have home health and follow up appointments scheduled with ortho specialist.  Benefits of [facility] explained.  Agrees to receiving information about program and consider involvement.  Plan: RN CM will send outreach letter with [facility] brochure and follow up on engagement within the next week.  Kemper Durie, [place], MSN [facility] Care Management  [facility] Manager [phone]

## 2021-01-11 DIAGNOSIS — M25561 Pain in right knee: Secondary | ICD-10-CM | POA: Diagnosis not present

## 2021-01-11 DIAGNOSIS — M25531 Pain in right wrist: Secondary | ICD-10-CM | POA: Diagnosis not present

## 2021-01-12 DIAGNOSIS — S2242XD Multiple fractures of ribs, left side, subsequent encounter for fracture with routine healing: Secondary | ICD-10-CM | POA: Diagnosis not present

## 2021-01-12 DIAGNOSIS — I110 Hypertensive heart disease with heart failure: Secondary | ICD-10-CM | POA: Diagnosis not present

## 2021-01-12 DIAGNOSIS — I5032 Chronic diastolic (congestive) heart failure: Secondary | ICD-10-CM | POA: Diagnosis not present

## 2021-01-12 DIAGNOSIS — S82141D Displaced bicondylar fracture of right tibia, subsequent encounter for closed fracture with routine healing: Secondary | ICD-10-CM | POA: Diagnosis not present

## 2021-01-12 DIAGNOSIS — S62101D Fracture of unspecified carpal bone, right wrist, subsequent encounter for fracture with routine healing: Secondary | ICD-10-CM | POA: Diagnosis not present

## 2021-01-13 DIAGNOSIS — S62101D Fracture of unspecified carpal bone, right wrist, subsequent encounter for fracture with routine healing: Secondary | ICD-10-CM | POA: Diagnosis not present

## 2021-01-13 DIAGNOSIS — I5032 Chronic diastolic (congestive) heart failure: Secondary | ICD-10-CM | POA: Diagnosis not present

## 2021-01-13 DIAGNOSIS — S82141D Displaced bicondylar fracture of right tibia, subsequent encounter for closed fracture with routine healing: Secondary | ICD-10-CM | POA: Diagnosis not present

## 2021-01-13 DIAGNOSIS — I110 Hypertensive heart disease with heart failure: Secondary | ICD-10-CM | POA: Diagnosis not present

## 2021-01-13 DIAGNOSIS — S2242XD Multiple fractures of ribs, left side, subsequent encounter for fracture with routine healing: Secondary | ICD-10-CM | POA: Diagnosis not present

## 2021-01-16 DIAGNOSIS — S2242XD Multiple fractures of ribs, left side, subsequent encounter for fracture with routine healing: Secondary | ICD-10-CM | POA: Diagnosis not present

## 2021-01-16 DIAGNOSIS — I5032 Chronic diastolic (congestive) heart failure: Secondary | ICD-10-CM | POA: Diagnosis not present

## 2021-01-16 DIAGNOSIS — I110 Hypertensive heart disease with heart failure: Secondary | ICD-10-CM | POA: Diagnosis not present

## 2021-01-16 DIAGNOSIS — S62101D Fracture of unspecified carpal bone, right wrist, subsequent encounter for fracture with routine healing: Secondary | ICD-10-CM | POA: Diagnosis not present

## 2021-01-16 DIAGNOSIS — S82141D Displaced bicondylar fracture of right tibia, subsequent encounter for closed fracture with routine healing: Secondary | ICD-10-CM | POA: Diagnosis not present

## 2021-01-17 ENCOUNTER — Other Ambulatory Visit: Payer: Self-pay | Admitting: *Deleted

## 2021-01-17 DIAGNOSIS — I110 Hypertensive heart disease with heart failure: Secondary | ICD-10-CM | POA: Diagnosis not present

## 2021-01-17 DIAGNOSIS — S62101D Fracture of unspecified carpal bone, right wrist, subsequent encounter for fracture with routine healing: Secondary | ICD-10-CM | POA: Diagnosis not present

## 2021-01-17 DIAGNOSIS — S2242XD Multiple fractures of ribs, left side, subsequent encounter for fracture with routine healing: Secondary | ICD-10-CM | POA: Diagnosis not present

## 2021-01-17 DIAGNOSIS — S82141D Displaced bicondylar fracture of right tibia, subsequent encounter for closed fracture with routine healing: Secondary | ICD-10-CM | POA: Diagnosis not present

## 2021-01-17 DIAGNOSIS — I5032 Chronic diastolic (congestive) heart failure: Secondary | ICD-10-CM | POA: Diagnosis not present

## 2021-01-17 NOTE — Patient Outreach (Signed)
  [facility]) Care Management  [facility] Care Manager  01/17/2021   AMBREA HEGLER [birth year] [id number]   Outgoing call placed to member to follow up on [facility] engagement.  Female answering phone state she has just started working with PT, request call back.  Call placed back to member, [facility] program explained to member and daughter.  They are interested in program but would like to research more before making a final decision.  Benefits of [facility] explained, brochure mailed, and advised to review [facility] website.  Agrees to consider.   Plan:  Follow-up: Will follow up on engagement decision within the next week.  Kemper Durie, [place], MSN [facility] Care Management  [facility] Manager [phone]

## 2021-01-18 DIAGNOSIS — S82141D Displaced bicondylar fracture of right tibia, subsequent encounter for closed fracture with routine healing: Secondary | ICD-10-CM | POA: Diagnosis not present

## 2021-01-18 DIAGNOSIS — I5032 Chronic diastolic (congestive) heart failure: Secondary | ICD-10-CM | POA: Diagnosis not present

## 2021-01-18 DIAGNOSIS — S2242XD Multiple fractures of ribs, left side, subsequent encounter for fracture with routine healing: Secondary | ICD-10-CM | POA: Diagnosis not present

## 2021-01-18 DIAGNOSIS — S62101D Fracture of unspecified carpal bone, right wrist, subsequent encounter for fracture with routine healing: Secondary | ICD-10-CM | POA: Diagnosis not present

## 2021-01-18 DIAGNOSIS — I110 Hypertensive heart disease with heart failure: Secondary | ICD-10-CM | POA: Diagnosis not present

## 2021-01-19 DIAGNOSIS — S2242XD Multiple fractures of ribs, left side, subsequent encounter for fracture with routine healing: Secondary | ICD-10-CM | POA: Diagnosis not present

## 2021-01-19 DIAGNOSIS — I110 Hypertensive heart disease with heart failure: Secondary | ICD-10-CM | POA: Diagnosis not present

## 2021-01-19 DIAGNOSIS — S82141D Displaced bicondylar fracture of right tibia, subsequent encounter for closed fracture with routine healing: Secondary | ICD-10-CM | POA: Diagnosis not present

## 2021-01-19 DIAGNOSIS — I5032 Chronic diastolic (congestive) heart failure: Secondary | ICD-10-CM | POA: Diagnosis not present

## 2021-01-19 DIAGNOSIS — S62101D Fracture of unspecified carpal bone, right wrist, subsequent encounter for fracture with routine healing: Secondary | ICD-10-CM | POA: Diagnosis not present

## 2021-01-23 DIAGNOSIS — S2242XD Multiple fractures of ribs, left side, subsequent encounter for fracture with routine healing: Secondary | ICD-10-CM | POA: Diagnosis not present

## 2021-01-23 DIAGNOSIS — M1711 Unilateral primary osteoarthritis, right knee: Secondary | ICD-10-CM | POA: Diagnosis not present

## 2021-01-23 DIAGNOSIS — I10 Essential (primary) hypertension: Secondary | ICD-10-CM | POA: Diagnosis not present

## 2021-01-23 DIAGNOSIS — S62101D Fracture of unspecified carpal bone, right wrist, subsequent encounter for fracture with routine healing: Secondary | ICD-10-CM | POA: Diagnosis not present

## 2021-01-23 DIAGNOSIS — E785 Hyperlipidemia, unspecified: Secondary | ICD-10-CM | POA: Diagnosis not present

## 2021-01-23 DIAGNOSIS — I110 Hypertensive heart disease with heart failure: Secondary | ICD-10-CM | POA: Diagnosis not present

## 2021-01-23 DIAGNOSIS — I2510 Atherosclerotic heart disease of native coronary artery without angina pectoris: Secondary | ICD-10-CM | POA: Diagnosis not present

## 2021-01-23 DIAGNOSIS — I5032 Chronic diastolic (congestive) heart failure: Secondary | ICD-10-CM | POA: Diagnosis not present

## 2021-01-23 DIAGNOSIS — E039 Hypothyroidism, unspecified: Secondary | ICD-10-CM | POA: Diagnosis not present

## 2021-01-23 DIAGNOSIS — S82141D Displaced bicondylar fracture of right tibia, subsequent encounter for closed fracture with routine healing: Secondary | ICD-10-CM | POA: Diagnosis not present

## 2021-01-24 DIAGNOSIS — S2242XD Multiple fractures of ribs, left side, subsequent encounter for fracture with routine healing: Secondary | ICD-10-CM | POA: Diagnosis not present

## 2021-01-24 DIAGNOSIS — I5032 Chronic diastolic (congestive) heart failure: Secondary | ICD-10-CM | POA: Diagnosis not present

## 2021-01-24 DIAGNOSIS — I110 Hypertensive heart disease with heart failure: Secondary | ICD-10-CM | POA: Diagnosis not present

## 2021-01-24 DIAGNOSIS — S62101D Fracture of unspecified carpal bone, right wrist, subsequent encounter for fracture with routine healing: Secondary | ICD-10-CM | POA: Diagnosis not present

## 2021-01-24 DIAGNOSIS — S82141D Displaced bicondylar fracture of right tibia, subsequent encounter for closed fracture with routine healing: Secondary | ICD-10-CM | POA: Diagnosis not present

## 2021-01-25 DIAGNOSIS — S82109A Unspecified fracture of upper end of unspecified tibia, initial encounter for closed fracture: Secondary | ICD-10-CM | POA: Diagnosis not present

## 2021-01-26 ENCOUNTER — Other Ambulatory Visit: Payer: Self-pay | Admitting: *Deleted

## 2021-01-26 DIAGNOSIS — I5032 Chronic diastolic (congestive) heart failure: Secondary | ICD-10-CM | POA: Diagnosis not present

## 2021-01-26 DIAGNOSIS — S2242XD Multiple fractures of ribs, left side, subsequent encounter for fracture with routine healing: Secondary | ICD-10-CM | POA: Diagnosis not present

## 2021-01-26 DIAGNOSIS — S82141D Displaced bicondylar fracture of right tibia, subsequent encounter for closed fracture with routine healing: Secondary | ICD-10-CM | POA: Diagnosis not present

## 2021-01-26 DIAGNOSIS — I110 Hypertensive heart disease with heart failure: Secondary | ICD-10-CM | POA: Diagnosis not present

## 2021-01-26 DIAGNOSIS — S62101D Fracture of unspecified carpal bone, right wrist, subsequent encounter for fracture with routine healing: Secondary | ICD-10-CM | POA: Diagnosis not present

## 2021-01-26 NOTE — Patient Outreach (Signed)
[facility]) Care Management  01/26/2021  Lynn Brown [birth year] [id number]   Outgoing call placed to member, spoke to daughter [name].  Report they are still considering involvement.  She will speak with member and have her call this care manager if she decides to engage.  Will await call back.  If no call back within the next week, will close case.  Kemper Durie, [place], MSN [facility] Care Management  [facility] Manager [phone]

## 2021-01-27 DIAGNOSIS — S82141D Displaced bicondylar fracture of right tibia, subsequent encounter for closed fracture with routine healing: Secondary | ICD-10-CM | POA: Diagnosis not present

## 2021-01-27 DIAGNOSIS — I5032 Chronic diastolic (congestive) heart failure: Secondary | ICD-10-CM | POA: Diagnosis not present

## 2021-01-27 DIAGNOSIS — S2242XD Multiple fractures of ribs, left side, subsequent encounter for fracture with routine healing: Secondary | ICD-10-CM | POA: Diagnosis not present

## 2021-01-27 DIAGNOSIS — S62101D Fracture of unspecified carpal bone, right wrist, subsequent encounter for fracture with routine healing: Secondary | ICD-10-CM | POA: Diagnosis not present

## 2021-01-27 DIAGNOSIS — I110 Hypertensive heart disease with heart failure: Secondary | ICD-10-CM | POA: Diagnosis not present

## 2021-01-30 DIAGNOSIS — S62101D Fracture of unspecified carpal bone, right wrist, subsequent encounter for fracture with routine healing: Secondary | ICD-10-CM | POA: Diagnosis not present

## 2021-01-30 DIAGNOSIS — I5032 Chronic diastolic (congestive) heart failure: Secondary | ICD-10-CM | POA: Diagnosis not present

## 2021-01-30 DIAGNOSIS — S82141D Displaced bicondylar fracture of right tibia, subsequent encounter for closed fracture with routine healing: Secondary | ICD-10-CM | POA: Diagnosis not present

## 2021-01-30 DIAGNOSIS — S2242XD Multiple fractures of ribs, left side, subsequent encounter for fracture with routine healing: Secondary | ICD-10-CM | POA: Diagnosis not present

## 2021-01-30 DIAGNOSIS — I110 Hypertensive heart disease with heart failure: Secondary | ICD-10-CM | POA: Diagnosis not present

## 2021-01-31 DIAGNOSIS — S2242XD Multiple fractures of ribs, left side, subsequent encounter for fracture with routine healing: Secondary | ICD-10-CM | POA: Diagnosis not present

## 2021-01-31 DIAGNOSIS — I110 Hypertensive heart disease with heart failure: Secondary | ICD-10-CM | POA: Diagnosis not present

## 2021-01-31 DIAGNOSIS — S62101D Fracture of unspecified carpal bone, right wrist, subsequent encounter for fracture with routine healing: Secondary | ICD-10-CM | POA: Diagnosis not present

## 2021-01-31 DIAGNOSIS — S82141D Displaced bicondylar fracture of right tibia, subsequent encounter for closed fracture with routine healing: Secondary | ICD-10-CM | POA: Diagnosis not present

## 2021-01-31 DIAGNOSIS — I5032 Chronic diastolic (congestive) heart failure: Secondary | ICD-10-CM | POA: Diagnosis not present

## 2021-02-02 ENCOUNTER — Other Ambulatory Visit: Payer: Self-pay

## 2021-02-02 ENCOUNTER — Telehealth: Payer: Self-pay | Admitting: Interventional Cardiology

## 2021-02-02 DIAGNOSIS — I110 Hypertensive heart disease with heart failure: Secondary | ICD-10-CM | POA: Diagnosis not present

## 2021-02-02 DIAGNOSIS — I5032 Chronic diastolic (congestive) heart failure: Secondary | ICD-10-CM | POA: Diagnosis not present

## 2021-02-02 DIAGNOSIS — S62101D Fracture of unspecified carpal bone, right wrist, subsequent encounter for fracture with routine healing: Secondary | ICD-10-CM | POA: Diagnosis not present

## 2021-02-02 DIAGNOSIS — S2242XD Multiple fractures of ribs, left side, subsequent encounter for fracture with routine healing: Secondary | ICD-10-CM | POA: Diagnosis not present

## 2021-02-02 DIAGNOSIS — S82141D Displaced bicondylar fracture of right tibia, subsequent encounter for closed fracture with routine healing: Secondary | ICD-10-CM | POA: Diagnosis not present

## 2021-02-02 MED ORDER — APIXABAN 5 MG PO TABS
5.0000 mg | ORAL_TABLET | Freq: Two times a day (BID) | ORAL | 6 refills | Status: DC
Start: 2021-02-02 — End: 2021-08-07

## 2021-02-02 NOTE — Telephone Encounter (Signed)
Left detailed message on Jim's VM letting him know that swelling is chronic for this pt.  Advised to call back if any questions.

## 2021-02-02 NOTE — Telephone Encounter (Signed)
Pt's daughter calling requesting a refill on Eliquis. Please Linde Gillis

## 2021-02-02 NOTE — Telephone Encounter (Signed)
Pt last saw Dr Katrinka Blazing 12/14/20, last labs 01/02/21 Creat 0.91, age 87, weight 85.3kg, based on specified criteria pt is on appropriate dosage of Eliquis 5mg  BID.  Will refill rx.

## 2021-02-02 NOTE — Telephone Encounter (Signed)
Left detailed message for Rosanne Ashing letting him know that pt should be taking the Spironolactone.

## 2021-02-02 NOTE — Telephone Encounter (Signed)
Rosanne Ashing would like to know if pt is supposed to be taking Spironolactone 25mg ? Please advise

## 2021-02-02 NOTE — Telephone Encounter (Signed)
New message:    Rosanne Ashing from [facility] psychotherapist to report plus one ankle sounds like chronic situation and want to know and confirm fluid attention. Please fax to [phone] or call [phone].

## 2021-02-03 ENCOUNTER — Other Ambulatory Visit: Payer: Self-pay | Admitting: *Deleted

## 2021-02-03 NOTE — Patient Outreach (Signed)
[facility]) Care Management  02/03/2021  Lynn Brown 12-04-1931 [id number]   No call from member/family to agree to [facility] involvement.  Will close case at this time.  Kemper Durie, [place], MSN [facility] Care Management  [facility] Manager [phone]

## 2021-02-04 DIAGNOSIS — Z86711 Personal history of pulmonary embolism: Secondary | ICD-10-CM | POA: Diagnosis not present

## 2021-02-06 DIAGNOSIS — S2242XD Multiple fractures of ribs, left side, subsequent encounter for fracture with routine healing: Secondary | ICD-10-CM | POA: Diagnosis not present

## 2021-02-06 DIAGNOSIS — S62101D Fracture of unspecified carpal bone, right wrist, subsequent encounter for fracture with routine healing: Secondary | ICD-10-CM | POA: Diagnosis not present

## 2021-02-06 DIAGNOSIS — S82141D Displaced bicondylar fracture of right tibia, subsequent encounter for closed fracture with routine healing: Secondary | ICD-10-CM | POA: Diagnosis not present

## 2021-02-06 DIAGNOSIS — I5032 Chronic diastolic (congestive) heart failure: Secondary | ICD-10-CM | POA: Diagnosis not present

## 2021-02-06 DIAGNOSIS — I110 Hypertensive heart disease with heart failure: Secondary | ICD-10-CM | POA: Diagnosis not present

## 2021-02-09 DIAGNOSIS — I110 Hypertensive heart disease with heart failure: Secondary | ICD-10-CM | POA: Diagnosis not present

## 2021-02-09 DIAGNOSIS — S2242XD Multiple fractures of ribs, left side, subsequent encounter for fracture with routine healing: Secondary | ICD-10-CM | POA: Diagnosis not present

## 2021-02-09 DIAGNOSIS — S82141D Displaced bicondylar fracture of right tibia, subsequent encounter for closed fracture with routine healing: Secondary | ICD-10-CM | POA: Diagnosis not present

## 2021-02-09 DIAGNOSIS — I5032 Chronic diastolic (congestive) heart failure: Secondary | ICD-10-CM | POA: Diagnosis not present

## 2021-02-09 DIAGNOSIS — S62101D Fracture of unspecified carpal bone, right wrist, subsequent encounter for fracture with routine healing: Secondary | ICD-10-CM | POA: Diagnosis not present

## 2021-02-10 ENCOUNTER — Other Ambulatory Visit: Payer: Self-pay | Admitting: Interventional Cardiology

## 2021-02-14 DIAGNOSIS — I110 Hypertensive heart disease with heart failure: Secondary | ICD-10-CM | POA: Diagnosis not present

## 2021-02-14 DIAGNOSIS — I5032 Chronic diastolic (congestive) heart failure: Secondary | ICD-10-CM | POA: Diagnosis not present

## 2021-02-14 DIAGNOSIS — S82141D Displaced bicondylar fracture of right tibia, subsequent encounter for closed fracture with routine healing: Secondary | ICD-10-CM | POA: Diagnosis not present

## 2021-02-14 DIAGNOSIS — S2242XD Multiple fractures of ribs, left side, subsequent encounter for fracture with routine healing: Secondary | ICD-10-CM | POA: Diagnosis not present

## 2021-02-14 DIAGNOSIS — S62101D Fracture of unspecified carpal bone, right wrist, subsequent encounter for fracture with routine healing: Secondary | ICD-10-CM | POA: Diagnosis not present

## 2021-02-15 DIAGNOSIS — Z9889 Other specified postprocedural states: Secondary | ICD-10-CM | POA: Diagnosis not present

## 2021-02-16 ENCOUNTER — Other Ambulatory Visit: Payer: Self-pay | Admitting: Physician Assistant

## 2021-02-16 DIAGNOSIS — I110 Hypertensive heart disease with heart failure: Secondary | ICD-10-CM | POA: Diagnosis not present

## 2021-02-16 DIAGNOSIS — S2242XD Multiple fractures of ribs, left side, subsequent encounter for fracture with routine healing: Secondary | ICD-10-CM | POA: Diagnosis not present

## 2021-02-16 DIAGNOSIS — S82141D Displaced bicondylar fracture of right tibia, subsequent encounter for closed fracture with routine healing: Secondary | ICD-10-CM | POA: Diagnosis not present

## 2021-02-16 DIAGNOSIS — I5032 Chronic diastolic (congestive) heart failure: Secondary | ICD-10-CM | POA: Diagnosis not present

## 2021-02-16 DIAGNOSIS — Z86711 Personal history of pulmonary embolism: Secondary | ICD-10-CM

## 2021-02-16 DIAGNOSIS — S62101D Fracture of unspecified carpal bone, right wrist, subsequent encounter for fracture with routine healing: Secondary | ICD-10-CM | POA: Diagnosis not present

## 2021-02-17 ENCOUNTER — Inpatient Hospital Stay: Payer: Medicare Other | Attending: Physician Assistant

## 2021-02-17 ENCOUNTER — Inpatient Hospital Stay (HOSPITAL_BASED_OUTPATIENT_CLINIC_OR_DEPARTMENT_OTHER): Payer: Medicare Other | Admitting: Physician Assistant

## 2021-02-17 ENCOUNTER — Other Ambulatory Visit: Payer: Self-pay

## 2021-02-17 VITALS — BP 157/83 | HR 64 | Temp 97.7°F | Resp 16 | Ht 61.0 in | Wt 185.0 lb

## 2021-02-17 DIAGNOSIS — I2699 Other pulmonary embolism without acute cor pulmonale: Secondary | ICD-10-CM | POA: Insufficient documentation

## 2021-02-17 DIAGNOSIS — Z86711 Personal history of pulmonary embolism: Secondary | ICD-10-CM

## 2021-02-17 DIAGNOSIS — Z7901 Long term (current) use of anticoagulants: Secondary | ICD-10-CM | POA: Diagnosis not present

## 2021-02-17 DIAGNOSIS — Z9181 History of falling: Secondary | ICD-10-CM | POA: Insufficient documentation

## 2021-02-17 DIAGNOSIS — I739 Peripheral vascular disease, unspecified: Secondary | ICD-10-CM | POA: Insufficient documentation

## 2021-02-17 LAB — CBC WITH DIFFERENTIAL (CANCER CENTER ONLY)
Abs Immature Granulocytes: 0.01 10*3/uL (ref 0.00–0.07)
Basophils Absolute: 0 10*3/uL (ref 0.0–0.1)
Basophils Relative: 1 %
Eosinophils Absolute: 0.1 10*3/uL (ref 0.0–0.5)
Eosinophils Relative: 2 %
HCT: 39.1 % (ref 36.0–46.0)
Hemoglobin: 12.9 g/dL (ref 12.0–15.0)
Immature Granulocytes: 0 %
Lymphocytes Relative: 44 %
Lymphs Abs: 1.6 10*3/uL (ref 0.7–4.0)
MCH: 30.6 pg (ref 26.0–34.0)
MCHC: 33 g/dL (ref 30.0–36.0)
MCV: 92.7 fL (ref 80.0–100.0)
Monocytes Absolute: 0.4 10*3/uL (ref 0.1–1.0)
Monocytes Relative: 12 %
Neutro Abs: 1.5 10*3/uL — ABNORMAL LOW (ref 1.7–7.7)
Neutrophils Relative %: 41 %
Platelet Count: 195 10*3/uL (ref 150–400)
RBC: 4.22 MIL/uL (ref 3.87–5.11)
RDW: 13.6 % (ref 11.5–15.5)
WBC Count: 3.6 10*3/uL — ABNORMAL LOW (ref 4.0–10.5)
nRBC: 0 % (ref 0.0–0.2)

## 2021-02-17 LAB — CMP (CANCER CENTER ONLY)
ALT: 12 U/L (ref 0–44)
AST: 12 U/L — ABNORMAL LOW (ref 15–41)
Albumin: 3.8 g/dL (ref 3.5–5.0)
Alkaline Phosphatase: 76 U/L (ref 38–126)
Anion gap: 9 (ref 5–15)
BUN: 22 mg/dL (ref 8–23)
CO2: 25 mmol/L (ref 22–32)
Calcium: 10.1 mg/dL (ref 8.9–10.3)
Chloride: 106 mmol/L (ref 98–111)
Creatinine: 0.99 mg/dL (ref 0.44–1.00)
GFR, Estimated: 55 mL/min — ABNORMAL LOW (ref >60)
Glucose, Bld: 98 mg/dL (ref 70–99)
Potassium: 4.3 mmol/L (ref 3.5–5.1)
Sodium: 140 mmol/L (ref 135–145)
Total Bilirubin: 0.5 mg/dL (ref 0.3–1.2)
Total Protein: 7.7 g/dL (ref 6.5–8.1)

## 2021-02-17 NOTE — Progress Notes (Signed)
[facility] [phone]   [phone]  PROGRESS NOTE  Patient Care Team: Laurann Montana, MD as PCP - General (Family Medicine) Lyn Records, MD as PCP - Cardiology (Cardiology)  Hematological/Oncological History 1) 08/09/2020-08/12/2020:  -Admitted for extensive pulmonary emboli in the right lung -Echo:EF 55-60%, RVSP 77 mmHg-RV systolic function mildly reduced. -Bilateral LE doppler US: No DVT -IV heparin x 48 hours and then transitioned to Eliquis  2) 08/17/2020: Establish care with Georga Kaufmann PA-C -Hypercoagulable workup was negative for antiphospholipid antibody syndrome, prothrombin gene mutation, factor V Leiden mutation, protein C and S deficiency.  -Recommend indefinite anticoagulation due to unprovoked PE   CHIEF COMPLAINT: Pulmonary emboli currently on Eliquis  HISTORY OF PRESENTING ILLNESS:  Lynn Brown 87 y.o. female returns for a follow up for history of pulmonary emboli, currently on eliquis. She was last seen in clinic on 11/18/2020. In the interim, patient fell and fractured two left ribs, right wrist and right tibia. She was admitted from 86/2022-01/05/2021.   Today, Ms.Rothenberger reports that she is recovering from her fall. She is ambulating using a walker. She denies any subsequent falls. Her energy levels are overall the same. Patient has a good appetite without any noticeable weight changes.  She denies any nausea, vomiting or abdominal pain.  Patient has regular bowel movements without any diarrhea or constipation.  Patient has chronic lower extremity edema secondary to PAD. She is tolerating Eliquis without any toxicities. She denies easy bruising or signs of bleeding. Patient denies any fevers, chills, chest pain or cough.  She has no other complaints.  Rest of the 10 point ROS is low.  MEDICAL HISTORY:  Past Medical History:  Diagnosis Date   Allergic rhinitis    Anxiety    Bunion    PES PLANUS   Hearing loss    HEARING  AIDS, BUT DOES NOT WEAR THEM CONSISTENTLY.    Hypercholesteremia    Hypertension    Hypothyroid    Leaky heart valve    DR. HARWANI   Mild obesity    Osteoarthritis of knees, bilateral    Osteoporosis 12/2017   T score -3.3   Venous insufficiency     SURGICAL HISTORY: Past Surgical History:  Procedure Laterality Date   ABDOMINAL AORTOGRAM W/LOWER EXTREMITY Left 05/04/2020   Procedure: ABDOMINAL AORTOGRAM W/LOWER EXTREMITY;  Surgeon: Leonie Douglas, MD;  Location: [facility] INVASIVE CV LAB;  Service: Cardiovascular;  Laterality: Left;   CATARACT EXTRACTION, BILATERAL  2013   DR. SHAPIRO    HYSTEROSCOPY     D & C   LEFT HEART CATH AND CORONARY ANGIOGRAPHY N/A 03/10/2018   Procedure: LEFT HEART CATH AND CORONARY ANGIOGRAPHY;  Surgeon: Lyn Records, MD;  Location: [facility] INVASIVE CV LAB;  Service: Cardiovascular;  Laterality: N/A;   NEPHRECTOMY LIVING DONOR  1978   PERIPHERAL VASCULAR INTERVENTION Left 05/04/2020   Procedure: PERIPHERAL VASCULAR INTERVENTION;  Surgeon: Leonie Douglas, MD;  Location: [facility] INVASIVE CV LAB;  Service: Cardiovascular;  Laterality: Left;  Femoral popliteal    SOCIAL HISTORY: Social History   Socioeconomic History   Marital status: Widowed    Spouse name: Not on file   Number of children: 4   Years of education: Not on file   Highest education level: Not on file  Occupational History   Occupation: RETIRED  Tobacco Use   Smoking status: Never   Smokeless tobacco: Never  Vaping Use   Vaping Use: Never used  Substance and Sexual Activity  Alcohol use: No    Alcohol/week: 0.0 standard drinks   Drug use: No   Sexual activity: Never    Comment: 1st intercourse 87 yo-Fewer than 5 partners  Other Topics Concern   Not on file  Social History Narrative   Not on file   Social Determinants of Health   Financial Resource Strain: Not on file  Food Insecurity: Not on file  Transportation Needs: Not on file  Physical Activity: Not on file  Stress: Not on  file  Social Connections: Not on file  Intimate Partner Violence: Not on file    FAMILY HISTORY: Family History  Problem Relation Age of Onset   Hypertension Mother    CVA Mother    Diabetes Sister    Hypertension Sister    Heart disease Sister    Multiple sclerosis Sister    Hypertension Father    CVA Father    Cancer Brother        prostrate   Hypertension Brother    CVA Brother    Seizures Brother    Pancreatic cancer Sister    Kidney disease Sister    Kidney failure Sister    Other Brother        PNEUMONIA AS A BABY   Hypertension Brother    Lymphoma Daughter    Deep vein thrombosis Sister    Pulmonary embolism Niece    Breast cancer Neg Hx     ALLERGIES:  has No Known Allergies.  MEDICATIONS:  Current Outpatient Medications  Medication Sig Dispense Refill   Acetaminophen 500 MG capsule Take 500 mg by mouth every 6 (six) hours as needed for pain.     apixaban (ELIQUIS) 5 MG TABS tablet Take 1 tablet (5 mg total) by mouth 2 (two) times daily. 60 tablet 6   ascorbic acid (VITAMIN C) 500 MG tablet Take 500 mg by mouth in the morning and at bedtime.     aspirin EC 81 MG tablet Take 1 tablet (81 mg total) by mouth daily. Swallow whole. 90 tablet 3   Calcium Citrate-Vitamin D 315-250 MG-UNIT TABS Take 1 tablet by mouth daily.     cetirizine (ZYRTEC) 10 MG tablet Take 10 mg by mouth daily as needed (allergies.).      clorazepate (TRANXENE) 7.5 MG tablet Take 7.5 mg by mouth daily as needed for anxiety.      Coenzyme Q10 (COQ10 PO) Take 1 capsule by mouth daily at 2 PM.     furosemide (LASIX) 20 MG tablet TAKE 1 TABLET BY MOUTH AS NEEDED FOR FLUID OR EDEMA 30 tablet 11   levothyroxine (SYNTHROID) 112 MCG tablet Take 112 mcg by mouth every morning.     magnesium oxide (MAG-OX) 400 MG tablet Take 400 mg by mouth every evening.      methocarbamol (ROBAXIN) 500 MG tablet Take 1 tablet (500 mg total) by mouth every 6 (six) hours as needed for muscle spasms. 45 tablet 0    metoprolol tartrate (LOPRESSOR) 25 MG tablet Take 0.5 tablets (12.5 mg total) by mouth 2 (two) times daily. 60 tablet 0   nitroGLYCERIN (NITROSTAT) 0.4 MG SL tablet Place 1 tablet (0.4 mg total) under the tongue every 5 (five) minutes as needed for chest pain. 30 tablet 0   oxyCODONE (OXY IR/ROXICODONE) 5 MG immediate release tablet Take 1-2 tablets (5-10 mg total) by mouth every 6 (six) hours as needed for breakthrough pain. 20 tablet 0   rosuvastatin (CRESTOR) 20 MG tablet Take 20 mg by  mouth every evening.      spironolactone (ALDACTONE) 25 MG tablet TAKE 1 TABLET BY MOUTH  DAILY 90 tablet 3   VITAMIN D PO Take 1 tablet by mouth daily at 2 PM.     No current facility-administered medications for this visit.    REVIEW OF SYSTEMS:   Constitutional: ( - ) fevers, ( - )  chills , ( - ) night sweats Eyes: ( - ) blurriness of vision, ( - ) double vision, ( - ) watery eyes Ears, nose, mouth, throat, and face: ( - ) mucositis, ( - ) sore throat Respiratory: ( - ) cough, ( - ) dyspnea, ( - ) wheezes Cardiovascular: ( - ) palpitation, ( - ) chest discomfort, ( + ) lower extremity swelling Gastrointestinal:  ( - ) nausea, ( - ) heartburn, ( - ) change in bowel habits Skin: ( - ) abnormal skin rashes Lymphatics: ( - ) new lymphadenopathy, ( - ) easy bruising Neurological: ( - ) numbness, ( - ) tingling, ( - ) new weaknesses Behavioral/Psych: ( - ) mood change, ( - ) new changes  All other systems were reviewed with the patient and are negative.  PHYSICAL EXAMINATION: ECOG PERFORMANCE STATUS: 1 - Symptomatic but completely ambulatory  Vitals:   02/17/21 1042  BP: (!) 157/83  Pulse: 64  Resp: 16  Temp: 97.7 F (36.5 C)  SpO2: 98%    Filed Weights   02/17/21 1042  Weight: 185 lb (83.9 kg)     GENERAL: well appearing female in NAD  SKIN: skin color, texture, turgor are normal, no rashes or significant lesions EYES: conjunctiva are pink and non-injected, sclera clear OROPHARYNX: no  exudate, no erythema; lips, buccal mucosa, and tongue normal  NECK: supple, non-tender LYMPH:  no palpable lymphadenopathy in the cervical, axillary or supraclavicular lymph nodes.  LUNGS: clear to auscultation and percussion with normal breathing effort HEART: regular rate & rhythm and no murmurs. +bilateral lower extremity edema with darkening pigmentation of skin.  ABDOMEN: soft, non-tender, non-distended, normal bowel sounds Musculoskeletal: no cyanosis of digits and no clubbing  PSYCH: alert & oriented x 3, fluent speech NEURO: no focal motor/sensory deficits  LABORATORY DATA:  I have reviewed the data as listed CBC Latest Ref Rng & Units 02/17/2021 01/02/2021 01/01/2021  WBC 4.0 - 10.5 K/uL 3.6(L) 5.0 4.9  Hemoglobin 12.0 - 15.0 g/dL 19.6 22.2 11.8(L)  Hematocrit 36.0 - 46.0 % 39.1 40.6 36.2  Platelets 150 - 400 K/uL 195 156 156    CMP Latest Ref Rng & Units 02/17/2021 01/02/2021 01/01/2021  Glucose 70 - 99 mg/dL 98 979(G) 921(J)  BUN 8 - 23 mg/dL 22 15 19   Creatinine 0.44 - 1.00 mg/dL 9.41 7.40  Sodium 135 - 145 mmol/L 140 136 136  Potassium 3.5 - 5.1 mmol/L 4.3 4.5 4.4  Chloride 98 - 111 mmol/L 106 104 106  CO2 22 - 32 mmol/L 25 24 21(L)  Calcium 8.9 - 10.3 mg/dL 8.14 9.0 8.9  Total Protein 6.5 - 8.1 g/dL 7.7 - -  Total Bilirubin 0.3 - 1.2 mg/dL 0.5 - -  Alkaline Phos 38 - 126 U/L 76 - -  AST 15 - 41 U/L 12(L) - -  ALT 0 - 44 U/L 12 - -   RADIOGRAPHIC STUDIES: I have personally reviewed the radiological images as listed and agreed with the findings in the report. No results found.   ASSESSMENT & PLAN LUCIANNE SMESTAD is a 87 y.o. female  presenting to the clinic for  a follow up while on Eliquis following submassive pulmonary emboli involving the right lung.   #Pulmonary emboli in the right lung, unprovoked:  --CTA chest from 08/09/2020 revealed extensive pulmonary emboli within the right lung.  --Echo from 08/10/2020 :EF 55-60%, RVSP 77 mmHg-RV systolic function mildly  reduced. --Bilateral LE doppler US from 3/16/022: No DVT -Prior hypercoagulable workup from 08/17/2020 was negative for antiphospholipid antibody syndrome, prothrombin gene mutation, factor V Leiden mutation, protein C and S deficiency.  --Currently on Eliquis 5 mg BID with good toleration. Since PE was unprovoked, recommendation is indefinite anticoagulation.  --It has been 6 months since initiating of Eliquis so she is eligible for maintenance dose at 2.5 mg BID.  --Labs today were reviewed that required no intervention. --Patient can return to the clinic on an as needed basis.   #Peripheral vascular disease --Underwent SFA/popliteal artery stenting on 04/2020 --Vascular surgery recommend to stop Plavix and continue with aspirin.  No orders of the defined types were placed in this encounter.   All questions were answered. The patient knows to call the clinic with any problems, questions or concerns.  I have spent a total of 25 minutes minutes of face-to-face and non-face-to-face time, preparing to see the patient, performing a medically appropriate examination, counseling and educating the patient, documenting clinical information in the electronic health record, and care coordination.   Georga Kaufmann, PA-C Department of Hematology/Oncology [facility] at [facility] Phone: [phone]

## 2021-02-23 ENCOUNTER — Encounter (INDEPENDENT_AMBULATORY_CARE_PROVIDER_SITE_OTHER): Payer: Self-pay | Admitting: Ophthalmology

## 2021-02-23 ENCOUNTER — Ambulatory Visit (INDEPENDENT_AMBULATORY_CARE_PROVIDER_SITE_OTHER): Payer: Medicare Other | Admitting: Ophthalmology

## 2021-02-23 ENCOUNTER — Other Ambulatory Visit: Payer: Self-pay

## 2021-02-23 DIAGNOSIS — H33101 Unspecified retinoschisis, right eye: Secondary | ICD-10-CM

## 2021-02-23 DIAGNOSIS — H43821 Vitreomacular adhesion, right eye: Secondary | ICD-10-CM

## 2021-02-23 NOTE — Progress Notes (Signed)
02/23/2021     CHIEF COMPLAINT Patient presents for  Chief Complaint  Patient presents with   Retina Follow Up      HISTORY OF PRESENT ILLNESS: Lynn Brown is a 87 y.o. female who presents to the clinic today for:   HPI     Retina Follow Up   Patient presents with  Other.  In both eyes.  This started 6 months ago.  Duration of 6 months.        Comments   6 month f/u OU with OCT  Pt denies any visual changes since previous visit. Pt denies any new flashes or floaters. Pt denies any eye pain.   EyeMeds: None      Last edited by Frederik Pear, COA on 02/23/2021 10:35 AM.      Referring physician: Jethro Bolus, MD [address] [place],  [place] [address]  HISTORICAL INFORMATION:   Selected notes from the MEDICAL RECORD NUMBER       CURRENT MEDICATIONS: No current outpatient medications on file. (Ophthalmic Drugs)   No current facility-administered medications for this visit. (Ophthalmic Drugs)   Current Outpatient Medications (Other)  Medication Sig   Acetaminophen 500 MG capsule Take 500 mg by mouth every 6 (six) hours as needed for pain.   apixaban (ELIQUIS) 5 MG TABS tablet Take 1 tablet (5 mg total) by mouth 2 (two) times daily.   ascorbic acid (VITAMIN C) 500 MG tablet Take 500 mg by mouth in the morning and at bedtime.   aspirin EC 81 MG tablet Take 1 tablet (81 mg total) by mouth daily. Swallow whole.   Calcium Citrate-Vitamin D 315-250 MG-UNIT TABS Take 1 tablet by mouth daily.   cetirizine (ZYRTEC) 10 MG tablet Take 10 mg by mouth daily as needed (allergies.).    clorazepate (TRANXENE) 7.5 MG tablet Take 7.5 mg by mouth daily as needed for anxiety.    Coenzyme Q10 (COQ10 PO) Take 1 capsule by mouth daily at 2 PM.   furosemide (LASIX) 20 MG tablet TAKE 1 TABLET BY MOUTH AS NEEDED FOR FLUID OR EDEMA   levothyroxine (SYNTHROID) 112 MCG tablet Take 112 mcg by mouth every morning.   magnesium oxide (MAG-OX) 400 MG tablet Take 400 mg by mouth  every evening.    methocarbamol (ROBAXIN) 500 MG tablet Take 1 tablet (500 mg total) by mouth every 6 (six) hours as needed for muscle spasms.   metoprolol tartrate (LOPRESSOR) 25 MG tablet Take 0.5 tablets (12.5 mg total) by mouth 2 (two) times daily.   nitroGLYCERIN (NITROSTAT) 0.4 MG SL tablet Place 1 tablet (0.4 mg total) under the tongue every 5 (five) minutes as needed for chest pain.   oxyCODONE (OXY IR/ROXICODONE) 5 MG immediate release tablet Take 1-2 tablets (5-10 mg total) by mouth every 6 (six) hours as needed for breakthrough pain.   rosuvastatin (CRESTOR) 20 MG tablet Take 20 mg by mouth every evening.    spironolactone (ALDACTONE) 25 MG tablet TAKE 1 TABLET BY MOUTH  DAILY   VITAMIN D PO Take 1 tablet by mouth daily at 2 PM.   No current facility-administered medications for this visit. (Other)      REVIEW OF SYSTEMS:    ALLERGIES No Known Allergies  PAST MEDICAL HISTORY Past Medical History:  Diagnosis Date   Allergic rhinitis    Anxiety    Bunion    PES PLANUS   Hearing loss    HEARING AIDS, BUT DOES NOT WEAR THEM CONSISTENTLY.  Hypercholesteremia    Hypertension    Hypothyroid    Leaky heart valve    DR. HARWANI   Mild obesity    Osteoarthritis of knees, bilateral    Osteoporosis 12/2017   T score -3.3   Venous insufficiency    Vitreomacular traction, right 04/14/2020   Vitrectomy 08-03-20   Past Surgical History:  Procedure Laterality Date   ABDOMINAL AORTOGRAM W/LOWER EXTREMITY Left 05/04/2020   Procedure: ABDOMINAL AORTOGRAM W/LOWER EXTREMITY;  Surgeon: Leonie Douglas, MD;  Location: [facility];  Service: Cardiovascular;  Laterality: Left;   CATARACT EXTRACTION, BILATERAL  2013   DR. SHAPIRO    HYSTEROSCOPY     D & C   LEFT HEART CATH AND CORONARY ANGIOGRAPHY N/A 03/10/2018   Procedure: LEFT HEART CATH AND CORONARY ANGIOGRAPHY;  Surgeon: Lyn Records, MD;  Location: [facility];  Service: Cardiovascular;  Laterality: N/A;    NEPHRECTOMY LIVING DONOR  1978   PERIPHERAL VASCULAR INTERVENTION Left 05/04/2020   Procedure: PERIPHERAL VASCULAR INTERVENTION;  Surgeon: Leonie Douglas, MD;  Location: [facility];  Service: Cardiovascular;  Laterality: Left;  Femoral popliteal    FAMILY HISTORY Family History  Problem Relation Age of Onset   Hypertension Mother    CVA Mother    Diabetes Sister    Hypertension Sister    Heart disease Sister    Multiple sclerosis Sister    Hypertension Father    CVA Father    Cancer Brother        prostrate   Hypertension Brother    CVA Brother    Seizures Brother    Pancreatic cancer Sister    Kidney disease Sister    Kidney failure Sister    Other Brother        PNEUMONIA AS A BABY   Hypertension Brother    Lymphoma Daughter    Deep vein thrombosis Sister    Pulmonary embolism Niece    Breast cancer Neg Hx     SOCIAL HISTORY Social History   Tobacco Use   Smoking status: Never   Smokeless tobacco: Never  Vaping Use   Vaping Use: Never used  Substance Use Topics   Alcohol use: No    Alcohol/week: 0.0 standard drinks   Drug use: No         OPHTHALMIC EXAM:  Base Eye Exam     Visual Acuity (ETDRS)       Right Left   Dist cc 20/50 +1 20/25 -1   Dist ph cc 20/50 +2     Correction: Glasses         Tonometry (Tonopen, 10:44 AM)       Right Left   Pressure 9 8         Pupils       Pupils Dark Light Shape React APD   Right PERRL 6 4 Round Brisk None   Left PERRL 6 4 Round Brisk None         Visual Fields (Counting fingers)       Left Right    Full Full         Extraocular Movement       Right Left    Full, Ortho Full, Ortho         Neuro/Psych     Oriented x3: Yes   Mood/Affect: Normal         Dilation     Both eyes: 1.0% Mydriacyl, 2.5% Phenylephrine @ 10:44  AM           Slit Lamp and Fundus Exam     External Exam       Right Left   External Normal Normal         Slit Lamp Exam        Right Left   Lids/Lashes Normal Normal   Conjunctiva/Sclera White and quiet White and quiet   Cornea Clear Clear   Anterior Chamber Deep and quiet Deep and quiet   Iris Round and reactive Round and reactive   Lens Posterior chamber intraocular lens Posterior chamber intraocular lens   Anterior Vitreous Normal Normal         Fundus Exam       Right Left   Posterior Vitreous Clear, avitric Normal   Disc Normal Normal   C/D Ratio 0.5 0.5   Macula Normal Normal   Vessels Normal Normal   Periphery Normal Normal            IMAGING AND PROCEDURES  Imaging and Procedures for 02/23/21  OCT, Retina - OU - Both Eyes       Right Eye Quality was good. Scan locations included subfoveal. Central Foveal Thickness: 250. Progression has improved.   Left Eye Quality was good. Scan locations included subfoveal. Central Foveal Thickness: 276. Progression has been stable. Findings include normal observations, vitreous traction.   Notes No residual vitreal macular traction, no residual foveal macular schisis OD  OS with VMT with no inner foveal distortion of the retina will observe             ASSESSMENT/PLAN:  Vitreomacular traction, right Condition resolved OD  Macular retinoschisis, right Condition improved OD post vitrectomy     ICD-10-CM   1. Vitreomacular traction, right  H43.821 OCT, Retina - OU - Both Eyes    2. Macular retinoschisis, right  H33.101       1.  OD, 6 months post vitrectomy for vitreal macular traction with secondary inner foveal retinal schisis.  Stable and slightly improved acuity over time.  2.  OS with residual vitreomacular adhesion and slight traction on the inner retinoschisis development.  With good acuity will recommend observe OS  3.  Follow-up with Dr. Jethro Bolus as scheduled  Ophthalmic Meds Ordered this visit:  No orders of the defined types were placed in this encounter.      Return in about 1 year (around 02/23/2022) for  DILATE OU, OCT.  There are no Patient Instructions on file for this visit.   Explained the diagnoses, plan, and follow up with the patient and they expressed understanding.  Patient expressed understanding of the importance of proper follow up care.   Alford Highland Caylen Kuwahara M.D. Diseases & Surgery of the Retina and Vitreous [facility] 02/23/21     Abbreviations: M myopia (nearsighted); A astigmatism; H hyperopia (farsighted); P presbyopia; Mrx spectacle prescription;  CTL contact lenses; OD right eye; OS left eye; OU both eyes  XT exotropia; ET esotropia; PEK punctate epithelial keratitis; PEE punctate epithelial erosions; DES dry eye syndrome; MGD meibomian gland dysfunction; ATs artificial tears; PFAT's preservative free artificial tears; NSC nuclear sclerotic cataract; PSC posterior subcapsular cataract; ERM epi-retinal membrane; PVD posterior vitreous detachment; RD retinal detachment; DM diabetes mellitus; DR diabetic retinopathy; NPDR non-proliferative diabetic retinopathy; PDR proliferative diabetic retinopathy; CSME clinically significant macular edema; DME diabetic macular edema; dbh dot blot hemorrhages; CWS cotton wool spot; POAG primary open angle glaucoma; C/D cup-to-disc ratio; HVF humphrey  visual field; GVF goldmann visual field; OCT optical coherence tomography; IOP intraocular pressure; BRVO Branch retinal vein occlusion; CRVO central retinal vein occlusion; CRAO central retinal artery occlusion; BRAO branch retinal artery occlusion; RT retinal tear; SB scleral buckle; PPV pars plana vitrectomy; VH Vitreous hemorrhage; PRP panretinal laser photocoagulation; IVK intravitreal kenalog; VMT vitreomacular traction; MH Macular hole;  NVD neovascularization of the disc; NVE neovascularization elsewhere; AREDS age related eye disease study; ARMD age related macular degeneration; POAG primary open angle glaucoma; EBMD epithelial/anterior basement membrane dystrophy; ACIOL anterior  chamber intraocular lens; IOL intraocular lens; PCIOL posterior chamber intraocular lens; Phaco/IOL phacoemulsification with intraocular lens placement; PRK photorefractive keratectomy; LASIK laser assisted in situ keratomileusis; HTN hypertension; DM diabetes mellitus; COPD chronic obstructive pulmonary disease

## 2021-02-23 NOTE — Assessment & Plan Note (Signed)
Condition resolved OD 

## 2021-02-23 NOTE — Assessment & Plan Note (Signed)
Condition improved OD post vitrectomy

## 2021-02-24 DIAGNOSIS — S62101D Fracture of unspecified carpal bone, right wrist, subsequent encounter for fracture with routine healing: Secondary | ICD-10-CM | POA: Diagnosis not present

## 2021-02-24 DIAGNOSIS — S82141D Displaced bicondylar fracture of right tibia, subsequent encounter for closed fracture with routine healing: Secondary | ICD-10-CM | POA: Diagnosis not present

## 2021-02-24 DIAGNOSIS — S2242XD Multiple fractures of ribs, left side, subsequent encounter for fracture with routine healing: Secondary | ICD-10-CM | POA: Diagnosis not present

## 2021-02-24 DIAGNOSIS — S82109A Unspecified fracture of upper end of unspecified tibia, initial encounter for closed fracture: Secondary | ICD-10-CM | POA: Diagnosis not present

## 2021-02-24 DIAGNOSIS — I5032 Chronic diastolic (congestive) heart failure: Secondary | ICD-10-CM | POA: Diagnosis not present

## 2021-02-24 DIAGNOSIS — I110 Hypertensive heart disease with heart failure: Secondary | ICD-10-CM | POA: Diagnosis not present

## 2021-03-01 DIAGNOSIS — I5032 Chronic diastolic (congestive) heart failure: Secondary | ICD-10-CM | POA: Diagnosis not present

## 2021-03-01 DIAGNOSIS — S82141D Displaced bicondylar fracture of right tibia, subsequent encounter for closed fracture with routine healing: Secondary | ICD-10-CM | POA: Diagnosis not present

## 2021-03-01 DIAGNOSIS — I110 Hypertensive heart disease with heart failure: Secondary | ICD-10-CM | POA: Diagnosis not present

## 2021-03-01 DIAGNOSIS — S2242XD Multiple fractures of ribs, left side, subsequent encounter for fracture with routine healing: Secondary | ICD-10-CM | POA: Diagnosis not present

## 2021-03-01 DIAGNOSIS — S62101D Fracture of unspecified carpal bone, right wrist, subsequent encounter for fracture with routine healing: Secondary | ICD-10-CM | POA: Diagnosis not present

## 2021-03-06 DIAGNOSIS — Z86711 Personal history of pulmonary embolism: Secondary | ICD-10-CM | POA: Diagnosis not present

## 2021-03-08 DIAGNOSIS — I5032 Chronic diastolic (congestive) heart failure: Secondary | ICD-10-CM | POA: Diagnosis not present

## 2021-03-08 DIAGNOSIS — S2242XD Multiple fractures of ribs, left side, subsequent encounter for fracture with routine healing: Secondary | ICD-10-CM | POA: Diagnosis not present

## 2021-03-08 DIAGNOSIS — S62101D Fracture of unspecified carpal bone, right wrist, subsequent encounter for fracture with routine healing: Secondary | ICD-10-CM | POA: Diagnosis not present

## 2021-03-08 DIAGNOSIS — I110 Hypertensive heart disease with heart failure: Secondary | ICD-10-CM | POA: Diagnosis not present

## 2021-03-08 DIAGNOSIS — S82141D Displaced bicondylar fracture of right tibia, subsequent encounter for closed fracture with routine healing: Secondary | ICD-10-CM | POA: Diagnosis not present

## 2021-03-09 DIAGNOSIS — S82141D Displaced bicondylar fracture of right tibia, subsequent encounter for closed fracture with routine healing: Secondary | ICD-10-CM | POA: Diagnosis not present

## 2021-03-09 DIAGNOSIS — S2242XD Multiple fractures of ribs, left side, subsequent encounter for fracture with routine healing: Secondary | ICD-10-CM | POA: Diagnosis not present

## 2021-03-09 DIAGNOSIS — M5030 Other cervical disc degeneration, unspecified cervical region: Secondary | ICD-10-CM | POA: Diagnosis not present

## 2021-03-09 DIAGNOSIS — I5032 Chronic diastolic (congestive) heart failure: Secondary | ICD-10-CM | POA: Diagnosis not present

## 2021-03-09 DIAGNOSIS — S62101D Fracture of unspecified carpal bone, right wrist, subsequent encounter for fracture with routine healing: Secondary | ICD-10-CM | POA: Diagnosis not present

## 2021-03-09 DIAGNOSIS — M4802 Spinal stenosis, cervical region: Secondary | ICD-10-CM | POA: Diagnosis not present

## 2021-03-09 DIAGNOSIS — M47812 Spondylosis without myelopathy or radiculopathy, cervical region: Secondary | ICD-10-CM | POA: Diagnosis not present

## 2021-03-09 DIAGNOSIS — M170 Bilateral primary osteoarthritis of knee: Secondary | ICD-10-CM | POA: Diagnosis not present

## 2021-03-09 DIAGNOSIS — M810 Age-related osteoporosis without current pathological fracture: Secondary | ICD-10-CM | POA: Diagnosis not present

## 2021-03-09 DIAGNOSIS — I110 Hypertensive heart disease with heart failure: Secondary | ICD-10-CM | POA: Diagnosis not present

## 2021-03-09 DIAGNOSIS — I872 Venous insufficiency (chronic) (peripheral): Secondary | ICD-10-CM | POA: Diagnosis not present

## 2021-03-15 DIAGNOSIS — Z86711 Personal history of pulmonary embolism: Secondary | ICD-10-CM | POA: Diagnosis not present

## 2021-03-15 DIAGNOSIS — M170 Bilateral primary osteoarthritis of knee: Secondary | ICD-10-CM | POA: Diagnosis not present

## 2021-03-15 DIAGNOSIS — I872 Venous insufficiency (chronic) (peripheral): Secondary | ICD-10-CM | POA: Diagnosis not present

## 2021-03-15 DIAGNOSIS — S82141D Displaced bicondylar fracture of right tibia, subsequent encounter for closed fracture with routine healing: Secondary | ICD-10-CM | POA: Diagnosis not present

## 2021-03-15 DIAGNOSIS — E039 Hypothyroidism, unspecified: Secondary | ICD-10-CM | POA: Diagnosis not present

## 2021-03-15 DIAGNOSIS — M47812 Spondylosis without myelopathy or radiculopathy, cervical region: Secondary | ICD-10-CM | POA: Diagnosis not present

## 2021-03-15 DIAGNOSIS — I5032 Chronic diastolic (congestive) heart failure: Secondary | ICD-10-CM | POA: Diagnosis not present

## 2021-03-15 DIAGNOSIS — Z7901 Long term (current) use of anticoagulants: Secondary | ICD-10-CM | POA: Diagnosis not present

## 2021-03-15 DIAGNOSIS — E7800 Pure hypercholesterolemia, unspecified: Secondary | ICD-10-CM | POA: Diagnosis not present

## 2021-03-15 DIAGNOSIS — M4802 Spinal stenosis, cervical region: Secondary | ICD-10-CM | POA: Diagnosis not present

## 2021-03-15 DIAGNOSIS — S2242XD Multiple fractures of ribs, left side, subsequent encounter for fracture with routine healing: Secondary | ICD-10-CM | POA: Diagnosis not present

## 2021-03-15 DIAGNOSIS — M5030 Other cervical disc degeneration, unspecified cervical region: Secondary | ICD-10-CM | POA: Diagnosis not present

## 2021-03-15 DIAGNOSIS — Z7982 Long term (current) use of aspirin: Secondary | ICD-10-CM | POA: Diagnosis not present

## 2021-03-15 DIAGNOSIS — I110 Hypertensive heart disease with heart failure: Secondary | ICD-10-CM | POA: Diagnosis not present

## 2021-03-15 DIAGNOSIS — M810 Age-related osteoporosis without current pathological fracture: Secondary | ICD-10-CM | POA: Diagnosis not present

## 2021-03-15 DIAGNOSIS — Z9181 History of falling: Secondary | ICD-10-CM | POA: Diagnosis not present

## 2021-03-15 DIAGNOSIS — H9190 Unspecified hearing loss, unspecified ear: Secondary | ICD-10-CM | POA: Diagnosis not present

## 2021-03-15 DIAGNOSIS — S62101D Fracture of unspecified carpal bone, right wrist, subsequent encounter for fracture with routine healing: Secondary | ICD-10-CM | POA: Diagnosis not present

## 2021-03-17 ENCOUNTER — Telehealth: Payer: Self-pay | Admitting: Interventional Cardiology

## 2021-03-17 MED ORDER — AMLODIPINE BESYLATE 2.5 MG PO TABS: 2.5000 mg | ORAL_TABLET | Freq: Every day | ORAL | 3 refills | Status: DC

## 2021-03-17 NOTE — Telephone Encounter (Signed)
Pt c/o BP issue: STAT if pt c/o blurred vision, one-sided weakness or slurred speech  1. What are your last 5 BP readings? 187/96; 68; 188/100; 66; 164/95; 54 169/88; 69; 172/99; 64  2. Are you having any other symptoms (ex. Dizziness, headache, blurred vision, passed out)? dizziness  3. What is your BP issue? BP is elevated; patient wants to know if she need to go to ER

## 2021-03-17 NOTE — Telephone Encounter (Signed)
Spoke with pt and daughter.  All of these BPs are from today.  Pt currently only taking Metoprolol Tartrate 12.5mg  BID.  No longer on Spironolactone.  States another doctor took her off of that but unsure of why.  Pt denies any salt in her diet.  Occasionally has dizziness.  Advised I will speak with pharmacy team and see what we can do to get BP down.  Will call back after speaking with them.  Daughter agreeable to plan.

## 2021-03-17 NOTE — Telephone Encounter (Signed)
Spoke with Nada Boozer, NP and she said to start Amlodipine 2.5mg  once daily and have pt see HTN clinic first available.  Spoke with Wilma and made her aware of recommendations.  Scheduled pt to come in to see HTN clinic on 11/1.  Advised to bring BP readings and cuff to this appt.  Daughter appreciative for call.

## 2021-03-17 NOTE — Telephone Encounter (Signed)
Will route to Dr. Katrinka Blazing for review as well

## 2021-03-22 DIAGNOSIS — E7800 Pure hypercholesterolemia, unspecified: Secondary | ICD-10-CM | POA: Diagnosis not present

## 2021-03-22 DIAGNOSIS — M170 Bilateral primary osteoarthritis of knee: Secondary | ICD-10-CM | POA: Diagnosis not present

## 2021-03-22 DIAGNOSIS — M5030 Other cervical disc degeneration, unspecified cervical region: Secondary | ICD-10-CM | POA: Diagnosis not present

## 2021-03-22 DIAGNOSIS — S62101D Fracture of unspecified carpal bone, right wrist, subsequent encounter for fracture with routine healing: Secondary | ICD-10-CM | POA: Diagnosis not present

## 2021-03-22 DIAGNOSIS — S82141D Displaced bicondylar fracture of right tibia, subsequent encounter for closed fracture with routine healing: Secondary | ICD-10-CM | POA: Diagnosis not present

## 2021-03-22 DIAGNOSIS — I5032 Chronic diastolic (congestive) heart failure: Secondary | ICD-10-CM | POA: Diagnosis not present

## 2021-03-22 DIAGNOSIS — I872 Venous insufficiency (chronic) (peripheral): Secondary | ICD-10-CM | POA: Diagnosis not present

## 2021-03-22 DIAGNOSIS — H9190 Unspecified hearing loss, unspecified ear: Secondary | ICD-10-CM | POA: Diagnosis not present

## 2021-03-22 DIAGNOSIS — Z86711 Personal history of pulmonary embolism: Secondary | ICD-10-CM | POA: Diagnosis not present

## 2021-03-22 DIAGNOSIS — Z7901 Long term (current) use of anticoagulants: Secondary | ICD-10-CM | POA: Diagnosis not present

## 2021-03-22 DIAGNOSIS — M4802 Spinal stenosis, cervical region: Secondary | ICD-10-CM | POA: Diagnosis not present

## 2021-03-22 DIAGNOSIS — Z7982 Long term (current) use of aspirin: Secondary | ICD-10-CM | POA: Diagnosis not present

## 2021-03-22 DIAGNOSIS — M810 Age-related osteoporosis without current pathological fracture: Secondary | ICD-10-CM | POA: Diagnosis not present

## 2021-03-22 DIAGNOSIS — Z9181 History of falling: Secondary | ICD-10-CM | POA: Diagnosis not present

## 2021-03-22 DIAGNOSIS — E039 Hypothyroidism, unspecified: Secondary | ICD-10-CM | POA: Diagnosis not present

## 2021-03-22 DIAGNOSIS — S2242XD Multiple fractures of ribs, left side, subsequent encounter for fracture with routine healing: Secondary | ICD-10-CM | POA: Diagnosis not present

## 2021-03-22 DIAGNOSIS — I110 Hypertensive heart disease with heart failure: Secondary | ICD-10-CM | POA: Diagnosis not present

## 2021-03-22 DIAGNOSIS — M47812 Spondylosis without myelopathy or radiculopathy, cervical region: Secondary | ICD-10-CM | POA: Diagnosis not present

## 2021-03-27 DIAGNOSIS — S82109A Unspecified fracture of upper end of unspecified tibia, initial encounter for closed fracture: Secondary | ICD-10-CM | POA: Diagnosis not present

## 2021-03-28 ENCOUNTER — Ambulatory Visit: Payer: Medicare Other | Admitting: Pharmacist

## 2021-03-28 ENCOUNTER — Encounter (INDEPENDENT_AMBULATORY_CARE_PROVIDER_SITE_OTHER): Payer: Self-pay

## 2021-03-28 ENCOUNTER — Other Ambulatory Visit: Payer: Self-pay

## 2021-03-28 VITALS — BP 114/78 | HR 78 | Wt 186.4 lb

## 2021-03-28 DIAGNOSIS — I10 Essential (primary) hypertension: Secondary | ICD-10-CM

## 2021-03-28 DIAGNOSIS — R7301 Impaired fasting glucose: Secondary | ICD-10-CM | POA: Insufficient documentation

## 2021-03-28 NOTE — Patient Instructions (Addendum)
It was nice meeting you today  We would like your blood pressure to stay less than 130/80  Continue your: amlodipine 2.5mg  spironolactone 25 mg once daily Metoprolol 25mg  (1/2 tablet) twice a day  Check your blood pressure at home and give me a call this afternoon  , PharmD, BCACP, CDCES, CPP [facility] [address] [place], [place] Phone: [phone]; Fax: [phone] 03/28/2021 2:15 PM

## 2021-03-28 NOTE — Progress Notes (Signed)
Patient ID: [name]                 DOB: 1932/01/04                      MRN: [id number]     HPI: Lynn Brown is a 87 y.o. female referred by Nada Boozer to HTN clinic. Dr Katrinka Blazing patient. PMH is significant for HTN, NSTEMI, CHF, and PE.  Patient blood pressure historically been well controlled until 03/17/21 when patient called to report BP readings in 180s/100s.  Low dose amlodipine was started and patient was referred to HTN clinic.  Patient presents today with daughter.  Ambulates with a cane.  Reports that right knee was very painful 2 weeks ago when she called regarding BP.  Began monitoring BP at home.   Uses [employer] BP cuff on left arm.  Demonstrates proper technique.  Readings: 187/96 159/60 132/84 119/67 110/82 117/69 110/83 119/67  Reports a good appetite. Does not add salt to food and eats mostly vegetables such as salads, collard greens, and broccoli  Current HTN meds:   Amlodipine 2.5mg  daily Spironolactone 25mg  daily Metoprolol tartrate 12.5mg  BID Furosemide 40mg  prn  BP goal: <130/80  Wt Readings from Last 3 Encounters:  02/17/21 185 lb (83.9 kg)  12/31/20 188 lb (85.3 kg)  12/14/20 188 lb (85.3 kg)   BP Readings from Last 3 Encounters:  02/17/21 (!) 157/83  01/05/21 125/74  12/14/20 138/72   Pulse Readings from Last 3 Encounters:  02/17/21 64  01/05/21 60  12/14/20 72    Renal function: CrCl cannot be calculated (Patient's most recent lab result is older than the maximum 21 days allowed.).  Past Medical History:  Diagnosis Date   Allergic rhinitis    Anxiety    Bunion    PES PLANUS   Hearing loss    HEARING AIDS, BUT DOES NOT WEAR THEM CONSISTENTLY.    Hypercholesteremia    Hypertension    Hypothyroid    Leaky heart valve    DR. HARWANI   Mild obesity    Osteoarthritis of knees, bilateral    Osteoporosis 12/2017   T score -3.3   Venous insufficiency    Vitreomacular traction, right 04/14/2020   Vitrectomy 08-03-20    Current  Outpatient Medications on File Prior to Visit  Medication Sig Dispense Refill   Acetaminophen 500 MG capsule Take 500 mg by mouth every 6 (six) hours as needed for pain.     amLODipine (NORVASC) 2.5 MG tablet Take 1 tablet (2.5 mg total) by mouth daily. 90 tablet 3   apixaban (ELIQUIS) 5 MG TABS tablet Take 1 tablet (5 mg total) by mouth 2 (two) times daily. 60 tablet 6   ascorbic acid (VITAMIN C) 500 MG tablet Take 500 mg by mouth in the morning and at bedtime.     aspirin EC 81 MG tablet Take 1 tablet (81 mg total) by mouth daily. Swallow whole. 90 tablet 3   Calcium Citrate-Vitamin D 315-250 MG-UNIT TABS Take 1 tablet by mouth daily.     cetirizine (ZYRTEC) 10 MG tablet Take 10 mg by mouth daily as needed (allergies.).      clopidogrel (PLAVIX) 75 MG tablet      clorazepate (TRANXENE) 3.75 MG tablet      clorazepate (TRANXENE) 7.5 MG tablet Take 7.5 mg by mouth daily as needed for anxiety.      Coenzyme Q10 (COQ10 PO) Take 1 capsule by mouth  daily at 2 PM.     furosemide (LASIX) 20 MG tablet TAKE 1 TABLET BY MOUTH AS NEEDED FOR FLUID OR EDEMA 30 tablet 11   furosemide (LASIX) 40 MG tablet      levothyroxine (SYNTHROID) 112 MCG tablet Take 112 mcg by mouth every morning.     levothyroxine (SYNTHROID) 125 MCG tablet      magnesium oxide (MAG-OX) 400 MG tablet Take 400 mg by mouth every evening.      methocarbamol (ROBAXIN) 500 MG tablet Take 1 tablet (500 mg total) by mouth every 6 (six) hours as needed for muscle spasms. 45 tablet 0   metoprolol tartrate (LOPRESSOR) 25 MG tablet Take 0.5 tablets (12.5 mg total) by mouth 2 (two) times daily. 60 tablet 0   nitroGLYCERIN (NITROSTAT) 0.4 MG SL tablet Place 1 tablet (0.4 mg total) under the tongue every 5 (five) minutes as needed for chest pain. 30 tablet 0   oxyCODONE (OXY IR/ROXICODONE) 5 MG immediate release tablet Take 1-2 tablets (5-10 mg total) by mouth every 6 (six) hours as needed for breakthrough pain. 20 tablet 0   potassium chloride  (KLOR-CON 10) 10 MEQ tablet      rosuvastatin (CRESTOR) 20 MG tablet Take 20 mg by mouth every evening.      spironolactone (ALDACTONE) 25 MG tablet TAKE 1 TABLET BY MOUTH  DAILY 90 tablet 3   VITAMIN D PO Take 1 tablet by mouth daily at 2 PM.     No current facility-administered medications on file prior to visit.    No Known Allergies   Assessment/Plan:  1. Hypertension -  Patient BP in room 114/78.  Home cuff showed reading elevated in 140s/80s.  Verified with electronic cuff in office which was accurate. Patient says this is common for her for her blood pressure to fluctuate rapidly.  Likely blood pressure elevated on 10/21 due to knee pain.  Recommended she follow up with PCP regarding this.  Asked patient to check BP at home and to call back with result in case BP began elevating in room due to white coat HTN.  Patient voiced understanding.  Continue Amlodipine 2.5mg  daily Spironolactone 25mg  daily Recheck as needed  , PharmD, BCACP, CDCES, CPP [facility] [address] [place], [place] Phone: [phone]; Fax: [phone] 03/28/2021 6:04 PM

## 2021-03-30 ENCOUNTER — Ambulatory Visit (INDEPENDENT_AMBULATORY_CARE_PROVIDER_SITE_OTHER): Payer: Medicare Other | Admitting: Sports Medicine

## 2021-03-30 ENCOUNTER — Encounter: Payer: Self-pay | Admitting: Sports Medicine

## 2021-03-30 ENCOUNTER — Other Ambulatory Visit: Payer: Self-pay

## 2021-03-30 DIAGNOSIS — M79674 Pain in right toe(s): Secondary | ICD-10-CM | POA: Diagnosis not present

## 2021-03-30 DIAGNOSIS — B351 Tinea unguium: Secondary | ICD-10-CM

## 2021-03-30 DIAGNOSIS — M79675 Pain in left toe(s): Secondary | ICD-10-CM

## 2021-03-30 DIAGNOSIS — L84 Corns and callosities: Secondary | ICD-10-CM

## 2021-03-30 NOTE — Progress Notes (Signed)
Subjective: Lynn Brown is a 87 y.o. female patient seen today in office with complaint of mildly painful thickened and elongated toenails and callus; unable to trim. Reports that since her last visit was in hospital and had a bad fall. No other issues.   Patient Active Problem List   Diagnosis Date Noted   Impaired fasting glucose 03/28/2021   Fall from steps 12/31/2020   History of pulmonary embolism 11/18/2020   Pulmonary emboli (HCC) 08/09/2020   SOB (shortness of breath)    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   Macular retinoschisis, right 04/14/2020   Coronary aneurysm 06/26/2018   NSTEMI (non-ST elevated myocardial infarction) (HCC) 03/09/2018   Chronic diastolic heart failure (HCC) 09/25/2017   Hypothyroidism    Essential hypertension    Hypercholesteremia    Osteopenia 04/27/2008    Current Outpatient Medications on File Prior to Visit  Medication Sig Dispense Refill   Acetaminophen 500 MG capsule Take 500 mg by mouth every 6 (six) hours as needed for pain.     amLODipine (NORVASC) 2.5 MG tablet Take 1 tablet (2.5 mg total) by mouth daily. 90 tablet 3   apixaban (ELIQUIS) 5 MG TABS tablet Take 1 tablet (5 mg total) by mouth 2 (two) times daily. 60 tablet 6   ascorbic acid (VITAMIN C) 500 MG tablet Take 500 mg by mouth in the morning and at bedtime.     aspirin EC 81 MG tablet Take 1 tablet (81 mg total) by mouth daily. Swallow whole. 90 tablet 3   Calcium Citrate-Vitamin D 315-250 MG-UNIT TABS Take 1 tablet by mouth daily.     cetirizine (ZYRTEC) 10 MG tablet Take 10 mg by mouth daily as needed (allergies.).      clopidogrel (PLAVIX) 75 MG tablet      clorazepate (TRANXENE) 3.75 MG tablet      Coenzyme Q10 (COQ10 PO) Take 1 capsule by mouth daily at 2 PM.     furosemide (LASIX) 20 MG tablet TAKE 1 TABLET BY MOUTH AS NEEDED FOR FLUID OR EDEMA (Patient not taking: Reported on 03/28/2021) 30 tablet 11   furosemide (LASIX) 40 MG tablet  (Patient not taking:  Reported on 03/28/2021)     levothyroxine (SYNTHROID) 112 MCG tablet Take 112 mcg by mouth every morning.     magnesium oxide (MAG-OX) 400 MG tablet Take 400 mg by mouth every evening.      methocarbamol (ROBAXIN) 500 MG tablet Take 1 tablet (500 mg total) by mouth every 6 (six) hours as needed for muscle spasms. 45 tablet 0   metoprolol tartrate (LOPRESSOR) 25 MG tablet Take 0.5 tablets (12.5 mg total) by mouth 2 (two) times daily. 60 tablet 0   nitroGLYCERIN (NITROSTAT) 0.4 MG SL tablet Place 1 tablet (0.4 mg total) under the tongue every 5 (five) minutes as needed for chest pain. 30 tablet 0   potassium chloride (KLOR-CON) 10 MEQ tablet      rosuvastatin (CRESTOR) 20 MG tablet Take 20 mg by mouth every evening.      spironolactone (ALDACTONE) 25 MG tablet TAKE 1 TABLET BY MOUTH  DAILY 90 tablet 3   VITAMIN D PO Take 1 tablet by mouth daily at 2 PM.     No current facility-administered medications on file prior to visit.    No Known Allergies  Objective: Physical Exam  General: Well developed, nourished, no acute distress, awake, alert and oriented x 3  Vascular: Dorsalis pedis artery 1/4 bilateral, Posterior tibial artery 0/4 bilateral,  skin temperature warm to warm proximal to distal bilateral lower extremities, no varicosities, pedal hair present bilateral.  Neurological: Gross sensation present via light touch bilateral.   Dermatological: Skin is warm, dry, and supple bilateral, Nails 1-10 are tender, long, thick, and discolored with mild subungal debris, no webspace macerations present bilateral, no open lesions present bilateral, + hyperkeratotic tissue present to left great toe. No signs of infection bilateral.  Musculoskeletal: Asymptomatic hallux valgus and pes planus boney deformities noted bilateral. Muscular strength within normal limits without painon range of motion. No pain with calf compression bilateral.  Assessment and Plan:  Problem List Items Addressed This Visit    None Visit Diagnoses     Pain due to onychomycosis of toenails of both feet    -  Primary   Callus       Toe pain, left           -Examined patient.  -Discussed treatment options for painful mycotic nails. -Mechanically debrided callus x 1 at left 1st toe with 15 blade and reduced mycotic nails with sterile nail nipper and dremel nail file without incident. -Patient to return in 3 months for follow up evaluation or sooner if symptoms worsen.  Asencion Islam, DPM

## 2021-03-31 DIAGNOSIS — S82141D Displaced bicondylar fracture of right tibia, subsequent encounter for closed fracture with routine healing: Secondary | ICD-10-CM | POA: Diagnosis not present

## 2021-03-31 DIAGNOSIS — Z9181 History of falling: Secondary | ICD-10-CM | POA: Diagnosis not present

## 2021-03-31 DIAGNOSIS — M170 Bilateral primary osteoarthritis of knee: Secondary | ICD-10-CM | POA: Diagnosis not present

## 2021-03-31 DIAGNOSIS — E7800 Pure hypercholesterolemia, unspecified: Secondary | ICD-10-CM | POA: Diagnosis not present

## 2021-03-31 DIAGNOSIS — E039 Hypothyroidism, unspecified: Secondary | ICD-10-CM | POA: Diagnosis not present

## 2021-03-31 DIAGNOSIS — S2242XD Multiple fractures of ribs, left side, subsequent encounter for fracture with routine healing: Secondary | ICD-10-CM | POA: Diagnosis not present

## 2021-03-31 DIAGNOSIS — Z86711 Personal history of pulmonary embolism: Secondary | ICD-10-CM | POA: Diagnosis not present

## 2021-03-31 DIAGNOSIS — M5030 Other cervical disc degeneration, unspecified cervical region: Secondary | ICD-10-CM | POA: Diagnosis not present

## 2021-03-31 DIAGNOSIS — S62101D Fracture of unspecified carpal bone, right wrist, subsequent encounter for fracture with routine healing: Secondary | ICD-10-CM | POA: Diagnosis not present

## 2021-03-31 DIAGNOSIS — M47812 Spondylosis without myelopathy or radiculopathy, cervical region: Secondary | ICD-10-CM | POA: Diagnosis not present

## 2021-03-31 DIAGNOSIS — M4802 Spinal stenosis, cervical region: Secondary | ICD-10-CM | POA: Diagnosis not present

## 2021-03-31 DIAGNOSIS — Z7982 Long term (current) use of aspirin: Secondary | ICD-10-CM | POA: Diagnosis not present

## 2021-03-31 DIAGNOSIS — M810 Age-related osteoporosis without current pathological fracture: Secondary | ICD-10-CM | POA: Diagnosis not present

## 2021-03-31 DIAGNOSIS — I110 Hypertensive heart disease with heart failure: Secondary | ICD-10-CM | POA: Diagnosis not present

## 2021-03-31 DIAGNOSIS — I872 Venous insufficiency (chronic) (peripheral): Secondary | ICD-10-CM | POA: Diagnosis not present

## 2021-03-31 DIAGNOSIS — H9190 Unspecified hearing loss, unspecified ear: Secondary | ICD-10-CM | POA: Diagnosis not present

## 2021-03-31 DIAGNOSIS — Z7901 Long term (current) use of anticoagulants: Secondary | ICD-10-CM | POA: Diagnosis not present

## 2021-03-31 DIAGNOSIS — I5032 Chronic diastolic (congestive) heart failure: Secondary | ICD-10-CM | POA: Diagnosis not present

## 2021-04-04 ENCOUNTER — Other Ambulatory Visit: Payer: Self-pay | Admitting: Family Medicine

## 2021-04-04 DIAGNOSIS — Z1231 Encounter for screening mammogram for malignant neoplasm of breast: Secondary | ICD-10-CM

## 2021-04-05 DIAGNOSIS — Z9181 History of falling: Secondary | ICD-10-CM | POA: Diagnosis not present

## 2021-04-05 DIAGNOSIS — E039 Hypothyroidism, unspecified: Secondary | ICD-10-CM | POA: Diagnosis not present

## 2021-04-05 DIAGNOSIS — Z86711 Personal history of pulmonary embolism: Secondary | ICD-10-CM | POA: Diagnosis not present

## 2021-04-05 DIAGNOSIS — Z7901 Long term (current) use of anticoagulants: Secondary | ICD-10-CM | POA: Diagnosis not present

## 2021-04-05 DIAGNOSIS — I5032 Chronic diastolic (congestive) heart failure: Secondary | ICD-10-CM | POA: Diagnosis not present

## 2021-04-05 DIAGNOSIS — I110 Hypertensive heart disease with heart failure: Secondary | ICD-10-CM | POA: Diagnosis not present

## 2021-04-05 DIAGNOSIS — M47812 Spondylosis without myelopathy or radiculopathy, cervical region: Secondary | ICD-10-CM | POA: Diagnosis not present

## 2021-04-05 DIAGNOSIS — M810 Age-related osteoporosis without current pathological fracture: Secondary | ICD-10-CM | POA: Diagnosis not present

## 2021-04-05 DIAGNOSIS — M170 Bilateral primary osteoarthritis of knee: Secondary | ICD-10-CM | POA: Diagnosis not present

## 2021-04-05 DIAGNOSIS — S62101D Fracture of unspecified carpal bone, right wrist, subsequent encounter for fracture with routine healing: Secondary | ICD-10-CM | POA: Diagnosis not present

## 2021-04-05 DIAGNOSIS — S82141D Displaced bicondylar fracture of right tibia, subsequent encounter for closed fracture with routine healing: Secondary | ICD-10-CM | POA: Diagnosis not present

## 2021-04-05 DIAGNOSIS — E7800 Pure hypercholesterolemia, unspecified: Secondary | ICD-10-CM | POA: Diagnosis not present

## 2021-04-05 DIAGNOSIS — Z7982 Long term (current) use of aspirin: Secondary | ICD-10-CM | POA: Diagnosis not present

## 2021-04-05 DIAGNOSIS — M5030 Other cervical disc degeneration, unspecified cervical region: Secondary | ICD-10-CM | POA: Diagnosis not present

## 2021-04-05 DIAGNOSIS — S2242XD Multiple fractures of ribs, left side, subsequent encounter for fracture with routine healing: Secondary | ICD-10-CM | POA: Diagnosis not present

## 2021-04-05 DIAGNOSIS — M4802 Spinal stenosis, cervical region: Secondary | ICD-10-CM | POA: Diagnosis not present

## 2021-04-05 DIAGNOSIS — H9190 Unspecified hearing loss, unspecified ear: Secondary | ICD-10-CM | POA: Diagnosis not present

## 2021-04-05 DIAGNOSIS — I872 Venous insufficiency (chronic) (peripheral): Secondary | ICD-10-CM | POA: Diagnosis not present

## 2021-04-06 DIAGNOSIS — Z86711 Personal history of pulmonary embolism: Secondary | ICD-10-CM | POA: Diagnosis not present

## 2021-04-26 DIAGNOSIS — S82109A Unspecified fracture of upper end of unspecified tibia, initial encounter for closed fracture: Secondary | ICD-10-CM | POA: Diagnosis not present

## 2021-05-01 DIAGNOSIS — M1712 Unilateral primary osteoarthritis, left knee: Secondary | ICD-10-CM | POA: Diagnosis not present

## 2021-05-01 DIAGNOSIS — M1711 Unilateral primary osteoarthritis, right knee: Secondary | ICD-10-CM | POA: Diagnosis not present

## 2021-05-06 DIAGNOSIS — Z86711 Personal history of pulmonary embolism: Secondary | ICD-10-CM | POA: Diagnosis not present

## 2021-05-08 ENCOUNTER — Ambulatory Visit
Admission: RE | Admit: 2021-05-08 | Discharge: 2021-05-08 | Disposition: A | Discharge status: Home / Self Care | Payer: Medicare Other | Source: Ambulatory Visit | Attending: Family Medicine | Admitting: Family Medicine

## 2021-05-08 DIAGNOSIS — Z1231 Encounter for screening mammogram for malignant neoplasm of breast: Secondary | ICD-10-CM

## 2021-06-09 ENCOUNTER — Other Ambulatory Visit (HOSPITAL_COMMUNITY): Payer: Self-pay

## 2021-07-06 ENCOUNTER — Ambulatory Visit: Payer: Medicare Other | Admitting: Sports Medicine

## 2021-07-24 ENCOUNTER — Telehealth: Payer: Self-pay | Admitting: Interventional Cardiology

## 2021-07-24 NOTE — Telephone Encounter (Signed)
Pt c/o BP issue: STAT if pt c/o blurred vision, one-sided weakness or slurred speech  1. What are your last 5 BP readings? 151/90 pulse 56, 197/ do not remember the bottom number, 160/103 pulse 53, 152/93, 142/80, 137/77 160/95 2. Are you having any other symptoms (ex. Dizziness, headache, blurred vision, passed out)? Some shortness of breath   3. What is your BP issue? High blood pressure- pt wants to be seen

## 2021-07-24 NOTE — Telephone Encounter (Signed)
Spoke with pt who reports labile BP over the past month.  Pt confirms BP as below and reports slight increase in SOB at rest.  She states she is taking medications as prescribed and is following a low sodium diet.  She denies current CP or other symptoms.  She is scheduled to see Dr Tamala Julian 07/28/2021. Will forward information to PharmD as she is followed by HTN clinic and Dr Tamala Julian for further review and recommendation.  Pt verbalizes understanding and agrees with current plan.

## 2021-07-24 NOTE — Telephone Encounter (Signed)
Per Dr Tamala Julian - Encourage low-salt intake.  Verify that furosemide has been discontinued.  Educated that blood pressure range is 130 to [id number] mmHg systolic and 60 to 80 mmHg diastolic.    Spoke with pt and verified she is no longer taking Furosemide.  Provided education re: normal blood pressure range.  Pt will continue to monitor BP daily.  Sh again affirms she is on a low salt diet, she does not add salt to her food and confirms she mostly eats fresh vegetables.  Reviewed foods with hidden sodium.  Pt will keep 07/28/2021 appointment with Dr Tamala Julian.  Pt verbalizes understanding and agrees with current plan.

## 2021-07-26 NOTE — Progress Notes (Signed)
Cardiology Office Note:    Date:  07/28/2021   ID:  Lynn Brown, DOB [birth year], MRN [id number]  PCP:  Laurann Montana, MD  Cardiologist:  Lesleigh Noe, MD   Referring MD: Laurann Montana, MD   Chief Complaint  Patient presents with   Coronary Artery Disease   Hospitalization Follow-up    Hypertension     History of Present Illness:    Lynn Brown is a 88 y.o. female with a hx of nephrectomy as a kidney donor, hyperlipidemia, chronic lower extremity edema, hyperlipidemia, hypothyroidism, "leaky heart valve", LAD aneurysm/circumflex ectasia/RCA ectasia.  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  Eliquis was started as therapy for coronary microembolization from aneurysm and eventually discontinued.  Subsequent development of bilateral submassive pulmonary emboli and now back on Eliquis.  Here because of BP concerns.   Many of her blood pressures are recorded first thing in the morning before her a.m. medications have had a chance to become effective.  She measures her blood pressure at least a couple times a day.  She gets concerned with blood pressures that are above 150.  She brought in her medication log and she has blood pressures on occasion between 155 and 175.  The great majority of blood pressures in the 140/80 range.  Past Medical History:  Diagnosis Date   Allergic rhinitis    Anxiety    Bunion    PES PLANUS   Hearing loss    HEARING AIDS, BUT DOES NOT WEAR THEM CONSISTENTLY.    Hypercholesteremia    Hypertension    Hypothyroid    Leaky heart valve    DR. HARWANI   Mild obesity    Osteoarthritis of knees, bilateral    Osteoporosis 12/2017   T score -3.3   Venous insufficiency    Vitreomacular traction, right 04/14/2020   Vitrectomy 08-03-20    Past Surgical History:  Procedure Laterality Date   ABDOMINAL AORTOGRAM W/LOWER EXTREMITY Left 05/04/2020   Procedure: ABDOMINAL AORTOGRAM W/LOWER EXTREMITY;  Surgeon: Leonie Douglas, MD;  Location: [facility];  Service: Cardiovascular;  Laterality: Left;   CATARACT EXTRACTION, BILATERAL  2013   DR. SHAPIRO    HYSTEROSCOPY     D & C   LEFT HEART CATH AND CORONARY ANGIOGRAPHY N/A 03/10/2018   Procedure: LEFT HEART CATH AND CORONARY ANGIOGRAPHY;  Surgeon: Lyn Records, MD;  Location: [facility];  Service: Cardiovascular;  Laterality: N/A;   NEPHRECTOMY LIVING DONOR  1978   PERIPHERAL VASCULAR INTERVENTION Left 05/04/2020   Procedure: PERIPHERAL VASCULAR INTERVENTION;  Surgeon: Leonie Douglas, MD;  Location: [facility];  Service: Cardiovascular;  Laterality: Left;  Femoral popliteal    Current Medications: Current Meds  Medication Sig   Acetaminophen 500 MG capsule Take 500 mg by mouth every 6 (six) hours as needed for pain.   amLODipine (NORVASC) 2.5 MG tablet Take 1 tablet (2.5 mg total) by mouth daily.   apixaban (ELIQUIS) 5 MG TABS tablet Take 1 tablet (5 mg total) by mouth 2 (two) times daily.   ascorbic acid (VITAMIN C) 500 MG tablet Take 500 mg by mouth in the morning and at bedtime.   clorazepate (TRANXENE) 3.75 MG tablet    Coenzyme Q10 (COQ10 PO) Take 1 capsule by mouth daily at 2 PM.   dapagliflozin propanediol (FARXIGA) 10 MG TABS tablet Take 1 tablet (10 mg total) by mouth daily before breakfast.   levothyroxine (SYNTHROID) 112  MCG tablet Take 112 mcg by mouth every morning.   magnesium oxide (MAG-OX) 400 MG tablet Take 400 mg by mouth every evening.    methocarbamol (ROBAXIN) 500 MG tablet Take 1 tablet (500 mg total) by mouth every 6 (six) hours as needed for muscle spasms.   metoprolol tartrate (LOPRESSOR) 25 MG tablet Take 0.5 tablets (12.5 mg total) by mouth 2 (two) times daily.   rosuvastatin (CRESTOR) 20 MG tablet Take 20 mg by mouth every evening.    spironolactone (ALDACTONE) 25 MG tablet TAKE 1 TABLET BY MOUTH  DAILY   VITAMIN D PO Take 1 tablet by mouth daily at 2 PM.   [DISCONTINUED] aspirin EC 81 MG tablet  Take 1 tablet (81 mg total) by mouth daily. Swallow whole.   [DISCONTINUED] clopidogrel (PLAVIX) 75 MG tablet    [DISCONTINUED] nitroGLYCERIN (NITROSTAT) 0.4 MG SL tablet Place 1 tablet (0.4 mg total) under the tongue every 5 (five) minutes as needed for chest pain.     Allergies:   Patient has no known allergies.   Social History   Socioeconomic History   Marital status: Widowed    Spouse name: Not on file   Number of children: 4   Years of education: Not on file   Highest education level: Not on file  Occupational History   Occupation: RETIRED  Tobacco Use   Smoking status: Never   Smokeless tobacco: Never  Vaping Use   Vaping Use: Never used  Substance and Sexual Activity   Alcohol use: No    Alcohol/week: 0.0 standard drinks   Drug use: No   Sexual activity: Never    Comment: 1st intercourse 88 yo-Fewer than 5 partners  Other Topics Concern   Not on file  Social History Narrative   Not on file   Social Determinants of Health   Financial Resource Strain: Not on file  Food Insecurity: Not on file  Transportation Needs: Not on file  Physical Activity: Not on file  Stress: Not on file  Social Connections: Not on file     Family History: The patient's family history includes CVA in her brother, father, and mother; Cancer in her brother; Deep vein thrombosis in her sister; Diabetes in her sister; Heart disease in her sister; Hypertension in her brother, brother, father, mother, and sister; Kidney disease in her sister; Kidney failure in her sister; Lymphoma in her daughter; Multiple sclerosis in her sister; Other in her brother; Pancreatic cancer in her sister; Pulmonary embolism in her niece; Seizures in her brother. There is no history of Breast cancer.  ROS:   Please see the history of present illness.    She has concerns about her blood pressure.  She has chronic lower extremity swelling.  Lichenification of skin has occurred.  All other systems reviewed and are  negative.  EKGs/Labs/Other Studies Reviewed:    The following studies were reviewed today: 2 D Doppler ECHOCARDIOGRAM 2022: IMPRESSIONS     1. Left ventricular ejection fraction, by estimation, is 55 to 60%. Left  ventricular ejection fraction by 3D volume is 56 %. The left ventricle has  normal function. The left ventricle has no regional wall motion  abnormalities. There is mild left  ventricular hypertrophy. Left ventricular diastolic parameters are  consistent with Grade I diastolic dysfunction (impaired relaxation). There  is the interventricular septum is flattened in systole, consistent with  right ventricular pressure overload.   2. Right ventricular systolic function is mildly reduced. The right  ventricular size is  normal. There is moderately elevated pulmonary artery  systolic pressure. The estimated right ventricular systolic pressure is  49.2 mmHg.   3. The mitral valve is abnormal. Trivial mitral valve regurgitation.   4. The aortic valve is tricuspid. Aortic valve regurgitation is not  visualized.   Comparison(s): Changes from prior study are noted. 08/10/20 EF 55-60%. PA  pressure .  EKG:  EKG normal sinus rhythm/sinus bradycardia with biatrial abnormality.  Nonspecific ST-T abnormality.  Recent Labs: 08/09/2020: B Natriuretic Peptide 85.9 02/17/2021: ALT 12; BUN 22; Creatinine 0.99; Hemoglobin 12.9; Platelet Count 195; Potassium 4.3; Sodium 140  Recent Lipid Panel No results found for: CHOL, TRIG, HDL, CHOLHDL, VLDL, LDLCALC, LDLDIRECT  Physical Exam:    VS:  BP 130/74    Pulse (!) 56    Ht 5\' 1"  (1.549 m)    Wt 185 lb 3.2 oz (84 kg)    SpO2 96%    BMI 34.99 kg/m     Wt Readings from Last 3 Encounters:  07/28/21 185 lb 3.2 oz (84 kg)  03/28/21 186 lb 6.4 oz (84.6 kg)  02/17/21 185 lb (83.9 kg)     GEN: Slightly overweight. No acute distress HEENT: Normal NECK: No JVD. LYMPHATICS: No lymphadenopathy CARDIAC: Soft 1/6 systolic murmur. RRR S4 gallop,  or edema. VASCULAR:  Normal Pulses. No bruits. RESPIRATORY:  Clear to auscultation without rales, wheezing or rhonchi  ABDOMEN: Soft, non-tender, non-distended, No pulsatile mass, MUSCULOSKELETAL: No deformity  SKIN: Warm and dry NEUROLOGIC:  Alert and oriented x 3 PSYCHIATRIC:  Normal affect   ASSESSMENT:    1. Essential hypertension   2. NSTEMI (non-ST elevated myocardial infarction) (HCC)   3. Coronary aneurysm   4. Chronic diastolic heart failure (HCC)   5. Hypercholesteremia   6. Chronic anticoagulation   7. History of pulmonary embolism   8. Diastolic dysfunction    PLAN:    In order of problems listed above:  Many of the elevated blood pressures are recorded prior to pharmacologic effect of a.m. blood pressure medications which include metoprolol, spironolactone, and amlodipine.  We will have concerned if she consistently runs above 150 mmHg systolic. No recurrent angina. We will discontinue aspirin and Plavix.  Continue apixaban.  Apixaban was started after pulmonary emboli. She does have diastolic dysfunction.  May need to consider SGLT2 therapy. Continue statin therapy Continue apixaban Continue apixaban for pulmonary embolism Start SGLT2 therapy.  Bmet in 2 weeks.  Clinical follow-up 4 to 6 months.  Guideline directed therapy for left ventricular systolic dysfunction: SGLT-2 agents -  Dapagliflozin 02/19/21) or Empagliflozin (Jardiance).These therapies have been shown to improve clinical outcomes including reduction of rehospitalization, survival, and acute heart failure.    Medication Adjustments/Labs and Tests Ordered: Current medicines are reviewed at length with the patient today.  Concerns regarding medicines are outlined above.  Orders Placed This Encounter  Procedures   Basic metabolic panel   EKG 12-Lead   Meds ordered this encounter  Medications   nitroGLYCERIN (NITROSTAT) 0.4 MG SL tablet    Sig: Place 1 tablet (0.4 mg total) under the tongue every  5 (five) minutes as needed for chest pain.    Dispense:  90 tablet    Refill:  3   dapagliflozin propanediol (FARXIGA) 10 MG TABS tablet    Sig: Take 1 tablet (10 mg total) by mouth daily before breakfast.    Dispense:  30 tablet    Refill:  11    Patient Instructions  Medication Instructions:  1) DISCONTINUE  Aspirin and Plavix 2) START Farxiga 10mg  once daily  *If you need a refill on your cardiac medications before your next appointment, please call your pharmacy*   Lab Work: BMET 2 weeks  If you have labs (blood work) drawn today and your tests are completely normal, you will receive your results only by: MyChart Message (if you have MyChart) OR A paper copy in the mail If you have any lab test that is abnormal or we need to change your treatment, we will call you to review the results.   Testing/Procedures: None   Follow-Up: At [facility], you and your health needs are our priority.  As part of our continuing mission to provide you with exceptional heart care, we have created designated Provider Care Teams.  These Care Teams include your primary Cardiologist (physician) and Advanced Practice Providers (APPs -  Physician Assistants and Nurse Practitioners) who all work together to provide you with the care you need, when you need it.  We recommend signing up for the patient portal called "MyChart".  Sign up information is provided on this After Visit Summary.  MyChart is used to connect with patients for Virtual Visits (Telemedicine).  Patients are able to view lab/test results, encounter notes, upcoming appointments, etc.  Non-urgent messages can be sent to your provider as well.   To learn more about what you can do with MyChart, go to [email or web address].    Your next appointment:   6 month(s)  The format for your next appointment:   In Person  Provider:   Lesleigh NoeHenry W Akeyla Molden III, MD     Other Instructions     Signed, Lesleigh NoeHenry W Carlous Olivares III, MD  07/28/2021 10:36 AM     [facility]

## 2021-07-28 ENCOUNTER — Encounter: Payer: Self-pay | Admitting: Interventional Cardiology

## 2021-07-28 ENCOUNTER — Encounter (INDEPENDENT_AMBULATORY_CARE_PROVIDER_SITE_OTHER): Payer: Self-pay

## 2021-07-28 ENCOUNTER — Ambulatory Visit: Payer: Medicare Other | Admitting: Interventional Cardiology

## 2021-07-28 ENCOUNTER — Other Ambulatory Visit: Payer: Self-pay

## 2021-07-28 VITALS — BP 130/74 | HR 56 | Ht 61.0 in | Wt 185.2 lb

## 2021-07-28 DIAGNOSIS — Z86711 Personal history of pulmonary embolism: Secondary | ICD-10-CM | POA: Diagnosis not present

## 2021-07-28 DIAGNOSIS — I10 Essential (primary) hypertension: Secondary | ICD-10-CM

## 2021-07-28 DIAGNOSIS — I5032 Chronic diastolic (congestive) heart failure: Secondary | ICD-10-CM

## 2021-07-28 DIAGNOSIS — Z7901 Long term (current) use of anticoagulants: Secondary | ICD-10-CM | POA: Diagnosis not present

## 2021-07-28 DIAGNOSIS — E7800 Pure hypercholesterolemia, unspecified: Secondary | ICD-10-CM

## 2021-07-28 DIAGNOSIS — I2541 Coronary artery aneurysm: Secondary | ICD-10-CM | POA: Diagnosis not present

## 2021-07-28 DIAGNOSIS — I214 Non-ST elevation (NSTEMI) myocardial infarction: Secondary | ICD-10-CM

## 2021-07-28 DIAGNOSIS — I5189 Other ill-defined heart diseases: Secondary | ICD-10-CM | POA: Diagnosis not present

## 2021-07-28 MED ORDER — NITROGLYCERIN 0.4 MG SL SUBL: 0.4000 mg | SUBLINGUAL_TABLET | SUBLINGUAL | 3 refills | Status: DC | PRN

## 2021-07-28 MED ORDER — DAPAGLIFLOZIN PROPANEDIOL 10 MG PO TABS: 10.0000 mg | ORAL_TABLET | Freq: Every day | ORAL | 11 refills | Status: DC

## 2021-07-28 MED ORDER — NITROGLYCERIN 0.4 MG SL SUBL: 0.4000 mg | SUBLINGUAL_TABLET | SUBLINGUAL | 3 refills | Status: AC | PRN

## 2021-07-28 NOTE — Addendum Note (Signed)
Addended by: Julio Sicks on: 07/28/2021 11:58 AM ? ? Modules accepted: Orders ? ?

## 2021-07-28 NOTE — Patient Instructions (Addendum)
Medication Instructions:  ?1) DISCONTINUE Aspirin and Plavix ?2) START Farxiga 10mg  once daily ? ?*If you need a refill on your cardiac medications before your next appointment, please call your pharmacy* ? ? ?Lab Work: ?BMET 2 weeks ? ?If you have labs (blood work) drawn today and your tests are completely normal, you will receive your results only by: ?MyChart Message (if you have MyChart) OR ?A paper copy in the mail ?If you have any lab test that is abnormal or we need to change your treatment, we will call you to review the results. ? ? ?Testing/Procedures: ?None ? ? ?Follow-Up: ?At [facility], you and your health needs are our priority.  As part of our continuing mission to provide you with exceptional heart care, we have created designated Provider Care Teams.  These Care Teams include your primary Cardiologist (physician) and Advanced Practice Providers (APPs -  Physician Assistants and Nurse Practitioners) who all work together to provide you with the care you need, when you need it. ? ?We recommend signing up for the patient portal called "MyChart".  Sign up information is provided on this After Visit Summary.  MyChart is used to connect with patients for Virtual Visits (Telemedicine).  Patients are able to view lab/test results, encounter notes, upcoming appointments, etc.  Non-urgent messages can be sent to your provider as well.   ?To learn more about what you can do with MyChart, go to [facility].   ? ?Your next appointment:   ?6 month(s) ? ?The format for your next appointment:   ?In Person ? ?Provider:   ?[email or web address], MD   ? ? ?Other Instructions ?  ?

## 2021-07-31 ENCOUNTER — Telehealth: Payer: Self-pay | Admitting: Interventional Cardiology

## 2021-07-31 NOTE — Telephone Encounter (Signed)
New Message: ? ? ? ?Daughter wants to know if patient supposed to have lab work by 08-04-21 and have some on 08-14-21? ?

## 2021-07-31 NOTE — Telephone Encounter (Signed)
Spoke with daughter and made her aware that pt would only need to have labs drawn on 3/20.  Daughter appreciative for call.  ?

## 2021-08-03 ENCOUNTER — Other Ambulatory Visit: Payer: Self-pay

## 2021-08-03 ENCOUNTER — Ambulatory Visit: Payer: Medicare Other | Admitting: Sports Medicine

## 2021-08-03 ENCOUNTER — Encounter: Payer: Self-pay | Admitting: Sports Medicine

## 2021-08-03 DIAGNOSIS — B351 Tinea unguium: Secondary | ICD-10-CM

## 2021-08-03 DIAGNOSIS — L84 Corns and callosities: Secondary | ICD-10-CM

## 2021-08-03 DIAGNOSIS — M79674 Pain in right toe(s): Secondary | ICD-10-CM

## 2021-08-03 DIAGNOSIS — I739 Peripheral vascular disease, unspecified: Secondary | ICD-10-CM

## 2021-08-03 DIAGNOSIS — M79675 Pain in left toe(s): Secondary | ICD-10-CM | POA: Diagnosis not present

## 2021-08-03 DIAGNOSIS — R7301 Impaired fasting glucose: Secondary | ICD-10-CM

## 2021-08-03 NOTE — Progress Notes (Signed)
Subjective: ?Lynn Brown is a 88 y.o. female patient seen today in office with complaint of mildly painful thickened and elongated toenails and callus; unable to trim. Reports that she is doing ok. No other issues.  ? ?Patient is assisted by her daughter this visit.  ? ?Patient Active Problem List  ? Diagnosis Date Noted  ? Impaired fasting glucose 03/28/2021  ? Fall from steps 12/31/2020  ? History of pulmonary embolism 11/18/2020  ? Pulmonary emboli (HCC) 08/09/2020  ? SOB (shortness of breath)   ? 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  ? Macular retinoschisis, right 04/14/2020  ? Coronary aneurysm 06/26/2018  ? NSTEMI (non-ST elevated myocardial infarction) (HCC) 03/09/2018  ? Chronic diastolic heart failure (HCC) 09/25/2017  ? Hypothyroidism   ? Essential hypertension   ? Hypercholesteremia   ? Osteopenia 04/27/2008  ? ? ?Current Outpatient Medications on File Prior to Visit  ?Medication Sig Dispense Refill  ? Acetaminophen 500 MG capsule Take 500 mg by mouth every 6 (six) hours as needed for pain.    ? amLODipine (NORVASC) 2.5 MG tablet Take 1 tablet (2.5 mg total) by mouth daily. 90 tablet 3  ? apixaban (ELIQUIS) 5 MG TABS tablet Take 1 tablet (5 mg total) by mouth 2 (two) times daily. 60 tablet 6  ? ascorbic acid (VITAMIN C) 500 MG tablet Take 500 mg by mouth in the morning and at bedtime.    ? Calcium Citrate-Vitamin D 315-250 MG-UNIT TABS Take 1 tablet by mouth daily. (Patient not taking: Reported on 07/28/2021)    ? cetirizine (ZYRTEC) 10 MG tablet Take 10 mg by mouth daily as needed (allergies.).  (Patient not taking: Reported on 07/28/2021)    ? clorazepate (TRANXENE) 3.75 MG tablet     ? Coenzyme Q10 (COQ10 PO) Take 1 capsule by mouth daily at 2 PM.    ? dapagliflozin propanediol (FARXIGA) 10 MG TABS tablet Take 1 tablet (10 mg total) by mouth daily before breakfast. 30 tablet 11  ? furosemide (LASIX) 20 MG tablet TAKE 1 TABLET BY MOUTH AS NEEDED FOR FLUID OR EDEMA (Patient not taking: Reported  on 07/28/2021) 30 tablet 11  ? furosemide (LASIX) 40 MG tablet  (Patient not taking: Reported on 07/28/2021)    ? levothyroxine (SYNTHROID) 112 MCG tablet Take 112 mcg by mouth every morning.    ? magnesium oxide (MAG-OX) 400 MG tablet Take 400 mg by mouth every evening.     ? methocarbamol (ROBAXIN) 500 MG tablet Take 1 tablet (500 mg total) by mouth every 6 (six) hours as needed for muscle spasms. 45 tablet 0  ? metoprolol tartrate (LOPRESSOR) 25 MG tablet Take 0.5 tablets (12.5 mg total) by mouth 2 (two) times daily. 60 tablet 0  ? nitroGLYCERIN (NITROSTAT) 0.4 MG SL tablet Place 1 tablet (0.4 mg total) under the tongue every 5 (five) minutes as needed for chest pain. 25 tablet 3  ? potassium chloride (KLOR-CON) 10 MEQ tablet  (Patient not taking: Reported on 07/28/2021)    ? rosuvastatin (CRESTOR) 20 MG tablet Take 20 mg by mouth every evening.     ? spironolactone (ALDACTONE) 25 MG tablet TAKE 1 TABLET BY MOUTH  DAILY 90 tablet 3  ? VITAMIN D PO Take 1 tablet by mouth daily at 2 PM.    ? ?No current facility-administered medications on file prior to visit.  ? ? ?No Known Allergies ? ?Objective: ?Physical Exam ? ?General: Well developed, nourished, no acute distress, awake, alert and oriented x 3 ? ?  Vascular: Dorsalis pedis artery 1/4 bilateral, Posterior tibial artery 0/4 bilateral, skin temperature warm to warm proximal to distal bilateral lower extremities, no varicosities, pedal hair present bilateral. ? ?Neurological: Gross sensation present via light touch bilateral.  ? ?Dermatological: Skin is warm, dry, and supple bilateral, Nails 1-10 are tender, long, thick, and discolored with mild subungal debris, no webspace macerations present bilateral, no open lesions present bilateral, + hyperkeratotic tissue present to left great toe. No signs of infection bilateral. ? ?Musculoskeletal: Asymptomatic hallux valgus and pes planus boney deformities noted bilateral. Muscular strength within normal limits without painon  range of motion. No pain with calf compression bilateral. ? ?Assessment and Plan:  ?Problem List Items Addressed This Visit   ? ?  ? Endocrine  ? Impaired fasting glucose  ? ?Other Visit Diagnoses   ? ? Pain due to onychomycosis of toenails of both feet    -  Primary  ? Callus      ? Toe pain, left      ? PVD (peripheral vascular disease) (HCC)      ? ?  ? ? ?-Examined patient.  ?-Discussed treatment options for painful mycotic nails. ?-Mechanically debrided callus x 1 at left 1st toe with 15 blade and reduced all painful mycotic nails with sterile nail nipper and dremel nail file without incident. ?-Continue with good supportive shoes daily for foot type ?-Continue with elevation to assist with edema control ?-Patient to return in 3 months for follow up evaluation or sooner if symptoms worsen. ? ?Asencion Islam, DPM ? ?

## 2021-08-07 ENCOUNTER — Other Ambulatory Visit: Payer: Self-pay | Admitting: Interventional Cardiology

## 2021-08-07 NOTE — Telephone Encounter (Signed)
Prescription refill request for Eliquis received. ?hx:PE ?Last office visit: 07/28/2021 ?Scr: 0.99, 02/17/2021 ?Age: 88 yo  ?Weight: 84 kg  ?

## 2021-08-14 ENCOUNTER — Other Ambulatory Visit: Payer: Medicare Other | Admitting: *Deleted

## 2021-08-14 ENCOUNTER — Other Ambulatory Visit: Payer: Self-pay

## 2021-08-14 DIAGNOSIS — I5032 Chronic diastolic (congestive) heart failure: Secondary | ICD-10-CM | POA: Diagnosis not present

## 2021-08-15 LAB — BASIC METABOLIC PANEL
BUN/Creatinine Ratio: 25 (ref 12–28)
BUN: 22 mg/dL (ref 8–27)
CO2: 24 mmol/L (ref 20–29)
Calcium: 9.9 mg/dL (ref 8.7–10.3)
Chloride: 104 mmol/L (ref 96–106)
Creatinine, Ser: 0.89 mg/dL (ref 0.57–1.00)
Glucose: 81 mg/dL (ref 70–99)
Potassium: 4.7 mmol/L (ref 3.5–5.2)
Sodium: 142 mmol/L (ref 134–144)
eGFR: 63 mL/min/{1.73_m2} (ref >59)

## 2021-08-25 DIAGNOSIS — I10 Essential (primary) hypertension: Secondary | ICD-10-CM | POA: Diagnosis not present

## 2021-08-25 DIAGNOSIS — E785 Hyperlipidemia, unspecified: Secondary | ICD-10-CM | POA: Diagnosis not present

## 2021-08-25 DIAGNOSIS — I5032 Chronic diastolic (congestive) heart failure: Secondary | ICD-10-CM | POA: Diagnosis not present

## 2021-08-25 DIAGNOSIS — E039 Hypothyroidism, unspecified: Secondary | ICD-10-CM | POA: Diagnosis not present

## 2021-09-18 ENCOUNTER — Encounter (INDEPENDENT_AMBULATORY_CARE_PROVIDER_SITE_OTHER): Payer: Self-pay

## 2021-09-18 DIAGNOSIS — Z961 Presence of intraocular lens: Secondary | ICD-10-CM | POA: Diagnosis not present

## 2021-09-18 DIAGNOSIS — H524 Presbyopia: Secondary | ICD-10-CM | POA: Diagnosis not present

## 2021-09-18 DIAGNOSIS — H43822 Vitreomacular adhesion, left eye: Secondary | ICD-10-CM | POA: Diagnosis not present

## 2021-09-18 DIAGNOSIS — H52203 Unspecified astigmatism, bilateral: Secondary | ICD-10-CM | POA: Diagnosis not present

## 2021-09-25 DIAGNOSIS — M810 Age-related osteoporosis without current pathological fracture: Secondary | ICD-10-CM | POA: Diagnosis not present

## 2021-09-26 ENCOUNTER — Other Ambulatory Visit: Payer: Self-pay

## 2021-09-26 MED ORDER — APIXABAN 5 MG PO TABS: 5.0000 mg | ORAL_TABLET | Freq: Two times a day (BID) | ORAL | 1 refills | Status: DC

## 2021-09-26 NOTE — Telephone Encounter (Signed)
Received a call from the patients daughter, Darryll Capers -verified by DPR, who is requesting a refill on Eliquis.  ? ?Request sent to anti coag dept ?

## 2021-09-26 NOTE — Telephone Encounter (Signed)
Pt last saw Dr Katrinka Blazing 07/28/21, last labs 08/14/21 Creat 0.89, age 88, weight 84kg, based on specified criteria pt is on appropriate dosage of Eliquis 5mg  BID.  Will refill rx  ?

## 2021-09-27 ENCOUNTER — Ambulatory Visit (INDEPENDENT_AMBULATORY_CARE_PROVIDER_SITE_OTHER): Payer: Medicare Other | Admitting: Ophthalmology

## 2021-09-27 ENCOUNTER — Encounter (INDEPENDENT_AMBULATORY_CARE_PROVIDER_SITE_OTHER): Payer: Self-pay | Admitting: Ophthalmology

## 2021-09-27 DIAGNOSIS — H43821 Vitreomacular adhesion, right eye: Secondary | ICD-10-CM | POA: Diagnosis not present

## 2021-09-27 DIAGNOSIS — Z9889 Other specified postprocedural states: Secondary | ICD-10-CM

## 2021-09-27 DIAGNOSIS — H33101 Unspecified retinoschisis, right eye: Secondary | ICD-10-CM

## 2021-09-27 DIAGNOSIS — H43822 Vitreomacular adhesion, left eye: Secondary | ICD-10-CM

## 2021-09-27 NOTE — Assessment & Plan Note (Signed)
Condition resolved after vitrectomy ?

## 2021-09-27 NOTE — Assessment & Plan Note (Signed)
OS with VMT with no foveal distortion and triggering macular retinoschisis or outer retinal changes present since initial evaluation here 04-14-2020 ? ?No interval change over time, with no secondary foveal macular schisis.  Therefore I do recommend continued observation as no impact on acuity at this time. ? ?OCT findings reviewed with patient and family ?

## 2021-09-27 NOTE — Assessment & Plan Note (Signed)
OD doing very well post vitrectomy for VMT ?

## 2021-09-27 NOTE — Progress Notes (Signed)
? ? ?09/27/2021 ? ?  ? ?CHIEF COMPLAINT ?Patient presents for  ?Chief Complaint  ?Patient presents with  ? Eye Problem  ? ? ? ? ?HISTORY OF PRESENT ILLNESS: ?Lynn Brown is a 88 y.o. female who presents to the clinic today for:  ? ?HPI   ?WIP, last seen 02/23/2021. Pt sqw Dr. Nile RiggsShapiro 09/18/21 and was advised to F/U for sooner appt with Dr Luciana Axeankin. ?Patient reports having flashes of light and floaters she has noticed in her right eye. Patient states Dr. Nile RiggsShapiro wanted her seen by Dr Luciana Axeankin sooner for "something about macular degeneration." ?Last edited by Nelva NayKronstein, Anna N on 09/27/2021  9:40 AM.  ?  ? ? ?Referring physician: ?Jethro BolusShapiro, Mark, MD ?[address] ?[place],  [place] [id number] ? ?HISTORICAL INFORMATION:  ? ?Selected notes from the MEDICAL RECORD NUMBER ?  ?   ? ?CURRENT MEDICATIONS: ?No current outpatient medications on file. (Ophthalmic Drugs)  ? ?No current facility-administered medications for this visit. (Ophthalmic Drugs)  ? ?Current Outpatient Medications (Other)  ?Medication Sig  ? Acetaminophen 500 MG capsule Take 500 mg by mouth every 6 (six) hours as needed for pain.  ? amLODipine (NORVASC) 2.5 MG tablet Take 1 tablet (2.5 mg total) by mouth daily.  ? apixaban (ELIQUIS) 5 MG TABS tablet Take 1 tablet (5 mg total) by mouth 2 (two) times daily.  ? ascorbic acid (VITAMIN C) 500 MG tablet Take 500 mg by mouth in the morning and at bedtime.  ? Calcium Citrate-Vitamin D 315-250 MG-UNIT TABS Take 1 tablet by mouth daily. (Patient not taking: Reported on 07/28/2021)  ? cetirizine (ZYRTEC) 10 MG tablet Take 10 mg by mouth daily as needed (allergies.).  (Patient not taking: Reported on 07/28/2021)  ? clorazepate (TRANXENE) 3.75 MG tablet   ? Coenzyme Q10 (COQ10 PO) Take 1 capsule by mouth daily at 2 PM.  ? dapagliflozin propanediol (FARXIGA) 10 MG TABS tablet Take 1 tablet (10 mg total) by mouth daily before breakfast.  ? furosemide (LASIX) 20 MG tablet TAKE 1 TABLET BY MOUTH AS NEEDED FOR FLUID OR EDEMA (Patient  not taking: Reported on 07/28/2021)  ? furosemide (LASIX) 40 MG tablet  (Patient not taking: Reported on 07/28/2021)  ? levothyroxine (SYNTHROID) 112 MCG tablet Take 112 mcg by mouth every morning.  ? magnesium oxide (MAG-OX) 400 MG tablet Take 400 mg by mouth every evening.   ? methocarbamol (ROBAXIN) 500 MG tablet Take 1 tablet (500 mg total) by mouth every 6 (six) hours as needed for muscle spasms.  ? metoprolol tartrate (LOPRESSOR) 25 MG tablet Take 0.5 tablets (12.5 mg total) by mouth 2 (two) times daily.  ? nitroGLYCERIN (NITROSTAT) 0.4 MG SL tablet Place 1 tablet (0.4 mg total) under the tongue every 5 (five) minutes as needed for chest pain.  ? potassium chloride (KLOR-CON) 10 MEQ tablet  (Patient not taking: Reported on 07/28/2021)  ? rosuvastatin (CRESTOR) 20 MG tablet Take 20 mg by mouth every evening.   ? spironolactone (ALDACTONE) 25 MG tablet TAKE 1 TABLET BY MOUTH  DAILY  ? VITAMIN D PO Take 1 tablet by mouth daily at 2 PM.  ? ?No current facility-administered medications for this visit. (Other)  ? ? ? ? ?REVIEW OF SYSTEMS: ?ROS   ?Negative for: Constitutional, Gastrointestinal, Neurological, Skin, Genitourinary, Musculoskeletal, HENT, Endocrine, Cardiovascular, Eyes, Respiratory, Psychiatric, Allergic/Imm, Heme/Lymph ?Last edited by Edmon Crapeankin, Jadis Mika A, MD on 09/27/2021 10:37 AM.  ?  ? ? ? ?ALLERGIES ?No Known Allergies ? ?PAST MEDICAL HISTORY ?  Past Medical History:  ?Diagnosis Date  ? Allergic rhinitis   ? Anxiety   ? Bunion   ? PES PLANUS  ? Hearing loss   ? 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   ? Hypercholesteremia   ? Hypertension   ? Hypothyroid   ? Leaky heart valve   ? DR. Sharyn Lull  ? Mild obesity   ? Osteoarthritis of knees, bilateral   ? Osteoporosis 12/2017  ? T score -3.3  ? Venous insufficiency   ? Vitreomacular traction, right 04/14/2020  ? Vitrectomy 08-03-20  ? ?Past Surgical History:  ?Procedure Laterality Date  ? ABDOMINAL AORTOGRAM W/LOWER EXTREMITY Left 05/04/2020  ? Procedure:  ABDOMINAL AORTOGRAM W/LOWER EXTREMITY;  Surgeon: Leonie Douglas, MD;  Location: [facility]AS[facility];  Service: Cardiovascular;  Laterality: Left;  ? CATARACT EXTRACTION, BILATERAL  2013  ? DR. Nile Riggs   ? HYSTEROSCOPY    ? D & C  ? LEFT HEART CATH AND CORONARY ANGIOGRAPHY N/A 03/10/2018  ? Procedure: LEFT HEART CATH AND CORONARY ANGIOGRAPHY;  Surgeon: Lyn Records, MD;  Location: [facility] INVASIVE CV LAB;  Service: Cardiovascular;  Laterality: N/A;  ? NEPHRECTOMY LIVING DONOR  1978  ? PERIPHERAL VASCULAR INTERVENTION Left 05/04/2020  ? Procedure: PERIPHERAL VASCULAR INTERVENTION;  Surgeon: Leonie Douglas, MD;  Location: [facility] IN[facility]AS[facility] CV [facility];  Service: Cardiovascular;  Laterality: Left;  Femoral popliteal  ? ? ?FAMILY HISTORY ?Family History  ?Problem Relation Age of Onset  ? Hypertension Mother   ? CVA Mother   ? Diabetes Sister   ? Hypertension Sister   ? Heart disease Sister   ? Multiple sclerosis Sister   ? Hypertension Father   ? CVA Father   ? Cancer Brother   ?     prostrate  ? Hypertension Brother   ? CVA Brother   ? Seizures Brother   ? Pancreatic cancer Sister   ? Kidney disease Sister   ? Kidney failure Sister   ? Other Brother   ?     PNEUMONIA AS A BABY  ? Hypertension Brother   ? Lymphoma Daughter   ? Deep vein thrombosis Sister   ? Pulmonary embolism Niece   ? Breast cancer Neg Hx   ? ? ?SOCIAL HISTORY ?Social History  ? ?Tobacco Use  ? Smoking status: Never  ? Smokeless tobacco: Never  ?Vaping Use  ? Vaping Use: Never used  ?Substance Use Topics  ? Alcohol use: No  ?  Alcohol/week: 0.0 standard drinks  ? Drug use: No  ? ?  ? ?  ? ?OPHTHALMIC EXAM: ? ?Base Eye Exam   ? ? Visual Acuity (ETDRS)   ? ?   Right Left  ? Dist cc 20/50 -2 20/30 -2  ? Dist ph cc 20/40 -1   ? ? Correction: Glasses  ? ?  ?  ? ? Tonometry (Tonopen, 9:44 AM)   ? ?   Right Left  ? Pressure 10 10  ? ?  ?  ? ? Pupils   ? ?   Pupils Dark Light Shape React APD  ? Right PERRL 4 3 Round Brisk None  ? Left PERRL 4 3 Round Brisk None  ? ?  ?   ? ? Extraocular Movement   ? ?   Right Left  ?  Full Full  ? ?  ?  ? ? Neuro/Psych   ? ? Oriented x3: Yes  ? Mood/Affect: Normal  ? ?  ?  ? ?  Dilation   ? ? Both eyes: 1.0% Mydriacyl, 2.5% Phenylephrine @ 9:44 AM  ? ?  ?  ? ?  ? ?Slit Lamp and Fundus Exam   ? ? External Exam   ? ?   Right Left  ? External Normal Normal  ? ?  ?  ? ? Slit Lamp Exam   ? ?   Right Left  ? Lids/Lashes Normal Normal  ? Conjunctiva/Sclera White and quiet White and quiet  ? Cornea Clear Clear  ? Anterior Chamber Deep and quiet Deep and quiet  ? Iris Round and reactive Round and reactive  ? Lens Posterior chamber intraocular lens Posterior chamber intraocular lens  ? Anterior Vitreous Normal Normal  ? ?  ?  ? ? Fundus Exam   ? ?   Right Left  ? Posterior Vitreous Clear, avitric Normal  ? Disc Normal Normal  ? C/D Ratio 0.5 0.5  ? Macula Normal Normal  ? Vessels Normal Normal  ? Periphery Normal Normal  ? ?  ?  ? ?  ? ? ?IMAGING AND PROCEDURES  ?Imaging and Procedures for 09/28/21 ? ?OCT, Retina - OU - Both Eyes   ? ?   ?Right Eye ?Quality was good. Scan locations included subfoveal. Central Foveal Thickness: 242. Progression has improved.  ? ?Left Eye ?Quality was good. Scan locations included subfoveal. Central Foveal Thickness: 272. Progression has been stable. Findings include normal observations, vitreous traction.  ? ?Notes ?No residual vitreal macular traction, no residual foveal macular schisis OD ? ?OS with VMT with no inner foveal distortion of the retina will observe, no change over the last several years, no foveal distortion no outer retinal disturbance continue to observe ? ?  ? ?Color Fundus Photography Optos - OU - Both Eyes   ? ?   ?Right Eye ?Progression has no prior data. Disc findings include normal observations. Macula : normal observations. Vessels : normal observations. Periphery : normal observations.  ? ?Left Eye ?Progression has no prior data. Disc findings include normal observations. Macula : normal observations.  Vessels : normal observations. Periphery : normal observations.  ? ?Notes ?Clear media OU. ? ?  ? ? ?  ?  ? ?  ?ASSESSMENT/PLAN: ? ?History of vitrectomy ?OD doing very well post vitrectomy for VMT ? ?Macular

## 2021-09-28 ENCOUNTER — Encounter (INDEPENDENT_AMBULATORY_CARE_PROVIDER_SITE_OTHER): Payer: Self-pay | Admitting: Ophthalmology

## 2021-10-10 DIAGNOSIS — I10 Essential (primary) hypertension: Secondary | ICD-10-CM | POA: Diagnosis not present

## 2021-10-10 DIAGNOSIS — E785 Hyperlipidemia, unspecified: Secondary | ICD-10-CM | POA: Diagnosis not present

## 2021-10-10 DIAGNOSIS — I5032 Chronic diastolic (congestive) heart failure: Secondary | ICD-10-CM | POA: Diagnosis not present

## 2021-10-10 DIAGNOSIS — M810 Age-related osteoporosis without current pathological fracture: Secondary | ICD-10-CM | POA: Diagnosis not present

## 2021-10-10 DIAGNOSIS — E039 Hypothyroidism, unspecified: Secondary | ICD-10-CM | POA: Diagnosis not present

## 2021-10-20 ENCOUNTER — Other Ambulatory Visit: Payer: Self-pay | Admitting: Interventional Cardiology

## 2021-11-08 ENCOUNTER — Ambulatory Visit: Payer: Medicare Other | Admitting: Podiatry

## 2021-11-21 ENCOUNTER — Ambulatory Visit: Payer: Medicare Other | Admitting: Podiatry

## 2021-11-21 DIAGNOSIS — I739 Peripheral vascular disease, unspecified: Secondary | ICD-10-CM | POA: Diagnosis not present

## 2021-11-21 DIAGNOSIS — R7303 Prediabetes: Secondary | ICD-10-CM | POA: Insufficient documentation

## 2021-11-21 DIAGNOSIS — M79675 Pain in left toe(s): Secondary | ICD-10-CM | POA: Diagnosis not present

## 2021-11-21 DIAGNOSIS — E559 Vitamin D deficiency, unspecified: Secondary | ICD-10-CM | POA: Insufficient documentation

## 2021-11-21 DIAGNOSIS — I2510 Atherosclerotic heart disease of native coronary artery without angina pectoris: Secondary | ICD-10-CM | POA: Insufficient documentation

## 2021-11-21 DIAGNOSIS — N3941 Urge incontinence: Secondary | ICD-10-CM | POA: Insufficient documentation

## 2021-11-21 DIAGNOSIS — Q828 Other specified congenital malformations of skin: Secondary | ICD-10-CM

## 2021-11-21 DIAGNOSIS — K5901 Slow transit constipation: Secondary | ICD-10-CM | POA: Insufficient documentation

## 2021-11-21 DIAGNOSIS — I471 Supraventricular tachycardia, unspecified: Secondary | ICD-10-CM | POA: Insufficient documentation

## 2021-11-21 DIAGNOSIS — E669 Obesity, unspecified: Secondary | ICD-10-CM | POA: Insufficient documentation

## 2021-11-21 DIAGNOSIS — N3281 Overactive bladder: Secondary | ICD-10-CM | POA: Insufficient documentation

## 2021-11-21 DIAGNOSIS — M79674 Pain in right toe(s): Secondary | ICD-10-CM | POA: Diagnosis not present

## 2021-11-21 DIAGNOSIS — F419 Anxiety disorder, unspecified: Secondary | ICD-10-CM | POA: Insufficient documentation

## 2021-11-21 DIAGNOSIS — B351 Tinea unguium: Secondary | ICD-10-CM | POA: Diagnosis not present

## 2021-11-21 DIAGNOSIS — M179 Osteoarthritis of knee, unspecified: Secondary | ICD-10-CM | POA: Insufficient documentation

## 2021-11-21 DIAGNOSIS — H9190 Unspecified hearing loss, unspecified ear: Secondary | ICD-10-CM | POA: Insufficient documentation

## 2021-11-21 DIAGNOSIS — K5904 Chronic idiopathic constipation: Secondary | ICD-10-CM | POA: Insufficient documentation

## 2021-11-21 DIAGNOSIS — I872 Venous insufficiency (chronic) (peripheral): Secondary | ICD-10-CM | POA: Insufficient documentation

## 2021-11-21 DIAGNOSIS — D72819 Decreased white blood cell count, unspecified: Secondary | ICD-10-CM | POA: Insufficient documentation

## 2021-11-21 DIAGNOSIS — R634 Abnormal weight loss: Secondary | ICD-10-CM | POA: Insufficient documentation

## 2021-11-21 DIAGNOSIS — R32 Unspecified urinary incontinence: Secondary | ICD-10-CM | POA: Insufficient documentation

## 2021-11-21 DIAGNOSIS — Z1211 Encounter for screening for malignant neoplasm of colon: Secondary | ICD-10-CM | POA: Insufficient documentation

## 2021-11-21 DIAGNOSIS — M810 Age-related osteoporosis without current pathological fracture: Secondary | ICD-10-CM | POA: Insufficient documentation

## 2021-11-21 DIAGNOSIS — Z86010 Personal history of colonic polyps: Secondary | ICD-10-CM | POA: Insufficient documentation

## 2021-11-27 ENCOUNTER — Other Ambulatory Visit: Payer: Self-pay

## 2021-11-27 MED ORDER — DAPAGLIFLOZIN PROPANEDIOL 10 MG PO TABS: 10.0000 mg | ORAL_TABLET | Freq: Every day | ORAL | 1 refills | Status: DC

## 2021-11-28 ENCOUNTER — Encounter: Payer: Self-pay | Admitting: Podiatry

## 2021-11-28 NOTE — Progress Notes (Signed)
  Subjective:  Patient ID: Lynn Brown, female    DOB: Sep 23, 1931,  MRN: [id number]  Lynn Brown presents to clinic today for for at risk foot care. Patient has h/o PAD and painful elongated mycotic toenails 1-5 bilaterally which are tender when wearing enclosed shoe gear. Pain is relieved with periodic professional debridement.  Patient is accompanied by her daughter on today's visit.  New problem(s): None.   PCP is Laurann Montana, MD , and last visit was Oct 10, 2021.  No Known Allergies  Review of Systems: Negative except as noted in the HPI.  Objective: No changes noted in today's physical examination.  Vascular Examination: CFT <4 seconds b/l. DP pulses faintly palpable b/l. PT pulses diminished b/l.  Skin temperature gradient warm to cool b/l. No ischemia or gangrene. No cyanosis or clubbing noted b/l. Pedal hair present.   Neurological Examination: Sensation grossly intact b/l with 10 gram monofilament. Vibratory sensation intact b/l.   Dermatological Examination: Pedal skin thin, shiny and atrophic b/l. Toenails 1-5 b/l thick, discolored, elongated with subungual debris and pain on dorsal palpation.  Porokeratotic lesion(s) medial IPJ of left great toe. No erythema, no edema, no drainage, no fluctuance.  Musculoskeletal Examination: Muscle strength 5/5 to b/l LE. HAV with bunion deformity noted b/l LE. Pes planus deformity noted bilateral LE.  Radiographs: None  Assessment/Plan: 1. Pain due to onychomycosis of toenails of both feet   2. Porokeratosis   3. PVD (peripheral vascular disease) (HCC)   -Patient was evaluated and treated. All patient's and/or POA's questions/concerns answered on today's visit. -Patient to continue soft, supportive shoe gear daily. -Mycotic toenails 1-5 bilaterally were debrided in length and girth with sterile nail nippers and dremel without incident. -Porokeratotic lesion(s) medial IPJ of left great toe pared and enucleated with sterile  scalpel blade without incident. Total number of lesions debrided=1. -Patient/POA to call should there be question/concern in the interim.   Return in about 3 months (around 02/21/2022).  Freddie Breech, DPM

## 2021-11-30 ENCOUNTER — Telehealth: Payer: Self-pay | Admitting: Interventional Cardiology

## 2021-11-30 MED ORDER — AMLODIPINE BESYLATE 2.5 MG PO TABS: 2.5000 mg | ORAL_TABLET | Freq: Every day | ORAL | 3 refills | Status: DC

## 2021-11-30 NOTE — Telephone Encounter (Signed)
Returned call to patient's daughter Lynn Brown (on [place]).  Lynn Brown reports patient's BP has been elevated with systolic readings of 150, 149, 172, 167 and diastolic readings of 90-100.  Lynn Brown states patient complains of occasional headache, denies SOB, CP or vision changes.  Reviewed medications with Lynn Brown and patient has not been taking Amlodipine though she and patient were not sure why. Lynn Brown asked if Dr. Katrinka Blazing had discontinued this medication. After reviewing last OV note on 07/28/21, per Dr. Katrinka Blazing: discontinue aspirin and plavix. For elevated BP patient to continue taking metoprolol, spironolactone and amlodipine.   I did not see any notes in chart discussing stopping Amlodipine. Patient has not been taking this medication. Sent a new refill request to pharmacy of choice for Amlodipine 2.5mg  QD. Advised Lynn Brown to have patient take this as prescribed per Dr. Katrinka Blazing for her BP and to call our office back if BP remains elevated. Lynn Brown verbalized understanding and expressed appreciation for assistance.  Will forward to Dr. Katrinka Blazing to review and advise.

## 2021-11-30 NOTE — Telephone Encounter (Signed)
  Pt c/o BP issue: STAT if pt c/o blurred vision, one-sided weakness or slurred speech  1. What are your last 5 BP readings?  upper number: 150 149 172 Lower number always: 100  BP while on the phone: 16/90  2. Are you having any other symptoms (ex. Dizziness, headache, blurred vision, passed out)? Headache sometimes   3. What is your BP issue? Pt's daughter said, pt BP been fluctuating up and done. She only feels headache but not all the time

## 2021-12-04 MED ORDER — AMLODIPINE BESYLATE 2.5 MG PO TABS: 5.0000 mg | ORAL_TABLET | Freq: Every day | ORAL | Status: DC

## 2021-12-04 NOTE — Telephone Encounter (Signed)
    Pt c/o BP issue: STAT if pt c/o blurred vision, one-sided weakness or slurred speech  1. What are your last 5 BP readings?  178/85 181/100 172/92 139/88 164/97 168/94  2. Are you having any other symptoms (ex. Dizziness, headache, blurred vision, passed out)? Dizziness yesterday, occasional headache   3. What is your BP issue? Daughter called for the patient. Patient is still having elevated BP even after taking new medication

## 2021-12-04 NOTE — Telephone Encounter (Signed)
Spoke with patient's daughter [name] who reports recent blood pressure readings from Saturday, Sunday and this morning.  Wilma reports she has checked patient's BP 3 times this morning, and that they have been checking it "constantly over the weekend because it was so high." Patient also listening and spoke, stating she took her Amlodipine this morning at 8:00AM.   Informed patient and daughter [name] that checking BP frequently can give elevated readings, and that it is best to check BP at least 2 hours after taking BP medications.  Patient has also been taking Amlodipine 2.5mg  QD since Friday 12/01/21.   Per Dr. Katrinka Blazing: Increase amlodipine to 5 mg daily. Continue to check BP at least 2 hours after morning medications is taken.   Patient and daughter verbalized understanding and will call with update towards the end of the week.

## 2021-12-16 DIAGNOSIS — M170 Bilateral primary osteoarthritis of knee: Secondary | ICD-10-CM | POA: Diagnosis not present

## 2021-12-16 DIAGNOSIS — M1711 Unilateral primary osteoarthritis, right knee: Secondary | ICD-10-CM | POA: Diagnosis not present

## 2021-12-16 DIAGNOSIS — M1712 Unilateral primary osteoarthritis, left knee: Secondary | ICD-10-CM | POA: Diagnosis not present

## 2022-01-05 ENCOUNTER — Encounter: Payer: Self-pay | Admitting: Interventional Cardiology

## 2022-01-05 ENCOUNTER — Ambulatory Visit: Payer: Medicare Other | Admitting: Interventional Cardiology

## 2022-01-05 VITALS — BP 112/68 | HR 68 | Ht 61.0 in | Wt 186.0 lb

## 2022-01-05 DIAGNOSIS — Z7901 Long term (current) use of anticoagulants: Secondary | ICD-10-CM

## 2022-01-05 DIAGNOSIS — E7800 Pure hypercholesterolemia, unspecified: Secondary | ICD-10-CM

## 2022-01-05 DIAGNOSIS — Z86711 Personal history of pulmonary embolism: Secondary | ICD-10-CM

## 2022-01-05 DIAGNOSIS — I10 Essential (primary) hypertension: Secondary | ICD-10-CM | POA: Diagnosis not present

## 2022-01-05 DIAGNOSIS — I5032 Chronic diastolic (congestive) heart failure: Secondary | ICD-10-CM | POA: Diagnosis not present

## 2022-01-05 DIAGNOSIS — I2541 Coronary artery aneurysm: Secondary | ICD-10-CM | POA: Diagnosis not present

## 2022-01-05 NOTE — Patient Instructions (Signed)

## 2022-01-05 NOTE — Progress Notes (Signed)
Cardiology Office Note:    Date:  01/05/2022   ID:  KALIS FRIESE, DOB Jul 18, 1931, MRN [id number]  PCP:  Laurann Montana, MD  Cardiologist:  Lesleigh Noe, MD   Referring MD: Laurann Montana, MD   Chief Complaint  Patient presents with   Coronary Artery Disease   Follow-up    Follow-up PE   Hypertension   Hyperlipidemia    History of Present Illness:    Lynn Brown is a 88 y.o. Brown with a hx of  nephrectomy as a kidney donor, hyperlipidemia, chronic lower extremity edema, hyperlipidemia, hypothyroidism, "leaky heart valve", LAD aneurysm/circumflex ectasia/RCA ectasia.  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  Eliquis was started as therapy for coronary microembolization from aneurysm and eventually discontinued.  Subsequent development of bilateral submassive pulmonary emboli and now back on Eliquis.  She is concerned about her blood pressures.  This morning before medications it was 159/90.  She has had other instances where her blood pressures can be as high as 170 mmHg.  The great majority of blood pressures have been in the 140-145/80 mmHg range.  She does not have orthostatic dizziness.  She does not feel bad.  She denies orthopnea and PND.  She has occasional left precordial chest pain that can occur spontaneously and resolves within 1 to 5 minutes.     Past Medical History:  Diagnosis Date   Allergic rhinitis    Anxiety    Bunion    PES PLANUS   Hearing loss    HEARING AIDS, BUT DOES NOT WEAR THEM CONSISTENTLY.    Hypercholesteremia    Hypertension    Hypothyroid    Leaky heart valve    DR. HARWANI   Mild obesity    Osteoarthritis of knees, bilateral    Osteoporosis 12/2017   T score -3.3   Venous insufficiency    Vitreomacular traction, right 04/14/2020   Vitrectomy 08-03-20    Past Surgical History:  Procedure Laterality Date   ABDOMINAL AORTOGRAM W/LOWER EXTREMITY Left 05/04/2020   Procedure: ABDOMINAL AORTOGRAM  W/LOWER EXTREMITY;  Surgeon: Leonie Douglas, MD;  Location: [facility];  Service: Cardiovascular;  Laterality: Left;   CATARACT EXTRACTION, BILATERAL  2013   DR. SHAPIRO    HYSTEROSCOPY     D & C   LEFT HEART CATH AND CORONARY ANGIOGRAPHY N/A 03/10/2018   Procedure: LEFT HEART CATH AND CORONARY ANGIOGRAPHY;  Surgeon: Lyn Records, MD;  Location: [facility];  Service: Cardiovascular;  Laterality: N/A;   NEPHRECTOMY LIVING DONOR  1978   PERIPHERAL VASCULAR INTERVENTION Left 05/04/2020   Procedure: PERIPHERAL VASCULAR INTERVENTION;  Surgeon: Leonie Douglas, MD;  Location: [facility];  Service: Cardiovascular;  Laterality: Left;  Femoral popliteal    Current Medications: Current Meds  Medication Sig   Acetaminophen 500 MG capsule Take 500 mg by mouth every 6 (six) hours as needed for pain.   alendronate (FOSAMAX) 70 MG tablet Take by mouth.   ALPRAZolam (XANAX) 0.5 MG tablet Take by mouth.   amLODipine (NORVASC) 2.5 MG tablet Take 2.5 mg by mouth daily. Pt. Takes 1 tablet 2.5 mg once a day.   apixaban (ELIQUIS) 5 MG TABS tablet Take 1 tablet (5 mg total) by mouth 2 (two) times daily.   ascorbic acid (VITAMIN C) 500 MG tablet Take 500 mg by mouth in the morning and at bedtime.   Calcium Citrate-Vitamin D 315-250 MG-UNIT TABS Take 1 tablet by  mouth daily.   cetirizine (ZYRTEC) 10 MG tablet Take 10 mg by mouth daily as needed (allergies.).   Cholecalciferol 50 MCG (2000 UT) TABS Take by mouth.   clorazepate (TRANXENE) 3.75 MG tablet    Coenzyme Q10 (COQ10 PO) Take 1 capsule by mouth daily at 2 PM.   dapagliflozin propanediol (FARXIGA) 10 MG TABS tablet Take 1 tablet (10 mg total) by mouth daily before breakfast.   denosumab (PROLIA) 60 MG/ML SOSY injection 60 mg   furosemide (LASIX) 20 MG tablet TAKE 1 TABLET BY MOUTH AS NEEDED FOR FLUID OR EDEMA   furosemide (LASIX) 40 MG tablet    levothyroxine (SYNTHROID) 112 MCG tablet Take 112 mcg by mouth every morning.    magnesium oxide (MAG-OX) 400 MG tablet Take 400 mg by mouth every evening.    methocarbamol (ROBAXIN) 500 MG tablet Take 1 tablet (500 mg total) by mouth every 6 (six) hours as needed for muscle spasms.   metoprolol tartrate (LOPRESSOR) 25 MG tablet Take 0.5 tablets (12.5 mg total) by mouth 2 (two) times daily.   nitroGLYCERIN (NITROSTAT) 0.4 MG SL tablet Place 1 tablet (0.4 mg total) under the tongue every 5 (five) minutes as needed for chest pain.   potassium chloride (KLOR-CON) 10 MEQ tablet    rosuvastatin (CRESTOR) 20 MG tablet Take 20 mg by mouth every evening.    spironolactone (ALDACTONE) 25 MG tablet TAKE 1 TABLET BY MOUTH  DAILY   traMADol (ULTRAM) 50 MG tablet Take 50 mg by mouth 3 (three) times daily as needed.   VITAMIN D PO Take 1 tablet by mouth daily at 2 PM.     Allergies:   Patient has no known allergies.   Social History   Socioeconomic History   Marital status: Widowed    Spouse name: Not on file   Number of children: 4   Years of education: Not on file   Highest education level: Not on file  Occupational History   Occupation: RETIRED  Tobacco Use   Smoking status: Never   Smokeless tobacco: Never  Vaping Use   Vaping Use: Never used  Substance and Sexual Activity   Alcohol use: No    Alcohol/week: 0.0 standard drinks of alcohol   Drug use: No   Sexual activity: Never    Comment: 1st intercourse 88 yo-Fewer than 5 partners  Other Topics Concern   Not on file  Social History Narrative   Not on file   Social Determinants of Health   Financial Resource Strain: Not on file  Food Insecurity: Not on file  Transportation Needs: Not on file  Physical Activity: Not on file  Stress: Not on file  Social Connections: Not on file     Family History: The patient's family history includes CVA in her brother, father, and mother; Cancer in her brother; Deep vein thrombosis in her sister; Diabetes in her sister; Heart disease in her sister; Hypertension in her  brother, brother, father, mother, and sister; Kidney disease in her sister; Kidney failure in her sister; Lymphoma in her daughter; Multiple sclerosis in her sister; Other in her brother; Pancreatic cancer in her sister; Pulmonary embolism in her niece; Seizures in her brother. There is no history of Breast cancer.  ROS:   Please see the history of present illness.    She is doing okay.  No blood in the urine or stool.  She has not needed nitroglycerin for episodes of pain since they are so brief in duration.  All  other systems reviewed and are negative.  EKGs/Labs/Other Studies Reviewed:    The following studies were reviewed today: No new data  EKG:  EKG no new tracing.  Recent Labs: 02/17/2021: ALT 12; Hemoglobin 12.9; Platelet Count 195 08/14/2021: BUN 22; Creatinine, Ser 0.89; Potassium 4.7; Sodium 142  Recent Lipid Panel No results found for: "CHOL", "TRIG", "HDL", "CHOLHDL", "VLDL", "LDLCALC", "LDLDIRECT"  Physical Exam:    VS:  BP 112/68   Pulse 68   Ht 5\' 1"  (1.549 m)   Wt 186 lb (84.4 kg)   SpO2 94%   BMI 35.14 kg/m     Wt Readings from Last 3 Encounters:  01/05/22 186 lb (84.4 kg)  07/28/21 185 lb 3.2 oz (84 kg)  03/28/21 186 lb 6.4 oz (84.6 kg)     GEN: Compatible with age. No acute distress HEENT: Normal NECK: No JVD. LYMPHATICS: No lymphadenopathy CARDIAC: No murmur. RRR no gallop, or edema. VASCULAR:  Normal Pulses. No bruits. RESPIRATORY:  Clear to auscultation without rales, wheezing or rhonchi  ABDOMEN: Soft, non-tender, non-distended, No pulsatile mass, MUSCULOSKELETAL: No deformity  SKIN: Warm and dry NEUROLOGIC:  Alert and oriented x 3 PSYCHIATRIC:  Normal affect   ASSESSMENT:    1. Essential hypertension   2. Coronary aneurysm   3. Chronic diastolic heart failure (HCC)   4. Chronic anticoagulation   5. Hypercholesteremia   6. History of pulmonary embolism    PLAN:    In order of problems listed above:  I explained to her that blood  pressures taken upon awakening but before she has taken her morning medications will tend to be somewhat higher and medically treating those blood pressures could cause much lower blood pressures after the medication is in effect.  I advised measuring blood pressures at least 2 hours after a.m. medications have been taken.  Her blood pressure target for age is 130 to 13/01/22 systolic over 70 to 90 mmHg diastolic.  Low-salt diet.  I reviewed multiple blood pressure recordings.  She has had random blood pressures that are 170.  I explained that there can be some variability but there is no reason to uptitrate medical therapy when the great majority are within her target.  We have done that in the past and had to significantly lower amlodipine. Chest pain episodes could be related to the coronary aneurysm and embolic events although this seems unlikely. Breathing is not an issue Continue apixaban Continue to eat properly and stay on rosuvastatin. Continue Eliquis therapy.  Significant time spent explaining blood pressure and the inability for it to be within the normal range all the time.  She may be measuring her blood pressure too frequently.  Clinical follow-up in 1 year.   Target BP: <130/80 mmHg  Diet and lifestyle measures for BP control were reviewed in detail: Low sodium diet (<2.5 gm daily); alcohol restriction (<3 ounces per day); weight loss (Mediterranean); avoid non-steroidal agents; > 6 hours sleep per day; 150 min moderate exercise per week. Medical regimen will include at least 2 agents. Resistant hypertension if not controlled on 3 agents. Consider further evaluation: Sleep study to r/o OSA; Renal angiogram; Primary hyperaldonism and Pheochromocytoma w/u. After 3 agents, consider MRA (spironolactone)/ Epleronone), hydralazine, beta-blocker, and Minoxidil if not already in use due to patient profile.     Medication Adjustments/Labs and Tests Ordered: Current medicines are reviewed  at length with the patient today.  Concerns regarding medicines are outlined above.  No orders of the defined types were placed  in this encounter.  No orders of the defined types were placed in this encounter.   Patient Instructions  Medication Instructions:  Your physician recommends that you continue on your current medications as directed. Please refer to the Current Medication list given to you today.  *If you need a refill on your cardiac medications before your next appointment, please call your pharmacy*  Lab Work: NONE  Testing/Procedures: NONE  Follow-Up: At [facility], you and your health needs are our priority.  As part of our continuing mission to provide you with exceptional heart care, we have created designated Provider Care Teams.  These Care Teams include your primary Cardiologist (physician) and Advanced Practice Providers (APPs -  Physician Assistants and Nurse Practitioners) who all work together to provide you with the care you need, when you need it.  Your next appointment:   1 year(s)  The format for your next appointment:   In Person  Provider:   Lesleigh Noe, MD {  Important Information About Sugar         Signed, Lesleigh Noe, MD  01/05/2022 10:33 AM    [facility]

## 2022-01-10 DIAGNOSIS — M810 Age-related osteoporosis without current pathological fracture: Secondary | ICD-10-CM | POA: Diagnosis not present

## 2022-01-10 DIAGNOSIS — I2510 Atherosclerotic heart disease of native coronary artery without angina pectoris: Secondary | ICD-10-CM | POA: Diagnosis not present

## 2022-01-10 DIAGNOSIS — E785 Hyperlipidemia, unspecified: Secondary | ICD-10-CM | POA: Diagnosis not present

## 2022-01-10 DIAGNOSIS — E039 Hypothyroidism, unspecified: Secondary | ICD-10-CM | POA: Diagnosis not present

## 2022-01-10 DIAGNOSIS — E559 Vitamin D deficiency, unspecified: Secondary | ICD-10-CM | POA: Diagnosis not present

## 2022-01-10 DIAGNOSIS — I10 Essential (primary) hypertension: Secondary | ICD-10-CM | POA: Diagnosis not present

## 2022-01-10 DIAGNOSIS — I471 Supraventricular tachycardia: Secondary | ICD-10-CM | POA: Diagnosis not present

## 2022-01-10 DIAGNOSIS — I129 Hypertensive chronic kidney disease with stage 1 through stage 4 chronic kidney disease, or unspecified chronic kidney disease: Secondary | ICD-10-CM | POA: Diagnosis not present

## 2022-01-10 DIAGNOSIS — N1831 Chronic kidney disease, stage 3a: Secondary | ICD-10-CM | POA: Diagnosis not present

## 2022-01-10 DIAGNOSIS — Z0000 Encounter for general adult medical examination without abnormal findings: Secondary | ICD-10-CM | POA: Diagnosis not present

## 2022-01-10 DIAGNOSIS — I5032 Chronic diastolic (congestive) heart failure: Secondary | ICD-10-CM | POA: Diagnosis not present

## 2022-01-10 DIAGNOSIS — R7303 Prediabetes: Secondary | ICD-10-CM | POA: Diagnosis not present

## 2022-01-11 ENCOUNTER — Other Ambulatory Visit: Payer: Self-pay | Admitting: Family Medicine

## 2022-01-11 DIAGNOSIS — M810 Age-related osteoporosis without current pathological fracture: Secondary | ICD-10-CM

## 2022-01-12 ENCOUNTER — Telehealth: Payer: Self-pay | Admitting: Interventional Cardiology

## 2022-01-12 MED ORDER — METOPROLOL TARTRATE 25 MG PO TABS: 12.5000 mg | ORAL_TABLET | Freq: Two times a day (BID) | ORAL | 3 refills | Status: DC

## 2022-01-12 NOTE — Telephone Encounter (Signed)
*  STAT* If patient is at the pharmacy, call can be transferred to refill team.   1. Which medications need to be refilled? (please list name of each medication and dose if known) metoprolol tartrate (LOPRESSOR) 25 MG tablet   2. Which pharmacy/location (including street and city if local pharmacy) is medication to be sent to? [facility] - [place], [place] - [address]   3. Do they need a 30 day or 90 day supply? 90  

## 2022-01-12 NOTE — Telephone Encounter (Signed)
Pt's medication was sent to pt's pharmacy as requested. Confirmation received.  °

## 2022-01-24 ENCOUNTER — Ambulatory Visit: Payer: Medicare Other | Admitting: Interventional Cardiology

## 2022-02-13 ENCOUNTER — Other Ambulatory Visit: Payer: Self-pay | Admitting: Interventional Cardiology

## 2022-02-13 DIAGNOSIS — I2694 Multiple subsegmental pulmonary emboli without acute cor pulmonale: Secondary | ICD-10-CM

## 2022-02-13 NOTE — Telephone Encounter (Signed)
Eliquis 5mg  refill request received. Patient is 88 years old, weight-84.4kg, Crea-0.89 on 08/14/2021, Diagnosis- Eliquis was started as therapy for coronary microembolization from aneurysm and eventually discontinued.  Subsequent development of bilateral submassive pulmonary emboli and now back on Eliquis, and last seen by Dr. Tamala Julian on 01/05/2022. Dose is appropriate based on dosing criteria. Will send in refill to requested pharmacy.

## 2022-02-27 ENCOUNTER — Ambulatory Visit: Payer: Medicare Other | Admitting: Podiatry

## 2022-03-01 ENCOUNTER — Encounter (INDEPENDENT_AMBULATORY_CARE_PROVIDER_SITE_OTHER): Payer: Medicare Other | Admitting: Ophthalmology

## 2022-03-16 DIAGNOSIS — K5904 Chronic idiopathic constipation: Secondary | ICD-10-CM | POA: Diagnosis not present

## 2022-03-16 DIAGNOSIS — N3281 Overactive bladder: Secondary | ICD-10-CM | POA: Diagnosis not present

## 2022-03-29 DIAGNOSIS — M810 Age-related osteoporosis without current pathological fracture: Secondary | ICD-10-CM | POA: Diagnosis not present

## 2022-04-02 ENCOUNTER — Encounter (INDEPENDENT_AMBULATORY_CARE_PROVIDER_SITE_OTHER): Payer: Medicare Other | Admitting: Ophthalmology

## 2022-04-11 DIAGNOSIS — H43823 Vitreomacular adhesion, bilateral: Secondary | ICD-10-CM | POA: Diagnosis not present

## 2022-04-11 DIAGNOSIS — H04123 Dry eye syndrome of bilateral lacrimal glands: Secondary | ICD-10-CM | POA: Diagnosis not present

## 2022-04-28 ENCOUNTER — Telehealth: Payer: Self-pay | Admitting: Student in an Organized Health Care Education/Training Program

## 2022-04-28 NOTE — Telephone Encounter (Signed)
Paged by operator that patient was having nose bleeds.   Spoke with Jacqualyn Posey who reports her mom had 3 nose bleeds and "coughed up some blood" last night. She stopped taking the eliquis (last dose 04/27/22 AM). She is on eliquis for coronary microembolization from aneurysms and subsequently d/c. Then restarted for b/l submassive PE (08/09/20). Last epistaxis earlier this evening. Asked her to hold it tomorrow and monitor for recurrent bleeding. If no recurrent bleeding then would resume Monday morning. If recurrent bleeding or ongoing epistaxis then hold eliquis and she will reach out to the office Monday. Discussed how this is a balance between recurrent PE/thromboembolic risk and bleeding risk which her and her daughter understand.

## 2022-05-14 DIAGNOSIS — M1711 Unilateral primary osteoarthritis, right knee: Secondary | ICD-10-CM | POA: Diagnosis not present

## 2022-05-14 DIAGNOSIS — M1712 Unilateral primary osteoarthritis, left knee: Secondary | ICD-10-CM | POA: Diagnosis not present

## 2022-05-14 DIAGNOSIS — M170 Bilateral primary osteoarthritis of knee: Secondary | ICD-10-CM | POA: Diagnosis not present

## 2022-05-15 ENCOUNTER — Other Ambulatory Visit: Payer: Self-pay | Admitting: Family Medicine

## 2022-05-15 DIAGNOSIS — Z1231 Encounter for screening mammogram for malignant neoplasm of breast: Secondary | ICD-10-CM

## 2022-06-07 IMAGING — CT CT ANGIO CHEST
2 of 6 series · 18 of 36 positions shown · IV contrast (omnipaque)
Comparison: Chest x-ray today

CLINICAL DATA: Chest pain, shortness of breath

EXAM:
CT ANGIOGRAPHY CHEST WITH CONTRAST
TECHNIQUE: Multidetector CT imaging of the chest was performed using the
standard protocol during bolus administration of intravenous
contrast. Multiplanar CT image reconstructions and MIPs were
obtained to evaluate the vascular anatomy.
CONTRAST:  74mL OMNIPAQUE IOHEXOL 350 MG/ML SOLN

[Series 7: pe thins · axial · 0.71mm/px · z∈[+1229,+1468]mm · 17 of 380 slices shown]
[im 19/380  lung]
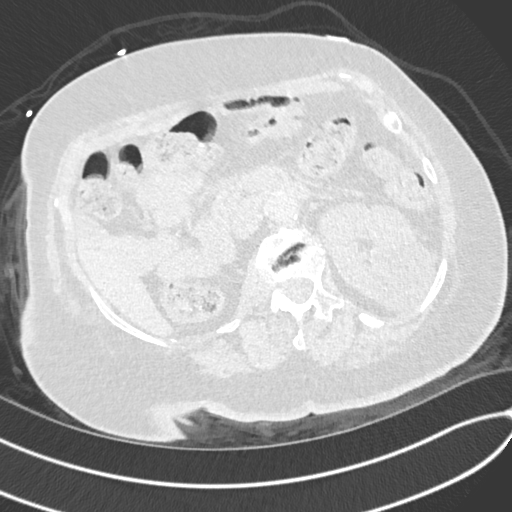
[im 38/380  mediastinal]
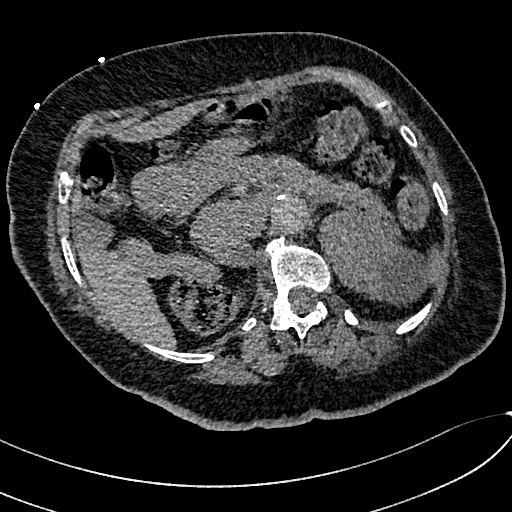
[im 57/380  lung]
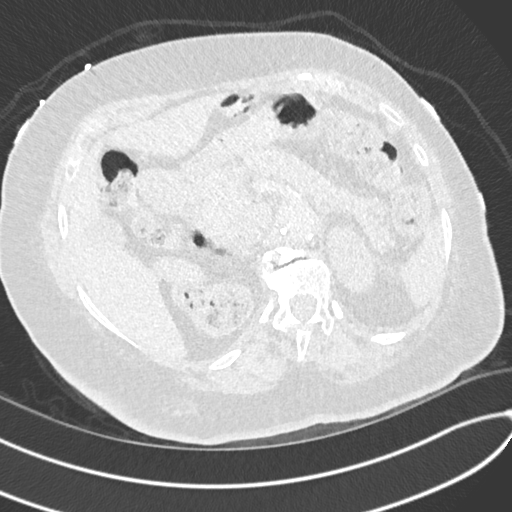
[im 76/380  mediastinal]
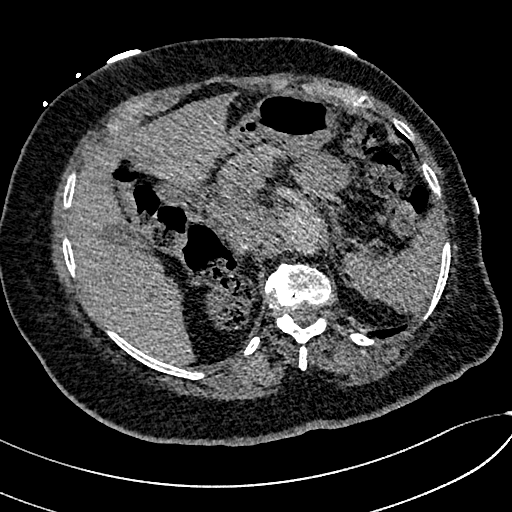
[im 114/380  lung]
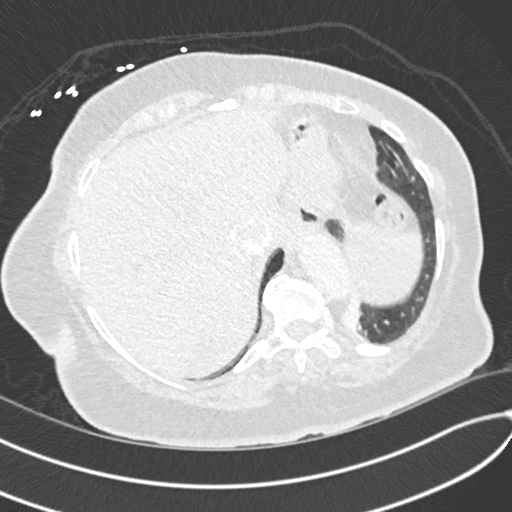
[im 133/380  mediastinal]
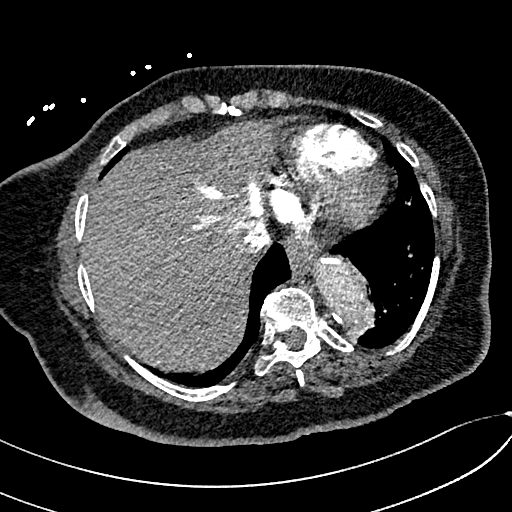
[im 152/380  lung]
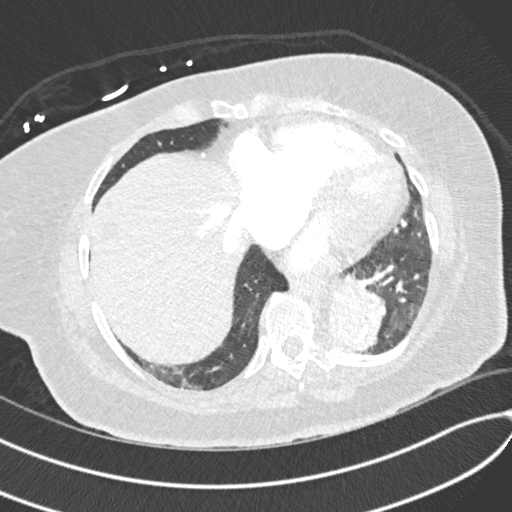
[im 171/380  mediastinal]
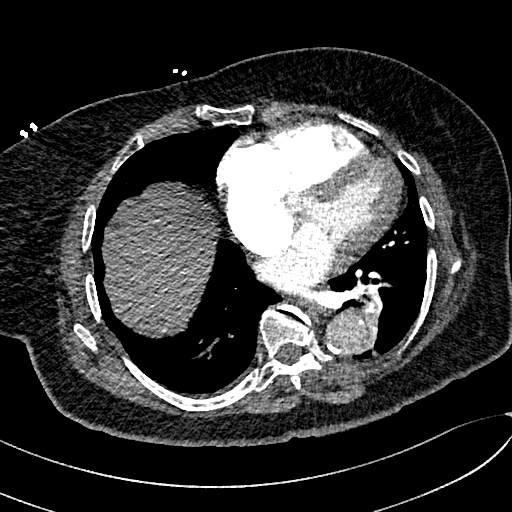
[im 190/380  lung]
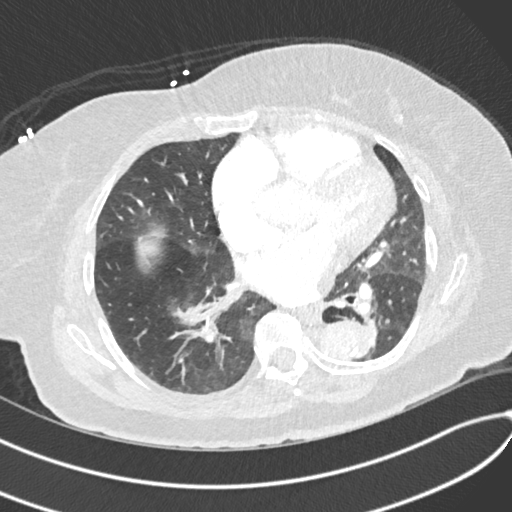
[im 209/380  mediastinal]
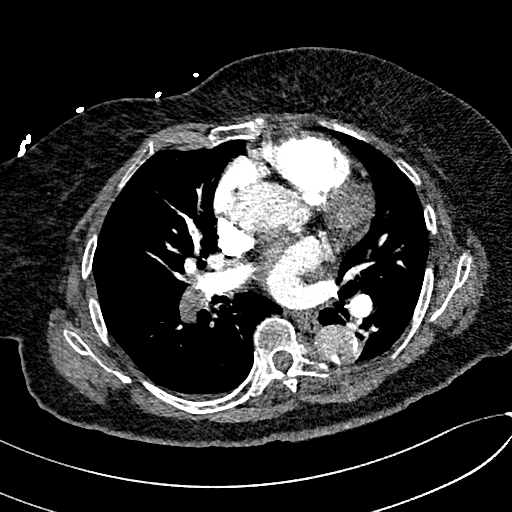
[im 228/380  lung]
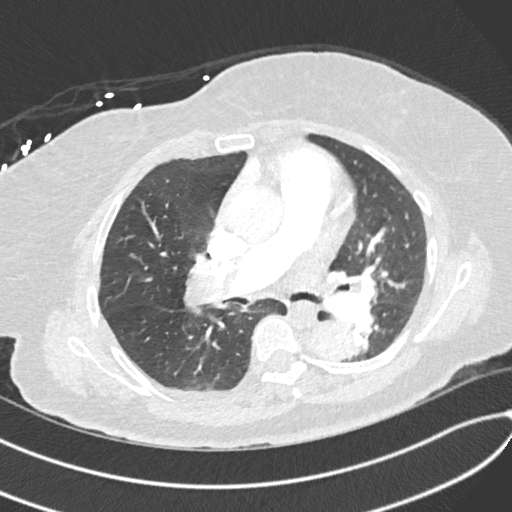
[im 247/380  mediastinal]
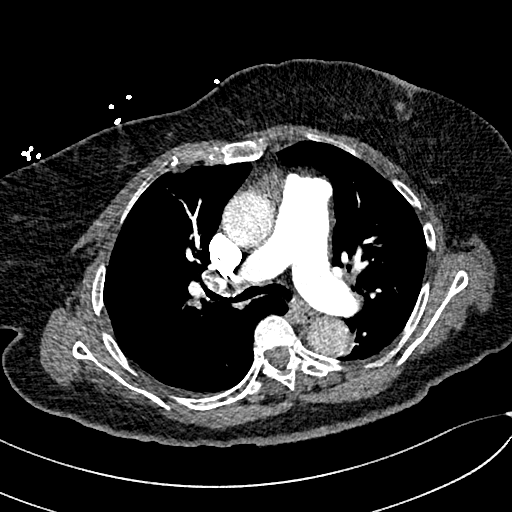
[im 266/380  lung]
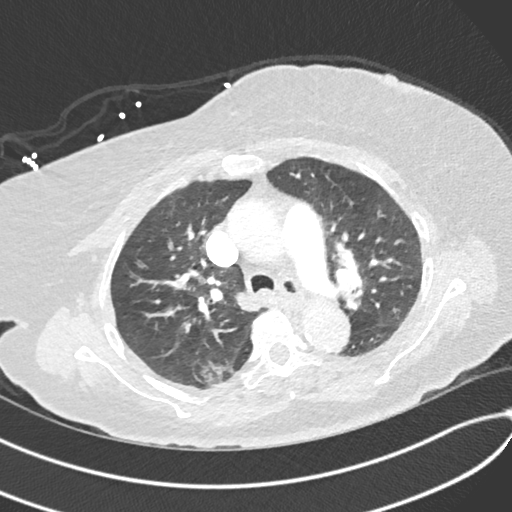
[im 304/380  mediastinal]
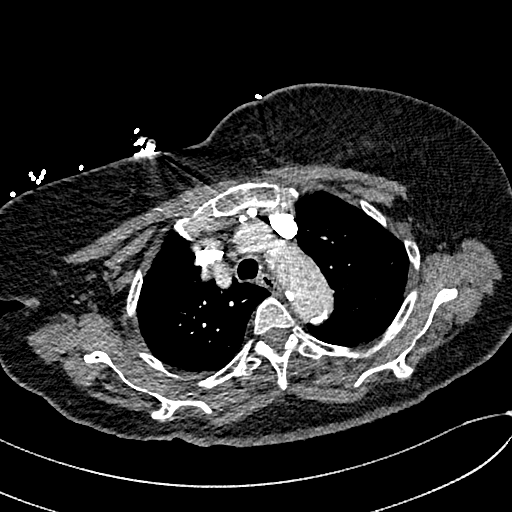
[im 323/380  lung]
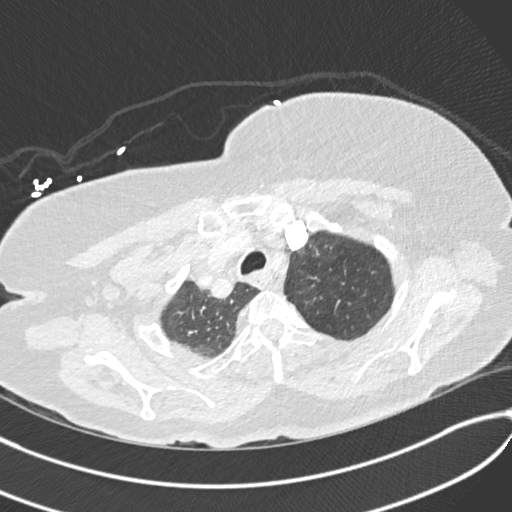
[im 342/380  mediastinal]
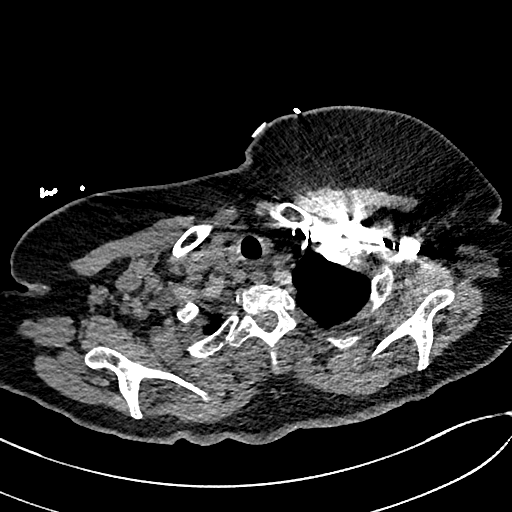
[im 361/380  lung]
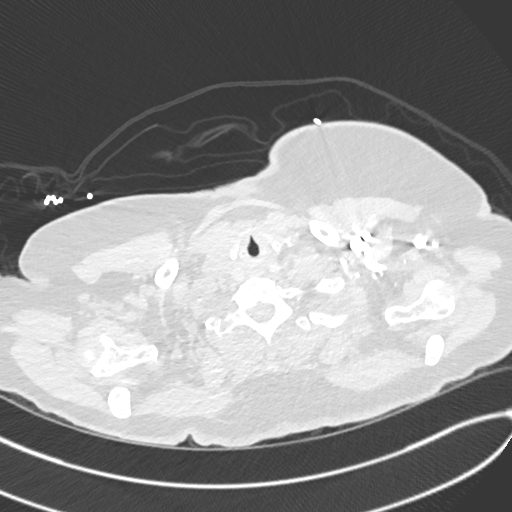

[Series 8: pe 2mm cor · coronal · 0.59mm/px · 1 of 143 slices shown]
[im 72/143  mediastinal]
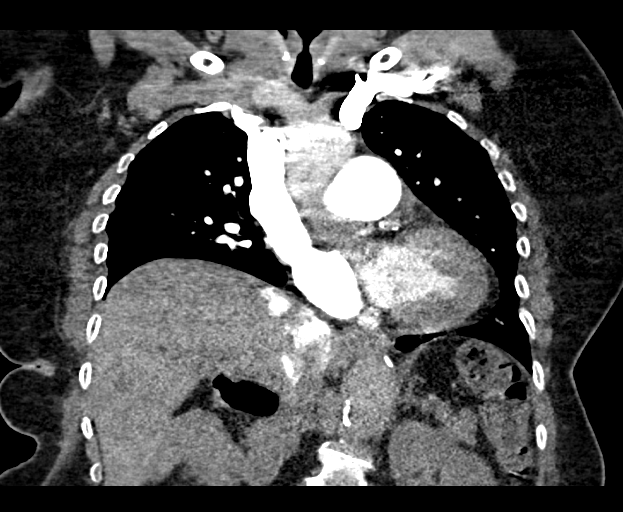

[18 of 36 positions shown; findings below may reference images not displayed]

FINDINGS: Cardiovascular: Extensive pulmonary embolus noted in the right
pulmonary arteries including right main pulmonary artery with
occlusion of the right lower lobe pulmonary artery. Pulmonary emboli
also noted in the right upper lobe. No pulmonary emboli on the left.
Evidence of right heart strain with an RV/LV ratio of 1.22. Reflux
of contrast into the IVC and hepatic veins suggests right heart
dysfunction. Coronary artery and aortic atherosclerosis. No aortic
aneurysm.

Mediastinum/Nodes: No mediastinal, hilar, or axillary adenopathy.
Trachea and esophagus are unremarkable. Thyroid unremarkable.

Lungs/Pleura: Atelectasis noted medially and posteriorly in the left
lung base. No effusions or confluent opacities otherwise.

Upper Abdomen: Scattered hypodensities throughout the liver cannot
be characterized on this study, favor cysts.

Musculoskeletal: Chest wall soft tissues are unremarkable. No acute
bony abnormality.

Review of the MIP images confirms the above findings.
IMPRESSION: Extensive pulmonary emboli within the right lung. CT evidence of
right heart strain (RV/LV Ratio = 1.22) consistent with at least
submassive (intermediate risk) PE. The presence of right heart
strain has been associated with an increased risk of morbidity and
mortality.

Coronary artery disease.

Atelectasis in the left lower lobe.

Aortic Atherosclerosis ([id number]-31L.L).

These results were called by telephone at the time of interpretation
on 08/09/2020 at [DATE] to provider QWEEN MARCHAND , who verbally
acknowledged these results.

## 2022-06-14 ENCOUNTER — Telehealth: Payer: Self-pay | Admitting: Interventional Cardiology

## 2022-06-14 NOTE — Telephone Encounter (Signed)
Patient would like to start seeing Dr. Johney Frame. Please advise

## 2022-06-14 NOTE — Telephone Encounter (Signed)
Patient's primary cardiologist updated.

## 2022-06-26 ENCOUNTER — Ambulatory Visit
Admission: RE | Admit: 2022-06-26 | Discharge: 2022-06-26 | Disposition: A | Discharge status: Home / Self Care | Payer: Medicare Other | Source: Ambulatory Visit | Attending: Family Medicine | Admitting: Family Medicine

## 2022-06-26 DIAGNOSIS — Z780 Asymptomatic menopausal state: Secondary | ICD-10-CM | POA: Diagnosis not present

## 2022-06-26 DIAGNOSIS — M8589 Other specified disorders of bone density and structure, multiple sites: Secondary | ICD-10-CM | POA: Diagnosis not present

## 2022-06-26 DIAGNOSIS — M810 Age-related osteoporosis without current pathological fracture: Secondary | ICD-10-CM

## 2022-07-02 ENCOUNTER — Other Ambulatory Visit: Payer: Self-pay | Admitting: *Deleted

## 2022-07-02 MED ORDER — SPIRONOLACTONE 25 MG PO TABS: 25.0000 mg | ORAL_TABLET | Freq: Every day | ORAL | 0 refills | Status: DC

## 2022-07-03 ENCOUNTER — Encounter (INDEPENDENT_AMBULATORY_CARE_PROVIDER_SITE_OTHER): Payer: Medicare Other | Admitting: Ophthalmology

## 2022-07-07 NOTE — Progress Notes (Unsigned)
Cardiology Office Note:    Date:  07/09/2022   ID:  Lynn Brown, DOB 10/02/1931, MRN [id number]  PCP:  Freada Bergeron, MD   [facility] Providers Cardiologist:  Freada Bergeron, MD     Referring MD: Harlan Stains, MD   Chief Complaint:   History of Present Illness:    Lynn Brown is a pleasant 89 y.o. female with a hx of nephrectomy as a kidney donor, hyperlipidemia, PAD s/p L SFA and L popliteal stenting 04/2020, chronic lower extremity edema, HLD, "leaky heart valve," hypothyroidism, LAD aneurysm/circumflex ectasia/RCA ectasia. 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  Eliquis was started as therapy for coronary microembolization from aneurysm and eventually discontinued. Subsequent development of bilateral submassive pulmonary emboli placed back on Eliquis.  NSTEMI October 2019 with cath that revealed LAD aneurysm, LCx and RCA ectasia, presumed embolic microembolization which was treated with Eliquis.  Admission 3/15-3/18/2022 with submassive pulmonary embolism with associated RV strain and severe pulmonary hypertension. Her PE was felt to be unprovoked and will require lifelong anticoagulation.  Last cardiology clinic visit was 01/05/2022 with Dr. Tamala Julian.  BP had been elevated 140-125/80 with readings as high as 170 mmHg.  Reported occasional left precordial chest pain that can occur spontaneously and resolves within 1 to 5 minutes.  Prior notes, Dr. Tamala Julian spent a significant time explaining blood pressure variability and to monitor 2 hours after morning medications.  He felt she may be measuring her BP too frequently and did not want to overmedicate BPs that were taken prior to taking medications.  Target BP < 150. One year follow-up was recommended.  Her daughter contacted our on-call provider on 04/28/2022 to report 3 nosebleeds and "coughed up some blood" the previous night.  She stopped taking Eliquis on 04/27/2022. She was advised to  hold Eliquis for that day and then resume and if bleeding continued to contact our office.  Today, she is here for evaluation of concerns with recent BP and is accompanied by her daughter. Reports BP at home is labile. Has a headache with elevated BP, this occurs occasionally. Reports chronic shortness of breath that she feels is stable, does a little bit of work and then has to rest. Takes Lasix as needed, a few times per week. Denies chest pain. Has some left arm pain that comes from her upper arm, does not come from her chest. She denies palpitations, diaphoresis, weakness, presyncope, syncope, orthopnea, and PND. No further episodes of bleeding.   Past Medical History:  Diagnosis Date   Allergic rhinitis    Anxiety    Bunion    PES PLANUS   Hearing loss    HEARING AIDS, BUT DOES NOT WEAR THEM CONSISTENTLY.    Hypercholesteremia    Hypertension    Hypothyroid    Leaky heart valve    DR. HARWANI   Mild obesity    Osteoarthritis of knees, bilateral    Osteoporosis 12/2017   T score -3.3   Venous insufficiency    Vitreomacular traction, right 04/14/2020   Vitrectomy 08-03-20    Past Surgical History:  Procedure Laterality Date   ABDOMINAL AORTOGRAM W/LOWER EXTREMITY Left 05/04/2020   Procedure: ABDOMINAL AORTOGRAM W/LOWER EXTREMITY;  Surgeon: Cherre Robins, MD;  Location: [place] [facility];  Service: Cardiovascular;  Laterality: Left;   CATARACT EXTRACTION, BILATERAL  2013   DR. SHAPIRO    HYSTEROSCOPY     D & C   LEFT HEART CATH AND CORONARY  ANGIOGRAPHY N/A 03/10/2018   Procedure: LEFT HEART CATH AND CORONARY ANGIOGRAPHY;  Surgeon: Belva Crome, MD;  Location: [place] [facility];  Service: Cardiovascular;  Laterality: N/A;   NEPHRECTOMY LIVING DONOR  1978   PERIPHERAL VASCULAR INTERVENTION Left 05/04/2020   Procedure: PERIPHERAL VASCULAR INTERVENTION;  Surgeon: Cherre Robins, MD;  Location: [place] [facility];  Service: Cardiovascular;  Laterality: Left;  Femoral popliteal     Current Medications: Current Meds  Medication Sig   Acetaminophen 500 MG capsule Take 500 mg by mouth every 6 (six) hours as needed for pain.   alendronate (FOSAMAX) 70 MG tablet Take by mouth.   ALPRAZolam (XANAX) 0.5 MG tablet Take by mouth.   amLODipine (NORVASC) 2.5 MG tablet Take 2.5 mg by mouth daily. Pt. Takes 1 tablet 2.5 mg once a day.   ascorbic acid (VITAMIN C) 500 MG tablet Take 500 mg by mouth in the morning and at bedtime.   Calcium Citrate-Vitamin D 315-250 MG-UNIT TABS Take 1 tablet by mouth daily.   cetirizine (ZYRTEC) 10 MG tablet Take 10 mg by mouth daily as needed (allergies.).   Cholecalciferol 50 MCG (2000 UT) TABS Take by mouth.   clorazepate (TRANXENE) 3.75 MG tablet    Coenzyme Q10 (COQ10 PO) Take 1 capsule by mouth daily at 2 PM.   dapagliflozin propanediol (FARXIGA) 10 MG TABS tablet Take 1 tablet (10 mg total) by mouth daily before breakfast.   denosumab (PROLIA) 60 MG/ML SOSY injection 60 mg   furosemide (LASIX) 20 MG tablet TAKE 1 TABLET BY MOUTH AS NEEDED FOR FLUID OR EDEMA   furosemide (LASIX) 40 MG tablet    levothyroxine (SYNTHROID) 112 MCG tablet Take 112 mcg by mouth every morning.   magnesium oxide (MAG-OX) 400 MG tablet Take 400 mg by mouth every evening.    methocarbamol (ROBAXIN) 500 MG tablet Take 1 tablet (500 mg total) by mouth every 6 (six) hours as needed for muscle spasms.   metoprolol tartrate (LOPRESSOR) 25 MG tablet Take 0.5 tablets (12.5 mg total) by mouth 2 (two) times daily.   nitroGLYCERIN (NITROSTAT) 0.4 MG SL tablet Place 1 tablet (0.4 mg total) under the tongue every 5 (five) minutes as needed for chest pain.   potassium chloride (KLOR-CON) 10 MEQ tablet    rosuvastatin (CRESTOR) 20 MG tablet Take 20 mg by mouth every evening.    spironolactone (ALDACTONE) 25 MG tablet Take 1 tablet (25 mg total) by mouth daily. Please keep upcoming appointment in February for future refills. Thank you   traMADol (ULTRAM) 50 MG tablet Take 50 mg  by mouth 3 (three) times daily as needed.   trospium (SANCTURA) 20 MG tablet Take 20 mg by mouth 2 (two) times daily.   VITAMIN D PO Take 1 tablet by mouth daily at 2 PM.   [DISCONTINUED] apixaban (ELIQUIS) 5 MG TABS tablet TAKE 1 TABLET BY MOUTH TWICE  DAILY     Allergies:   Patient has no known allergies.   Social History   Socioeconomic History   Marital status: Widowed    Spouse name: Not on file   Number of children: 4   Years of education: Not on file   Highest education level: Not on file  Occupational History   Occupation: RETIRED  Tobacco Use   Smoking status: Never   Smokeless tobacco: Never  Vaping Use   Vaping Use: Never used  Substance and Sexual Activity   Alcohol use: No    Alcohol/week: 0.0 standard  drinks of alcohol   Drug use: No   Sexual activity: Never    Comment: 1st intercourse 89 yo-Fewer than 5 partners  Other Topics Concern   Not on file  Social History Narrative   Not on file   Social Determinants of Health   Financial Resource Strain: Not on file  Food Insecurity: Not on file  Transportation Needs: Not on file  Physical Activity: Not on file  Stress: Not on file  Social Connections: Not on file     Family History: The patient's family history includes CVA in her brother, father, and mother; Cancer in her brother; Deep vein thrombosis in her sister; Diabetes in her sister; Heart disease in her sister; Hypertension in her brother, brother, father, mother, and sister; Kidney disease in her sister; Kidney failure in her sister; Lymphoma in her daughter; Multiple sclerosis in her sister; Other in her brother; Pancreatic cancer in her sister; Pulmonary embolism in her niece; Seizures in her brother. There is no history of Breast cancer.  ROS:   Please see the history of present illness.  + bilateral LE edema  + chronic shortness of breath All other systems reviewed and are negative.  Labs/Other Studies Reviewed:    The following studies were  reviewed today:  Echo 11/09/20 1. Left ventricular ejection fraction, by estimation, is 55 to 60%. Left  ventricular ejection fraction by 3D volume is 56 %. The left ventricle has  normal function. The left ventricle has no regional wall motion  abnormalities. There is mild left  ventricular hypertrophy. Left ventricular diastolic parameters are  consistent with Grade I diastolic dysfunction (impaired relaxation). There  is the interventricular septum is flattened in systole, consistent with  right ventricular pressure overload.   2. Right ventricular systolic function is mildly reduced. The right  ventricular size is normal. There is moderately elevated pulmonary artery  systolic pressure. The estimated right ventricular systolic pressure is  0000000 mmHg.   3. The mitral valve is abnormal. Trivial mitral valve regurgitation.   4. The aortic valve is tricuspid. Aortic valve regurgitation is not  visualized.   Comparison(s): Changes from prior study are noted. 08/10/20 EF 55-60%. PA  pressure 46mHg.  Coronary artery ectasia involving the proximal and mid circumflex as well as the proximal to mid RCA.  The left circumflex appears to have eccentric globular filling defect consistent with thrombus.  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  Both vessels have slow filling and slow flow. Fusiform aneurysm involving the proximal to mid LAD Normal left main No obstructive coronary lesions identified. Normal left ventricular function. Supraventricular tachycardia 140 bpm upon entering the Cath Lab.  15 mg of IV metoprolol was given and 5 mg aliquots over 15 minutes and led to resolution.   RECOMMENDATIONS:   Dual antiplatelet therapy with aspirin and Plavix versus anticoagulation with warfarin/NOAC -there is more data in favor of anticoagulation than dual antiplatelet therapy. Would not recommend percutaneous  intervention and circumflex.  If LAD aneurysm grows, could consider stent graft.  Aneurysm can be followed by coronary CTA if desired.   Recommend Aspirin 89mdaily for moderate CAD.   Diagnostic Dominance: Right   Recent Labs: 08/14/2021: BUN 22; Creatinine, Ser 0.89; Potassium 4.7; Sodium 142  Recent Lipid Panel No results found for: "CHOL", "TRIG", "HDL", "CHOLHDL", "VLDL", "LDLCALC", "LDLDIRECT"   Risk Assessment/Calculations:       Physical Exam:    VS:  BP  132/80   Pulse 65   Ht 5' 1"$  (1.549 m)   Wt 182 lb 9.6 oz (82.8 kg)   SpO2 98%   BMI 34.50 kg/m     Wt Readings from Last 3 Encounters:  07/09/22 182 lb 9.6 oz (82.8 kg)  01/05/22 186 lb (84.4 kg)  07/28/21 185 lb 3.2 oz (84 kg)     GEN:  Well nourished, well developed in no acute distress HEENT: Normal NECK: No JVD; No carotid bruits CARDIAC: RRR, no murmurs, rubs, gallops RESPIRATORY:  Clear to auscultation without rales, wheezing or rhonchi  ABDOMEN: Soft, non-tender, non-distended MUSCULOSKELETAL:  Bilateral LE edema, skin is shiny, no weeping ; No deformity. 2+ pedal pulses, equal bilaterally SKIN: Warm and dry NEUROLOGIC:  Alert and oriented x 3 PSYCHIATRIC:  Normal affect   EKG:  EKG is ordered today.  The ekg ordered today demonstrates normal sinus rhythm at 65 bpm nonspecific T wave abnormality  Diagnoses:    1. Essential hypertension   2. History of pulmonary embolism   3. Chronic anticoagulation   4. Coronary artery disease involving native coronary artery of native heart without angina pectoris   5. Chronic heart failure with preserved ejection fraction (HCC)    Assessment and Plan:     History of pulmonary embolism on lifelong anticoagulation: No bleeding problems. We will update BMP today to ensure stable renal function on Eliquis 5 mg twice daily.   CAD without angina: Hx NSTEMI 2019 with LAD aneurysm with suspected microembolization treated with [place]. She denies chest pain, dyspnea,  or other symptoms concerning for angina. No indication for further ischemic evaluation at this time. Continue rosuvastatin, amlodipine, metoprolol.   Chronic DOE/Pulmonary hypertension: Moderately elevated pulmonary artery systolic pressure 77 mmHg on echo 10/2020. Reports chronic shortness of breath is stable. Continue to monitor clinically.   Chronic HFpEF: LVEF 55 to 60%, mild LVH, G1 DD on echo 10/2020. Bilateral LE edema that she feels is stable. Otherwise, no evidence of volume overload. Continue to monitor home weight. Takes Lasix as needed, several days per week. Continue Lasix, Farxiga, spironolactone, metoprolol.   Hypertension: BP is well controlled. Describes labile BP at home, but no records to review. She is overall feeling well so we will continue current anti-hypertensive therapy.    Disposition: 6 months with Dr. Johney Frame (transfer from Dr. Tamala Julian)  Medication Adjustments/Labs and Tests Ordered: Current medicines are reviewed at length with the patient today.  Concerns regarding medicines are outlined above.  Orders Placed This Encounter  Procedures   Basic Metabolic Panel (BMET)   EKG 12-Lead   Meds ordered this encounter  Medications   apixaban (ELIQUIS) 5 MG TABS tablet    Sig: Take 1 tablet (5 mg total) by mouth 2 (two) times daily.    Dispense:  180 tablet    Refill:  3    Patient Instructions  Medication Instructions:  Your physician recommends that you continue on your current medications as directed. Please refer to the Current Medication list given to you today. *If you need a refill on your cardiac medications before your next appointment, please call your pharmacy*   Lab Work: TODAY-BMET If you have labs (blood work) drawn today and your tests are completely normal, you will receive your results only by: [place] (if you have MyChart) OR A paper copy in the mail If you have any lab test that is abnormal or we need to change your treatment, we will  call you to review the  results.   Testing/Procedures: NONE ORDERED   Follow-Up: At [facility], you and your health needs are our priority.  As part of our continuing mission to provide you with exceptional heart care, we have created designated Provider Care Teams.  These Care Teams include your primary Cardiologist (physician) and Advanced Practice Providers (APPs -  Physician Assistants and Nurse Practitioners) who all work together to provide you with the care you need, when you need it.  We recommend signing up for the patient portal called "MyChart".  Sign up information is provided on this After Visit Summary.  MyChart is used to connect with patients for Virtual Visits (Telemedicine).  Patients are able to view lab/test results, encounter notes, upcoming appointments, etc.  Non-urgent messages can be sent to your provider as well.   To learn more about what you can do with MyChart, go to [email or web address].    Your next appointment:   6 month(s)  Provider:   Freada Bergeron, MD     Other Instructions     Signed, Emmaline Life, NP  07/09/2022 12:14 PM    [place]

## 2022-07-09 ENCOUNTER — Encounter: Payer: Self-pay | Admitting: Nurse Practitioner

## 2022-07-09 ENCOUNTER — Ambulatory Visit: Payer: Medicare Other | Attending: Nurse Practitioner | Admitting: Nurse Practitioner

## 2022-07-09 VITALS — BP 132/80 | HR 65 | Ht 61.0 in | Wt 182.6 lb

## 2022-07-09 DIAGNOSIS — I5032 Chronic diastolic (congestive) heart failure: Secondary | ICD-10-CM

## 2022-07-09 DIAGNOSIS — I10 Essential (primary) hypertension: Secondary | ICD-10-CM | POA: Diagnosis not present

## 2022-07-09 DIAGNOSIS — Z7901 Long term (current) use of anticoagulants: Secondary | ICD-10-CM | POA: Diagnosis not present

## 2022-07-09 DIAGNOSIS — I2694 Multiple subsegmental pulmonary emboli without acute cor pulmonale: Secondary | ICD-10-CM | POA: Diagnosis not present

## 2022-07-09 DIAGNOSIS — I2510 Atherosclerotic heart disease of native coronary artery without angina pectoris: Secondary | ICD-10-CM

## 2022-07-09 DIAGNOSIS — Z86711 Personal history of pulmonary embolism: Secondary | ICD-10-CM

## 2022-07-09 LAB — BASIC METABOLIC PANEL WITH GFR
BUN/Creatinine Ratio: 19 (ref 12–28)
BUN: 19 mg/dL (ref 8–27)
CO2: 24 mmol/L (ref 20–29)
Calcium: 9.7 mg/dL (ref 8.7–10.3)
Chloride: 104 mmol/L (ref 96–106)
Creatinine, Ser: 0.99 mg/dL (ref 0.57–1.00)
Glucose: 90 mg/dL (ref 70–99)
Potassium: 4.5 mmol/L (ref 3.5–5.2)
Sodium: 142 mmol/L (ref 134–144)
eGFR: 55 mL/min/1.73 — ABNORMAL LOW

## 2022-07-09 MED ORDER — APIXABAN 5 MG PO TABS: 5.0000 mg | ORAL_TABLET | Freq: Two times a day (BID) | ORAL | 3 refills | Status: DC

## 2022-07-09 NOTE — Patient Instructions (Signed)
Medication Instructions:  Your physician recommends that you continue on your current medications as directed. Please refer to the Current Medication list given to you today. *If you need a refill on your cardiac medications before your next appointment, please call your pharmacy*   Lab Work: TODAY-BMET If you have labs (blood work) drawn today and your tests are completely normal, you will receive your results only by: [place] (if you have MyChart) OR A paper copy in the mail If you have any lab test that is abnormal or we need to change your treatment, we will call you to review the results.   Testing/Procedures: NONE ORDERED   Follow-Up: At [facility], you and your health needs are our priority.  As part of our continuing mission to provide you with exceptional heart care, we have created designated Provider Care Teams.  These Care Teams include your primary Cardiologist (physician) and Advanced Practice Providers (APPs -  Physician Assistants and Nurse Practitioners) who all work together to provide you with the care you need, when you need it.  We recommend signing up for the patient portal called "MyChart".  Sign up information is provided on this After Visit Summary.  MyChart is used to connect with patients for Virtual Visits (Telemedicine).  Patients are able to view lab/test results, encounter notes, upcoming appointments, etc.  Non-urgent messages can be sent to your provider as well.   To learn more about what you can do with MyChart, go to [email or web address].    Your next appointment:   6 month(s)  Provider:   Freada Bergeron, MD     Other Instructions

## 2022-07-11 ENCOUNTER — Ambulatory Visit
Admission: RE | Admit: 2022-07-11 | Discharge: 2022-07-11 | Disposition: A | Discharge status: Home / Self Care | Payer: Medicare Other | Source: Ambulatory Visit | Attending: Family Medicine | Admitting: Family Medicine

## 2022-07-11 ENCOUNTER — Other Ambulatory Visit: Payer: Self-pay

## 2022-07-11 DIAGNOSIS — Z1231 Encounter for screening mammogram for malignant neoplasm of breast: Secondary | ICD-10-CM | POA: Diagnosis not present

## 2022-07-26 DIAGNOSIS — Z86711 Personal history of pulmonary embolism: Secondary | ICD-10-CM | POA: Diagnosis not present

## 2022-07-26 DIAGNOSIS — N1831 Chronic kidney disease, stage 3a: Secondary | ICD-10-CM | POA: Diagnosis not present

## 2022-07-26 DIAGNOSIS — I2510 Atherosclerotic heart disease of native coronary artery without angina pectoris: Secondary | ICD-10-CM | POA: Diagnosis not present

## 2022-07-26 DIAGNOSIS — E039 Hypothyroidism, unspecified: Secondary | ICD-10-CM | POA: Diagnosis not present

## 2022-07-26 DIAGNOSIS — N3281 Overactive bladder: Secondary | ICD-10-CM | POA: Diagnosis not present

## 2022-07-26 DIAGNOSIS — I5032 Chronic diastolic (congestive) heart failure: Secondary | ICD-10-CM | POA: Diagnosis not present

## 2022-08-10 ENCOUNTER — Other Ambulatory Visit: Payer: Self-pay

## 2022-08-10 MED ORDER — DAPAGLIFLOZIN PROPANEDIOL 10 MG PO TABS: 10.0000 mg | ORAL_TABLET | Freq: Every day | ORAL | 3 refills | Status: DC

## 2022-08-27 ENCOUNTER — Telehealth: Payer: Self-pay | Admitting: Cardiology

## 2022-08-27 NOTE — Telephone Encounter (Signed)
Spoke to patient daughter Darryll Capers ( [place]), patient stated for the week she is experiencing nervousness , shakiness, and unstable HR. Pt usually drinks caffeine free  drinks, last week she started drinking caffeinated coffee and symptoms have worsen. She is not longer drinking caffeinated coffee. Pt is prescribed Telmisartan, she did take on today this did not help her symptoms.  Advised patient to contact PCP pertaining to her symptoms. Will forward to MD and nurse.         Blood pressure readings  4/1 1505 134/76, HR 82 3/31 1330 139/79 HR 59 3/30 1300 148/85 HR 83 3/29 1230 128/83 HR 60 3/28 1622 131/78 HR 82

## 2022-08-27 NOTE — Telephone Encounter (Signed)
Spoke to the patient , explained Dr Johney Frame recommendation:  Agree with you. Likely related to the caffeine. Blood pressures and HR look good.   Patient voiced understanding

## 2022-08-27 NOTE — Telephone Encounter (Signed)
STAT if HR is under 50 or over 120 (normal HR is 60-100 beats per minute)  What is your heart rate? 85,79  Do you have a log of your heart rate readings (document readings)?    Do you have any other symptoms? shakiness and nervousness    Daughter calling in to see if she needs to take the patient to the hospital.

## 2022-09-04 ENCOUNTER — Other Ambulatory Visit: Payer: Self-pay | Admitting: *Deleted

## 2022-09-04 MED ORDER — METOPROLOL TARTRATE 25 MG PO TABS: 12.5000 mg | ORAL_TABLET | Freq: Two times a day (BID) | ORAL | 3 refills | Status: DC

## 2022-09-07 DIAGNOSIS — R350 Frequency of micturition: Secondary | ICD-10-CM | POA: Diagnosis not present

## 2022-09-23 ENCOUNTER — Other Ambulatory Visit: Payer: Self-pay | Admitting: Nurse Practitioner

## 2022-10-03 ENCOUNTER — Ambulatory Visit: Payer: Medicare Other | Admitting: Podiatry

## 2022-10-26 DIAGNOSIS — R350 Frequency of micturition: Secondary | ICD-10-CM | POA: Diagnosis not present

## 2022-10-29 DIAGNOSIS — M810 Age-related osteoporosis without current pathological fracture: Secondary | ICD-10-CM | POA: Diagnosis not present

## 2022-12-05 DIAGNOSIS — R350 Frequency of micturition: Secondary | ICD-10-CM | POA: Diagnosis not present

## 2022-12-11 ENCOUNTER — Ambulatory Visit: Payer: Medicare Other | Admitting: Podiatry

## 2022-12-11 ENCOUNTER — Encounter: Payer: Self-pay | Admitting: Podiatry

## 2022-12-11 VITALS — BP 141/44

## 2022-12-11 DIAGNOSIS — L84 Corns and callosities: Secondary | ICD-10-CM

## 2022-12-11 DIAGNOSIS — B351 Tinea unguium: Secondary | ICD-10-CM

## 2022-12-11 DIAGNOSIS — M79675 Pain in left toe(s): Secondary | ICD-10-CM

## 2022-12-11 DIAGNOSIS — M79674 Pain in right toe(s): Secondary | ICD-10-CM

## 2022-12-11 DIAGNOSIS — I739 Peripheral vascular disease, unspecified: Secondary | ICD-10-CM | POA: Diagnosis not present

## 2022-12-17 NOTE — Progress Notes (Signed)
  Subjective:  Patient ID: Lynn Brown, female    DOB: 1931/10/10,  MRN: [id number]  Lynn Brown presents to clinic today for at risk foot care. Patient has h/o PAD and callus(es) left foot and painful thick toenails that are difficult to trim. Painful toenails interfere with ambulation. Aggravating factors include wearing enclosed shoe gear. Pain is relieved with periodic professional debridement. Painful calluses are aggravated when weightbearing with and without shoegear. Pain is relieved with periodic professional debridement.  Chief Complaint  Patient presents with   Nail Problem    Rfc    New problem(s): None.   PCP is Meriam Sprague, MD.  No Known Allergies  Review of Systems: Negative except as noted in the HPI.  Objective: No changes noted in today's physical examination. Vitals:   12/11/22 1620  BP: (!) 141/44   Lynn Brown is a pleasant 89 y.o. female in NAD. AAO x 3.  Vascular Examination: CFT <3 seconds b/l. DP pulses faintly palpable b/l. PT pulses nonpalpable b/l. Digital hair absent. Skin temperature gradient warm to warm b/l. No pain with calf compression. No ischemia or gangrene. No cyanosis or clubbing noted b/l. CFT <3 seconds b/l LE. Faintly palpable DP pulses b/l LE. Diminished PT pulse(s) b/l LE. No pain with calf compression b/l. No edema noted b/l LE. No cyanosis or clubbing noted b/l LE.   Neurological Examination: Sensation grossly intact b/l with 10 gram monofilament. Vibratory sensation intact b/l.   Dermatological Examination: No open wounds b/l. No interdigital macerations. Toenails 1-5 b/l thick, discolored, elongated with subungual debris and pain on dorsal palpation.  Pedal skin thin, shiny and atrophic b/l LE. Hyperkeratotic lesion(s) left great toe.  No erythema, no edema, no drainage, no fluctuance.  Musculoskeletal Examination: HAV with bunion deformity noted b/l LE. Pes planus deformity noted bilateral LE.  Radiographs:  None  Assessment/Plan: 1. Pain due to onychomycosis of toenails of both feet   2. Callus   3. PVD (peripheral vascular disease) (HCC)     -Consent given for treatment as described below: -Examined patient. -Continue supportive shoe gear daily. -Toenails 1-5 b/l were debrided in length and girth with sterile nail nippers and dremel without iatrogenic bleeding.  -Callus(es) left great toe pared utilizing sterile scalpel blade without complication or incident. Total number debrided =1. -Patient/POA to call should there be question/concern in the interim.   Return in about 3 months (around 03/13/2023).  Freddie Breech, DPM

## 2022-12-20 ENCOUNTER — Telehealth: Payer: Self-pay | Admitting: Cardiology

## 2022-12-20 MED ORDER — AMLODIPINE BESYLATE 2.5 MG PO TABS: 2.5000 mg | ORAL_TABLET | Freq: Every day | ORAL | 2 refills | Status: DC

## 2022-12-20 NOTE — Telephone Encounter (Signed)
Pt's medication was sent to pt's pharmacy as requested. Confirmation received.  °

## 2022-12-20 NOTE — Telephone Encounter (Signed)
*  STAT* If patient is at the pharmacy, call can be transferred to refill team.   1. Which medications need to be refilled? (please list name of each medication and dose if known)  amLODipine (NORVASC) 2.5 MG tablet  2. Which pharmacy/location (including street and city if local pharmacy) is medication to be sent to? [facility]) - [place], [place] - [address] [place]  3. Do they need a 30 day or 90 day supply?  90 day supply

## 2022-12-24 ENCOUNTER — Other Ambulatory Visit: Payer: Self-pay | Admitting: *Deleted

## 2022-12-24 MED ORDER — AMLODIPINE BESYLATE 2.5 MG PO TABS: 2.5000 mg | ORAL_TABLET | Freq: Every day | ORAL | 2 refills | Status: DC

## 2023-01-14 ENCOUNTER — Ambulatory Visit: Payer: Medicare Other | Admitting: Cardiology

## 2023-01-24 DIAGNOSIS — R7303 Prediabetes: Secondary | ICD-10-CM | POA: Diagnosis not present

## 2023-01-24 DIAGNOSIS — I130 Hypertensive heart and chronic kidney disease with heart failure and stage 1 through stage 4 chronic kidney disease, or unspecified chronic kidney disease: Secondary | ICD-10-CM | POA: Diagnosis not present

## 2023-01-24 DIAGNOSIS — N1831 Chronic kidney disease, stage 3a: Secondary | ICD-10-CM | POA: Diagnosis not present

## 2023-01-24 DIAGNOSIS — E039 Hypothyroidism, unspecified: Secondary | ICD-10-CM | POA: Diagnosis not present

## 2023-01-24 DIAGNOSIS — I129 Hypertensive chronic kidney disease with stage 1 through stage 4 chronic kidney disease, or unspecified chronic kidney disease: Secondary | ICD-10-CM | POA: Diagnosis not present

## 2023-01-24 DIAGNOSIS — I471 Supraventricular tachycardia, unspecified: Secondary | ICD-10-CM | POA: Diagnosis not present

## 2023-01-24 DIAGNOSIS — E785 Hyperlipidemia, unspecified: Secondary | ICD-10-CM | POA: Diagnosis not present

## 2023-01-24 DIAGNOSIS — Z86711 Personal history of pulmonary embolism: Secondary | ICD-10-CM | POA: Diagnosis not present

## 2023-01-24 DIAGNOSIS — Z0000 Encounter for general adult medical examination without abnormal findings: Secondary | ICD-10-CM | POA: Diagnosis not present

## 2023-01-24 DIAGNOSIS — I5032 Chronic diastolic (congestive) heart failure: Secondary | ICD-10-CM | POA: Diagnosis not present

## 2023-01-24 DIAGNOSIS — I2510 Atherosclerotic heart disease of native coronary artery without angina pectoris: Secondary | ICD-10-CM | POA: Diagnosis not present

## 2023-01-24 DIAGNOSIS — E559 Vitamin D deficiency, unspecified: Secondary | ICD-10-CM | POA: Diagnosis not present

## 2023-01-24 DIAGNOSIS — I2541 Coronary artery aneurysm: Secondary | ICD-10-CM | POA: Diagnosis not present

## 2023-01-24 DIAGNOSIS — M810 Age-related osteoporosis without current pathological fracture: Secondary | ICD-10-CM | POA: Diagnosis not present

## 2023-02-25 DIAGNOSIS — D72819 Decreased white blood cell count, unspecified: Secondary | ICD-10-CM | POA: Diagnosis not present

## 2023-03-18 ENCOUNTER — Encounter: Payer: Self-pay | Admitting: Cardiovascular Disease

## 2023-03-18 NOTE — Progress Notes (Unsigned)
Cardiology Office Note:  .   Date:  03/19/2023  ID:  Lynn Brown, DOB 01-15-1932, MRN [id number] PCP: Meriam Sprague, MD (Inactive)  [facility] Providers Cardiologist:  Meriam Sprague, MD (Inactive)  [name], now [name]  Click to update primary MD,subspecialty MD or APP then REFRESH:1}   History of Present Illness: .   Lynn Brown is a 89 y.o. female with hx of HTN, nephrectomy as a kidney doner, HLD , PAD, s/p SFA and popliteal stenting in 04/2020 Seen with her daughter, Lynn Brown   Chronic leg edema Cad - LAD aneurism, extasia of RCA  S/p STEMI thought to be microembolism  Eliquis was started for her coronary microembolism but then discontinued  Eventually developed bilateral submassive pulmonary embolus and was stared back on Eliquis   Has labile BP   No cp Occasional dyspnea   Does a little housework , not much     ROS:   Studies Reviewed: Marland Kitchen   EKG Interpretation Date/Time:  Tuesday March 19 2023 15:37:27 EDT Ventricular Rate:  66 PR Interval:  188 QRS Duration:  74 QT Interval:  404 QTC Calculation: 423 R Axis:   73  Text Interpretation: Normal sinus rhythm Nonspecific ST abnormality When compared with ECG of 09-Aug-2020 10:31, No significant change was found Confirmed by Kristeen Miss ([id number]) on 03/19/2023 4:47:06 PM     Risk Assessment/Calculations:             Physical Exam:   VS:  BP 124/62   Pulse 66   Ht 5\' 1"  (1.549 m)   Wt 177 lb 6.4 oz (80.5 kg)   SpO2 93%   BMI 33.52 kg/m    Wt Readings from Last 3 Encounters:  03/19/23 177 lb 6.4 oz (80.5 kg)  07/09/22 182 lb 9.6 oz (82.8 kg)  01/05/22 186 lb (84.4 kg)    GEN: eldery female ,  in no acute distress NECK: No JVD; No carotid bruits CARDIAC: RRR, no murmurs, rubs, gallops RESPIRATORY:  Clear to auscultation without rales, wheezing or rhonchi  ABDOMEN: Soft, non-tender, non-distended EXTREMITIES:  No edema; No deformity   ASSESSMENT AND PLAN: .     Leg  edema: She has significant leg edema.  I suspect this is because of her chronic venous insufficiency due to her previous DVT.  She is also fairly immobile.  We discussed the various conservative ways to reduce the leg edema.  I given her recommendations on how to get the lounge doctor leg rest.  She is already on Lasix.  We discussed compression hose. We discussed referring her to [facility] for further evaluation.  She has been seen at [facility] for her chronic venous disease in the past but does not want to start going to another clinic at this time.   2.  History of DVT/PE continue Eliquis.       Dispo: 6 months with me    Signed, Kristeen Miss, MD

## 2023-03-19 ENCOUNTER — Ambulatory Visit: Payer: Medicare Other | Attending: Cardiology | Admitting: Cardiovascular Disease

## 2023-03-19 ENCOUNTER — Ambulatory Visit: Payer: Medicare Other | Admitting: Cardiovascular Disease

## 2023-03-19 ENCOUNTER — Encounter: Payer: Self-pay | Admitting: Cardiovascular Disease

## 2023-03-19 VITALS — BP 124/62 | HR 66 | Ht 61.0 in | Wt 177.4 lb

## 2023-03-19 DIAGNOSIS — I10 Essential (primary) hypertension: Secondary | ICD-10-CM

## 2023-03-19 DIAGNOSIS — R600 Localized edema: Secondary | ICD-10-CM | POA: Diagnosis not present

## 2023-03-19 DIAGNOSIS — I5032 Chronic diastolic (congestive) heart failure: Secondary | ICD-10-CM | POA: Diagnosis not present

## 2023-03-19 DIAGNOSIS — Z09 Encounter for follow-up examination after completed treatment for conditions other than malignant neoplasm: Secondary | ICD-10-CM | POA: Diagnosis not present

## 2023-03-19 NOTE — Patient Instructions (Addendum)
Follow-Up: At [facility], you and your health needs are our priority.  As part of our continuing mission to provide you with exceptional heart care, we have created designated Provider Care Teams.  These Care Teams include your primary Cardiologist (physician) and Advanced Practice Providers (APPs -  Physician Assistants and Nurse Practitioners) who all work together to provide you with the care you need, when you need it.  Your next appointment:   6 month(s)  Provider:   Kristeen Miss, MD   For your  leg edema you  should do  the following 1. Leg elevation - I recommend the Lounge Dr. Leg rest.  See below for details  2. Salt restriction  -  Use potassium chloride instead of regular salt as a salt substitute. 3. Walk regularly 4. Compression hose - Medical Supply store  5. Weight loss    Available on [email or web address] Or  Go to [email or web address]

## 2023-03-20 ENCOUNTER — Encounter: Payer: Self-pay | Admitting: Podiatry

## 2023-03-20 ENCOUNTER — Ambulatory Visit: Payer: Medicare Other | Admitting: Podiatry

## 2023-03-20 DIAGNOSIS — B351 Tinea unguium: Secondary | ICD-10-CM | POA: Diagnosis not present

## 2023-03-20 DIAGNOSIS — L84 Corns and callosities: Secondary | ICD-10-CM

## 2023-03-20 DIAGNOSIS — M79674 Pain in right toe(s): Secondary | ICD-10-CM

## 2023-03-20 DIAGNOSIS — M79675 Pain in left toe(s): Secondary | ICD-10-CM | POA: Diagnosis not present

## 2023-03-20 DIAGNOSIS — I739 Peripheral vascular disease, unspecified: Secondary | ICD-10-CM | POA: Diagnosis not present

## 2023-03-26 NOTE — Progress Notes (Signed)
Subjective:  Patient ID: Lynn Brown, female    DOB: 1931-06-06,  MRN: [id number]  89 y.o. female presents with at risk foot care. Patient has h/o PAD and callus(es) L hallux and painful thick toenails that are difficult to trim. Painful toenails interfere with ambulation. Aggravating factors include wearing enclosed shoe gear. Pain is relieved with periodic professional debridement. Painful calluses are aggravated when weightbearing with and without shoegear. Pain is relieved with periodic professional debridement. Chief Complaint  Patient presents with   RFC    RFC -patient has no issues or concerns     PCP: Meriam Sprague, MD (Inactive).  New problem(s): None.   Review of Systems: Negative except as noted in the HPI.   No Known Allergies  Objective:  There were no vitals filed for this visit. Constitutional Patient is a pleasant 89 y.o. African American female WD, WN in NAD. AAO x 3.  Vascular Capillary fill time to digits <3 seconds.  DP pulse(s) are faintly palpable b/l lower extremities. Nonpalpable PT pulses b/l.  Pedal hair absent b/l. Lower extremity skin temperature gradient warm to cool b/l. No pain with calf compression b/l. No cyanosis or clubbing noted. No ischemia nor gangrene noted b/l.   Neurologic Protective sensation intact 5/5 intact bilaterally with 10g monofilament b/l. Vibratory sensation intact b/l. No clonus b/l.   Dermatologic Pedal skin is thin, shiny and atrophic b/l.  No open wounds b/l lower extremities. No interdigital macerations b/l lower extremities. Toenails 1-5 b/l elongated, discolored, dystrophic, thickened, crumbly with subungual debris and tenderness to dorsal palpation. Hyperkeratotic lesion(s) L hallux.  No erythema, no edema, no drainage, no fluctuance.  Orthopedic: Normal muscle strength 5/5 to all lower extremity muscle groups bilaterally. HAV with bunion deformity noted b/l LE. Pes planus deformity noted bilateral LE.   Last HgA1c:       No data to display           Assessment:   1. Pain due to onychomycosis of toenails of both feet   2. Callus   3. PVD (peripheral vascular disease) (HCC)    Plan:  Consent given for treatment as described below: -Patient was evaluated today. All questions/concerns addressed on today's visit. -Patient to continue soft, supportive shoe gear daily. -Mycotic toenails 1-5 bilaterally were debrided in length and girth with sterile nail nippers and dremel without incident. -Callus(es) L hallux pared utilizing sterile scalpel blade without complication or incident. Total number debrided =1. -Patient/POA to call should there be question/concern in the interim.  Return in about 3 months (around 06/20/2023).  Freddie Breech, DPM

## 2023-04-06 ENCOUNTER — Emergency Department (HOSPITAL_BASED_OUTPATIENT_CLINIC_OR_DEPARTMENT_OTHER)
Admission: EM | Admit: 2023-04-06 | Discharge: 2023-04-06 | Disposition: A | Discharge status: Home / Self Care | Payer: Medicare Other | Attending: Emergency Medicine | Admitting: Emergency Medicine

## 2023-04-06 ENCOUNTER — Encounter (HOSPITAL_BASED_OUTPATIENT_CLINIC_OR_DEPARTMENT_OTHER): Payer: Self-pay

## 2023-04-06 ENCOUNTER — Emergency Department (HOSPITAL_BASED_OUTPATIENT_CLINIC_OR_DEPARTMENT_OTHER): Payer: Medicare Other

## 2023-04-06 DIAGNOSIS — Z79899 Other long term (current) drug therapy: Secondary | ICD-10-CM | POA: Diagnosis not present

## 2023-04-06 DIAGNOSIS — J069 Acute upper respiratory infection, unspecified: Secondary | ICD-10-CM | POA: Insufficient documentation

## 2023-04-06 DIAGNOSIS — Z20822 Contact with and (suspected) exposure to covid-19: Secondary | ICD-10-CM | POA: Insufficient documentation

## 2023-04-06 DIAGNOSIS — Z7901 Long term (current) use of anticoagulants: Secondary | ICD-10-CM | POA: Insufficient documentation

## 2023-04-06 DIAGNOSIS — R918 Other nonspecific abnormal finding of lung field: Secondary | ICD-10-CM | POA: Diagnosis not present

## 2023-04-06 DIAGNOSIS — E039 Hypothyroidism, unspecified: Secondary | ICD-10-CM | POA: Diagnosis not present

## 2023-04-06 DIAGNOSIS — R059 Cough, unspecified: Secondary | ICD-10-CM | POA: Diagnosis not present

## 2023-04-06 DIAGNOSIS — I771 Stricture of artery: Secondary | ICD-10-CM | POA: Diagnosis not present

## 2023-04-06 DIAGNOSIS — I10 Essential (primary) hypertension: Secondary | ICD-10-CM | POA: Insufficient documentation

## 2023-04-06 DIAGNOSIS — B9789 Other viral agents as the cause of diseases classified elsewhere: Secondary | ICD-10-CM | POA: Diagnosis not present

## 2023-04-06 DIAGNOSIS — R051 Acute cough: Secondary | ICD-10-CM | POA: Diagnosis not present

## 2023-04-06 DIAGNOSIS — I700 Atherosclerosis of aorta: Secondary | ICD-10-CM | POA: Diagnosis not present

## 2023-04-06 DIAGNOSIS — R0602 Shortness of breath: Secondary | ICD-10-CM | POA: Diagnosis not present

## 2023-04-06 DIAGNOSIS — I517 Cardiomegaly: Secondary | ICD-10-CM | POA: Diagnosis not present

## 2023-04-06 LAB — CBC WITH DIFFERENTIAL/PLATELET
Abs Immature Granulocytes: 0.01 10*3/uL (ref 0.00–0.07)
Basophils Absolute: 0 10*3/uL (ref 0.0–0.1)
Basophils Relative: 0 %
Eosinophils Absolute: 0.1 10*3/uL (ref 0.0–0.5)
Eosinophils Relative: 3 %
HCT: 43 % (ref 36.0–46.0)
Hemoglobin: 14 g/dL (ref 12.0–15.0)
Immature Granulocytes: 0 %
Lymphocytes Relative: 36 %
Lymphs Abs: 1.5 10*3/uL (ref 0.7–4.0)
MCH: 30.8 pg (ref 26.0–34.0)
MCHC: 32.6 g/dL (ref 30.0–36.0)
MCV: 94.5 fL (ref 80.0–100.0)
Monocytes Absolute: 0.5 10*3/uL (ref 0.1–1.0)
Monocytes Relative: 12 %
Neutro Abs: 2.1 10*3/uL (ref 1.7–7.7)
Neutrophils Relative %: 49 %
Platelets: 177 10*3/uL (ref 150–400)
RBC: 4.55 MIL/uL (ref 3.87–5.11)
RDW: 14.1 % (ref 11.5–15.5)
WBC: 4.2 10*3/uL (ref 4.0–10.5)
nRBC: 0 % (ref 0.0–0.2)

## 2023-04-06 LAB — COMPREHENSIVE METABOLIC PANEL
ALT: 14 U/L (ref 0–44)
AST: 17 U/L (ref 15–41)
Albumin: 4.2 g/dL (ref 3.5–5.0)
Alkaline Phosphatase: 54 U/L (ref 38–126)
Anion gap: 5 (ref 5–15)
BUN: 18 mg/dL (ref 8–23)
CO2: 28 mmol/L (ref 22–32)
Calcium: 10 mg/dL (ref 8.9–10.3)
Chloride: 106 mmol/L (ref 98–111)
Creatinine, Ser: 0.91 mg/dL (ref 0.44–1.00)
GFR, Estimated: >60 mL/min (ref >60)
Glucose, Bld: 79 mg/dL (ref 70–99)
Potassium: 4.9 mmol/L (ref 3.5–5.1)
Sodium: 139 mmol/L (ref 135–145)
Total Bilirubin: 0.7 mg/dL (ref <1.2)
Total Protein: 7.7 g/dL (ref 6.5–8.1)

## 2023-04-06 LAB — RESP PANEL BY RT-PCR (RSV, FLU A&B, COVID)  RVPGX2
Influenza A by PCR: NEGATIVE
Influenza B by PCR: NEGATIVE
Resp Syncytial Virus by PCR: NEGATIVE
SARS Coronavirus 2 by RT PCR: NEGATIVE

## 2023-04-06 MED ORDER — BENZONATATE 100 MG PO CAPS
100.0000 mg | ORAL_CAPSULE | Freq: Once | ORAL | Status: AC
  Administered 2023-04-06: 100 mg via ORAL
  Filled 2023-04-06: qty 1

## 2023-04-06 MED ORDER — BENZONATATE 100 MG PO CAPS: 100.0000 mg | ORAL_CAPSULE | Freq: Three times a day (TID) | ORAL | 0 refills | Status: DC

## 2023-04-06 NOTE — Discharge Instructions (Signed)
Take Tessalon as directed. You can take Mucinex to help with chest congestion as well, which is an over-the-counter medication. You can use this safely in the setting of high blood pressure for 3-4 days. If not improving by that time, see your doctor for recheck in the office.

## 2023-04-06 NOTE — ED Provider Notes (Signed)
[place] EMERGENCY DEPARTMENT AT [facility] Provider Note   CSN: [id number] Arrival date & time: 04/06/23  1510     History  Chief Complaint  Patient presents with   Shortness of Breath    Lynn Brown is a 89 y.o. female.  Patient with history of HTN, HLD, hypothyroid, anxiety, osteoporosis, PE on Eliquis, presents from [facility] for evaluation of productive cough over the last 2-3 days. No known fever. No nausea, vomiting. The cough makes her feel short of breath. No chest pain. She also reports nasal congestion. No abnormal LE edema. She reports the clinic sent her here because her oxygen level was low.   The history is provided by the patient and a relative. No language interpreter was used.  Shortness of Breath      Home Medications Prior to Admission medications   Medication Sig Start Date End Date Taking? Authorizing Provider  benzonatate (TESSALON) 100 MG capsule Take 1 capsule (100 mg total) by mouth every 8 (eight) hours. 04/06/23  Yes Elpidio Anis, PA-C  Acetaminophen 500 MG capsule Take 500 mg by mouth every 6 (six) hours as needed for pain.    [provider]  alendronate (FOSAMAX) 70 MG tablet Take by mouth. 01/04/16   [provider]  ALPRAZolam Prudy Feeler) 0.5 MG tablet Take by mouth.    [provider]  amLODipine (NORVASC) 2.5 MG tablet Take 1 tablet (2.5 mg total) by mouth daily. 12/24/22   Swinyer, Zachary George, NP  apixaban (ELIQUIS) 5 MG TABS tablet Take 1 tablet (5 mg total) by mouth 2 (two) times daily. 07/09/22   Swinyer, Zachary George, NP  ascorbic acid (VITAMIN C) 500 MG tablet Take 500 mg by mouth in the morning and at bedtime.    [provider]  Calcium Citrate-Vitamin D 315-250 MG-UNIT TABS Take 1 tablet by mouth daily.    [provider]  cetirizine (ZYRTEC) 10 MG tablet Take 10 mg by mouth daily as needed (allergies.). 09/07/19   [provider]  Cholecalciferol 50 MCG (2000 UT) TABS Take  by mouth.    [provider]  clorazepate (TRANXENE) 3.75 MG tablet     [provider]  Coenzyme Q10 (COQ10 PO) Take 1 capsule by mouth daily at 2 PM.    [provider]  dapagliflozin propanediol (FARXIGA) 10 MG TABS tablet Take 1 tablet (10 mg total) by mouth daily before breakfast. 08/10/22   Swinyer, Zachary George, NP  denosumab (PROLIA) 60 MG/ML SOSY injection 60 mg    [provider]  furosemide (LASIX) 20 MG tablet TAKE 1 TABLET BY MOUTH AS NEEDED FOR FLUID OR EDEMA 01/09/21   Lyn Records, MD  furosemide (LASIX) 40 MG tablet     [provider]  levothyroxine (SYNTHROID) 112 MCG tablet Take 112 mcg by mouth every morning. 06/30/20   [provider]  magnesium oxide (MAG-OX) 400 MG tablet Take 400 mg by mouth every evening.     [provider]  methocarbamol (ROBAXIN) 500 MG tablet Take 1 tablet (500 mg total) by mouth every 6 (six) hours as needed for muscle spasms. 01/05/21   Maczis, Elmer Sow, PA-C  metoprolol tartrate (LOPRESSOR) 25 MG tablet Take 0.5 tablets (12.5 mg total) by mouth 2 (two) times daily. 09/04/22   Meriam Sprague, MD  nitroGLYCERIN (NITROSTAT) 0.4 MG SL tablet Place 1 tablet (0.4 mg total) under the tongue every 5 (five) minutes as needed for chest pain. 07/28/21  Lyn Records, MD  potassium chloride (KLOR-CON) 10 MEQ tablet     [provider]  rosuvastatin (CRESTOR) 20 MG tablet Take 20 mg by mouth every evening.     [provider]  spironolactone (ALDACTONE) 25 MG tablet TAKE 1 TABLET BY MOUTH DAILY 09/24/22   Swinyer, Zachary George, NP  traMADol (ULTRAM) 50 MG tablet Take 50 mg by mouth 3 (three) times daily as needed. 12/16/21   [provider]  trospium (SANCTURA) 20 MG tablet Take 20 mg by mouth 2 (two) times daily.    [provider]  VITAMIN D PO Take 1 tablet by mouth daily at 2 PM.    [provider]      Allergies    Patient has no known allergies.     Review of Systems   Review of Systems  Respiratory:  Positive for shortness of breath.     Physical Exam Updated Vital Signs BP (!) 147/87   Pulse 71   Temp (!) 96.9 F (36.1 C)   Resp 14   SpO2 96%  Physical Exam Vitals and nursing note reviewed.  Constitutional:      General: She is not in acute distress.    Appearance: She is well-developed.  HENT:     Head: Normocephalic.  Cardiovascular:     Rate and Rhythm: Normal rate and regular rhythm.     Heart sounds: No murmur heard. Pulmonary:     Effort: Pulmonary effort is normal.     Breath sounds: Rales (Scattered bibasilar rales) present. No wheezing.  Abdominal:     Palpations: Abdomen is soft.     Tenderness: There is no abdominal tenderness.  Musculoskeletal:        General: Normal range of motion.     Cervical back: Normal range of motion.  Skin:    General: Skin is warm and dry.  Neurological:     Mental Status: She is alert.     ED Results / Procedures / Treatments   Labs (all labs ordered are listed, but only abnormal results are displayed) Labs Reviewed  RESP PANEL BY RT-PCR (RSV, FLU A&B, COVID)  [id number]  CBC WITH DIFFERENTIAL/PLATELET  COMPREHENSIVE METABOLIC PANEL   Results for orders placed or performed during the hospital encounter of 04/06/23  Resp panel by RT-PCR (RSV, Flu A&B, Covid) Anterior Nasal Swab   Specimen: Anterior Nasal Swab  Result Value Ref Range   SARS Coronavirus 2 by RT PCR NEGATIVE NEGATIVE   Influenza A by PCR NEGATIVE NEGATIVE   Influenza B by PCR NEGATIVE NEGATIVE   Resp Syncytial Virus by PCR NEGATIVE NEGATIVE  CBC with Differential  Result Value Ref Range   WBC 4.2 4.0 - 10.5 K/uL   RBC 4.55 3.87 - 5.11 MIL/uL   Hemoglobin 14.0 12.0 - 15.0 g/dL   HCT 32.4 40.1 - 02.7 %   MCV 94.5 80.0 - 100.0 fL   MCH 30.8 26.0 - 34.0 pg   MCHC 32.6 30.0 - 36.0 g/dL   RDW 25.3 66.4 - 40.3 %   Platelets 177 150 - 400 K/uL   nRBC 0.0 0.0 - 0.2 %   Neutrophils Relative % 49 %    Neutro Abs 2.1 1.7 - 7.7 K/uL   Lymphocytes Relative 36 %   Lymphs Abs 1.5 0.7 - 4.0 K/uL   Monocytes Relative 12 %   Monocytes Absolute 0.5 0.1 - 1.0 K/uL   Eosinophils Relative 3 %   Eosinophils Absolute 0.1 0.0 -  0.5 K/uL   Basophils Relative 0 %   Basophils Absolute 0.0 0.0 - 0.1 K/uL   Immature Granulocytes 0 %   Abs Immature Granulocytes 0.01 0.00 - 0.07 K/uL  Comprehensive metabolic panel  Result Value Ref Range   Sodium 139 135 - 145 mmol/L   Potassium 4.9 3.5 - 5.1 mmol/L   Chloride 106 98 - 111 mmol/L   CO2 28 22 - 32 mmol/L   Glucose, Bld 79 70 - 99 mg/dL   BUN 18 8 - 23 mg/dL   Creatinine, Ser 6.38 0.44 - 1.00 mg/dL   Calcium 75.6 8.9 - 43.3 mg/dL   Total Protein 7.7 6.5 - 8.1 g/dL   Albumin 4.2 3.5 - 5.0 g/dL   AST 17 15 - 41 U/L   ALT 14 0 - 44 U/L   Alkaline Phosphatase 54 38 - 126 U/L   Total Bilirubin 0.7 <1.2 mg/dL   GFR, Estimated >29 >51 mL/min   Anion gap 5 5 - 15    EKG EKG Interpretation Date/Time:  Saturday April 06 2023 15:25:42 EST Ventricular Rate:  70 PR Interval:  190 QRS Duration:  78 QT Interval:  402 QTC Calculation: 434 R Axis:   -8  Text Interpretation: Normal sinus rhythm Normal ECG When compared with ECG of 19-Mar-2023 15:37, Questionable change in QRS axis Confirmed by Alvester Chou [phone]) on 04/06/2023 3:36:03 PM  Radiology DG Chest Port 1 View  Result Date: 04/06/2023 CLINICAL DATA:  Cough. EXAM: PORTABLE CHEST 1 VIEW COMPARISON:  Chest x-ray 04/06/2023 FINDINGS: There are atherosclerotic calcifications of the aorta. The aorta is tortuous, unchanged. The heart is mildly enlarged. There is no focal lung infiltrate, pleural effusion or pneumothorax. No acute osseous abnormality. IMPRESSION: 1. No active disease. 2. Mild cardiomegaly. Electronically Signed   By: Darliss Cheney M.D.   On: 04/06/2023 17:31    Procedures Procedures    Medications Ordered in ED Medications  benzonatate (TESSALON) capsule 100 mg (has no  administration in time range)    ED Course/ Medical Decision Making/ A&P                                 Medical Decision Making This patient presents to the ED for concern of productive cough, this involves an extensive number of treatment options, and is a complaint that carries with it a high risk of complications and morbidity.  The differential diagnosis includes pneumonia, viral URI, pulmonary edema   Co morbidities that complicate the patient evaluation  HTN, HLD, h/o PE on Eliquis   Additional history obtained:  Additional history obtained from family member at bedside External records from outside source obtained and reviewed including Records from [facility] (Dr. Cliffton Asters) appointment earlier today.    Lab Tests:  I Ordered, and personally interpreted labs.  The pertinent results include:  CBC - no leukocytosis. Cmet - no electrolyte abnormalities. Viral panel COVID/flu negative    Imaging Studies ordered:  I ordered imaging studies including CXR  I independently visualized and interpreted imaging which showed no infiltrates or consolidations.  I agree with the radiologist interpretation   Cardiac Monitoring: / EKG:  The patient was maintained on a cardiac monitor.  I personally viewed and interpreted the cardiac monitored which showed an underlying rhythm of: NSR   Problem List / ED Course / Critical interventions / Medication management  Productive cough without fever: CXR neg for PNA, edema; no WBC, no  fever - suspect viral etiology requiring symptomatic treatment.    Social Determinants of Health:  Advanced age, good family circle of support   Test / Admission - Considered:  Admission considered given age (58yrs) however, no evidence PNA, COVID, flu. No hypoxia in ED, with O2 sats from 95-97% consistently. Family asking for symptomatic relief. Will provide Tessalon Perles and recommend Mucinex. Close PCP follow up.     Amount and/or Complexity of Data  Reviewed Labs: ordered. Radiology: ordered.           Final Clinical Impression(s) / ED Diagnoses Final diagnoses:  Viral upper respiratory tract infection    Rx / DC Orders ED Discharge Orders          Ordered    benzonatate (TESSALON) 100 MG capsule  Every 8 hours        04/06/23 1754              Elpidio Anis, PA-C 04/06/23 1756    Terald Sleeper, MD 04/06/23 [phone]

## 2023-04-06 NOTE — ED Notes (Addendum)
Reviewed discharge instructions, medications, and home care with pt and family. Pt verbalized understanding and had no further questions. Pt exited ED without complications.

## 2023-04-06 NOTE — ED Notes (Signed)
IV attempted x2, unsuccessful.

## 2023-04-06 NOTE — ED Triage Notes (Signed)
She c/o shortness of breath x 2-3 days. She saw her [facility] pcp today, who found her SPO2 to be 91%. They recommended she come here to be checked. She is ambulatory with one assist, and is in no distress.

## 2023-04-07 DIAGNOSIS — J069 Acute upper respiratory infection, unspecified: Secondary | ICD-10-CM | POA: Diagnosis not present

## 2023-04-16 DIAGNOSIS — I129 Hypertensive chronic kidney disease with stage 1 through stage 4 chronic kidney disease, or unspecified chronic kidney disease: Secondary | ICD-10-CM | POA: Diagnosis not present

## 2023-04-16 DIAGNOSIS — E559 Vitamin D deficiency, unspecified: Secondary | ICD-10-CM | POA: Diagnosis not present

## 2023-04-16 DIAGNOSIS — N1831 Chronic kidney disease, stage 3a: Secondary | ICD-10-CM | POA: Diagnosis not present

## 2023-04-17 ENCOUNTER — Other Ambulatory Visit: Payer: Self-pay | Admitting: Nephrology

## 2023-04-17 DIAGNOSIS — E559 Vitamin D deficiency, unspecified: Secondary | ICD-10-CM

## 2023-04-17 DIAGNOSIS — N1831 Chronic kidney disease, stage 3a: Secondary | ICD-10-CM

## 2023-04-19 ENCOUNTER — Ambulatory Visit
Admission: RE | Admit: 2023-04-19 | Discharge: 2023-04-19 | Disposition: A | Discharge status: Home / Self Care | Payer: Medicare Other | Source: Ambulatory Visit | Attending: Nephrology | Admitting: Nephrology

## 2023-04-19 DIAGNOSIS — N189 Chronic kidney disease, unspecified: Secondary | ICD-10-CM | POA: Diagnosis not present

## 2023-04-19 DIAGNOSIS — E559 Vitamin D deficiency, unspecified: Secondary | ICD-10-CM

## 2023-04-19 DIAGNOSIS — N1831 Chronic kidney disease, stage 3a: Secondary | ICD-10-CM

## 2023-04-25 ENCOUNTER — Other Ambulatory Visit: Payer: Self-pay | Admitting: Nurse Practitioner

## 2023-04-29 NOTE — Telephone Encounter (Signed)
Prescription refill request for Eliquis received. Indication: DVT/PE Last office visit: 03/19/23  P Nahser MD Scr: 0.91 on 04/06/23 Age: 89 Weight: 80.5kg  Based on above findings Eliquis 5mg  twice daily is the appropriate dose.  Refill approved.

## 2023-05-19 ENCOUNTER — Other Ambulatory Visit: Payer: Self-pay | Admitting: Nurse Practitioner

## 2023-05-23 ENCOUNTER — Other Ambulatory Visit: Payer: Self-pay

## 2023-05-23 MED ORDER — METOPROLOL TARTRATE 25 MG PO TABS: 12.5000 mg | ORAL_TABLET | Freq: Two times a day (BID) | ORAL | 2 refills | Status: DC

## 2023-06-12 DIAGNOSIS — M810 Age-related osteoporosis without current pathological fracture: Secondary | ICD-10-CM | POA: Diagnosis not present

## 2023-06-24 ENCOUNTER — Telehealth: Payer: Self-pay | Admitting: Cardiovascular Disease

## 2023-06-24 NOTE — Telephone Encounter (Signed)
*  STAT* If patient is at the pharmacy, call can be transferred to refill team.   1. Which medications need to be refilled? (please list name of each medication and dose if known) amLODipine (NORVASC) 2.5 MG tablet    4. Which pharmacy/location (including street and city if local pharmacy) is medication to be sent to? [facility] - [place], [place] - [address]     5. Do they need a 30 day or 90 day supply? 90

## 2023-06-28 ENCOUNTER — Other Ambulatory Visit: Payer: Self-pay | Admitting: Family Medicine

## 2023-06-28 DIAGNOSIS — Z1231 Encounter for screening mammogram for malignant neoplasm of breast: Secondary | ICD-10-CM

## 2023-07-02 ENCOUNTER — Ambulatory Visit: Payer: Medicare Other | Admitting: Podiatry

## 2023-07-02 ENCOUNTER — Encounter: Payer: Self-pay | Admitting: Podiatry

## 2023-07-02 VITALS — Ht 61.0 in

## 2023-07-02 DIAGNOSIS — M79674 Pain in right toe(s): Secondary | ICD-10-CM | POA: Diagnosis not present

## 2023-07-02 DIAGNOSIS — I739 Peripheral vascular disease, unspecified: Secondary | ICD-10-CM | POA: Diagnosis not present

## 2023-07-02 DIAGNOSIS — L84 Corns and callosities: Secondary | ICD-10-CM

## 2023-07-02 DIAGNOSIS — M79675 Pain in left toe(s): Secondary | ICD-10-CM

## 2023-07-02 DIAGNOSIS — B351 Tinea unguium: Secondary | ICD-10-CM

## 2023-07-09 ENCOUNTER — Other Ambulatory Visit: Payer: Self-pay

## 2023-07-09 ENCOUNTER — Encounter: Payer: Self-pay | Admitting: Podiatry

## 2023-07-09 ENCOUNTER — Emergency Department (HOSPITAL_COMMUNITY): Payer: Medicare Other

## 2023-07-09 ENCOUNTER — Encounter (HOSPITAL_COMMUNITY): Payer: Self-pay

## 2023-07-09 ENCOUNTER — Emergency Department (HOSPITAL_COMMUNITY)
Admission: EM | Admit: 2023-07-09 | Discharge: 2023-07-09 | Disposition: A | Discharge status: Home / Self Care | Payer: Medicare Other | Attending: Emergency Medicine | Admitting: Emergency Medicine

## 2023-07-09 ENCOUNTER — Emergency Department (HOSPITAL_BASED_OUTPATIENT_CLINIC_OR_DEPARTMENT_OTHER): Payer: Medicare Other

## 2023-07-09 DIAGNOSIS — R600 Localized edema: Secondary | ICD-10-CM | POA: Diagnosis not present

## 2023-07-09 DIAGNOSIS — E785 Hyperlipidemia, unspecified: Secondary | ICD-10-CM | POA: Diagnosis not present

## 2023-07-09 DIAGNOSIS — I10 Essential (primary) hypertension: Secondary | ICD-10-CM | POA: Diagnosis not present

## 2023-07-09 DIAGNOSIS — M8580 Other specified disorders of bone density and structure, unspecified site: Secondary | ICD-10-CM | POA: Diagnosis not present

## 2023-07-09 DIAGNOSIS — M79622 Pain in left upper arm: Secondary | ICD-10-CM | POA: Diagnosis not present

## 2023-07-09 DIAGNOSIS — Z7901 Long term (current) use of anticoagulants: Secondary | ICD-10-CM | POA: Insufficient documentation

## 2023-07-09 DIAGNOSIS — R079 Chest pain, unspecified: Secondary | ICD-10-CM | POA: Diagnosis not present

## 2023-07-09 DIAGNOSIS — I700 Atherosclerosis of aorta: Secondary | ICD-10-CM | POA: Diagnosis not present

## 2023-07-09 DIAGNOSIS — M7989 Other specified soft tissue disorders: Secondary | ICD-10-CM | POA: Diagnosis not present

## 2023-07-09 DIAGNOSIS — E039 Hypothyroidism, unspecified: Secondary | ICD-10-CM | POA: Diagnosis not present

## 2023-07-09 DIAGNOSIS — J9811 Atelectasis: Secondary | ICD-10-CM | POA: Diagnosis not present

## 2023-07-09 LAB — BASIC METABOLIC PANEL
Anion gap: 13 (ref 5–15)
BUN: 23 mg/dL (ref 8–23)
CO2: 22 mmol/L (ref 22–32)
Calcium: 9.3 mg/dL (ref 8.9–10.3)
Chloride: 105 mmol/L (ref 98–111)
Creatinine, Ser: 0.94 mg/dL (ref 0.44–1.00)
GFR, Estimated: 58 mL/min — ABNORMAL LOW (ref >60)
Glucose, Bld: 88 mg/dL (ref 70–99)
Potassium: 4.3 mmol/L (ref 3.5–5.1)
Sodium: 140 mmol/L (ref 135–145)

## 2023-07-09 LAB — CBC
HCT: 45.8 % (ref 36.0–46.0)
Hemoglobin: 14.7 g/dL (ref 12.0–15.0)
MCH: 30.6 pg (ref 26.0–34.0)
MCHC: 32.1 g/dL (ref 30.0–36.0)
MCV: 95.4 fL (ref 80.0–100.0)
Platelets: 180 10*3/uL (ref 150–400)
RBC: 4.8 MIL/uL (ref 3.87–5.11)
RDW: 14 % (ref 11.5–15.5)
WBC: 3.3 10*3/uL — ABNORMAL LOW (ref 4.0–10.5)
nRBC: 0 % (ref 0.0–0.2)

## 2023-07-09 LAB — BRAIN NATRIURETIC PEPTIDE: B Natriuretic Peptide: 60.4 pg/mL (ref 0.0–100.0)

## 2023-07-09 LAB — TROPONIN I (HIGH SENSITIVITY)
Troponin I (High Sensitivity): 6 ng/L (ref <18)
Troponin I (High Sensitivity): 6 ng/L (ref <18)

## 2023-07-09 NOTE — ED Provider Notes (Signed)
[place] EMERGENCY DEPARTMENT AT [facility] Provider Note   CSN: [id number] Arrival date & time: 07/09/23  1525     History {Add pertinent medical, surgical, social history, OB history to HPI:1} Chief Complaint  Patient presents with  . Hypertension    Lynn Brown is a 89 y.o. female.   Hypertension    Pt states she was having some pain in her arm off and on on the left side.  The pain has been ongoing for a few weeks.  Sometimes it hurts a little more but nothing definitely brings it on.   No fever or cough.  Every now and then she gets some pain in her chest.   Every now and then a pin sticking sensation in her shoulder. No trouble with speech or balance.  No new weakness or numbness.   Home Medications Prior to Admission medications   Medication Sig Start Date End Date Taking? Authorizing Provider  Acetaminophen 500 MG capsule Take 500 mg by mouth every 6 (six) hours as needed for pain.    [provider]  alendronate (FOSAMAX) 70 MG tablet Take by mouth. 01/04/16   [provider]  ALPRAZolam Prudy Feeler) 0.5 MG tablet Take by mouth.    [provider]  amLODipine (NORVASC) 2.5 MG tablet TAKE 1 TABLET BY MOUTH DAILY 05/20/23   Nahser, Deloris Ping, MD  apixaban (ELIQUIS) 5 MG TABS tablet TAKE 1 TABLET BY MOUTH TWICE  DAILY 04/29/23   Nahser, Deloris Ping, MD  ascorbic acid (VITAMIN C) 500 MG tablet Take 500 mg by mouth in the morning and at bedtime.    [provider]  benzonatate (TESSALON) 100 MG capsule Take 1 capsule (100 mg total) by mouth every 8 (eight) hours. 04/06/23   Elpidio Anis, PA-C  Calcium Citrate-Vitamin D 315-250 MG-UNIT TABS Take 1 tablet by mouth daily.    [provider]  cetirizine (ZYRTEC) 10 MG tablet Take 10 mg by mouth daily as needed (allergies.). 09/07/19   [provider]  Cholecalciferol 50 MCG (2000 UT) TABS Take by mouth.    [provider]  clorazepate (TRANXENE) 3.75 MG tablet      [provider]  Coenzyme Q10 (COQ10 PO) Take 1 capsule by mouth daily at 2 PM.    [provider]  dapagliflozin propanediol (FARXIGA) 10 MG TABS tablet Take 1 tablet (10 mg total) by mouth daily before breakfast. 08/10/22   Swinyer, Zachary George, NP  denosumab (PROLIA) 60 MG/ML SOSY injection 60 mg    [provider]  furosemide (LASIX) 20 MG tablet TAKE 1 TABLET BY MOUTH AS NEEDED FOR FLUID OR EDEMA 01/09/21   Lyn Records, MD  furosemide (LASIX) 40 MG tablet     [provider]  levothyroxine (SYNTHROID) 112 MCG tablet Take 112 mcg by mouth every morning. 06/30/20   [provider]  magnesium oxide (MAG-OX) 400 MG tablet Take 400 mg by mouth every evening.     [provider]  methocarbamol (ROBAXIN) 500 MG tablet Take 1 tablet (500 mg total) by mouth every 6 (six) hours as needed for muscle spasms. 01/05/21   Maczis, Elmer Sow, PA-C  metoprolol tartrate (LOPRESSOR) 25 MG tablet Take 0.5 tablets (12.5 mg total) by mouth 2 (two) times daily. 05/23/23   Nahser, Deloris Ping, MD  nitroGLYCERIN (NITROSTAT) 0.4 MG SL tablet Place 1 tablet (0.4 mg total) under the tongue every 5 (five) minutes as needed for chest pain. 07/28/21  Lyn Records, MD  potassium chloride (KLOR-CON) 10 MEQ tablet     [provider]  rosuvastatin (CRESTOR) 20 MG tablet Take 20 mg by mouth every evening.     [provider]  spironolactone (ALDACTONE) 25 MG tablet TAKE 1 TABLET BY MOUTH DAILY 09/24/22   Swinyer, Zachary George, NP  traMADol (ULTRAM) 50 MG tablet Take 50 mg by mouth 3 (three) times daily as needed. 12/16/21   [provider]  trospium (SANCTURA) 20 MG tablet Take 20 mg by mouth 2 (two) times daily.    [provider]  VITAMIN D PO Take 1 tablet by mouth daily at 2 PM.    [provider]      Allergies    Patient has no known allergies.    Review of Systems   Review of Systems  Physical Exam Updated Vital Signs BP  130/75   Pulse (!) 58   Temp 98.1 F (36.7 C) (Oral)   Resp 13   Ht 1.549 m (5\' 1" )   Wt 80.3 kg   SpO2 100%   BMI 33.44 kg/m  Physical Exam Vitals and nursing note reviewed.  Constitutional:      General: She is not in acute distress.    Appearance: She is well-developed.  HENT:     Head: Normocephalic and atraumatic.     Right Ear: External ear normal.     Left Ear: External ear normal.  Eyes:     General: No scleral icterus.       Right eye: No discharge.        Left eye: No discharge.     Conjunctiva/sclera: Conjunctivae normal.  Neck:     Trachea: No tracheal deviation.  Cardiovascular:     Rate and Rhythm: Normal rate and regular rhythm.  Pulmonary:     Effort: Pulmonary effort is normal. No respiratory distress.     Breath sounds: Normal breath sounds. No stridor. No wheezing or rales.  Abdominal:     General: Bowel sounds are normal. There is no distension.     Palpations: Abdomen is soft.     Tenderness: There is no abdominal tenderness. There is no guarding or rebound.  Musculoskeletal:        General: No tenderness or deformity.     Cervical back: Neck supple.     Right lower leg: No edema.     Left lower leg: Edema present.  Skin:    General: Skin is warm and dry.     Findings: No rash.  Neurological:     General: No focal deficit present.     Mental Status: She is alert.     Cranial Nerves: No cranial nerve deficit, dysarthria or facial asymmetry.     Sensory: No sensory deficit.     Motor: No abnormal muscle tone or seizure activity.     Coordination: Coordination normal.  Psychiatric:        Mood and Affect: Mood normal.    ED Results / Procedures / Treatments   Labs (all labs ordered are listed, but only abnormal results are displayed) Labs Reviewed  BASIC METABOLIC PANEL - Abnormal; Notable for the following components:      Result Value   GFR, Estimated 58 (*)    All other components within normal limits  CBC - Abnormal; Notable for the  following components:   WBC 3.3 (*)    All other components within normal limits  BRAIN NATRIURETIC PEPTIDE  TROPONIN  I (HIGH SENSITIVITY)  TROPONIN I (HIGH SENSITIVITY)    EKG None  Radiology DG Chest 2 View Result Date: 07/09/2023 CLINICAL DATA:  Chest pain. EXAM: CHEST - 2 VIEW COMPARISON:  Chest radiograph dated 04/06/2023. FINDINGS: Shallow inspiration. Bilateral interstitial densities, likely atelectasis. Infiltrate is less likely. No focal consolidation, pleural effusion or pneumothorax. Stable cardiac silhouette. Atherosclerotic calcification of the aorta. Osteopenia with degenerative changes of the spine. No acute osseous pathology. IMPRESSION: Shallow inspiration with bilateral atelectasis. Electronically Signed   By: Elgie Collard M.D.   On: 07/09/2023 17:12    Procedures Procedures  {Document cardiac monitor, telemetry assessment procedure when appropriate:1}  Medications Ordered in ED Medications - No data to display  ED Course/ Medical Decision Making/ A&P Clinical Course as of 07/09/23 1922  Tue Jul 09, 2023  1912 Delta troponin normal [JK]  1912 Doppler study without evidence of DVT [JK]    Clinical Course User Index [JK] Linwood Dibbles, MD   {   Click here for ABCD2, HEART and other calculatorsREFRESH Note before signing :1}                              Medical Decision Making Problems Addressed: Hypertension, unspecified type: acute illness or injury that poses a threat to life or bodily functions  Amount and/or Complexity of Data Reviewed Labs: ordered. Decision-making details documented in ED Course. Radiology: ordered and independent interpretation performed.   Patient presented to the ED for evaluation of high blood pressure.  She is not experiencing any chest pain but has noted some pain in her left arm recently.  Considered to the possibility of hypertensive emergency as well as acute coronary syndrome.  ED workup overall reassuring.  Patient does  not have any significant metabolic abnormalities serial troponins are normal.  I doubt ACS.  Patient also had noted some swelling in her legs.  {Document critical care time when appropriate:1} {Document review of labs and clinical decision tools ie heart score, Chads2Vasc2 etc:1}  {Document your independent review of radiology images, and any outside records:1} {Document your discussion with family members, caretakers, and with consultants:1} {Document social determinants of health affecting pt's care:1} {Document your decision making why or why not admission, treatments were needed:1} Final Clinical Impression(s) / ED Diagnoses Final diagnoses:  None    Rx / DC Orders ED Discharge Orders     None

## 2023-07-09 NOTE — Discharge Instructions (Addendum)
Continue your current medications for your blood pressure.  Keep a record of it daily and follow-up with your primary care doctor to review those results.  Return to the ED as needed for worsening symptoms

## 2023-07-09 NOTE — ED Provider Triage Note (Signed)
Emergency Medicine Provider Triage Evaluation Note  Lynn Brown , a 89 y.o. female  was evaluated in triage.  Pt complains of Left upper arm pain, come and goes, not currently in pain. She thinks maybe her arm hurts worse when moving. Denies CP, SOB, cough, fevers, chills. Patient reports chronic BLE edema.   Review of Systems  Positive: Arm pain  Negative: SOB, CP  Physical Exam  BP (!) 141/79 (BP Location: Right Arm)   Pulse 78   Temp 98.1 F (36.7 C) (Oral)   Resp 18   Ht 5\' 1"  (1.549 m)   Wt 80.3 kg   SpO2 97%   BMI 33.44 kg/m  Gen:   Awake, no distress   Resp:  Normal effort  MSK:   Moves extremities without difficulty  Other:    Medical Decision Making  Medically screening exam initiated at 4:06 PM.  Appropriate orders placed.  Lynn Brown was informed that the remainder of the evaluation will be completed by another provider, this initial triage assessment does not replace that evaluation, and the importance of remaining in the ED until their evaluation is complete.  Has past medical history of hypothyroid, hypertension, hyperlipidemia, venous insufficiency.   Smitty Knudsen, PA-C 07/09/23 1610

## 2023-07-09 NOTE — ED Notes (Signed)
Patient verbalizes understanding of discharge instructions. Opportunity for questioning and answers were provided. Armband removed by staff, pt discharged from ED. Pt taken to ED waiting room via wheel chair.

## 2023-07-09 NOTE — ED Triage Notes (Signed)
Patient reports initermittent left arm pain and her bp has been fluctuating.  Patient denies chest pain reports sob of breath but no more than normal and bilateral leg swelling which is her base line.

## 2023-07-09 NOTE — Progress Notes (Signed)
  Subjective:  Patient ID: Lynn Brown, female    DOB: 1932-01-24,  MRN: [id number]  Lynn Brown presents to clinic today for at risk foot care. Patient has h/o PAD and callus(es) left foot and painful mycotic toenails that are difficult to trim. Painful toenails interfere with ambulation. Aggravating factors include wearing enclosed shoe gear. Pain is relieved with periodic professional debridement. Painful calluses are aggravated when weightbearing with and without shoegear. Pain is relieved with periodic professional debridement.  Chief Complaint  Patient presents with   RFC    She is here for a nail trim, PCP is Dr. Teresa and seen about 3 months ago.    New problem(s): None.   PCP is Teresa Channel, MD.  No Known Allergies  Review of Systems: Negative except as noted in the HPI.  Objective: No changes noted in today's physical examination. There were no vitals filed for this visit. Lynn Brown is a pleasant 89 y.o. female WD, WN in NAD. AAO x 3.  Vascular Examination: CFT <3 seconds b/l. DP pulses faintly palpable b/l. PT pulses nonpalpable b/l. Skin temperature gradient warm to cool b/l. No pain with calf compression. No ischemia or gangrene. No cyanosis or clubbing noted b/l. Pedal hair absent.   Neurological Examination: Sensation grossly intact b/l with 10 gram monofilament. Vibratory sensation intact b/l.   Dermatological Examination: Pedal skin warm and supple b/l.   No open wounds. No interdigital macerations.  Toenails 1-5 b/l thick, discolored, elongated with subungual debris and pain on dorsal palpation.    Hyperkeratotic lesion(s) left great toe.  No erythema, no edema, no drainage, no fluctuance.  Musculoskeletal Examination: Muscle strength 5/5 to all lower extremity muscle groups bilaterally. HAV with bunion deformity noted b/l LE. Pes planus deformity noted bilateral LE.  Radiographs: None  Assessment/Plan: 1. Pain due to onychomycosis of toenails  of both feet   2. Callus   3. PVD (peripheral vascular disease) (HCC)    -Patient was evaluated today. All questions/concerns addressed on today's visit. -Continue supportive shoe gear daily. -Toenails 1-5 b/l were debrided in length and girth with sterile nail nippers and dremel without iatrogenic bleeding.  -Callus(es) left great toe pared utilizing sterile scalpel blade without complication or incident. Total number debrided =1. -Patient/POA to call should there be question/concern in the interim.   Return in about 3 months (around 09/29/2023).  Delon LITTIE Merlin, DPM      [place] LOCATION: [address] [place] [address]                   Office [phone]   [facility] LOCATION: [address] [place], [place] [address] Office [phone]

## 2023-07-11 DIAGNOSIS — I10 Essential (primary) hypertension: Secondary | ICD-10-CM | POA: Diagnosis not present

## 2023-07-14 ENCOUNTER — Other Ambulatory Visit: Payer: Self-pay | Admitting: Nurse Practitioner

## 2023-07-16 ENCOUNTER — Ambulatory Visit: Payer: Medicare Other

## 2023-07-17 ENCOUNTER — Ambulatory Visit: Payer: Medicare Other

## 2023-08-06 DIAGNOSIS — R600 Localized edema: Secondary | ICD-10-CM | POA: Diagnosis not present

## 2023-08-06 DIAGNOSIS — I129 Hypertensive chronic kidney disease with stage 1 through stage 4 chronic kidney disease, or unspecified chronic kidney disease: Secondary | ICD-10-CM | POA: Diagnosis not present

## 2023-08-06 DIAGNOSIS — N1831 Chronic kidney disease, stage 3a: Secondary | ICD-10-CM | POA: Diagnosis not present

## 2023-08-06 DIAGNOSIS — I5032 Chronic diastolic (congestive) heart failure: Secondary | ICD-10-CM | POA: Diagnosis not present

## 2023-08-13 ENCOUNTER — Ambulatory Visit: Payer: Medicare Other

## 2023-08-14 DIAGNOSIS — N390 Urinary tract infection, site not specified: Secondary | ICD-10-CM | POA: Diagnosis not present

## 2023-08-14 DIAGNOSIS — N1831 Chronic kidney disease, stage 3a: Secondary | ICD-10-CM | POA: Diagnosis not present

## 2023-08-15 ENCOUNTER — Ambulatory Visit
Admission: RE | Admit: 2023-08-15 | Discharge: 2023-08-15 | Disposition: A | Discharge status: Home / Self Care | Payer: Medicare Other | Source: Ambulatory Visit | Attending: Family Medicine | Admitting: Family Medicine

## 2023-08-15 DIAGNOSIS — Z1231 Encounter for screening mammogram for malignant neoplasm of breast: Secondary | ICD-10-CM

## 2023-08-21 DIAGNOSIS — N1831 Chronic kidney disease, stage 3a: Secondary | ICD-10-CM | POA: Diagnosis not present

## 2023-08-21 DIAGNOSIS — I129 Hypertensive chronic kidney disease with stage 1 through stage 4 chronic kidney disease, or unspecified chronic kidney disease: Secondary | ICD-10-CM | POA: Diagnosis not present

## 2023-08-21 DIAGNOSIS — E559 Vitamin D deficiency, unspecified: Secondary | ICD-10-CM | POA: Diagnosis not present

## 2023-08-22 DIAGNOSIS — H524 Presbyopia: Secondary | ICD-10-CM | POA: Diagnosis not present

## 2023-08-22 DIAGNOSIS — Z961 Presence of intraocular lens: Secondary | ICD-10-CM | POA: Diagnosis not present

## 2023-08-22 DIAGNOSIS — H5203 Hypermetropia, bilateral: Secondary | ICD-10-CM | POA: Diagnosis not present

## 2023-08-22 DIAGNOSIS — H52223 Regular astigmatism, bilateral: Secondary | ICD-10-CM | POA: Diagnosis not present

## 2023-08-22 DIAGNOSIS — H04123 Dry eye syndrome of bilateral lacrimal glands: Secondary | ICD-10-CM | POA: Diagnosis not present

## 2023-09-09 ENCOUNTER — Telehealth: Payer: Self-pay | Admitting: Cardiovascular Disease

## 2023-09-09 ENCOUNTER — Encounter: Payer: Self-pay | Admitting: Cardiovascular Disease

## 2023-09-09 NOTE — Telephone Encounter (Signed)
 Called and spoke to patient and patient reports blood pressure has been elevated, reports headaches, denies dizziness, blurred vision. Appointment made to see provider 4/15 @8 :00am. Advise patient if symptoms get worse or sob, dizziness, chest pain, headaches, elevated blood pressure to go to Emergency Room. Understanding verbalized.

## 2023-09-09 NOTE — Progress Notes (Unsigned)
  Cardiology Office Note:  .   Date:  09/10/2023  ID:  [name], DOB [birth year], MRN [id number] PCP: Victorio Grave, MD  [facility] Providers Cardiologist:  Ahmad Alert, MD  Jerry Morgans, now [name]    History of Present Illness: .     Oct, 22, 2024  Lynn Brown is a 89 y.o. female with hx of HTN, nephrectomy as a kidney doner, HLD , PAD, s/p SFA and popliteal stenting in 04/2020 Seen with her daughter, Lynn Brown   Chronic leg edema Cad - LAD aneurism, extasia of RCA  S/p STEMI thought to be microembolism  Eliquis was started for her coronary microembolism but then discontinued  Eventually developed bilateral submassive pulmonary embolus and was stared back on Eliquis   Has labile BP   No cp Occasional dyspnea   Does a little housework , not much    September 10, 2023: Lynn Brown is seen today for follow-up of some elevated blood pressure readings.  She has had quite variable blood pressure in the past.  She is a previous patient of Dr. Ardell Beauvais and is seeing Colonel Dears, NP in the past.  BP has been quite variable  BMP from recent labs looks good   ROS:   Studies Reviewed: .         Risk Assessment/Calculations:             Physical Exam:    Physical Exam: Blood pressure 112/68, pulse 65, height 5\' 1"  (1.549 m), weight 176 lb 3.2 oz (79.9 kg), SpO2 94%.       GEN:  elderly female  in no acute distress HEENT: Normal NECK: No JVD; No carotid bruits LYMPHATICS: No lymphadenopathy CARDIAC: RRR , no murmurs, rubs, gallops RESPIRATORY:  Clear to auscultation without rales, wheezing or rhonchi  ABDOMEN: Soft, non-tender, non-distended MUSCULOSKELETAL:  1-2 + left lower leg edema , L>R .  No deformity  SKIN: Warm and dry NEUROLOGIC:  Alert and oriented x 3   ASSESSMENT AND PLAN: .     Leg edema: She has significant leg edema.  I suspect this is because of her chronic venous insufficiency due to her previous DVT.  She is also fairly  immobile.  We discussed the various conservative ways to reduce the leg edema.  I given her recommendations on how to get the lounge doctor leg rest.  She is already on Lasix.    We discussed compression hose. We discussed referring her to [facility] for further evaluation.  She has been seen at [facility] for her chronic venous disease in the past but does not want to start going to another clinic at this time.  She has seen Dr. Edgardo Goodwill at [facility] for this   2.  History of DVT/PE :/ Continue eliquis         Dispo: 1 year    Signed, Ahmad Alert, MD

## 2023-09-09 NOTE — Telephone Encounter (Signed)
 Pt c/o BP issue:  1. What are your last 5 BP readings?  This morning 152/88 pulse was 57, 153/91 pulse 64 later this morning- yesterday 149/89 pulse 66, 170/102 pulse 66, 160/96 pulse 54- Saturday 147/96 pulse 57   2. Are you having any other symptoms (ex. Dizziness, headache, blurred vision, passed out)?  Hurting in the back of her head and hurting in her right shoulder  3. What is your medication issue? Blood pressure is running high- patient said a few weeks ago it was running low

## 2023-09-10 ENCOUNTER — Encounter: Payer: Self-pay | Admitting: Cardiovascular Disease

## 2023-09-10 ENCOUNTER — Ambulatory Visit: Attending: Cardiovascular Disease | Admitting: Cardiovascular Disease

## 2023-09-10 VITALS — BP 112/68 | HR 65 | Ht 61.0 in | Wt 176.2 lb

## 2023-09-10 DIAGNOSIS — I10 Essential (primary) hypertension: Secondary | ICD-10-CM

## 2023-09-10 DIAGNOSIS — I2694 Multiple subsegmental pulmonary emboli without acute cor pulmonale: Secondary | ICD-10-CM | POA: Diagnosis not present

## 2023-09-10 NOTE — Patient Instructions (Signed)
 Medication Instructions:  Your physician recommends that you continue on your current medications as directed. Please refer to the Current Medication list given to you today.  *If you need a refill on your cardiac medications before your next appointment, please call your pharmacy*  Follow-Up: At [facility], you and your health needs are our priority.  As part of our continuing mission to provide you with exceptional heart care, we have created designated Provider Care Teams.  These Care Teams include your primary Cardiologist (physician) and Advanced Practice Providers (APPs -  Physician Assistants and Nurse Practitioners) who all work together to provide you with the care you need, when you need it.  Your next appointment:   1 year(s)  The format for your next appointment:   In Person  Provider:   Kristeen Miss, MD {  Other Instructions   1st Floor: - Lobby - Registration  - Pharmacy  - Lab - Cafe  2nd Floor: - PV Lab - Diagnostic Testing (echo, CT, nuclear med)  3rd Floor: - Vacant  4th Floor: - TCTS (cardiothoracic surgery) - AFib Clinic - Structural Heart Clinic - Vascular Surgery  - Vascular Ultrasound  5th Floor: - [facility]Care Cardiology (general and EP) - Clinical Pharmacy for coumadin, hypertension, lipid, weight-loss medications, and med management appointments    Valet parking services will be available as well.

## 2023-09-18 ENCOUNTER — Ambulatory Visit: Payer: Medicare Other | Admitting: Cardiovascular Disease

## 2023-10-14 DIAGNOSIS — I10 Essential (primary) hypertension: Secondary | ICD-10-CM | POA: Diagnosis not present

## 2023-10-14 DIAGNOSIS — R0600 Dyspnea, unspecified: Secondary | ICD-10-CM | POA: Diagnosis not present

## 2023-10-14 DIAGNOSIS — I5032 Chronic diastolic (congestive) heart failure: Secondary | ICD-10-CM | POA: Diagnosis not present

## 2023-10-17 ENCOUNTER — Telehealth: Payer: Self-pay | Admitting: Cardiovascular Disease

## 2023-10-17 NOTE — Telephone Encounter (Signed)
 Called patient's daughter, DPR, about message. Patient saw Dr. Clearnce Curia on 10/14/23 for chest pain and BP issues. They instructed patient to call her cardiology office and get an appointment to see provider and get EKG. They were unable to obtain an EKG. Made patient an appointment to see APP tomorrow.

## 2023-10-17 NOTE — Progress Notes (Signed)
 Cardiology Office Note    Date:  10/18/2023  ID:  Lynn Brown, Lynn Brown Sep 14, 1931, MRN [id number] PCP:  Victorio Grave, MD  Cardiologist:  Ahmad Alert, MD  Electrophysiologist:  None   Chief Complaint: Hypertension and chest pain   History of Present Illness: .    Lynn Brown is a 89 y.o. female with visit-pertinent history of nephrectomy as a kidney donor, hyperlipidemia, PAD s/p L SFA and L popliteal stenting in 04/2020, chronic lower extremity edema, hyperlipidemia, "leaky heart valve", hypothyroidism, LAD aneurysm/circumflex ectasia/RCA ectasia.  Presumed embolic micro embolism with non-ST elevation MI in October 2019.  Eliquis  was started as therapy for coronary microembolization from an aneurysm and eventually discontinued, subsequently had development of bilateral submassive pulmonary emboli placed back on Eliquis  for lifelong anticoagulation.  In October 2019 patient had an NSTEMI with cardiac catheterization that revealed LAD aneurysm, LCx and RCA ectasia, presumed embolic microembolization which was treated with Eliquis .  She was admitted 3/15 through 08/12/2020 with submassive pulmonary embolism with associated RV strain and severe pulmonary hypertension.  Her PE was felt to be unprovoked, requiring lifelong anticoagulation.  Echocardiogram on 11/09/2020 indicated LVEF of 55 to 60%, no RWMA, mild LVH, G1 DD, RV systolic function was mildly reduced, moderately elevated pulmonary artery systolic pressures, mitral valve was abnormal with trivial mitral valve regurgitation, aortic valve rotation was not visualized.  Patient was last seen in clinic on 09/10/2023, she had noted that her blood pressure had been quite variable, in office that day it was 112/68.  It was noted that she had significant leg edema, suspected this was due to chronic venous insufficiency due to her previous DVT.  Referral to [facility] was discussed however patient declined.  On 10/17/2023 patient's daughter notified the  office that she had gone to see her PCP, patient reported intermittent chest pain.  Provider was unable to obtain EKG and recommended following up with her cardiologist.  Today she presents regarding reports of chest pain.  She reports that she has been having intermittent chest pressure for the last year. She notes typical and atypical symptoms.  Patient reports left-sided chest discomfort described as an ache that typically lasts under 1-2 minutes, she does also note some associated left arm pain.  She also reports left arm pain not associated with chest pain that can affect her for 30 minutes.  Patient reports baseline shortness of breath, denies any recent significant changes.  Patient believes chest discomfort typically occurs after exertion although she is unsure, offers overall vague descriptions.  She notes that her biggest concern is fluctuating blood pressure, notes some for some days her systolic will be at 160, then the following week her blood pressure will be well-controlled followed by low blood pressures.  She is unsure if she is having chest discomfort with elevated blood pressures, reports she has not made note of this.  Patient also notes lower extremity edema, reviewed by Dr. Alroy Aspen at appointment a month ago, felt to be related to chronic venous insufficiency due to previous DVT.  Patient notes that she does have increased lower extremity edema that improves with her fluid pill.  On further discussion patient reports that Friday her amlodipine  was increased to 5 mg daily, she reports that since increasing she has not had any further chest discomfort. ROS: .   Today she denies fatigue, palpitations, melena, hematuria, hemoptysis, diaphoresis, weakness, presyncope, syncope, orthopnea, and PND.  All other systems are reviewed and otherwise negative. Studies Reviewed: .  EKG:  EKG is ordered today, personally reviewed, demonstrating  EKG Interpretation Date/Time:  Friday Oct 18 2023  11:15:16 EDT Ventricular Rate:  72 PR Interval:  184 QRS Duration:  76 QT Interval:  396 QTC Calculation: 433 R Axis:   -26  Text Interpretation: Sinus rhythm with Premature atrial complexes When compared with ECG of 09-Jul-2023 15:33, Premature atrial complexes are now Present Confirmed by Synethia Endicott [id number]) on 10/18/2023 7:31:07 PM   CV Studies: Cardiac studies reviewed are outlined and summarized above. Otherwise please see EMR for full report. Cardiac Studies & Procedures   ______________________________________________________________________________________________ CARDIAC CATHETERIZATION  CARDIAC CATHETERIZATION 03/10/2018  Conclusion  Coronary artery ectasia involving the proximal and mid circumflex as well as the proximal to mid RCA.  The left circumflex appears to have eccentric globular filling defect consistent with thrombus.  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  Both vessels have slow filling and slow flow.  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  Normal left ventricular function.  Supraventricular tachycardia 140 bpm upon entering the Cath Lab.  15 mg of IV metoprolol  was given and 5 mg aliquots over 15 minutes and led to resolution.  RECOMMENDATIONS:   Dual antiplatelet therapy with aspirin  and Plavix  versus anticoagulation with warfarin/NOAC -there is more data in favor of anticoagulation than dual antiplatelet therapy.  Would not recommend percutaneous intervention and circumflex.  If LAD aneurysm grows, could consider stent graft.  Aneurysm can be followed by coronary CTA if desired.  Recommend Aspirin  81mg  daily for moderate CAD.  Findings Coronary Findings Diagnostic  Dominance: Right  Left Anterior Descending Non-stenotic Ost LAD to Prox LAD lesion.  Left Circumflex Non-stenotic Ost  Cx to Mid Cx lesion.  Right Coronary Artery Non-stenotic Prox RCA to Mid RCA lesion.  Intervention  No interventions have been documented.   STRESS TESTS  NM MYOCAR MULTI W/SPECT W 10/08/2016  Narrative CLINICAL DATA:  Chest pain. Hypertension. Hypercholesterolemia. Shortness of breath.  EXAM: MYOCARDIAL IMAGING WITH SPECT (REST AND PHARMACOLOGIC-STRESS)  GATED LEFT VENTRICULAR WALL MOTION STUDY  LEFT VENTRICULAR EJECTION FRACTION  TECHNIQUE: Standard myocardial SPECT imaging was performed after resting intravenous injection of 10 mCi Tc-41m tetrofosmin . Subsequently, intravenous infusion of Lexiscan  was performed under the supervision of the Cardiology staff. At peak effect of the drug, 30 mCi Tc-61m tetrofosmin  was injected intravenously and standard myocardial SPECT imaging was performed. Quantitative gated imaging was also performed to evaluate left ventricular wall motion, and estimate left ventricular ejection fraction.  COMPARISON:  01/16/2012  FINDINGS: Initially the computer drawn contours or inconsistent. I manually re- processed the images using the mask and constrained functions.  Perfusion: No decreased activity in the left ventricle on stress imaging to suggest reversible ischemia or infarction.  Wall Motion: Normal left ventricular wall motion. No left ventricular dilation.  Left Ventricular Ejection Fraction: 75 %  End diastolic volume 79 ml  End systolic volume 20 ml  IMPRESSION: 1. No reversible ischemia or infarction.  2. Normal left ventricular wall motion.  3. Left ventricular ejection fraction 75%  4. Non invasive risk stratification*: Low  *2012 Appropriate Use Criteria for Coronary Revascularization Focused Update: J Am Coll Cardiol. 2012;59(9):857-881. http://content.dementiazones.com.aspx?articleid=1201161   Electronically Signed By: Freida Jes M.D. On: 10/08/2016 15:36   ECHOCARDIOGRAM  ECHOCARDIOGRAM  COMPLETE 11/09/2020  Narrative ECHOCARDIOGRAM REPORT    Patient Name:   Lynn Brown Date  of Exam: 11/09/2020 Medical Rec #:  [id number]      Height:       66.0 in Accession #:    [id number]     Weight:       187.6 lb Date of Birth:  1931/07/15       BSA:          1.946 m Patient Age:    88 years       BP:           138/70 mmHg Patient Gender: F              HR:           70 bpm. Exam Location:  [address]  Procedure: 2D Echo, 3D Echo, Cardiac Doppler, Color Doppler and Strain Analysis  Indications:    I26.94 Pulmonary embolus.  History:        Patient has prior history of Echocardiogram examinations, most recent 08/10/2020. Previous Myocardial Infarction, Signs/Symptoms:Shortness of Breath; Risk Factors:Hypertension and Dyslipidemia. Pulmonary embolus.  Sonographer:    Henriette Lofty, RDCS Referring Phys: [phone] MATTHEW R HUNSUCKER  IMPRESSIONS   1. Left ventricular ejection fraction, by estimation, is 55 to 60%. Left ventricular ejection fraction by 3D volume is 56 %. The left ventricle has normal function. The left ventricle has no regional wall motion abnormalities. There is mild left ventricular hypertrophy. Left ventricular diastolic parameters are consistent with Grade I diastolic dysfunction (impaired relaxation). There is the interventricular septum is flattened in systole, consistent with right ventricular pressure overload. 2. Right ventricular systolic function is mildly reduced. The right ventricular size is normal. There is moderately elevated pulmonary artery systolic pressure. The estimated right ventricular systolic pressure is 49.2 mmHg. 3. The mitral valve is abnormal. Trivial mitral valve regurgitation. 4. The aortic valve is tricuspid. Aortic valve regurgitation is not visualized.  Comparison(s): Changes from prior study are noted. 08/10/20 EF 55-60%. PA pressure .  FINDINGS Left Ventricle: Left ventricular ejection fraction, by estimation, is 55 to  60%. Left ventricular ejection fraction by 3D volume is 56 %. The left ventricle has normal function. The left ventricle has no regional wall motion abnormalities. The left ventricular internal cavity size was normal in size. There is mild left ventricular hypertrophy. The interventricular septum is flattened in systole, consistent with right ventricular pressure overload. Left ventricular diastolic parameters are consistent with Grade I diastolic dysfunction (impaired relaxation). Indeterminate filling pressures.  Right Ventricle: The right ventricular size is normal. No increase in right ventricular wall thickness. Right ventricular systolic function is mildly reduced. There is moderately elevated pulmonary artery systolic pressure. The tricuspid regurgitant velocity is 3.21 m/s, and with an assumed right atrial pressure of 8 mmHg, the estimated right ventricular systolic pressure is 49.2 mmHg.  Left Atrium: Left atrial size was normal in size.  Right Atrium: Right atrial size was normal in size.  Pericardium: There is no evidence of pericardial effusion.  Mitral Valve: The mitral valve is abnormal. There is mild thickening of the mitral valve leaflet(s). Trivial mitral valve regurgitation.  Tricuspid Valve: The tricuspid valve is grossly normal. Tricuspid valve regurgitation is mild.  Aortic Valve: The aortic valve is tricuspid. Aortic valve regurgitation is not visualized.  Pulmonic Valve: The pulmonic valve was grossly normal. Pulmonic valve regurgitation is mild to moderate.  Aorta: The aortic root and ascending aorta are structurally normal, with no evidence of dilitation.  IAS/Shunts: No atrial level shunt detected by color flow Doppler.   LEFT VENTRICLE PLAX  2D LVIDd:         3.50 cm         Diastology LVIDs:         2.40 cm         LV e' medial:    4.13 cm/s LV PW:         1.10 cm         LV E/e' medial:  9.8 LV IVS:        1.20 cm         LV e' lateral:   4.57 cm/s LVOT  diam:     1.90 cm         LV E/e' lateral: 8.9 LV SV:         57 LV SV Index:   29              2D LVOT Area:     2.84 cm        Longitudinal Strain 2D Strain GLS  -22.0 % (A2C): 2D Strain GLS  -16.1 % (A3C): 2D Strain GLS  -19.3 % (A4C): 2D Strain GLS  -19.1 % Avg:  3D Volume EF LV 3D EF:    Left ventricular ejection fraction by 3D volume is 56 %.  3D Volume EF: 3D EF:        56 % LV EDV:       69 ml LV ESV:       30 ml LV SV:        39 ml  RIGHT VENTRICLE RV Basal diam:  3.20 cm RV S prime:     8.04 cm/s TAPSE (M-mode): 1.3 cm RVSP:           49.2 mmHg  LEFT ATRIUM             Index       RIGHT ATRIUM           Index LA diam:        4.20 cm 2.16 cm/m  RA Pressure: 8.00 mmHg LA Vol (A2C):   38.3 ml 19.68 ml/m RA Area:     14.50 cm LA Vol (A4C):   38.1 ml 19.58 ml/m RA Volume:   32.90 ml  16.91 ml/m LA Biplane Vol: 40.9 ml 21.02 ml/m AORTIC VALVE LVOT Vmax:   94.20 cm/s LVOT Vmean:  62.800 cm/s LVOT VTI:    0.201 m  AORTA Ao Root diam: 3.50 cm Ao Asc diam:  3.80 cm  MITRAL VALVE               TRICUSPID VALVE TR Peak grad:   41.2 mmHg TR Vmax:        321.00 cm/s MV E velocity: 40.50 cm/s  Estimated RAP:  8.00 mmHg MV A velocity: 89.20 cm/s  RVSP:           49.2 mmHg MV E/A ratio:  0.45 SHUNTS Systemic VTI:  0.20 m Systemic Diam: 1.90 cm  Dinah Franco MD Electronically signed by Dinah Franco MD Signature Date/Time: 11/09/2020/12:41:23 PM    Final          ______________________________________________________________________________________________       Current Reported Medications:.    Current Meds  Medication Sig   Acetaminophen  500 MG capsule Take 500 mg by mouth every 6 (six) hours as needed for pain.   ALPRAZolam (XANAX) 0.5 MG tablet Take by mouth.   amLODipine  (NORVASC ) 2.5 MG tablet TAKE 1 TABLET BY MOUTH DAILY   apixaban  (ELIQUIS ) 5 MG  TABS tablet TAKE 1 TABLET BY MOUTH TWICE  DAILY   ascorbic acid (VITAMIN C) 500 MG  tablet Take 500 mg by mouth in the morning and at bedtime.   Calcium  Citrate-Vitamin D  315-250 MG-UNIT TABS Take 1 tablet by mouth daily.   cetirizine (ZYRTEC) 10 MG tablet Take 10 mg by mouth daily as needed (allergies.).   Cholecalciferol  50 MCG (2000 UT) TABS Take by mouth.   clorazepate  (TRANXENE ) 3.75 MG tablet    Coenzyme Q10 (COQ10 PO) Take 1 capsule by mouth daily at 2 PM.   dapagliflozin  propanediol (FARXIGA ) 10 MG TABS tablet TAKE 1 TABLET BY MOUTH DAILY  BEFORE BREAKFAST   denosumab  (PROLIA ) 60 MG/ML SOSY injection 60 mg   levothyroxine  (SYNTHROID ) 112 MCG tablet Take 112 mcg by mouth every morning.   methocarbamol  (ROBAXIN ) 500 MG tablet Take 1 tablet (500 mg total) by mouth every 6 (six) hours as needed for muscle spasms.   metoprolol  tartrate (LOPRESSOR ) 25 MG tablet Take 0.5 tablets (12.5 mg total) by mouth 2 (two) times daily.   nitroGLYCERIN  (NITROSTAT ) 0.4 MG SL tablet Place 1 tablet (0.4 mg total) under the tongue every 5 (five) minutes as needed for chest pain.   rosuvastatin  (CRESTOR ) 20 MG tablet Take 20 mg by mouth every evening.    spironolactone  (ALDACTONE ) 25 MG tablet TAKE 1 TABLET BY MOUTH DAILY    Physical Exam:    VS:  BP 122/74 (BP Location: Left Arm, Patient Position: Sitting, Cuff Size: Normal)   Pulse 72   Ht 5\' 1"  (1.549 m)   Wt 175 lb (79.4 kg)   SpO2 96%   BMI 33.07 kg/m    Wt Readings from Last 3 Encounters:  10/18/23 175 lb (79.4 kg)  09/10/23 176 lb 3.2 oz (79.9 kg)  07/09/23 177 lb (80.3 kg)    GEN: Well nourished, well developed in no acute distress NECK: No JVD; No carotid bruits CARDIAC: RRR, no murmurs, rubs, gallops RESPIRATORY:  Clear to auscultation without rales, wheezing or rhonchi  ABDOMEN: Soft, non-tender, non-distended EXTREMITIES:  1+ bilateral lower extremity edema; No acute deformity     Asessement and Plan:.   CAD: History of NSTEMI in 2019 with LAD aneurysm with suspected microembolization treated with OAC. Today she  reports chest discomfort with typical and atypical symptoms, patient's descriptions are overall vague.  She notes that she has been having intermittent episodes of chest discomfort described as an ache that lasts under 1 to 2 minutes, she reports these have been occurring for the last year.  Also reports intermittent left arm pain that she notes on occasion can occur with chest discomfort but also occurs on its own, she denies any other associated symptoms.  She is unsure if the discomfort is happening with higher blood pressures or with exertion.  Discussed testing with patient, given her history of nephrectomy coronary CTA would not be ideal given need for contrast.  Patient is not interested in pursuing a cardiac catheterization.  Reviewed with Dr. Berry Bristol, DOD at [facility] office, it was recommended that her hypertension be treated and as patient is no interested in pursuing a cardiac catheterization no further workup was recommended at this time.  Discussed with patient and she is in agreement with this.  Following discussion patient notes that her amlodipine  was increased on Friday, following increase she reports her blood pressure has been better controlled and she has not had any further chest pain.  Encouraged patient to continue monitoring, she will notify  the office if worsening.  Reviewed ED precautions.  History of pulmonary embolism on lifelong anticoagulation: Patient reports adherence to Eliquis , she denies any significant bleeding problems.  Chronic DOE/pulmonary hypertension: Patient with history of chronic DOE, echocardiogram in 2022 indicated moderately elevated pulmonary artery systolic pressures.  Today she reports that her breathing has been stable.  Discussed updating echocardiogram, patient deferred at this time.  Chronic HFpEF: LVEF 55 to 60%, mild LVH, G1 DD on echo in 10/2020.  Patient noted to have chronic lower extremity edema by Dr. Alroy Aspen likely related to history of chronic  DVTs, referral to [facility]VS was offered, patient declined.  Today she reports her edema is stable, notes some fluctuation that improves with Lasix .  Hypertension: Blood pressure today 122/74.  Patient reports history of labile blood pressures.  She reports that her amlodipine  was increased to 5 mg daily last week, she plans to follow-up with PCP next week for ongoing monitoring.   Disposition: F/u with Dr. Alroy Aspen in three months.   Signed, Camiya Vinal D Delila Kuklinski, NP

## 2023-10-17 NOTE — Telephone Encounter (Signed)
 Patient's daughter is calling because the patient went to see another provider in regard to her BP. Patient's daughter stated they were recommended that the patient comes and has an EKG completed. Please advise.

## 2023-10-18 ENCOUNTER — Ambulatory Visit: Attending: Cardiology | Admitting: Cardiology

## 2023-10-18 ENCOUNTER — Encounter: Payer: Self-pay | Admitting: Cardiology

## 2023-10-18 VITALS — BP 122/74 | HR 72 | Ht 61.0 in | Wt 175.0 lb

## 2023-10-18 DIAGNOSIS — Z86711 Personal history of pulmonary embolism: Secondary | ICD-10-CM

## 2023-10-18 DIAGNOSIS — I2510 Atherosclerotic heart disease of native coronary artery without angina pectoris: Secondary | ICD-10-CM

## 2023-10-18 DIAGNOSIS — Z7901 Long term (current) use of anticoagulants: Secondary | ICD-10-CM

## 2023-10-18 DIAGNOSIS — I5032 Chronic diastolic (congestive) heart failure: Secondary | ICD-10-CM

## 2023-10-18 DIAGNOSIS — I10 Essential (primary) hypertension: Secondary | ICD-10-CM | POA: Diagnosis not present

## 2023-10-18 DIAGNOSIS — I2541 Coronary artery aneurysm: Secondary | ICD-10-CM

## 2023-10-18 NOTE — Patient Instructions (Signed)
 Medication Instructions:  No changes *If you need a refill on your cardiac medications before your next appointment, please call your pharmacy*  Lab Work: No labs  Testing/Procedures: No testing  Follow-Up: At [facility], you and your health needs are our priority.  As part of our continuing mission to provide you with exceptional heart care, our providers are all part of one team.  This team includes your primary Cardiologist (physician) and Advanced Practice Providers or APPs (Physician Assistants and Nurse Practitioners) who all work together to provide you with the care you need, when you need it.  Your next appointment:   3 month(s)  Provider:   Ahmad Alert, MD    We recommend signing up for the patient portal called "MyChart".  Sign up information is provided on this After Visit Summary.  MyChart is used to connect with patients for Virtual Visits (Telemedicine).  Patients are able to view lab/test results, encounter notes, upcoming appointments, etc.  Non-urgent messages can be sent to your provider as well.   To learn more about what you can do with MyChart, go to [email or web address].

## 2023-10-28 ENCOUNTER — Other Ambulatory Visit: Payer: Self-pay | Admitting: Nurse Practitioner

## 2023-10-28 ENCOUNTER — Other Ambulatory Visit: Payer: Self-pay | Admitting: Cardiovascular Disease

## 2023-10-28 DIAGNOSIS — I10 Essential (primary) hypertension: Secondary | ICD-10-CM | POA: Diagnosis not present

## 2023-10-28 DIAGNOSIS — R0989 Other specified symptoms and signs involving the circulatory and respiratory systems: Secondary | ICD-10-CM | POA: Diagnosis not present

## 2023-10-29 NOTE — Telephone Encounter (Signed)
 Prescription refill request for Eliquis  received. Indication:pe Last office visit:5/25 Scr:0.94  2/25 Age: 89 Weight:79.4  kg  Prescription refilled

## 2023-11-04 DIAGNOSIS — I10 Essential (primary) hypertension: Secondary | ICD-10-CM | POA: Diagnosis not present

## 2023-11-05 ENCOUNTER — Encounter: Payer: Self-pay | Admitting: Podiatry

## 2023-11-05 ENCOUNTER — Ambulatory Visit: Payer: Medicare Other | Admitting: Podiatry

## 2023-11-05 DIAGNOSIS — L84 Corns and callosities: Secondary | ICD-10-CM | POA: Diagnosis not present

## 2023-11-05 DIAGNOSIS — B351 Tinea unguium: Secondary | ICD-10-CM

## 2023-11-05 DIAGNOSIS — M79675 Pain in left toe(s): Secondary | ICD-10-CM

## 2023-11-05 DIAGNOSIS — I739 Peripheral vascular disease, unspecified: Secondary | ICD-10-CM

## 2023-11-05 DIAGNOSIS — M79674 Pain in right toe(s): Secondary | ICD-10-CM

## 2023-11-10 ENCOUNTER — Encounter: Payer: Self-pay | Admitting: Podiatry

## 2023-11-10 NOTE — Progress Notes (Signed)
  Subjective:  Patient ID: Lynn Brown, female    DOB: February 08, 1932,  MRN: [id number]  Lynn Brown presents to clinic today for at risk foot care. Patient has h/o PAD and callus(es) left lower extremity and painful thick toenails that are difficult to trim. Painful toenails interfere with ambulation. Aggravating factors include wearing enclosed shoe gear. Pain is relieved with periodic professional debridement. Painful calluses are aggravated when weightbearing with and without shoegear. Pain is relieved with periodic professional debridement.  Chief Complaint  Patient presents with   Nail Problem    Trim my toenails.   New problem(s): None.   PCP is Victorio Grave, MD. Lynn Brown 06/12/2023  Allergies  Allergen Reactions   Trospium     Other Reaction(s): dry mouth, did not help    Review of Systems: Negative except as noted in the HPI.  Objective: No changes noted in today's physical examination. There were no vitals filed for this visit. Lynn Brown is a pleasant 89 y.o. female WD, WN in NAD. AAO x 3.  Vascular Examination: CFT <3 seconds b/l. DP pulses 1/4 b/l. PT pulses 0/4 b/l. Digital hair absent. Skin temperature gradient warm to warm b/l. No pain with calf compression. No ischemia or gangrene. No cyanosis or clubbing noted b/l. No edema noted b/l LE.   Neurological Examination: Sensation grossly intact b/l with 10 gram monofilament. Vibratory sensation intact b/l.   Dermatological Examination: Pedal skin warm and supple b/l. No open wounds b/l. No interdigital macerations. Toenails 1-5 b/l thick, discolored, elongated with subungual debris and pain on dorsal palpation.  Hyperkeratotic lesion(s) medial IPJ of left great toe.  No erythema, no edema, no drainage, no fluctuance.  Musculoskeletal Examination: Normal muscle strength 5/5 to all lower extremity muscle groups bilaterally. HAV with bunion deformity noted b/l LE. Pes planus deformity noted bilateral LE.Lynn Brown No pain,  crepitus or joint limitation noted with ROM b/l LE.  Patient ambulates independently without assistive aids.  Radiographs: None  Assessment/Plan: 1. Pain due to onychomycosis of toenails of both feet   2. Callus   3. PVD (peripheral vascular disease) (HCC)    Consent given for treatment. Patient examined. All patient's and/or POA's questions/concerns addressed on today's visit. Mycotic toenails 1-5 debrided in length and girth without incident. Callus(es) medial IPJ of left great toe pared with sharp debridement without incident.Continue soft, supportive shoe gear daily. Report any pedal injuries to medical professional. Call office if there are any quesitons/concerns. -Patient/POA to call should there be question/concern in the interim.   Return in about 3 months (around 02/05/2024).  Lynn Brown, DPM      [place] LOCATION: [address], [place] [address]                   Office [phone]   [facility] LOCATION: [address] [place], [place] [address] Office [phone]

## 2023-11-16 ENCOUNTER — Other Ambulatory Visit: Payer: Self-pay | Admitting: Cardiovascular Disease

## 2023-12-06 ENCOUNTER — Other Ambulatory Visit: Payer: Self-pay | Admitting: Cardiovascular Disease

## 2023-12-12 DIAGNOSIS — M810 Age-related osteoporosis without current pathological fracture: Secondary | ICD-10-CM | POA: Diagnosis not present

## 2024-01-01 DIAGNOSIS — H04123 Dry eye syndrome of bilateral lacrimal glands: Secondary | ICD-10-CM | POA: Diagnosis not present

## 2024-01-01 DIAGNOSIS — H43823 Vitreomacular adhesion, bilateral: Secondary | ICD-10-CM | POA: Diagnosis not present

## 2024-01-01 DIAGNOSIS — H33101 Unspecified retinoschisis, right eye: Secondary | ICD-10-CM | POA: Diagnosis not present

## 2024-01-22 ENCOUNTER — Ambulatory Visit: Admitting: Cardiology

## 2024-02-06 ENCOUNTER — Other Ambulatory Visit: Payer: Self-pay | Admitting: Family Medicine

## 2024-02-06 DIAGNOSIS — R0989 Other specified symptoms and signs involving the circulatory and respiratory systems: Secondary | ICD-10-CM | POA: Diagnosis not present

## 2024-02-06 DIAGNOSIS — I471 Supraventricular tachycardia, unspecified: Secondary | ICD-10-CM | POA: Diagnosis not present

## 2024-02-06 DIAGNOSIS — I129 Hypertensive chronic kidney disease with stage 1 through stage 4 chronic kidney disease, or unspecified chronic kidney disease: Secondary | ICD-10-CM | POA: Diagnosis not present

## 2024-02-06 DIAGNOSIS — I5032 Chronic diastolic (congestive) heart failure: Secondary | ICD-10-CM | POA: Diagnosis not present

## 2024-02-06 DIAGNOSIS — N1831 Chronic kidney disease, stage 3a: Secondary | ICD-10-CM | POA: Diagnosis not present

## 2024-02-07 DIAGNOSIS — N1831 Chronic kidney disease, stage 3a: Secondary | ICD-10-CM | POA: Diagnosis not present

## 2024-02-10 DIAGNOSIS — E559 Vitamin D deficiency, unspecified: Secondary | ICD-10-CM | POA: Diagnosis not present

## 2024-02-10 DIAGNOSIS — N1831 Chronic kidney disease, stage 3a: Secondary | ICD-10-CM | POA: Diagnosis not present

## 2024-02-10 DIAGNOSIS — I129 Hypertensive chronic kidney disease with stage 1 through stage 4 chronic kidney disease, or unspecified chronic kidney disease: Secondary | ICD-10-CM | POA: Diagnosis not present

## 2024-02-11 ENCOUNTER — Ambulatory Visit
Admission: RE | Admit: 2024-02-11 | Discharge: 2024-02-11 | Disposition: A | Discharge status: Home / Self Care | Source: Ambulatory Visit | Attending: Family Medicine | Admitting: Family Medicine

## 2024-02-11 DIAGNOSIS — I10 Essential (primary) hypertension: Secondary | ICD-10-CM | POA: Diagnosis not present

## 2024-02-11 DIAGNOSIS — R0989 Other specified symptoms and signs involving the circulatory and respiratory systems: Secondary | ICD-10-CM

## 2024-02-26 ENCOUNTER — Ambulatory Visit: Admitting: Podiatry

## 2024-02-27 ENCOUNTER — Encounter: Payer: Self-pay | Admitting: Cardiology

## 2024-02-27 ENCOUNTER — Ambulatory Visit: Attending: Cardiology | Admitting: Cardiology

## 2024-02-27 VITALS — BP 163/80 | HR 73 | Ht 61.0 in | Wt 177.6 lb

## 2024-02-27 DIAGNOSIS — I7789 Other specified disorders of arteries and arterioles: Secondary | ICD-10-CM | POA: Diagnosis not present

## 2024-02-27 DIAGNOSIS — R600 Localized edema: Secondary | ICD-10-CM | POA: Diagnosis not present

## 2024-02-27 DIAGNOSIS — D6859 Other primary thrombophilia: Secondary | ICD-10-CM | POA: Diagnosis not present

## 2024-02-27 DIAGNOSIS — I10 Essential (primary) hypertension: Secondary | ICD-10-CM

## 2024-02-27 DIAGNOSIS — Z86711 Personal history of pulmonary embolism: Secondary | ICD-10-CM

## 2024-02-27 MED ORDER — ISOSORBIDE MONONITRATE ER 60 MG PO TB24: 60.0000 mg | ORAL_TABLET | Freq: Every day | ORAL | 1 refills | Status: DC

## 2024-02-27 MED ORDER — LABETALOL HCL 100 MG PO TABS: 100.0000 mg | ORAL_TABLET | Freq: Two times a day (BID) | ORAL | 1 refills | Status: DC

## 2024-02-27 NOTE — Progress Notes (Signed)
 Cardiology Office Note:  .   Date:  02/27/2024  ID:  Lynn Brown, DOB 01-Oct-1931, MRN [id number] PCP: Teresa Channel, MD  [facility] Providers Cardiologist:  Aleene Passe, MD (Inactive)   History of Present Illness: .   Lynn Brown is a 89 y.o. 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 Aleene Passe.  She was seen by Reche Mose on 10/18/2023 for chest pain.  At that point, in view of uncontrolled hypertension, recommended aggressive risk modification given her age, single kidney and to avoid any invasive procedures.    Accompanied by daughter, remains active and lives independently, denies any chest pain or dyspnea.  Continues to endorse elevated blood pressure.  Cardiac Studies relevent.    ECHOCARDIOGRAM COMPLETE 11/09/2020  1. Left ventricular ejection fraction, by estimation, is 55 to 60%. Left ventricular ejection fraction by 3D volume is 56 %. The left ventricle has normal function. The left ventricle has no regional wall motion abnormalities. There is mild left ventricular hypertrophy. Left ventricular diastolic parameters are consistent with Grade I diastolic dysfunction (impaired relaxation). There is the interventricular septum is flattened in systole, consistent with right ventricular pressure overload. 2. Right ventricular systolic function is mildly reduced. The right ventricular size is normal. There is moderately elevated pulmonary artery systolic pressure. The estimated right ventricular systolic pressure is 49.2 mmHg. 3.  Compared to 08/10/2020, PA pressure has decreased from 77 mmHg.    Discussed the use of AI scribe software for  clinical note transcription with the patient, who gave verbal consent to proceed.  History of Present Illness Lynn Brown is a 89 year old female with chronic leg edema and hypertension who presents for cardiovascular evaluation. She is accompanied by her daughter.  She experiences chronic leg edema, which occasionally causes a feeling of heaviness in her leg. She is concerned about elevated blood pressure and is currently taking metoprolol . She is on chronic anticoagulation therapy with Eliquis  due to pulmonary embolism and a primary hypercoagulable state. She has no chest pain, dyspnea, paroxysmal nocturnal dyspnea, or orthopnea.   Labs   Recent Labs    04/06/23 1626 07/09/23 1545  NA 139 140  K 4.9 4.3  CL 106 105  CO2 28 22  GLUCOSE 79 88  BUN 18 23  CREATININE 0.91 0.94  CALCIUM  10.0 9.3  GFRNONAA >60 58*    Lab Results  Component Value Date   ALT 14 04/06/2023   AST 17 04/06/2023   ALKPHOS 54 04/06/2023   BILITOT 0.7 04/06/2023      Latest Ref Rng & Units 07/09/2023    3:45 PM 04/06/2023    4:26 PM 02/17/2021   10:15 AM  CBC  WBC 4.0 - 10.5 K/uL 3.3  4.2  3.6   Hemoglobin 12.0 - 15.0 g/dL 85.2  85.9  87.0   Hematocrit 36.0 - 46.0 % 45.8  43.0  39.1   Platelets 150 - 400 K/uL 180  177  195     Care everywhere/Faxed External Labs:  NA  ROS  Review of Systems  Cardiovascular:  Positive for leg swelling (chronic). Negative for chest pain and dyspnea on exertion.   Physical Exam:   VS:  BP (!) 163/80   Pulse 73   Ht 5' 1 (1.549 m)   Wt 177 lb 9.6 oz (80.6 kg)   SpO2 94%   BMI 33.56 kg/m    Wt Readings from Last 3 Encounters:  02/27/24 177 lb 9.6 oz (80.6 kg)  10/18/23 175 lb (79.4 kg)  09/10/23 176 lb 3.2 oz (79.9 kg)    BP Readings from Last 3 Encounters:  02/27/24 (!) 163/80  10/18/23 122/74  09/10/23 112/68   Physical Exam Neck:     Vascular: No carotid bruit or JVD.  Cardiovascular:     Rate and Rhythm: Normal rate and regular rhythm.      Pulses:          Dorsalis pedis pulses are 0 on the right side and 0 on the left side.       Posterior tibial pulses are 0 on the right side and 0 on the left side.     Heart sounds: Normal heart sounds. No murmur heard.    No gallop.     Comments: Capillary fill time < 2 Sec Pulmonary:     Effort: Pulmonary effort is normal.     Breath sounds: Normal breath sounds.  Abdominal:     General: Bowel sounds are normal.     Palpations: Abdomen is soft.  Musculoskeletal:     Right lower leg: Edema (Chronic dermatitis changes from chronic venous stasis noted with 2+ pitting edema) present.     Left lower leg: Edema (Chronic dermatitis changes from chronic venous stasis noted with 2+ pitting edema) present.    EKG:         ASSESSMENT AND PLAN: .      ICD-10-CM   1. Primary hypertension  I10     2. Primary hypercoagulable state  D68.59     3. History of pulmonary embolism  Z86.711     4. Bilateral leg edema  R60.0     5. Coronary artery ectasia  I77.89       Assessment and Plan Assessment & Plan Chronic leg edema due to chronic venous stasis Chronic leg edema secondary to chronic venous stasis with occasional heaviness and chronic dermatitis changes. Two plus pitting edema present. - Advise keeping feet elevated - Instruct to use support stockings within 30-45 minutes of waking up  Essential hypertension Elevated blood pressure with current medication regimen including metoprolol , which will be discontinued. - Discontinue metoprolol  - Initiate labetalol  100 mg BID - Add isosorbide mononitrate 60 mg daily - Consider changing Lasix  to 20 mg daily if blood pressure is not well controlled - Consider adding hydralazine  50 mg BID if blood pressure is not well controlled  Primary thrombophilia with history of pulmonary embolism On chronic anticoagulation therapy due to primary hypercoagulable state and history of pulmonary embolism.  Coronary artery ectasia - No symptoms of  angina, has not had any recent hospitalizations, presently also on Eliquis , remotely had presented with NSTEMI due to presumed embolic phenomena.  Previously had presented with chest pain, in view of advanced age, single kidney, would not recommend any invasive procedures which patient also prefers not to have any invasive procedures done.  Medical therapy for coronary ectasia.   Follow up: 2 months with me or APP for Hypertension management  Signed,  Gordy Bergamo, MD, [facility] 02/27/2024, 3:45 PM [facility] [address] [place], [place] [address] Phone: [phone]. Fax:  [phone]

## 2024-02-27 NOTE — Patient Instructions (Addendum)
 Medication Instructions:  STOP Metoprolol  START Labetalol  100mg  Take 1 tablet twice a day  START Imdur (isosorbide) 60mg  Take 1 tablet once a day  *If you need a refill on your cardiac medications before your next appointment, please call your pharmacy*  Lab Work: None ordered If you have labs (blood work) drawn today and your tests are completely normal, you will receive your results only by: MyChart Message (if you have MyChart) OR A paper copy in the mail If you have any lab test that is abnormal or we need to change your treatment, we will call you to review the results.  Testing/Procedures: None ordered  Follow-Up: At [facility], you and your health needs are our priority.  As part of our continuing mission to provide you with exceptional heart care, our providers are all part of one team.  This team includes your primary Cardiologist (physician) and Advanced Practice Providers or APPs (Physician Assistants and Nurse Practitioners) who all work together to provide you with the care you need, when you need it.  Your next appointment:   2 month(s)  Provider:   Rosaline Bane, NP or ANY APP   We recommend signing up for the patient portal called MyChart.  Sign up information is provided on this After Visit Summary.  MyChart is used to connect with patients for Virtual Visits (Telemedicine).  Patients are able to view lab/test results, encounter notes, upcoming appointments, etc.  Non-urgent messages can be sent to your provider as well.   To learn more about what you can do with MyChart, go to [email or web address].   Other Instructions

## 2024-03-10 DIAGNOSIS — I2541 Coronary artery aneurysm: Secondary | ICD-10-CM | POA: Diagnosis not present

## 2024-03-10 DIAGNOSIS — N1831 Chronic kidney disease, stage 3a: Secondary | ICD-10-CM | POA: Diagnosis not present

## 2024-03-10 DIAGNOSIS — I2510 Atherosclerotic heart disease of native coronary artery without angina pectoris: Secondary | ICD-10-CM | POA: Diagnosis not present

## 2024-03-10 DIAGNOSIS — Z0000 Encounter for general adult medical examination without abnormal findings: Secondary | ICD-10-CM | POA: Diagnosis not present

## 2024-03-10 DIAGNOSIS — I5032 Chronic diastolic (congestive) heart failure: Secondary | ICD-10-CM | POA: Diagnosis not present

## 2024-03-10 DIAGNOSIS — H6121 Impacted cerumen, right ear: Secondary | ICD-10-CM | POA: Diagnosis not present

## 2024-03-10 DIAGNOSIS — I471 Supraventricular tachycardia, unspecified: Secondary | ICD-10-CM | POA: Diagnosis not present

## 2024-03-10 DIAGNOSIS — E785 Hyperlipidemia, unspecified: Secondary | ICD-10-CM | POA: Diagnosis not present

## 2024-03-10 DIAGNOSIS — I129 Hypertensive chronic kidney disease with stage 1 through stage 4 chronic kidney disease, or unspecified chronic kidney disease: Secondary | ICD-10-CM | POA: Diagnosis not present

## 2024-03-10 DIAGNOSIS — E039 Hypothyroidism, unspecified: Secondary | ICD-10-CM | POA: Diagnosis not present

## 2024-03-10 DIAGNOSIS — E559 Vitamin D deficiency, unspecified: Secondary | ICD-10-CM | POA: Diagnosis not present

## 2024-03-10 DIAGNOSIS — R7303 Prediabetes: Secondary | ICD-10-CM | POA: Diagnosis not present

## 2024-03-10 DIAGNOSIS — Z23 Encounter for immunization: Secondary | ICD-10-CM | POA: Diagnosis not present

## 2024-03-18 ENCOUNTER — Encounter: Payer: Self-pay | Admitting: Podiatry

## 2024-03-18 ENCOUNTER — Ambulatory Visit: Admitting: Podiatry

## 2024-03-18 DIAGNOSIS — B351 Tinea unguium: Secondary | ICD-10-CM | POA: Diagnosis not present

## 2024-03-18 DIAGNOSIS — I739 Peripheral vascular disease, unspecified: Secondary | ICD-10-CM

## 2024-03-18 DIAGNOSIS — M79674 Pain in right toe(s): Secondary | ICD-10-CM | POA: Diagnosis not present

## 2024-03-18 DIAGNOSIS — L84 Corns and callosities: Secondary | ICD-10-CM

## 2024-03-18 DIAGNOSIS — M79675 Pain in left toe(s): Secondary | ICD-10-CM

## 2024-03-26 NOTE — Progress Notes (Signed)
  Subjective:  Patient ID: Lynn Brown, female    DOB: 10-20-1931,  MRN: [id number]  Lynn Brown presents to clinic today for at risk foot care. Patient has h/o PAD and callus(es) left great toe and painful mycotic toenails that are difficult to trim. Painful toenails interfere with ambulation. Aggravating factors include wearing enclosed shoe gear. Pain is relieved with periodic professional debridement. Painful calluses are aggravated when weightbearing with and without shoegear. Pain is relieved with periodic professional debridement.  Chief Complaint  Patient presents with   RFC    RFC Non diabetic toenail trim. LOV with PCP 03/10/24.   New problem(s): None.   PCP is Teresa Channel, MD.  Allergies  Allergen Reactions   Trospium     Other Reaction(s): dry mouth, did not help    Review of Systems: Negative except as noted in the HPI.  Objective:  There were no vitals filed for this visit. Lynn Brown is a pleasant 89 y.o. female in NAD. AAO x 3.  Vascular Examination: CFT <3 seconds b/l. DP pulses 1/4 b/l. PT pulses 0/4 b/l. Digital hair absent. Skin temperature gradient warm to warm b/l. No pain with calf compression. No ischemia or gangrene. No cyanosis or clubbing noted b/l. No edema noted b/l LE.   Neurological Examination: Sensation grossly intact b/l with 10 gram monofilament. Vibratory sensation intact b/l.   Dermatological Examination: Pedal skin warm and supple b/l. No open wounds b/l. No interdigital macerations. Toenails 1-5 b/l thick, discolored, elongated with subungual debris and pain on dorsal palpation.  Hyperkeratotic lesion(s) medial IPJ of left great toe.  No erythema, no edema, no drainage, no fluctuance.  Musculoskeletal Examination: Normal muscle strength 5/5 to all lower extremity muscle groups bilaterally. HAV with bunion deformity noted b/l LE. Pes planus deformity noted bilateral LE.Lynn Brown No pain, crepitus or joint limitation noted with ROM b/l LE.   Utilizes cane for ambulation assistance.  Radiographs: None  Assessment/Plan: 1. Pain due to onychomycosis of toenails of both feet   2. Callus   3. PVD (peripheral vascular disease)    Patient was evaluated and treated. All patient's and/or POA's questions/concerns addressed on today's visit. Toenails 1-5 b/l debrided in length and girth without incident. Callus(es) medial IPJ of left great toe pared with sharp debridement without incident. Continue soft, supportive shoe gear daily. Report any pedal injuries to medical professional. Call office if there are any questions/concerns. -Patient/POA to call should there be question/concern in the interim.   Return in about 3 months (around 06/18/2024).  Lynn Brown, DPM      [place] LOCATION: [address], [place] [address]                   Office [phone]   [facility] LOCATION: [address] [place], [place] [address] Office [phone]

## 2024-04-29 ENCOUNTER — Other Ambulatory Visit: Payer: Self-pay | Admitting: Cardiology

## 2024-05-06 ENCOUNTER — Other Ambulatory Visit: Payer: Self-pay | Admitting: Cardiology

## 2024-05-31 NOTE — Progress Notes (Unsigned)
 " Cardiology Office Note:  .   Date:  06/01/2024  ID:  Lynn Brown, DOB [birth year], MRN [id number] PCP: Teresa Channel, MD  [facility] Providers Cardiologist:  Aleene Passe, MD (Inactive)   History of Present Illness: .   Lynn Brown is a 89 y.o. 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   On her last office visit due to chronic leg edema, advised her to use support stockings and also discontinued metoprolol  and switched her to labetalol  and added Imdur  60 mg daily she now presents specifically for management of hypertension after she established with me 3 months ago.  Patient is tolerating the medications without any side effects.  Except occasional episodes of elevated blood pressure, no other complaints today, no dizziness, no dyspnea, no leg edema.    Discussed the use of AI scribe software for clinical note transcription with the patient, who gave verbal consent to proceed.  History of Present Illness Lynn Brown is a 89 year old female with hypertension who presents with concerns about fluctuating blood pressure.  She has fluctuating blood pressure at home, ranging from 161/107 mmHg to 120/70 mmHg. Today it was 161/107 mmHg before the visit and 120/70 mmHg earlier this morning.  She takes labetalol  after a prior switch from metoprolol , Imdur  60 mg once daily, and Eliquis  for a blood clot. She has mild ankle edema.  Cardiac Studies relevent.    Cardiac Studies & Procedures   CARDIAC CATHETERIZATION 03/10/2018  Coronary artery ectasia involving the proximal and mid circumflex as well as the proximal to mid RCA.  The left circumflex appears to have eccentric globular  filling defect consistent with thrombus.  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  Both vessels have slow filling and slow flow.  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  Normal left ventricular function.  Supraventricular tachycardia 140 bpm upon entering the Cath Lab.  15 mg of IV metoprolol  was given and 5 mg aliquots over 15 minutes and led to resolution.  ECHOCARDIOGRAM COMPLETE 11/09/2020 1. Left ventricular ejection fraction, by estimation, is 55 to 60%. Left ventricular ejection fraction by 3D volume is 56 %. The left ventricle has normal function. The left ventricle has no regional wall motion abnormalities. There is mild left ventricular hypertrophy. Left ventricular diastolic parameters are consistent with Grade I diastolic dysfunction (impaired relaxation). There is the interventricular septum is flattened in systole, consistent with right ventricular pressure overload. 2. Right ventricular systolic function is mildly reduced. The right ventricular size is normal. There is moderately elevated pulmonary artery systolic pressure. The estimated right ventricular systolic pressure is 49.2 mmHg. 3. The mitral valve is abnormal. Trivial mitral valve regurgitation. 4. The aortic valve is tricuspid. Aortic valve regurgitation is not visualized.      Labs   Recent Labs    07/09/23 1545  NA 140  K 4.3  CL 105  CO2 22  GLUCOSE 88  BUN 23  CREATININE 0.94  CALCIUM  9.3  GFRNONAA 58*    Lab Results  Component Value Date   ALT 14 04/06/2023   AST  17 04/06/2023   ALKPHOS 54 04/06/2023   BILITOT 0.7 04/06/2023      Latest Ref Rng & Units 07/09/2023    3:45 PM 04/06/2023    4:26 PM 02/17/2021   10:15 AM  CBC  WBC 4.0 - 10.5 K/uL 3.3  4.2  3.6   Hemoglobin 12.0 - 15.0 g/dL 85.2  85.9  87.0   Hematocrit 36.0 - 46.0 %  45.8  43.0  39.1   Platelets 150 - 400 K/uL 180  177  195    ROS  Review of Systems  Cardiovascular:  Positive for leg swelling (chronic). Negative for chest pain and dyspnea on exertion.   Physical Exam:   VS:  BP 112/62 (BP Location: Left Arm, Patient Position: Sitting, Cuff Size: Large)   Pulse 61   Resp 16   Ht 5' 1 (1.549 m)   Wt 176 lb 11.2 oz (80.2 kg)   SpO2 95%   BMI 33.39 kg/m    Wt Readings from Last 3 Encounters:  06/01/24 176 lb 11.2 oz (80.2 kg)  02/27/24 177 lb 9.6 oz (80.6 kg)  10/18/23 175 lb (79.4 kg)    BP Readings from Last 3 Encounters:  06/01/24 112/62  02/27/24 (!) 163/80  10/18/23 122/74   Physical Exam Neck:     Vascular: No carotid bruit or JVD.  Cardiovascular:     Rate and Rhythm: Normal rate and regular rhythm.     Pulses: Intact distal pulses.     Heart sounds: Normal heart sounds. No murmur heard.    No gallop.  Pulmonary:     Effort: Pulmonary effort is normal.     Breath sounds: Normal breath sounds.  Abdominal:     General: Bowel sounds are normal.     Palpations: Abdomen is soft.  Musculoskeletal:     Right lower leg: Edema (1-2+ ankle edema) present.     Left lower leg: Edema (1-2+ ankle edema) present.    ASSESSMENT AND PLAN: .      ICD-10-CM   1. Primary hypertension  I10     2. Bilateral leg edema  R60.0     3. Primary hypercoagulable state  D68.59      Assessment & Plan Primary hypertension Blood pressure is well-controlled with current medication regimen. Recent readings include 161/107 mmHg and 120/70s mmHg. Blood pressure fluctuations are normal and not concerning unless persistent. Current medications include labetalol  and Imdur  60 mg daily, which have been effective. Advised against frequent blood pressure monitoring to prevent unnecessary anxiety and potential misinterpretation of readings. Emphasized that her brain has adapted to slightly higher blood pressure due to age and long-standing hypertension. -  Continue labetalol  and Imdur  60 mg daily. - Check blood pressure once a week. - Seek medical attention if blood pressure remains consistently high or low.  Bilateral leg edema Mild ankle edema present. Compression stockings recommended to manage edema. - Wear compression stockings within an hour of waking up.  Primary hypercoagulable state Currently managed with Eliquis . Advised against taking aspirin , Motrin , or Aleve due to anticoagulation therapy. - Continue Eliquis  as prescribed. - Avoid aspirin , Motrin , and Aleve.   Follow up: As needed for management of hypertension  Signed,  Gordy Bergamo, MD, [facility] 06/01/2024, 10:56 AM [facility] [address] [place], [place] [address] Phone: [phone]. Fax:  [phone]  "

## 2024-06-01 ENCOUNTER — Ambulatory Visit: Attending: Cardiology | Admitting: Cardiology

## 2024-06-01 ENCOUNTER — Encounter: Payer: Self-pay | Admitting: Cardiology

## 2024-06-01 VITALS — BP 112/62 | HR 61 | Resp 16 | Ht 61.0 in | Wt 176.7 lb

## 2024-06-01 DIAGNOSIS — I10 Essential (primary) hypertension: Secondary | ICD-10-CM

## 2024-06-01 DIAGNOSIS — R600 Localized edema: Secondary | ICD-10-CM

## 2024-06-01 DIAGNOSIS — D6859 Other primary thrombophilia: Secondary | ICD-10-CM

## 2024-06-01 NOTE — Patient Instructions (Addendum)
 Medication Instructions:  Your physician recommends that you continue on your current medications as directed. Please refer to the Current Medication list given to you today.  *If you need a refill on your cardiac medications before your next appointment, please call your pharmacy*  Follow-Up: At [facility], you and your health needs are our priority.  As part of our continuing mission to provide you with exceptional heart care, our providers are all part of one team.  This team includes your primary Cardiologist (physician) and Advanced Practice Providers or APPs (Physician Assistants and Nurse Practitioners) who all work together to provide you with the care you need, when you need it.  Your next appointment:    PRN (As Needed)   Provider:   Gordy Bergamo, MD        We recommend signing up for the patient portal called MyChart.  Patients are able to view lab/test results, encounter notes, upcoming appointments, etc.  Non-urgent messages can be sent to your provider as well, go to [email or web address].

## 2024-06-30 ENCOUNTER — Ambulatory Visit: Admitting: Podiatry

## 2024-06-30 ENCOUNTER — Encounter: Payer: Self-pay | Admitting: Podiatry

## 2024-06-30 DIAGNOSIS — L84 Corns and callosities: Secondary | ICD-10-CM

## 2024-06-30 DIAGNOSIS — D689 Coagulation defect, unspecified: Secondary | ICD-10-CM

## 2024-06-30 DIAGNOSIS — B351 Tinea unguium: Secondary | ICD-10-CM

## 2024-06-30 DIAGNOSIS — I739 Peripheral vascular disease, unspecified: Secondary | ICD-10-CM

## 2024-09-30 ENCOUNTER — Ambulatory Visit: Admitting: Podiatry
